# Patient Record
Sex: Female | Born: 1935 | Race: White | Hispanic: No | Smoking: Never smoker
Health system: Southern US, Community
[De-identification: ages and names within clinical notes are randomized; demographics above are authoritative.]

## PROBLEM LIST (undated history)

## (undated) DIAGNOSIS — I639 Cerebral infarction, unspecified: Secondary | ICD-10-CM

## (undated) DIAGNOSIS — E039 Hypothyroidism, unspecified: Secondary | ICD-10-CM

## (undated) DIAGNOSIS — M810 Age-related osteoporosis without current pathological fracture: Secondary | ICD-10-CM

## (undated) DIAGNOSIS — K635 Polyp of colon: Secondary | ICD-10-CM

## (undated) DIAGNOSIS — C50919 Malignant neoplasm of unspecified site of unspecified female breast: Secondary | ICD-10-CM

## (undated) DIAGNOSIS — I10 Essential (primary) hypertension: Secondary | ICD-10-CM

## (undated) DIAGNOSIS — K625 Hemorrhage of anus and rectum: Secondary | ICD-10-CM

## (undated) DIAGNOSIS — M1990 Unspecified osteoarthritis, unspecified site: Secondary | ICD-10-CM

## (undated) DIAGNOSIS — C4490 Unspecified malignant neoplasm of skin, unspecified: Secondary | ICD-10-CM

## (undated) HISTORY — DX: Age-related osteoporosis without current pathological fracture: M81.0

## (undated) HISTORY — PX: CHOLECYSTECTOMY: SHX55

## (undated) HISTORY — PX: MASTECTOMY: SHX3

## (undated) HISTORY — PX: OTHER SURGICAL HISTORY: SHX169

## (undated) HISTORY — PX: REPLACEMENT TOTAL KNEE: SUR1224

## (undated) HISTORY — PX: ABDOMINAL HYSTERECTOMY: SHX81

## (undated) HISTORY — PX: HIP SURGERY: SHX245

## (undated) HISTORY — DX: Unspecified osteoarthritis, unspecified site: M19.90

## (undated) HISTORY — PX: BILATERAL TOTAL MASTECTOMY WITH AXILLARY LYMPH NODE DISSECTION: SHX6364

## (undated) HISTORY — PX: JOINT REPLACEMENT: SHX530

## (undated) HISTORY — PX: CATARACT EXTRACTION: SUR2

## (undated) SURGERY — COLONOSCOPY
Anesthesia: Moderate Sedation | Laterality: Left

---

## 1995-03-18 DIAGNOSIS — C4491 Basal cell carcinoma of skin, unspecified: Secondary | ICD-10-CM

## 1995-03-18 HISTORY — DX: Basal cell carcinoma of skin, unspecified: C44.91

## 1997-09-23 ENCOUNTER — Ambulatory Visit (HOSPITAL_COMMUNITY): Admission: RE | Admit: 1997-09-23 | Discharge: 1997-09-23 | Payer: Self-pay | Admitting: Family Medicine

## 1997-10-27 ENCOUNTER — Ambulatory Visit (HOSPITAL_COMMUNITY): Admission: RE | Admit: 1997-10-27 | Discharge: 1997-10-27 | Payer: Self-pay | Admitting: Family Medicine

## 1999-01-16 ENCOUNTER — Other Ambulatory Visit: Admission: RE | Admit: 1999-01-16 | Discharge: 1999-01-16 | Payer: Self-pay | Admitting: Family Medicine

## 1999-01-31 ENCOUNTER — Ambulatory Visit (HOSPITAL_COMMUNITY): Admission: RE | Admit: 1999-01-31 | Discharge: 1999-01-31 | Payer: Self-pay | Admitting: Family Medicine

## 1999-01-31 ENCOUNTER — Encounter: Payer: Self-pay | Admitting: Family Medicine

## 1999-07-24 ENCOUNTER — Other Ambulatory Visit: Admission: RE | Admit: 1999-07-24 | Discharge: 1999-07-24 | Payer: Self-pay | Admitting: Family Medicine

## 1999-09-04 ENCOUNTER — Ambulatory Visit (HOSPITAL_COMMUNITY): Admission: RE | Admit: 1999-09-04 | Discharge: 1999-09-04 | Payer: Self-pay | Admitting: Urology

## 1999-09-04 ENCOUNTER — Encounter (INDEPENDENT_AMBULATORY_CARE_PROVIDER_SITE_OTHER): Payer: Self-pay

## 1999-10-10 ENCOUNTER — Ambulatory Visit (HOSPITAL_COMMUNITY): Admission: RE | Admit: 1999-10-10 | Discharge: 1999-10-10 | Payer: Self-pay | Admitting: Family Medicine

## 1999-10-10 ENCOUNTER — Encounter: Payer: Self-pay | Admitting: Family Medicine

## 2000-01-29 ENCOUNTER — Other Ambulatory Visit: Admission: RE | Admit: 2000-01-29 | Discharge: 2000-01-29 | Payer: Self-pay | Admitting: *Deleted

## 2000-03-21 ENCOUNTER — Other Ambulatory Visit: Admission: RE | Admit: 2000-03-21 | Discharge: 2000-03-21 | Payer: Self-pay | Admitting: Gynecology

## 2000-03-21 ENCOUNTER — Encounter (INDEPENDENT_AMBULATORY_CARE_PROVIDER_SITE_OTHER): Payer: Self-pay | Admitting: Specialist

## 2000-05-02 ENCOUNTER — Ambulatory Visit (HOSPITAL_COMMUNITY): Admission: RE | Admit: 2000-05-02 | Discharge: 2000-05-02 | Payer: Self-pay | Admitting: Gynecology

## 2000-05-02 ENCOUNTER — Encounter (INDEPENDENT_AMBULATORY_CARE_PROVIDER_SITE_OTHER): Payer: Self-pay

## 2000-05-13 ENCOUNTER — Other Ambulatory Visit: Admission: RE | Admit: 2000-05-13 | Discharge: 2000-05-13 | Payer: Self-pay | Admitting: Gynecology

## 2000-05-13 ENCOUNTER — Encounter (INDEPENDENT_AMBULATORY_CARE_PROVIDER_SITE_OTHER): Payer: Self-pay | Admitting: Specialist

## 2000-08-05 ENCOUNTER — Other Ambulatory Visit: Admission: RE | Admit: 2000-08-05 | Discharge: 2000-08-05 | Payer: Self-pay | Admitting: Gynecology

## 2000-11-04 ENCOUNTER — Other Ambulatory Visit: Admission: RE | Admit: 2000-11-04 | Discharge: 2000-11-04 | Payer: Self-pay | Admitting: Gynecology

## 2001-01-07 ENCOUNTER — Encounter: Payer: Self-pay | Admitting: Orthopedic Surgery

## 2001-01-07 ENCOUNTER — Encounter: Admission: RE | Admit: 2001-01-07 | Discharge: 2001-01-07 | Payer: Self-pay | Admitting: Orthopedic Surgery

## 2001-01-21 ENCOUNTER — Encounter: Admission: RE | Admit: 2001-01-21 | Discharge: 2001-01-21 | Payer: Self-pay | Admitting: Orthopedic Surgery

## 2001-01-21 ENCOUNTER — Encounter: Payer: Self-pay | Admitting: Orthopedic Surgery

## 2001-02-02 ENCOUNTER — Encounter: Admission: RE | Admit: 2001-02-02 | Discharge: 2001-02-02 | Payer: Self-pay | Admitting: Orthopedic Surgery

## 2001-02-02 ENCOUNTER — Encounter: Payer: Self-pay | Admitting: Orthopedic Surgery

## 2001-02-05 ENCOUNTER — Other Ambulatory Visit: Admission: RE | Admit: 2001-02-05 | Discharge: 2001-02-05 | Payer: Self-pay | Admitting: Gynecology

## 2001-05-08 ENCOUNTER — Other Ambulatory Visit: Admission: RE | Admit: 2001-05-08 | Discharge: 2001-05-08 | Payer: Self-pay | Admitting: Gynecology

## 2001-10-07 ENCOUNTER — Encounter: Admission: RE | Admit: 2001-10-07 | Discharge: 2001-10-07 | Payer: Self-pay | Admitting: Orthopedic Surgery

## 2001-10-07 ENCOUNTER — Encounter: Payer: Self-pay | Admitting: Orthopedic Surgery

## 2001-11-12 ENCOUNTER — Other Ambulatory Visit: Admission: RE | Admit: 2001-11-12 | Discharge: 2001-11-12 | Payer: Self-pay | Admitting: Gynecology

## 2001-11-17 ENCOUNTER — Encounter: Admission: RE | Admit: 2001-11-17 | Discharge: 2001-11-17 | Payer: Self-pay | Admitting: Gynecology

## 2001-11-17 ENCOUNTER — Encounter: Payer: Self-pay | Admitting: Gynecology

## 2002-02-19 ENCOUNTER — Encounter: Payer: Self-pay | Admitting: Ophthalmology

## 2002-02-23 ENCOUNTER — Ambulatory Visit (HOSPITAL_COMMUNITY): Admission: RE | Admit: 2002-02-23 | Discharge: 2002-02-23 | Payer: Self-pay | Admitting: Ophthalmology

## 2002-07-19 ENCOUNTER — Encounter: Admission: RE | Admit: 2002-07-19 | Discharge: 2002-07-19 | Payer: Self-pay | Admitting: Orthopedic Surgery

## 2002-07-19 ENCOUNTER — Encounter: Payer: Self-pay | Admitting: Orthopedic Surgery

## 2002-08-02 ENCOUNTER — Encounter: Admission: RE | Admit: 2002-08-02 | Discharge: 2002-08-02 | Payer: Self-pay | Admitting: Orthopedic Surgery

## 2002-08-02 ENCOUNTER — Encounter: Payer: Self-pay | Admitting: Neurosurgery

## 2002-08-23 ENCOUNTER — Encounter: Payer: Self-pay | Admitting: Orthopedic Surgery

## 2002-08-23 ENCOUNTER — Encounter: Admission: RE | Admit: 2002-08-23 | Discharge: 2002-08-23 | Payer: Self-pay | Admitting: Orthopedic Surgery

## 2002-09-23 DIAGNOSIS — D229 Melanocytic nevi, unspecified: Secondary | ICD-10-CM

## 2002-09-23 HISTORY — DX: Melanocytic nevi, unspecified: D22.9

## 2002-10-29 ENCOUNTER — Encounter: Payer: Self-pay | Admitting: Emergency Medicine

## 2002-10-29 ENCOUNTER — Emergency Department (HOSPITAL_COMMUNITY): Admission: EM | Admit: 2002-10-29 | Discharge: 2002-10-29 | Payer: Self-pay | Admitting: Emergency Medicine

## 2002-11-05 ENCOUNTER — Encounter: Payer: Self-pay | Admitting: Family Medicine

## 2002-11-05 ENCOUNTER — Ambulatory Visit (HOSPITAL_COMMUNITY): Admission: RE | Admit: 2002-11-05 | Discharge: 2002-11-05 | Payer: Self-pay | Admitting: Family Medicine

## 2002-11-15 ENCOUNTER — Other Ambulatory Visit: Admission: RE | Admit: 2002-11-15 | Discharge: 2002-11-15 | Payer: Self-pay | Admitting: Gynecology

## 2002-11-19 ENCOUNTER — Ambulatory Visit (HOSPITAL_COMMUNITY): Admission: RE | Admit: 2002-11-19 | Discharge: 2002-11-19 | Payer: Self-pay | Admitting: Family Medicine

## 2002-12-02 ENCOUNTER — Ambulatory Visit (HOSPITAL_COMMUNITY): Admission: RE | Admit: 2002-12-02 | Discharge: 2002-12-02 | Payer: Self-pay | Admitting: Family Medicine

## 2002-12-02 ENCOUNTER — Encounter: Payer: Self-pay | Admitting: Family Medicine

## 2003-01-05 ENCOUNTER — Encounter (INDEPENDENT_AMBULATORY_CARE_PROVIDER_SITE_OTHER): Payer: Self-pay | Admitting: Specialist

## 2003-01-05 ENCOUNTER — Observation Stay (HOSPITAL_COMMUNITY): Admission: RE | Admit: 2003-01-05 | Discharge: 2003-01-06 | Payer: Self-pay | Admitting: General Surgery

## 2003-01-05 ENCOUNTER — Encounter: Payer: Self-pay | Admitting: General Surgery

## 2003-03-23 ENCOUNTER — Encounter: Admission: RE | Admit: 2003-03-23 | Discharge: 2003-03-23 | Payer: Self-pay | Admitting: Orthopedic Surgery

## 2003-04-08 ENCOUNTER — Encounter: Admission: RE | Admit: 2003-04-08 | Discharge: 2003-04-08 | Payer: Self-pay | Admitting: Orthopedic Surgery

## 2003-05-11 ENCOUNTER — Encounter: Admission: RE | Admit: 2003-05-11 | Discharge: 2003-05-11 | Payer: Self-pay | Admitting: Orthopedic Surgery

## 2003-11-18 ENCOUNTER — Encounter: Admission: RE | Admit: 2003-11-18 | Discharge: 2003-11-18 | Payer: Self-pay | Admitting: Orthopedic Surgery

## 2003-12-02 ENCOUNTER — Encounter: Admission: RE | Admit: 2003-12-02 | Discharge: 2003-12-02 | Payer: Self-pay | Admitting: Orthopedic Surgery

## 2003-12-22 ENCOUNTER — Encounter: Admission: RE | Admit: 2003-12-22 | Discharge: 2003-12-22 | Payer: Self-pay | Admitting: Orthopedic Surgery

## 2004-03-06 DIAGNOSIS — C4492 Squamous cell carcinoma of skin, unspecified: Secondary | ICD-10-CM

## 2004-03-06 HISTORY — DX: Squamous cell carcinoma of skin, unspecified: C44.92

## 2004-07-02 ENCOUNTER — Encounter: Admission: RE | Admit: 2004-07-02 | Discharge: 2004-07-02 | Payer: Self-pay | Admitting: Orthopedic Surgery

## 2004-07-17 ENCOUNTER — Encounter: Admission: RE | Admit: 2004-07-17 | Discharge: 2004-07-17 | Payer: Self-pay | Admitting: Orthopedic Surgery

## 2004-08-03 ENCOUNTER — Encounter: Admission: RE | Admit: 2004-08-03 | Discharge: 2004-08-03 | Payer: Self-pay | Admitting: Orthopedic Surgery

## 2004-09-28 ENCOUNTER — Inpatient Hospital Stay (HOSPITAL_COMMUNITY): Admission: EM | Admit: 2004-09-28 | Discharge: 2004-10-08 | Payer: Self-pay | Admitting: Emergency Medicine

## 2004-09-28 ENCOUNTER — Ambulatory Visit: Payer: Self-pay | Admitting: Physical Medicine & Rehabilitation

## 2004-09-28 ENCOUNTER — Encounter: Admission: RE | Admit: 2004-09-28 | Discharge: 2004-09-28 | Payer: Self-pay | Admitting: Orthopedic Surgery

## 2005-01-22 ENCOUNTER — Other Ambulatory Visit: Admission: RE | Admit: 2005-01-22 | Discharge: 2005-01-22 | Payer: Self-pay | Admitting: Gynecology

## 2005-04-12 ENCOUNTER — Inpatient Hospital Stay (HOSPITAL_COMMUNITY): Admission: RE | Admit: 2005-04-12 | Discharge: 2005-04-16 | Payer: Self-pay | Admitting: Orthopedic Surgery

## 2005-04-12 ENCOUNTER — Ambulatory Visit: Payer: Self-pay | Admitting: Physical Medicine & Rehabilitation

## 2005-04-16 ENCOUNTER — Inpatient Hospital Stay
Admission: RE | Admit: 2005-04-16 | Discharge: 2005-04-23 | Payer: Self-pay | Admitting: Physical Medicine & Rehabilitation

## 2005-09-18 ENCOUNTER — Emergency Department (HOSPITAL_COMMUNITY): Admission: EM | Admit: 2005-09-18 | Discharge: 2005-09-19 | Payer: Self-pay | Admitting: Emergency Medicine

## 2006-01-27 ENCOUNTER — Other Ambulatory Visit: Admission: RE | Admit: 2006-01-27 | Discharge: 2006-01-27 | Payer: Self-pay | Admitting: Gynecology

## 2006-05-05 ENCOUNTER — Encounter: Admission: RE | Admit: 2006-05-05 | Discharge: 2006-05-05 | Payer: Self-pay | Admitting: Orthopedic Surgery

## 2006-05-28 ENCOUNTER — Encounter: Admission: RE | Admit: 2006-05-28 | Discharge: 2006-05-28 | Payer: Self-pay | Admitting: Orthopedic Surgery

## 2006-06-10 ENCOUNTER — Encounter: Admission: RE | Admit: 2006-06-10 | Discharge: 2006-06-10 | Payer: Self-pay | Admitting: Orthopedic Surgery

## 2006-12-18 ENCOUNTER — Encounter: Admission: RE | Admit: 2006-12-18 | Discharge: 2006-12-18 | Payer: Self-pay | Admitting: Orthopedic Surgery

## 2007-01-22 ENCOUNTER — Encounter: Admission: RE | Admit: 2007-01-22 | Discharge: 2007-01-22 | Payer: Self-pay | Admitting: Orthopedic Surgery

## 2007-02-06 ENCOUNTER — Encounter: Admission: RE | Admit: 2007-02-06 | Discharge: 2007-02-06 | Payer: Self-pay | Admitting: Orthopedic Surgery

## 2007-05-04 DIAGNOSIS — C4491 Basal cell carcinoma of skin, unspecified: Secondary | ICD-10-CM

## 2007-05-04 HISTORY — DX: Basal cell carcinoma of skin, unspecified: C44.91

## 2007-11-19 ENCOUNTER — Encounter: Admission: RE | Admit: 2007-11-19 | Discharge: 2007-11-19 | Payer: Self-pay | Admitting: Orthopedic Surgery

## 2007-12-08 ENCOUNTER — Encounter: Admission: RE | Admit: 2007-12-08 | Discharge: 2007-12-08 | Payer: Self-pay | Admitting: Orthopedic Surgery

## 2007-12-22 ENCOUNTER — Encounter: Admission: RE | Admit: 2007-12-22 | Discharge: 2007-12-22 | Payer: Self-pay | Admitting: Orthopedic Surgery

## 2008-01-26 ENCOUNTER — Emergency Department (HOSPITAL_COMMUNITY): Admission: EM | Admit: 2008-01-26 | Discharge: 2008-01-26 | Payer: Self-pay | Admitting: Emergency Medicine

## 2008-01-27 ENCOUNTER — Ambulatory Visit (HOSPITAL_COMMUNITY): Admission: RE | Admit: 2008-01-27 | Discharge: 2008-01-27 | Payer: Self-pay | Admitting: Emergency Medicine

## 2008-02-16 ENCOUNTER — Ambulatory Visit (HOSPITAL_COMMUNITY): Admission: RE | Admit: 2008-02-16 | Discharge: 2008-02-16 | Payer: Self-pay | Admitting: Orthopedic Surgery

## 2008-02-16 ENCOUNTER — Ambulatory Visit: Payer: Self-pay | Admitting: Surgery

## 2008-02-16 ENCOUNTER — Encounter (INDEPENDENT_AMBULATORY_CARE_PROVIDER_SITE_OTHER): Payer: Self-pay | Admitting: Orthopedic Surgery

## 2008-02-18 ENCOUNTER — Encounter: Admission: RE | Admit: 2008-02-18 | Discharge: 2008-02-18 | Payer: Self-pay | Admitting: Orthopedic Surgery

## 2008-06-14 ENCOUNTER — Encounter: Admission: RE | Admit: 2008-06-14 | Discharge: 2008-06-14 | Payer: Self-pay | Admitting: Orthopedic Surgery

## 2008-06-21 ENCOUNTER — Ambulatory Visit: Payer: Self-pay | Admitting: Vascular Surgery

## 2008-06-28 ENCOUNTER — Encounter: Admission: RE | Admit: 2008-06-28 | Discharge: 2008-06-28 | Payer: Self-pay | Admitting: Orthopedic Surgery

## 2008-07-18 ENCOUNTER — Encounter: Admission: RE | Admit: 2008-07-18 | Discharge: 2008-07-18 | Payer: Self-pay | Admitting: Orthopedic Surgery

## 2008-07-27 ENCOUNTER — Inpatient Hospital Stay (HOSPITAL_COMMUNITY): Admission: RE | Admit: 2008-07-27 | Discharge: 2008-08-01 | Payer: Self-pay | Admitting: Orthopedic Surgery

## 2008-08-01 ENCOUNTER — Inpatient Hospital Stay: Admission: RE | Admit: 2008-08-01 | Discharge: 2008-08-12 | Payer: Self-pay | Admitting: Internal Medicine

## 2008-12-16 ENCOUNTER — Encounter: Admission: RE | Admit: 2008-12-16 | Discharge: 2008-12-16 | Payer: Self-pay | Admitting: Orthopedic Surgery

## 2009-01-12 ENCOUNTER — Encounter: Admission: RE | Admit: 2009-01-12 | Discharge: 2009-01-12 | Payer: Self-pay | Admitting: Orthopedic Surgery

## 2009-01-27 ENCOUNTER — Encounter: Admission: RE | Admit: 2009-01-27 | Discharge: 2009-01-27 | Payer: Self-pay | Admitting: Orthopedic Surgery

## 2009-06-26 ENCOUNTER — Encounter: Admission: RE | Admit: 2009-06-26 | Discharge: 2009-06-26 | Payer: Self-pay | Admitting: Orthopedic Surgery

## 2009-07-18 ENCOUNTER — Encounter: Admission: RE | Admit: 2009-07-18 | Discharge: 2009-07-18 | Payer: Self-pay | Admitting: Orthopedic Surgery

## 2009-08-03 ENCOUNTER — Encounter: Admission: RE | Admit: 2009-08-03 | Discharge: 2009-08-03 | Payer: Self-pay | Admitting: Orthopedic Surgery

## 2009-11-19 ENCOUNTER — Ambulatory Visit (HOSPITAL_COMMUNITY): Admission: RE | Admit: 2009-11-19 | Discharge: 2009-11-19 | Payer: Self-pay | Admitting: Family Medicine

## 2009-11-20 ENCOUNTER — Ambulatory Visit: Payer: Self-pay | Admitting: Vascular Surgery

## 2009-11-20 ENCOUNTER — Encounter (INDEPENDENT_AMBULATORY_CARE_PROVIDER_SITE_OTHER): Payer: Self-pay | Admitting: Internal Medicine

## 2010-01-02 ENCOUNTER — Ambulatory Visit: Payer: Self-pay | Admitting: Thoracic Surgery

## 2010-01-04 ENCOUNTER — Ambulatory Visit (HOSPITAL_COMMUNITY): Admission: RE | Admit: 2010-01-04 | Discharge: 2010-01-04 | Payer: Self-pay | Admitting: Thoracic Surgery

## 2010-01-09 ENCOUNTER — Ambulatory Visit: Payer: Self-pay | Admitting: Thoracic Surgery

## 2010-02-01 ENCOUNTER — Ambulatory Visit: Payer: Self-pay | Admitting: Vascular Surgery

## 2010-04-12 ENCOUNTER — Inpatient Hospital Stay (HOSPITAL_COMMUNITY): Admission: EM | Admit: 2010-04-12 | Discharge: 2009-11-24 | Payer: Self-pay | Admitting: Emergency Medicine

## 2010-05-24 ENCOUNTER — Encounter
Admission: RE | Admit: 2010-05-24 | Discharge: 2010-05-24 | Payer: Self-pay | Source: Home / Self Care | Attending: Orthopedic Surgery | Admitting: Orthopedic Surgery

## 2010-05-26 ENCOUNTER — Other Ambulatory Visit: Payer: Self-pay | Admitting: Orthopedic Surgery

## 2010-05-26 DIAGNOSIS — M545 Low back pain, unspecified: Secondary | ICD-10-CM

## 2010-06-07 ENCOUNTER — Other Ambulatory Visit: Payer: Self-pay | Admitting: Orthopedic Surgery

## 2010-06-07 ENCOUNTER — Ambulatory Visit
Admission: RE | Admit: 2010-06-07 | Discharge: 2010-06-07 | Disposition: A | Discharge status: Home / Self Care | Payer: No Typology Code available for payment source | Source: Ambulatory Visit | Attending: Orthopedic Surgery | Admitting: Orthopedic Surgery

## 2010-06-07 DIAGNOSIS — M545 Low back pain, unspecified: Secondary | ICD-10-CM

## 2010-06-07 DIAGNOSIS — G8929 Other chronic pain: Secondary | ICD-10-CM

## 2010-06-21 ENCOUNTER — Ambulatory Visit
Admission: RE | Admit: 2010-06-21 | Discharge: 2010-06-21 | Disposition: A | Discharge status: Home / Self Care | Payer: No Typology Code available for payment source | Source: Ambulatory Visit | Attending: Orthopedic Surgery | Admitting: Orthopedic Surgery

## 2010-06-21 DIAGNOSIS — M545 Low back pain, unspecified: Secondary | ICD-10-CM

## 2010-06-21 DIAGNOSIS — G8929 Other chronic pain: Secondary | ICD-10-CM

## 2010-06-27 ENCOUNTER — Other Ambulatory Visit: Payer: Self-pay | Admitting: Thoracic Surgery

## 2010-06-27 DIAGNOSIS — R911 Solitary pulmonary nodule: Secondary | ICD-10-CM

## 2010-07-06 ENCOUNTER — Emergency Department (HOSPITAL_COMMUNITY): Payer: Medicare Other

## 2010-07-06 ENCOUNTER — Observation Stay (HOSPITAL_COMMUNITY)
Admission: EM | Admit: 2010-07-06 | Discharge: 2010-07-07 | DRG: 999 | Disposition: A | Discharge status: Home / Self Care | Payer: Medicare Other | Attending: Internal Medicine | Admitting: Internal Medicine

## 2010-07-06 DIAGNOSIS — Z86711 Personal history of pulmonary embolism: Secondary | ICD-10-CM | POA: Insufficient documentation

## 2010-07-06 DIAGNOSIS — R82998 Other abnormal findings in urine: Secondary | ICD-10-CM | POA: Insufficient documentation

## 2010-07-06 DIAGNOSIS — M4120 Other idiopathic scoliosis, site unspecified: Secondary | ICD-10-CM | POA: Insufficient documentation

## 2010-07-06 DIAGNOSIS — E785 Hyperlipidemia, unspecified: Secondary | ICD-10-CM | POA: Insufficient documentation

## 2010-07-06 DIAGNOSIS — G894 Chronic pain syndrome: Secondary | ICD-10-CM | POA: Insufficient documentation

## 2010-07-06 DIAGNOSIS — E039 Hypothyroidism, unspecified: Secondary | ICD-10-CM | POA: Insufficient documentation

## 2010-07-06 DIAGNOSIS — I10 Essential (primary) hypertension: Secondary | ICD-10-CM | POA: Insufficient documentation

## 2010-07-06 DIAGNOSIS — Z86718 Personal history of other venous thrombosis and embolism: Secondary | ICD-10-CM | POA: Insufficient documentation

## 2010-07-06 DIAGNOSIS — Z96649 Presence of unspecified artificial hip joint: Secondary | ICD-10-CM | POA: Insufficient documentation

## 2010-07-06 DIAGNOSIS — R0789 Other chest pain: Principal | ICD-10-CM | POA: Insufficient documentation

## 2010-07-06 LAB — URINE MICROSCOPIC-ADD ON

## 2010-07-06 LAB — POCT CARDIAC MARKERS
CKMB, poc: 2.3 ng/mL (ref 1.0–8.0)
Myoglobin, poc: 243 ng/mL (ref 12–200)
Troponin i, poc: <0.05 ng/mL (ref 0.00–0.09)

## 2010-07-06 LAB — CBC
HCT: 42.4 % (ref 36.0–46.0)
Hemoglobin: 14 g/dL (ref 12.0–15.0)
MCH: 30.7 pg (ref 26.0–34.0)
MCHC: 33 g/dL (ref 30.0–36.0)
MCV: 93 fL (ref 78.0–100.0)
Platelets: 174 10*3/uL (ref 150–400)
RBC: 4.56 MIL/uL (ref 3.87–5.11)
RDW: 14.1 % (ref 11.5–15.5)
WBC: 9.8 10*3/uL (ref 4.0–10.5)

## 2010-07-06 LAB — DIFFERENTIAL
Basophils Absolute: 0 10*3/uL (ref 0.0–0.1)
Basophils Relative: 0 % (ref 0–1)
Eosinophils Absolute: 0 10*3/uL (ref 0.0–0.7)
Eosinophils Relative: 0 % (ref 0–5)
Lymphocytes Relative: 14 % (ref 12–46)
Lymphs Abs: 1.4 10*3/uL (ref 0.7–4.0)
Monocytes Absolute: 0.9 10*3/uL (ref 0.1–1.0)
Monocytes Relative: 9 % (ref 3–12)
Neutro Abs: 7.5 10*3/uL (ref 1.7–7.7)
Neutrophils Relative %: 77 % (ref 43–77)

## 2010-07-06 LAB — URINALYSIS, ROUTINE W REFLEX MICROSCOPIC
Bilirubin Urine: NEGATIVE
Glucose, UA: NEGATIVE mg/dL
Hgb urine dipstick: NEGATIVE
Ketones, ur: NEGATIVE mg/dL
Nitrite: NEGATIVE
Protein, ur: NEGATIVE mg/dL
Specific Gravity, Urine: 1.017 (ref 1.005–1.030)
Urobilinogen, UA: 0.2 mg/dL (ref 0.0–1.0)
pH: 6 (ref 5.0–8.0)

## 2010-07-06 LAB — BASIC METABOLIC PANEL
BUN: 25 mg/dL — ABNORMAL HIGH (ref 6–23)
CO2: 27 mEq/L (ref 19–32)
Calcium: 9.7 mg/dL (ref 8.4–10.5)
Chloride: 104 mEq/L (ref 96–112)
Creatinine, Ser: 1.35 mg/dL — ABNORMAL HIGH (ref 0.4–1.2)
GFR calc Af Amer: 46 mL/min — ABNORMAL LOW (ref >60)
GFR calc non Af Amer: 38 mL/min — ABNORMAL LOW (ref >60)
Glucose, Bld: 143 mg/dL — ABNORMAL HIGH (ref 70–99)
Potassium: 4 mEq/L (ref 3.5–5.1)
Sodium: 140 mEq/L (ref 135–145)

## 2010-07-06 LAB — CK TOTAL AND CKMB (NOT AT ARMC)
CK, MB: 2.1 ng/mL (ref 0.3–4.0)
Relative Index: 2.1 (ref 0.0–2.5)
Total CK: 100 U/L (ref 7–177)

## 2010-07-06 LAB — TROPONIN I: Troponin I: 0.01 ng/mL (ref 0.00–0.06)

## 2010-07-06 MED ORDER — TECHNETIUM TO 99M ALBUMIN AGGREGATED
6.0000 | Freq: Once | INTRAVENOUS | Status: AC | PRN
  Administered 2010-07-06: 6.4 via INTRAVENOUS

## 2010-07-06 MED ORDER — XENON XE 133 GAS
10.0000 | GAS_FOR_INHALATION | Freq: Once | RESPIRATORY_TRACT | Status: AC | PRN
  Administered 2010-07-06: 10.6 via RESPIRATORY_TRACT

## 2010-07-07 LAB — TSH: TSH: 1.474 u[IU]/mL (ref 0.350–4.500)

## 2010-07-07 LAB — BASIC METABOLIC PANEL
BUN: 23 mg/dL (ref 6–23)
CO2: 25 mEq/L (ref 19–32)
Calcium: 8.9 mg/dL (ref 8.4–10.5)
Chloride: 107 mEq/L (ref 96–112)
Creatinine, Ser: 1.09 mg/dL (ref 0.4–1.2)
GFR calc Af Amer: 58 mL/min — ABNORMAL LOW (ref >60)
GFR calc non Af Amer: 48 mL/min — ABNORMAL LOW (ref >60)
Glucose, Bld: 136 mg/dL — ABNORMAL HIGH (ref 70–99)
Potassium: 4.1 mEq/L (ref 3.5–5.1)
Sodium: 140 mEq/L (ref 135–145)

## 2010-07-07 LAB — HEMOGLOBIN A1C
Hgb A1c MFr Bld: 6.9 % — ABNORMAL HIGH (ref <5.7)
Mean Plasma Glucose: 151 mg/dL — ABNORMAL HIGH (ref <117)

## 2010-07-07 LAB — CBC
HCT: 39.1 % (ref 36.0–46.0)
Hemoglobin: 12.5 g/dL (ref 12.0–15.0)
MCH: 30.1 pg (ref 26.0–34.0)
MCHC: 32 g/dL (ref 30.0–36.0)
MCV: 94.2 fL (ref 78.0–100.0)
Platelets: 174 10*3/uL (ref 150–400)
RBC: 4.15 MIL/uL (ref 3.87–5.11)
RDW: 14.2 % (ref 11.5–15.5)
WBC: 8.6 10*3/uL (ref 4.0–10.5)

## 2010-07-07 LAB — LIPID PANEL
Cholesterol: 192 mg/dL (ref 0–200)
HDL: 102 mg/dL (ref >39)
LDL Cholesterol: 79 mg/dL (ref 0–99)
Total CHOL/HDL Ratio: 1.9 RATIO
Triglycerides: 54 mg/dL (ref <150)
VLDL: 11 mg/dL (ref 0–40)

## 2010-07-07 LAB — CARDIAC PANEL(CRET KIN+CKTOT+MB+TROPI)
CK, MB: 1.7 ng/mL (ref 0.3–4.0)
Relative Index: INVALID (ref 0.0–2.5)
Total CK: 90 U/L (ref 7–177)
Troponin I: <0.01 ng/mL (ref 0.00–0.06)

## 2010-07-07 NOTE — Discharge Summary (Signed)
NAME:  Tiffany [name], Tiffany [name] NO.:  [id number]  MEDICAL RECORD NO.:  [id number]           PATIENT TYPE:  I  LOCATION:  3733                         FACILITY:  [facility]  PHYSICIAN:  Talmage Nap, MD  DATE OF BIRTH:  1926/07/22  DATE OF ADMISSION:  07/06/2010 DATE OF DISCHARGE:  07/07/2010                        DISCHARGE SUMMARY - REFERRING   PRIMARY CARE PHYSICIAN:  Deatra James, MD  DISCHARGE DIAGNOSES: 1. Atypical chest pain, negative cardiac enzyme.  V/Q scan showed low     probability for pulmonary embolism. 2. Hypertension. 3. History of deep venous thrombosis. 4. History of right lower lobe acute pulmonary embolism. 5. Hypothyroidism. 6. Chronic pain syndrome. 7. History of arthritis of the right knee. 8. Asymptomatic bacteriuria. 9. History of fibrocystic breast disease. 10.Scoliosis. 11.History of bronchitis.  The patient is an 77 year old Caucasian female who was admitted to the hospital on July 06, 2010 by Dr. Thad Ranger with atypical chest pain. Pain was said to have started on the left side of the back then radiating to the chest with no associated nausea or vomiting.  No diaphoresis.  No palpitations.  No shortness of breath.  At the time the patient was seen by the admitting physician, pain was said to have resolved.  She was however admitted to rule out for acute coronary syndrome.  Her preadmission meds include: 1. Synthroid 75 mcg p.o. daily. 2. Percocet 5/325 one to two tablets p.o. q.6 p.r.n. 3. Metoprolol 50 mg 1 p.o. b.i.d. 4. Calcium carbonate 1200 mg 1 p.o. daily. 5. Avapro (irbesartan) 300 mg 1 p.o. daily.  ALLERGIES:  SULFA, MORPHINE SULFATE, CODEINE, IBUPROFEN and ADHESIVES.  PAST SURGICAL HISTORY: 1. Left hip replacement with subsequent 4 revisions. 2. Cholecystectomy. 3. Right total knee arthroplasty. 4. Hysterectomy.  SOCIAL/FAMILY HISTORY:  Not documented.  REVIEW OF SYSTEMS:  Essentially documented in initial history  and physical at the time patient was seen by the admitting physician.  PHYSICAL EXAMINATION:  VITAL SIGNS:  Blood pressure is 149/84, pulse 90, respiratory rate 20, temperature is 97.7. GENERAL:  She was said to be alert, oriented, not in any respiratory distress. HEENT:  Pupils equal and reactive to light and extraocular muscles are intact. NECK:  No jugular venous distention.  No carotid bruits.  No lymphadenopathy. CHEST:  Clear to auscultation.  Heart sounds are 1 and 2. ABDOMEN:  Soft, nontender.  Liver, spleen, kidney not palpable.  Bowel sounds are positive.  EXTREMITIES:  No pedal edema. NEUROLOGIC:  Nonfocal. MUSCULOSKELETAL SYSTEM:  Arthritic changes in the knees and feet. NEUROPSYCHIATRIC:  Unremarkable.  LABORATORY DATA:  Initial complete blood count with differential showed WBC of 9.8, hemoglobin of 14.0, hematocrit of 42.4, MCV of 93.0 with a platelet count of 174, normal differentials.  Cardiac markers:  Troponin I less than 0.05, 0.01, and less than 0.01 respectively.  Basic metabolic panel showed sodium of 140, potassium of 4.0, chloride of 104, with a bicarb of 27, glucose is 143, BUN is 25, creatinine is 1.35. Urinalysis showed moderate leukocyte esterase with negative nitrite. Urine microscopy showed wbc 11-20 with few bacteria, hemoglobin A1c 6.9. Thyroid panel TSH 1.474 normal.  Lipid panel  essentially normal.  Repeat complete blood count with no differential done on July 07, 2010 showed WBC of 8.6, hemoglobin of 12.5, hematocrit 39.1, MCV of 94.2 with a platelet count of 174.  Basic metabolic panel done on July 07, 2010 showed sodium of 140, potassium of 4.1, chloride of 107, bicarb of 25, glucose is 136, BUN is 23, and creatinine is 1.09.  EKG on admission was said to have showed normal sinus rhythm with a rate of 76, no significant ST-wave change.  Imaging studies done include chest x-ray, essentially normal.  V/Q scan showed low probability for  PE.  HOSPITAL COURSE:  The patient was admitted to telemetry.  She was given Avapro 300 mg p.o. daily, Lopressor 50 mg p.o. b.i.d. to be held if systolic blood pressure is less than 100 and also started on Synthroid 75 mcg p.o. daily.  Other medication given to the patient included aspirin 81 mg p.o. daily, Tylenol, oxycodone for pain control, and Zofran IV for nausea.  The patient was also given Lovenox 30 mg subcu q.24 for DVT prophylaxis.  The patient was seen by me but the very first time this admission on July 07, 2010, denied any chest pain or shortness of breath.  No abdominal discomfort.  No dysuria, hematuria, and examination of the patient was essentially unremarkable.  Her vital signs:  Blood pressure is 133/71, temperature is 98.4, pulse 82, respiratory rate 16, medically stable.  The patient is to be discharged home today on activity as tolerated. Low-sodium, low-cholesterol diet.  Follow up with her primary care physician in 1-2 weeks.  Medication to be taken at home will include: 1. Aspirin 81 mg p.o. daily. 2. Levaquin 500 mg 1 p.o. daily for 3 days for treatment of     asymptomatic bacteriuria. 3. Nitro 0.4 mg sublingual p.r.n. for chest pain. 4. Avapro (irbesartan) 300 mg 1 p.o. daily. 5. Calcium carbonate 1200 mg one p.o. daily. 6. Metoprolol tartrate 50 mg 1 p.o. b.i.d. 7. Percocet 5/325 one to two tablets p.o. q.6 p.r.n. 8. Synthroid (levothyroxine) 75 mcg 1 p.o. daily.     Talmage Nap, MD     CN/MEDQ  D:  07/07/2010  T:  07/07/2010  Job:  [id number]  cc:   Deatra James, M.D.  Electronically Signed by Talmage Nap  on 07/07/2010 11:58:22 AM

## 2010-07-08 LAB — URINE CULTURE
Colony Count: 75000
Culture  Setup Time: 201203022220

## 2010-07-09 ENCOUNTER — Other Ambulatory Visit: Payer: Self-pay | Admitting: Obstetrics

## 2010-07-18 NOTE — H&P (Signed)
NAME:  Tiffany [name], Tiffany [name] NO.:  [id number]  MEDICAL RECORD NO.:  [id number]           PATIENT TYPE:  E  LOCATION:  [facility]                         FACILITY:  [facility]  PHYSICIAN:  Thad Ranger, MD       DATE OF BIRTH:  [birth year]  DATE OF ADMISSION:  07/06/2010 DATE OF DISCHARGE:                             HISTORY & PHYSICAL   PRIMARY CARE PHYSICIAN:  Deatra James, MD  CHIEF COMPLAINT:  Chest pain.  HISTORY OF PRESENT ILLNESS:  Tiffany [name] is an 77 year old female with history of right lung PE and DVT in July 2011, history of hypothyroidism, hypertension, history of osteoarthritis, who presented to the [facility] Emergency Room with chief complaint of back pain radiating to the chest.  History was provided by the patient.  The patient stated that she woke up with left-sided back pain which was radiating to the chest this morning and she had no nausea or vomiting, any diaphoresis or palpitations or any shortness of breath.  The patient stated that it came in spasms and resolved on its own.  The patient got very concerned because she had similar symptoms in July 2011 when she was diagnosed with right lung pulmonary embolism.  The patient stated that she went to [facility] just for the quick evaluation, although her primary care physician is Dr. Deatra James.  The patient stated that they did chest x-ray and EKG which was fine but felt that she needed to be evaluated for chest pain.  At the time of my encounter, chest pain had completely resolved.  The patient was admitted for evaluation.  Otherwise, at this time she denies any other complaints; however, she has some headache though while being in the emergency room.  PAST MEDICAL HISTORY: 1. History of hypertension. 2. History of left lower extremity DVT in July 2011. 3. History of right lower lobe acute pulmonary embolism in July 2011.     The patient stated that she finished her Coumadin after 6 months on  May 08, 2010. 4. Hypothyroidism. 5. Chronic pain syndrome. 6. Osteoarthritis of the right knee. 7. Hypertension. 8. History of fibrocystic breast disease. 9. Scoliosis. 10.History of bronchitis. 11.Dyslipidemia.  SURGERIES: 1. Left hip replacement surgery with four revisions. 2. Cholecystectomy. 3. Right total knee arthroplasty. 4. History of hysterectomy.  ALLERGIES:  SULFA, MORPHINE, CODEINE, IBUPROFEN, and ADHESIVE.  MEDICATIONS PRIOR TO ADMISSION: 1. Avapro 300 mg p.o. q.a.m. 2. Lomotil p.r.n. 3. Oxycodone/acetaminophen p.o. q.a.m. 4. Synthroid 75 mcg p.o. q.a.m. 5. Lopressor 50 mg p.o. b.i.d.  PHYSICAL EXAMINATION:  VITAL SIGNS:  Blood pressure 149/84, pulse rate 90, respiratory rate 20, temperature 97.7. GENERAL: The patient is alert, awake, and oriented x3, not in acute distress. HEENT:  Anicteric sclerae.  Pink conjunctivae.  Pupils are reactive to light and accommodation.  EOMI. NECK:  Supple.  No lymphopathy.  No JVD. CVS:  S1 and S2 clear.  Regular rate and rhythm. CHEST:  Fairly clear to auscultation bilaterally.  No chest pain currently. ABDOMEN:  Soft, nontender, nondistended.  Normal bowel sounds. EXTREMITIES:  No cyanosis, clubbing, or edema.  Well-healed scar on the right knee.  NEUROLOGIC:  No focal neurological deficits noted.  LABORATORY/DIAGNOSTIC DATA:  BMET, sodium 140, potassium 4.0, glucose 143, BUN 25, creatinine 1.35, calcium 9.7.  Cardiac markers, myoglobin 243, troponin less than 0.05.  CBC, white count 9.8, hemoglobin 14.0, hematocrit 42.4, platelets of 174,000.  RADIOLOGICAL DATA:  Chest x-ray two-view on July 06, 2010, chronic changes, no active disease.  VQ scan, July 06, 2010, very low probability of pulmonary embolism.  EKG, rate 76, normal sinus rhythm. No significant ST-T wave changes.  IMPRESSION AND PLAN:  Tiffany [name] is an 77 year old female with a history of prior pulmonary embolus and deep vein thrombosis, hypertension,  hypothyroidism, off the Coumadin since June 04, 2010, presents with atypical chest pain, currently resolved. 1. Atypical chest pain.  Differential diagnosis include cardiac versus     costochondritis or musculoskeletal.  Pulmonary embolism already has     been ruled out by negative VQ scan.  The patient was admitted for     observation on the telemonitor floor and ruled out for acute ACS.     Obtain 2-D echocardiogram for further workup. 2. Hypotension.  Restart Avapro and metoprolol per her home regimen. 3. Hyperglycemia.  Obtain HbA1c to assess if the patient has diabetes. 4. Hyperthyroidism.  Obtain TSH and continue Synthroid. 5. Chronic pain syndrome.  Continue pain medications as needed.  The     patient wants physical therapy evaluation     as she has multiple revisions of her hip replacement and has been     having some difficulty ambulating.  We will place PT/OT consult for     the patient. 6. Prophylaxis.  Lovenox for DVT prophylaxis.     Thad Ranger, MD     RR/MEDQ  D:  07/06/2010  T:  07/06/2010  Job:  [id number]  cc:   Deatra James, M.D.  Electronically Signed by Bonney Berres  on 07/18/2010 07:52:41 AM

## 2010-07-19 LAB — GLUCOSE, CAPILLARY: Glucose-Capillary: 126 mg/dL — ABNORMAL HIGH (ref 70–99)

## 2010-07-21 LAB — COMPREHENSIVE METABOLIC PANEL
ALT: 13 U/L (ref 0–35)
AST: 16 U/L (ref 0–37)
Albumin: 3 g/dL — ABNORMAL LOW (ref 3.5–5.2)
Alkaline Phosphatase: 66 U/L (ref 39–117)
BUN: 12 mg/dL (ref 6–23)
CO2: 26 mEq/L (ref 19–32)
Calcium: 8.7 mg/dL (ref 8.4–10.5)
Chloride: 105 mEq/L (ref 96–112)
Creatinine, Ser: 1.15 mg/dL (ref 0.4–1.2)
GFR calc Af Amer: 55 mL/min — ABNORMAL LOW (ref >60)
GFR calc non Af Amer: 45 mL/min — ABNORMAL LOW (ref >60)
Glucose, Bld: 133 mg/dL — ABNORMAL HIGH (ref 70–99)
Potassium: 3.9 mEq/L (ref 3.5–5.1)
Sodium: 136 mEq/L (ref 135–145)
Total Bilirubin: 1.3 mg/dL — ABNORMAL HIGH (ref 0.3–1.2)
Total Protein: 5.5 g/dL — ABNORMAL LOW (ref 6.0–8.3)

## 2010-07-21 LAB — DIFFERENTIAL
Basophils Absolute: 0 10*3/uL (ref 0.0–0.1)
Basophils Relative: 0 % (ref 0–1)
Eosinophils Absolute: 0 10*3/uL (ref 0.0–0.7)
Eosinophils Relative: 0 % (ref 0–5)
Lymphocytes Relative: 13 % (ref 12–46)
Lymphs Abs: 1.2 10*3/uL (ref 0.7–4.0)
Monocytes Absolute: 0.9 10*3/uL (ref 0.1–1.0)
Monocytes Relative: 9 % (ref 3–12)
Neutro Abs: 7.2 10*3/uL (ref 1.7–7.7)
Neutrophils Relative %: 78 % — ABNORMAL HIGH (ref 43–77)

## 2010-07-21 LAB — BASIC METABOLIC PANEL
BUN: 13 mg/dL (ref 6–23)
BUN: 14 mg/dL (ref 6–23)
BUN: 14 mg/dL (ref 6–23)
CO2: 24 mEq/L (ref 19–32)
CO2: 24 mEq/L (ref 19–32)
CO2: 25 mEq/L (ref 19–32)
Calcium: 8.7 mg/dL (ref 8.4–10.5)
Calcium: 8.8 mg/dL (ref 8.4–10.5)
Calcium: 8.8 mg/dL (ref 8.4–10.5)
Chloride: 111 mEq/L (ref 96–112)
Chloride: 111 mEq/L (ref 96–112)
Chloride: 114 mEq/L — ABNORMAL HIGH (ref 96–112)
Creatinine, Ser: 1.08 mg/dL (ref 0.4–1.2)
Creatinine, Ser: 1.18 mg/dL (ref 0.4–1.2)
Creatinine, Ser: 1.22 mg/dL — ABNORMAL HIGH (ref 0.4–1.2)
GFR calc Af Amer: 51 mL/min — ABNORMAL LOW (ref >60)
GFR calc Af Amer: 53 mL/min — ABNORMAL LOW (ref >60)
GFR calc Af Amer: 59 mL/min — ABNORMAL LOW (ref >60)
GFR calc non Af Amer: 42 mL/min — ABNORMAL LOW (ref >60)
GFR calc non Af Amer: 44 mL/min — ABNORMAL LOW (ref >60)
GFR calc non Af Amer: 49 mL/min — ABNORMAL LOW (ref >60)
Glucose, Bld: 110 mg/dL — ABNORMAL HIGH (ref 70–99)
Glucose, Bld: 112 mg/dL — ABNORMAL HIGH (ref 70–99)
Glucose, Bld: 114 mg/dL — ABNORMAL HIGH (ref 70–99)
Potassium: 3.9 mEq/L (ref 3.5–5.1)
Potassium: 4 mEq/L (ref 3.5–5.1)
Potassium: 4 mEq/L (ref 3.5–5.1)
Sodium: 141 mEq/L (ref 135–145)
Sodium: 142 mEq/L (ref 135–145)
Sodium: 144 mEq/L (ref 135–145)

## 2010-07-21 LAB — POCT I-STAT, CHEM 8
BUN: 16 mg/dL (ref 6–23)
Calcium, Ion: 1.11 mmol/L — ABNORMAL LOW (ref 1.12–1.32)
Chloride: 103 mEq/L (ref 96–112)
Creatinine, Ser: 1.2 mg/dL (ref 0.4–1.2)
Glucose, Bld: 141 mg/dL — ABNORMAL HIGH (ref 70–99)
HCT: 40 % (ref 36.0–46.0)
Hemoglobin: 13.6 g/dL (ref 12.0–15.0)
Potassium: 4 mEq/L (ref 3.5–5.1)
Sodium: 136 mEq/L (ref 135–145)
TCO2: 24 mmol/L (ref 0–100)

## 2010-07-21 LAB — CBC
HCT: 34.5 % — ABNORMAL LOW (ref 36.0–46.0)
HCT: 36.9 % (ref 36.0–46.0)
HCT: 38.2 % (ref 36.0–46.0)
Hemoglobin: 11.8 g/dL — ABNORMAL LOW (ref 12.0–15.0)
Hemoglobin: 12.3 g/dL (ref 12.0–15.0)
Hemoglobin: 13 g/dL (ref 12.0–15.0)
MCH: 30.9 pg (ref 26.0–34.0)
MCH: 31.4 pg (ref 26.0–34.0)
MCH: 31.4 pg (ref 26.0–34.0)
MCHC: 33.4 g/dL (ref 30.0–36.0)
MCHC: 34 g/dL (ref 30.0–36.0)
MCHC: 34 g/dL (ref 30.0–36.0)
MCV: 92.2 fL (ref 78.0–100.0)
MCV: 92.3 fL (ref 78.0–100.0)
MCV: 92.5 fL (ref 78.0–100.0)
Platelets: 140 10*3/uL — ABNORMAL LOW (ref 150–400)
Platelets: 145 10*3/uL — ABNORMAL LOW (ref 150–400)
Platelets: 156 10*3/uL (ref 150–400)
RBC: 3.74 MIL/uL — ABNORMAL LOW (ref 3.87–5.11)
RBC: 3.99 MIL/uL (ref 3.87–5.11)
RBC: 4.13 MIL/uL (ref 3.87–5.11)
RDW: 12.9 % (ref 11.5–15.5)
RDW: 12.9 % (ref 11.5–15.5)
RDW: 13 % (ref 11.5–15.5)
WBC: 7.2 10*3/uL (ref 4.0–10.5)
WBC: 9 10*3/uL (ref 4.0–10.5)
WBC: 9.3 10*3/uL (ref 4.0–10.5)

## 2010-07-21 LAB — APTT
aPTT: 28 seconds (ref 24–37)
aPTT: 41 seconds — ABNORMAL HIGH (ref 24–37)
aPTT: 42 seconds — ABNORMAL HIGH (ref 24–37)
aPTT: 47 seconds — ABNORMAL HIGH (ref 24–37)
aPTT: 57 seconds — ABNORMAL HIGH (ref 24–37)
aPTT: 62 seconds — ABNORMAL HIGH (ref 24–37)

## 2010-07-21 LAB — PROTIME-INR
INR: 1.13 (ref 0.00–1.49)
INR: 1.19 (ref 0.00–1.49)
INR: 1.2 (ref 0.00–1.49)
INR: 1.46 (ref 0.00–1.49)
INR: 1.86 — ABNORMAL HIGH (ref 0.00–1.49)
INR: 2 — ABNORMAL HIGH (ref 0.00–1.49)
Prothrombin Time: 14.4 seconds (ref 11.6–15.2)
Prothrombin Time: 15 seconds (ref 11.6–15.2)
Prothrombin Time: 15.1 seconds (ref 11.6–15.2)
Prothrombin Time: 17.6 seconds — ABNORMAL HIGH (ref 11.6–15.2)
Prothrombin Time: 21.3 seconds — ABNORMAL HIGH (ref 11.6–15.2)
Prothrombin Time: 22.5 seconds — ABNORMAL HIGH (ref 11.6–15.2)

## 2010-07-21 LAB — TSH: TSH: 0.801 u[IU]/mL (ref 0.350–4.500)

## 2010-07-21 LAB — CREATININE, SERUM
Creatinine, Ser: 1.22 mg/dL — ABNORMAL HIGH (ref 0.4–1.2)
GFR calc Af Amer: 51 mL/min — ABNORMAL LOW (ref >60)
GFR calc non Af Amer: 42 mL/min — ABNORMAL LOW (ref >60)

## 2010-07-21 LAB — LIPID PANEL
Cholesterol: 143 mg/dL (ref 0–200)
HDL: 58 mg/dL (ref >39)
LDL Cholesterol: 71 mg/dL (ref 0–99)
Total CHOL/HDL Ratio: 2.5 RATIO
Triglycerides: 71 mg/dL (ref <150)
VLDL: 14 mg/dL (ref 0–40)

## 2010-07-21 LAB — BUN: BUN: 20 mg/dL (ref 6–23)

## 2010-07-21 LAB — HEMOGLOBIN A1C
Hgb A1c MFr Bld: 6 % — ABNORMAL HIGH (ref <5.7)
Mean Plasma Glucose: 126 mg/dL — ABNORMAL HIGH (ref <117)

## 2010-07-31 ENCOUNTER — Other Ambulatory Visit: Payer: No Typology Code available for payment source

## 2010-07-31 ENCOUNTER — Ambulatory Visit: Payer: No Typology Code available for payment source | Admitting: Thoracic Surgery

## 2010-08-08 ENCOUNTER — Ambulatory Visit
Admission: RE | Admit: 2010-08-08 | Discharge: 2010-08-08 | Disposition: A | Discharge status: Home / Self Care | Payer: No Typology Code available for payment source | Source: Ambulatory Visit | Attending: Thoracic Surgery | Admitting: Thoracic Surgery

## 2010-08-08 ENCOUNTER — Ambulatory Visit (INDEPENDENT_AMBULATORY_CARE_PROVIDER_SITE_OTHER): Payer: No Typology Code available for payment source | Admitting: Thoracic Surgery

## 2010-08-08 DIAGNOSIS — D381 Neoplasm of uncertain behavior of trachea, bronchus and lung: Secondary | ICD-10-CM

## 2010-08-08 DIAGNOSIS — R911 Solitary pulmonary nodule: Secondary | ICD-10-CM

## 2010-08-09 NOTE — Letter (Signed)
August 08, 2010  Deatra James, MD [address] [place], [place] [address]  Re:  CHAVY, AVERA                  DOB:  27-Jun-1926  Dear Dr. Wynelle Link:  I saw the patient back today.  This 77 year old patient was found to have a right paraesophageal mass that was thought to be either a duplication cyst or enlarged lymph node.  A PET scan was negative.  We had been following her with CT scans and she comes in today and the nodule has completely resolved, so this is probably was an enlarged lymph node.  I informed the patient of the finding.  Her blood pressure is 160/70, pulse 78, respirations 18, and saturations were 98%.  I do not need to see her any more.  I appreciate the opportunity of seeing this very nice lady.  Ines Bloomer, M.D. Electronically Signed  DPB/MEDQ  D:  08/08/2010  T:  08/09/2010  Job:  [id number]

## 2010-08-16 LAB — URINALYSIS, ROUTINE W REFLEX MICROSCOPIC
Bilirubin Urine: NEGATIVE
Bilirubin Urine: NEGATIVE
Glucose, UA: 500 mg/dL — AB
Glucose, UA: NEGATIVE mg/dL
Hgb urine dipstick: NEGATIVE
Hgb urine dipstick: NEGATIVE
Ketones, ur: NEGATIVE mg/dL
Ketones, ur: NEGATIVE mg/dL
Nitrite: NEGATIVE
Nitrite: NEGATIVE
Protein, ur: NEGATIVE mg/dL
Protein, ur: NEGATIVE mg/dL
Specific Gravity, Urine: 1.024 (ref 1.005–1.030)
Specific Gravity, Urine: 1.025 (ref 1.005–1.030)
Urobilinogen, UA: 0.2 mg/dL (ref 0.0–1.0)
Urobilinogen, UA: 0.2 mg/dL (ref 0.0–1.0)
pH: 5.5 (ref 5.0–8.0)
pH: 6 (ref 5.0–8.0)

## 2010-08-16 LAB — PROTIME-INR
INR: 0.9 (ref 0.00–1.49)
INR: 1.1 (ref 0.00–1.49)
INR: 1.7 — ABNORMAL HIGH (ref 0.00–1.49)
INR: 2 — ABNORMAL HIGH (ref 0.00–1.49)
INR: 2.3 — ABNORMAL HIGH (ref 0.00–1.49)
INR: 2.4 — ABNORMAL HIGH (ref 0.00–1.49)
Prothrombin Time: 12.6 seconds (ref 11.6–15.2)
Prothrombin Time: 14.3 seconds (ref 11.6–15.2)
Prothrombin Time: 20.3 seconds — ABNORMAL HIGH (ref 11.6–15.2)
Prothrombin Time: 23.9 seconds — ABNORMAL HIGH (ref 11.6–15.2)
Prothrombin Time: 26.5 seconds — ABNORMAL HIGH (ref 11.6–15.2)
Prothrombin Time: 27.8 seconds — ABNORMAL HIGH (ref 11.6–15.2)

## 2010-08-16 LAB — CBC
HCT: 28.5 % — ABNORMAL LOW (ref 36.0–46.0)
HCT: 31.3 % — ABNORMAL LOW (ref 36.0–46.0)
HCT: 35.2 % — ABNORMAL LOW (ref 36.0–46.0)
HCT: 37 % (ref 36.0–46.0)
Hemoglobin: 10.5 g/dL — ABNORMAL LOW (ref 12.0–15.0)
Hemoglobin: 11.5 g/dL — ABNORMAL LOW (ref 12.0–15.0)
Hemoglobin: 12.1 g/dL (ref 12.0–15.0)
Hemoglobin: 9.4 g/dL — ABNORMAL LOW (ref 12.0–15.0)
MCHC: 32.7 g/dL (ref 30.0–36.0)
MCHC: 32.7 g/dL (ref 30.0–36.0)
MCHC: 32.9 g/dL (ref 30.0–36.0)
MCHC: 33.5 g/dL (ref 30.0–36.0)
MCV: 92.3 fL (ref 78.0–100.0)
MCV: 92.7 fL (ref 78.0–100.0)
MCV: 92.8 fL (ref 78.0–100.0)
MCV: 92.9 fL (ref 78.0–100.0)
Platelets: 138 10*3/uL — ABNORMAL LOW (ref 150–400)
Platelets: 153 10*3/uL (ref 150–400)
Platelets: 185 10*3/uL (ref 150–400)
Platelets: 188 10*3/uL (ref 150–400)
RBC: 3.07 MIL/uL — ABNORMAL LOW (ref 3.87–5.11)
RBC: 3.39 MIL/uL — ABNORMAL LOW (ref 3.87–5.11)
RBC: 3.79 MIL/uL — ABNORMAL LOW (ref 3.87–5.11)
RBC: 3.99 MIL/uL (ref 3.87–5.11)
RDW: 15 % (ref 11.5–15.5)
RDW: 15.2 % (ref 11.5–15.5)
RDW: 15.2 % (ref 11.5–15.5)
RDW: 15.3 % (ref 11.5–15.5)
WBC: 6.8 10*3/uL (ref 4.0–10.5)
WBC: 7.7 10*3/uL (ref 4.0–10.5)
WBC: 7.8 10*3/uL (ref 4.0–10.5)
WBC: 9 10*3/uL (ref 4.0–10.5)

## 2010-08-16 LAB — URINE CULTURE
Colony Count: NO GROWTH
Culture: NO GROWTH
Special Requests: NEGATIVE

## 2010-08-16 LAB — BASIC METABOLIC PANEL
BUN: 11 mg/dL (ref 6–23)
BUN: 12 mg/dL (ref 6–23)
BUN: 21 mg/dL (ref 6–23)
CO2: 24 mEq/L (ref 19–32)
CO2: 26 mEq/L (ref 19–32)
CO2: 26 mEq/L (ref 19–32)
Calcium: 8.6 mg/dL (ref 8.4–10.5)
Calcium: 8.6 mg/dL (ref 8.4–10.5)
Calcium: 8.8 mg/dL (ref 8.4–10.5)
Chloride: 102 mEq/L (ref 96–112)
Chloride: 103 mEq/L (ref 96–112)
Chloride: 104 mEq/L (ref 96–112)
Creatinine, Ser: 0.98 mg/dL (ref 0.4–1.2)
Creatinine, Ser: 0.99 mg/dL (ref 0.4–1.2)
Creatinine, Ser: 1.11 mg/dL (ref 0.4–1.2)
GFR calc Af Amer: 57 mL/min — ABNORMAL LOW (ref >60)
GFR calc Af Amer: >60 mL/min (ref >60)
GFR calc Af Amer: >60 mL/min (ref >60)
GFR calc non Af Amer: 47 mL/min — ABNORMAL LOW (ref >60)
GFR calc non Af Amer: 54 mL/min — ABNORMAL LOW (ref >60)
GFR calc non Af Amer: 55 mL/min — ABNORMAL LOW (ref >60)
Glucose, Bld: 142 mg/dL — ABNORMAL HIGH (ref 70–99)
Glucose, Bld: 154 mg/dL — ABNORMAL HIGH (ref 70–99)
Glucose, Bld: 191 mg/dL — ABNORMAL HIGH (ref 70–99)
Potassium: 3.7 mEq/L (ref 3.5–5.1)
Potassium: 3.8 mEq/L (ref 3.5–5.1)
Potassium: 3.8 mEq/L (ref 3.5–5.1)
Sodium: 133 mEq/L — ABNORMAL LOW (ref 135–145)
Sodium: 134 mEq/L — ABNORMAL LOW (ref 135–145)
Sodium: 134 mEq/L — ABNORMAL LOW (ref 135–145)

## 2010-08-16 LAB — COMPREHENSIVE METABOLIC PANEL
ALT: 16 U/L (ref 0–35)
AST: 14 U/L (ref 0–37)
Albumin: 3.6 g/dL (ref 3.5–5.2)
Alkaline Phosphatase: 72 U/L (ref 39–117)
BUN: 30 mg/dL — ABNORMAL HIGH (ref 6–23)
CO2: 30 mEq/L (ref 19–32)
Calcium: 9.5 mg/dL (ref 8.4–10.5)
Chloride: 107 mEq/L (ref 96–112)
Creatinine, Ser: 1.15 mg/dL (ref 0.4–1.2)
GFR calc Af Amer: 55 mL/min — ABNORMAL LOW (ref >60)
GFR calc non Af Amer: 46 mL/min — ABNORMAL LOW (ref >60)
Glucose, Bld: 96 mg/dL (ref 70–99)
Potassium: 4.4 mEq/L (ref 3.5–5.1)
Sodium: 140 mEq/L (ref 135–145)
Total Bilirubin: 0.7 mg/dL (ref 0.3–1.2)
Total Protein: 6.2 g/dL (ref 6.0–8.3)

## 2010-08-16 LAB — APTT: aPTT: 23 seconds — ABNORMAL LOW (ref 24–37)

## 2010-08-16 LAB — TYPE AND SCREEN
ABO/RH(D): A POS
Antibody Screen: NEGATIVE

## 2010-08-16 LAB — URINE MICROSCOPIC-ADD ON

## 2010-09-18 NOTE — Consult Note (Signed)
NEW PATIENT CONSULTATION   Tiffany [name], APGAR  DOB:  1927-03-27                                       06/21/2008  ZOXWR#:[id number]   The patient is a 77 year old female referred by Dr. Lequita Halt for vascular  surgery evaluation for edema in both lower extremities, right worse than  left.  She is scheduled to have a right total knee arthroplasty in the  near future.  She has been having edema in both legs over the last  several months dating back to probably May of 2009 with the right worse  than the left.  This has been treated with diuretics on occasion but  that was discontinued by her medical doctor in December because of some  mildly abnormal kidney function.  She has no history of deep vein  thrombosis, thrombophlebitis, pulmonary emboli or other clotting  problems.  She has not been able to wear elastic compression stockings  because they are uncomfortable.  She states the swelling has gotten a  lot better in the last few weeks.   PAST MEDICAL HISTORY:  1. Hypertension.  2. Negative for diabetes, coronary artery disease, congestive heart      failure, COPD or stroke.   PAST SURGICAL HISTORY:  1. She has had five operations on her left hip including total joint      replacement.  2. She has had bilateral mastectomy.  3. Vaginal hysterectomy.  4. Cholecystectomy.   FAMILY HISTORY:  Positive for diabetes in her mother and brother and  sister.  Negative for coronary artery disease and stroke.   SOCIAL HISTORY:  She is widowed and retired.  Does not use tobacco or  alcohol.   REVIEW OF SYSTEMS:  Positive for weight loss and loss of appetite.  Also  has discomfort in both legs even lying flat.  No history of stasis  ulcers.  No arthritis.   ALLERGIES:  None known.   MEDICATIONS:  Please see health history form.   PHYSICAL EXAM:  Vital signs:  Blood pressure 190/86, heart rate 83,  respirations 20.  General:  She is an elderly female in no apparent  distress, alert and oriented x3.  Neck:  Supple, 3+ carotid pulses  palpable.  No bruits are audible.  Neurological:  Normal.  No palpable  adenopathy in the neck.  Chest:  Clear to auscultation.  Cardiovascular:  Regular rhythm.  No murmurs.  Abdomen:  Soft, nontender with no masses.  She has 3+ femoral, popliteal, posterior tibial pulses palpable  bilaterally.  Both feet are well-perfused.  There is 1+ edema in both  legs but no hyperpigmentation, ulceration or varicosities are noted.  The circumference of both calves is similar at 38 cm.   Venous duplex exam bilaterally reveals no evidence of any obstruction or  valvular incompetence in the superficial system or the deep system  except for some mild reflux in the popliteal veins.   I reassured her regarding these findings.  I do not see any  contraindication to proceeding with her knee replacement surgery.  I did  suggest that she elevate her legs at night and be fitted with some  appropriate elastic compression stockings postoperatively.  She will  return to [place] on a p.r.n. basis.   Quita Skye Hart Rochester, M.D.  Electronically Signed   JDL/MEDQ  D:  06/21/2008  T:  06/22/2008  Job:  [id number]   cc:   Ollen Gross, M.D.

## 2010-09-18 NOTE — Letter (Signed)
January 02, 2010   Deatra James, MD  [address]  [place], [place] [address]   Re:  Tiffany [name], HEINKE                  DOB:  1926/09/06   Dear Dr. Wynelle Link:   I saw Tiffany [name] in Tiffany office today.  This 77 year old [name]  developed shortness of breath and had a CT scan 6 weeks ago which showed  large pulmonary embolus in Tiffany left lower lobe.  She was treated with  heparin and she is now on Coumadin with an INR of approximately 3, being  followed by you.  Also at Tiffany same time of her CT scan, there was found  to be a right paraesophageal mass in Tiffany subcarinal area that was 1.4 x  2 x 3.2, which could be adenopathy or possible mass.  No other nodes  were enlarged.  She also has a small spur on Tiffany meningioma in Tiffany left  side of Tiffany spinal canal T9 and T10 and moderate hiatal hernia and  diffuse hepatic steatosis.  She is referred here for evaluation.  She  has had no weight loss, no melena, or hematemesis.   PAST MEDICAL HISTORY:  Significant for hypertension.   MEDICATIONS:  1. Synthroid 75 mcg a day for hypothyroidism.  2. Oxycodone 5/325 for chronic back pain.  3. Metoprolol 50 mg a day.  4. Avapro 300 mg a day.  5. Coumadin 5 mg alternating with 2.5 mg.  6. Lomotil p.r.n.  7. Calcium.   ALLERGIES:  She is allergic to codeine, sulfa, and morphine.   FAMILY HISTORY:  Noncontributory.   SOCIAL HISTORY:  She is a widow, has one child.  She is a retired  Comptroller.  Does not smoke, does not drink alcohol.   REVIEW OF SYSTEMS:  VITAL SIGNS:  She is 170 pounds.  She is 5 feet 2  inches.  GENERAL:  No weight loss or weight gain.  CARDIAC:  No angina or atrial fibrillation.  PULMONARY:  No hemoptysis, bronchitis, or asthma.  GI:  See history of present illness.  GU:  No kidney disease or frequent urination.  VASCULAR:  No claudication, but has had Tiffany pulmonary embolus with a  left femoral DVT.  NEUROLOGICAL:  No dizziness, headaches, blackouts, or seizures.  MUSCULOSKELETAL:  Arthritis.  PSYCHIATRIC:  No depression or nervousness.  EYE/ENT:  No change in her eyesight or hearing.  HEMATOLOGICAL:  No problems with bleeding, but is on Coumadin.  No  anemia.   PHYSICAL EXAMINATION:  General:  She is a slightly obese Caucasian  female, in no acute distress.  Vital Signs:  Her blood pressure was  193/87, pulse 88, respirations 18, and sats were 97%.  Head, Eyes, Ears,  Nose, and Throat:  Unremarkable.  Neck:  Supple without thyromegaly.  There is no supraclavicular or axillary adenopathy.  Chest:  Clear to  auscultation and percussion.  Heart:  Regular, sinus rhythm.  Abdomen:  Soft.  There is no hepatosplenomegaly.  Extremities:  Pulses are 2+.  There is no clubbing or edema.  Neurological:  She is oriented x3.  Sensory and motor intact.   I am a little worried about this paraesophageal mass, this could be an  early lymphoma.  My doubt that it is a cancer.  Given that she has had a  pretty significant pulmonary embolus recently, I would not want to do a  biopsy at this time unless she  had a high index of suspicion of this was  cancer, so I plan to get a PET scan and if Tiffany PET scan is positive,  then we will stop her Coumadin and probably bridge with Lovenox and have  Dr. Wendall Papa to do an esophageal ultrasound biopsy.  If Tiffany PET scan  is negative, then I would recommend just following this with a followup  CT scan in approximately 6 weeks.  I appreciate Tiffany opportunity of  seeing Tiffany [name].   Sincerely,   Ines Bloomer, M.D.  Electronically Signed   DPB/MEDQ  D:  01/02/2010  T:  01/03/2010  Job:  [id number]   cc:   Elvina Sidle, M.D.

## 2010-09-18 NOTE — Discharge Summary (Signed)
NAMEMarland [name]  MARKIAH, JANEWAY NO.:  [id number]   MEDICAL RECORD NO.:  [id number]          PATIENT TYPE:  INP   LOCATION:  1603                         FACILITY:  [facility]   PHYSICIAN:  Ollen Gross, M.D.    DATE OF BIRTH:  1926/12/05   DATE OF ADMISSION:  07/27/2008  DATE OF DISCHARGE:  08/01/2008                               DISCHARGE SUMMARY   ADMITTING DIAGNOSES:  1. Osteoarthritis right knee.  2. Hypertension.  3. Fibrocystic breast disease.  4. Past history of bladder infections.  5. Past history of bronchitis.   DISCHARGE DIAGNOSIS:  1. Osteoarthritis right knee status post right total knee replacement      arthroplasty.  2. Mild postop acute blood loss anemia, did not require transfusion.  3. Postop hyponatremia, improving.   1. Hypertension.  2. Fibrocystic breast disease.  3. Past history of bladder infections.  4. Past history of bronchitis.   PROCEDURE:  On July 27, 2008 right total knee.  Surgeon Dr. Lequita Halt.  Assistant: Avel Peace, PA-C. Anesthesia:  Spinal.  Consults:  None.   BRIEF HISTORY:  Tiffany [name] is a 81-year female who has experienced  osteoarthritis in her right knee, progressive worsening of pain and  dysfunction, __________ who now presents for total knee arthroplasty.   LABORATORY DATA:  CBC showed on admission hemoglobin 12.1, hematocrit  37, white cell count 6.8, platelets 188.  Pro time and INR 12.6 and 0.9,  PTT of 23.  Chem panel on admission slightly elevated BUN of 30.  Remaining Chem panel within normal limits.  Preop UA did show 7-10 white  cells, 0-2 red cells, few bacteria, and small leukocyte esterase.  Follow-up UA positive urine glucose otherwise negative.  Serial CBCs  were followed.  Hemoglobin dropped down to 11.5 then 10.5. Last noted H  and H 9.4 and 28.5.  Serial pro times followed per Coumadin protocol.  PT/INR 27.8/0.54. serial BMETS were followed. Sodium dropped from 140 to  133 and back up to 134, BUN came  down to normal level of 12.   EKG July 22, 2008 sinus bradycardia with possible premature atrial  complexes, aberrant conduction. She tolerated the procedure well. Later  transferred to recovery on the orthopedic floor.  She is on p.o. and IV  analgesic pain control following surgery. Doing pretty well on the  morning of day 1 and had a little bit of mild hyponatremia, felt be  delusional component. Decrease of fluids. Had a preop bacteruria which  was treated with Cipro on an outpatient basis. Will be repeated. Did a  UA and did a follow-up culture which proved to be negative.  Started  getting up out of bed on day 1, encouraged pulmonary toilet with a  history of bronchitis.  We got social work involved because she was  thinking about she would need skilled nursing facility of choice. By day  2 she was doing pretty well.  Sodium was back up.  Dressing change and  incision looked good.  Hemoglobin was stable at 10.5.  She stayed  through the weekend to receive therapy each day. She was only  doing  short distances the first couple of days about 5 or 10 feet, slowly  progressing a little bit more through the weekend.  Incision healed  well.  She was placed on Coumadin and INRs became therapeutic over the  weekend. On Monday morning of August 01, 2008 the patient was seen in  rounds by Dr. Lequita Halt feeling good, progressing with her therapy. It was  felt that she would be ready to go to the skilled nursing facility.  We  will allow her to go over at that time.   DISCHARGE/PLAN:  1. The patient was transferred over to skilled nursing facility      choice.  2. For discharge diagnoses please see above.  3. Discharge meds Coumadin protocol.  Please titrate Coumadin level      for target INR between 2 and 3.  She has been on Coumadin for 3      weeks from the date of surgery July 27, 2008.  4. Colace 100 mg p.o. b.i.d.  5. Levothyroxine 75 mcg p.o. daily.  6. Lopressor 50 mg p.o. b.i.d.   7. Avapro 300 mg daily.  8. Robaxin 500 p.o. q.6 h. p.r.n. spasm.  9. Tylenol 325 one or 2 q.4-6 h. as needed for pain.  10.Oxycodone 5-10 mg p.o. q.4 h. p.r.n. pain.  11.Laxative of choice, enema of choice.   DIET:  Heart-healthy diet.   ACTIVITY:  She is weightbearing as tolerated, right knee, right leg  total knee protocol.  PT and OT for gait training and  ambulation, ADLs,  range of motion, strengthening exercises for total knee protocol.   FOLLOW UP:  She is to follow up with Dr. Lequita Halt 2 weeks from date of  surgery.  Please contact the office of [phone] to help arrange  appointment with Dr. Lequita Halt  at [facility] at office of  [facility].   DISPOSITION:  Pending at time of dictation. Waiting on final bed offer.   CONDITION ON DISCHARGE:  Improving.      Alexzandrew L. Perkins, P.A.C.      Ollen Gross, M.D.  Electronically Signed    ALP/MEDQ  D:  08/01/2008  T:  08/01/2008  Job:  [id number]   cc:   Ollen Gross, M.D.  Fax: [phone]   Deatra James, M.D.  Fax: [phone]   skilled nursing facility of choice

## 2010-09-18 NOTE — H&P (Signed)
NAMEMarland [name]  KELLIN, FIFER NO.:  [id number]   MEDICAL RECORD NO.:  [id number]          PATIENT TYPE:  INP   LOCATION:  1603                         FACILITY:  [facility]   PHYSICIAN:  Ollen Gross, M.D.    DATE OF BIRTH:  June 10, 1926   DATE OF ADMISSION:  07/27/2008  DATE OF DISCHARGE:                              HISTORY & PHYSICAL   CHIEF COMPLAINT:  Right knee pain.   HISTORY OF PRESENT ILLNESS:  The patient is a 77 year old female well-  known to Dr. Homero Fellers Aluisio having undergone hip surgery in 2006 which  was a revision procedure.  She unfortunately has ongoing pain in the  right knee and is known to have end stage arthritis.  This has been  treated conservatively in the past.  Despite conservative measures, it  was felt she would benefit from undergoing surgical intervention.  The  risks and benefits have been discussed, and she elects to proceed with  surgery.   ALLERGIES:  SULFA and CODEINE.   CURRENT MEDICATIONS:  Avapro, oxycodone, Caltrate plus D, metoprolol,  levothyroxine.   PAST MEDICAL HISTORY:  Hypertension, fibrocystic breast disease, past  history of bladder infections, past history of bronchitis.   PAST SURGICAL HISTORY:  She has had bilateral mastectomies, vaginal  hysterectomy, left hip replacement which was in January 1992, a left hip  revision in December 1997, left hip revision again in 1998, and most  recent hip revision is December 2006.  She has also had her gallbladder  removed.   FAMILY HISTORY:  Father deceased old age.  Mother deceased history of  pneumonia.   SOCIAL HISTORY:  Widowed, retired, nonsmoker.  No alcohol.  Lives alone.  Family will be assisting with care after surgery.   REVIEW OF SYSTEMS:  GENERAL:  No fevers, chills, night sweats.  NEURO:  No seizures, syncope, or paralysis.  RESPIRATORY:  No shortness of  breath, productive cough, or hemoptysis.  CARDIOVASCULAR:  No chest pain  or orthopnea.  GI: No nausea,  vomiting, diarrhea, or constipation.  GU:  A little bit of nocturia.  No dysuria or hematuria.  MUSCULOSKELETAL:  Knee pain.   PHYSICAL EXAMINATION:  VITAL SIGNS:  Pulse 64, respirations 14, blood  pressure 132/64.  GENERAL:  A 77 year old, white female well-nourished, well-developed in  no acute distress.  She is alert, oriented, cooperative, pleasant.  HEENT:  Normocephalic, atraumatic.  Pupils round and reactive.  EOMs  intact.  NECK:  Supple.  CHEST:  Clear.  HEART:  Regular rate and rhythm.  No murmur.  S1, S2 noted.  ABDOMEN:  Soft, nontender.  Bowel sounds present.  RECTAL/BREASTS/GENITALIA:  Not done, not pertinent to present illness.  EXTREMITIES:  Right knee range of motion 10 to 115.  No instability.  Crepitus is noted.   IMPRESSION:  Osteoarthritis right knee.   PLAN:  The patient admitted to [facility] to undergo a right  total knee replacement arthroplasty.  Surgery will be performed by Dr.  Ollen Gross.      Alexzandrew L. Perkins, P.A.C.      Ollen Gross, M.D.  Electronically Signed  ALP/MEDQ  D:  07/27/2008  T:  07/27/2008  Job:  [id number]   cc:   Ollen Gross, M.D.  Fax: [phone]   Deatra James, M.D.  Fax: [phone]

## 2010-09-18 NOTE — Letter (Signed)
January 09, 2010   Deatra James, M.D.  [address]  [place], [place] [address]   Re:  Tiffany [name], JOOS                  DOB:  12-10-1926   Dear Dr. Wynelle Link:   I saw the patient back today after her PET scan and the lesions that we  were concerned about in her right lower lobe had minimal uptake on her  PET scan which goes along with a benign etiology such as a duplication  cyst or just enlarged lymph node.  We will need to continue to follow  her.  So, I have scheduled her back to see me in 6 months with another  CT scan.  She was informed of the findings.  Her blood pressure was  200/98, pulse 78, respirations 18, saturations were 96%.   Ines Bloomer, M.D.  Electronically Signed   DPB/MEDQ  D:  01/09/2010  T:  01/10/2010  Job:  [id number]

## 2010-09-18 NOTE — Op Note (Signed)
NAME:  Tiffany [name], Tiffany [name] NO.:  [id number]   MEDICAL RECORD NO.:  [id number]          PATIENT TYPE:  INP   LOCATION:  0001                         FACILITY:  [facility]   PHYSICIAN:  Ollen Gross, M.D.    DATE OF BIRTH:  March 29, 1927   DATE OF PROCEDURE:  07/27/2008  DATE OF DISCHARGE:                               OPERATIVE REPORT   PREOPERATIVE DIAGNOSIS:  Osteoarthritis right knee.   POSTOPERATIVE DIAGNOSIS:  Osteoarthritis right knee.   PROCEDURE:  Right total knee arthroplasty.   SURGEON:  Dr. Lequita Halt.   ASSISTANT:  Avel Peace, PA-C.   ANESTHESIA:  Spinal.   ESTIMATED BLOOD LOSS:  Minimal.   DRAIN:  None.   TOURNIQUET TIME:  34 minutes at 300 mmHg.   COMPLICATIONS:  None.   CONDITION:  Stable to recovery room.   BRIEF CLINICAL NOTE:  Tiffany [name] is a 77 year old female who has  severe end-stage arthritis of the right knee with progressively  worsening pain and dysfunction.  She has failed nonoperative management  and presents for total knee arthroplasty.   PROCEDURE IN DETAIL:  After the successful administration of spinal  anesthetic, a tourniquet was placed on the right thigh and the right  lower extremity prepped and draped in the usual sterile fashion.  The  extremity was wrapped in an Esmarch, knee flexed and tourniquet inflated  to 300 mmHg.  A midline incision was made with a 10 blade through  subcutaneous tissue to the level of the extensor mechanism.  A fresh  blade was used make a medial parapatellar arthrotomy.  Soft tissue on  the proximal and medial tibia was subperiosteally elevated to the joint  line with the knife and into the semimembranosus bursa with a Cobb  elevator.  Soft tissue laterally was elevated with attention being paid  to avoiding patellar tendon on the tibial tubercle.  The patella  subluxed laterally, knee flexed 90 degrees and ACL and PCL were removed.  A drill was used create a starting hole in the distal femur and  the  canal was thoroughly irrigated.  The 5 degree right valgus alignment  guide is placed and referencing off the posterior condyles, rotation was  marked in a block pin to remove 10 mm off the distal femur.  Distal  femoral resection is made with an oscillating saw.  Sizing block was  placed and a size 2.5 was the most appropriate.  Rotation was marked at  the epicondylar axis.  A size 2.5 cutting block was placed and the  anterior, posterior and chamfer cuts were made.   The tibia subluxed forward and the menisci were removed.  The  extramedullary tibial alignment guide was placed referencing proximally  at the medial aspect of the tibial tubercle and distally along the  second metatarsal axis and tibial crest.  The block was pinned to remove  about 4 mm from the more deficient lateral side.  Tibial resection was  made with an oscillating saw.  A size 3 was the most appropriate tibial  component and the proximal tibia was prepared with a modular drill and  keel  punch for the size 3.  Femoral preparation was completed with  intercondylar cut for the size 2.5.   A size 3 mobile bearing tibial tray size 2.5 posterior stabilized  femoral trial and a 10 mm posterior stabilized rotating platform insert  trial were placed.  With the 10, full extension was achieved with  excellent varus-valgus and anterior-posterior balance throughout full  range of motion.  The patella was then everted and thickness measured to  be 20 mm.  Freehand resection was taken to 12 mm, 35 template is placed,  lug holes were drilled, trial patella was placed and it tracked  normally.  Osteophytes were removed off the posterior femur with the  trial in place.  All trials were removed and the cut bone surfaces were  prepared with pulsatile lavage.  The cement was mixed and once ready for  implantation, the size 3 mobile bearing tibial tray, the size 2.5  posterior stabilized femur and the 35 patella were cemented into  place  and the patella was held with a clamp.  The trial 10-mm inserts was  placed, knee held in full extension and all extruded cement removed.  When the cement had fully hardened, then the permanent 10-mm posterior  stabilized rotating platform insert was placed in the tibial tray.  The  wound was copiously irrigated with saline solution and then FloSeal  injected in the posterior capsule, medial and lateral gutters and  suprapatellar area.  A moist sponge was placed and the tourniquet  released for a total time of 34 minutes.  The sponge was held for 2  minutes and removed.  Minimal bleeding was encountered.  The bleeding  that was encountered was stopped with electrocautery.  The wounds again  irrigated and the arthrotomy closed with interrupted #1 PDS.  Flexion  against gravity was about 140 degrees.  The subcu was closed with  interrupted 2-0 Vicryl and subcuticular running 4-0 Monocryl.  The  incisions were cleaned and dried and Steri-Strips and bulky sterile  dressing were applied.  She was then placed into a knee immobilizer,  awakened and transported to recovery in stable condition.      Ollen Gross, M.D.  Electronically Signed     FA/MEDQ  D:  07/27/2008  T:  07/27/2008  Job:  [id number]

## 2010-09-18 NOTE — Procedures (Signed)
DUPLEX DEEP VENOUS EXAM - LOWER EXTREMITY   INDICATION:  Bilateral lower extremity swelling and pain.   HISTORY:  Edema:  No.  Trauma/Surgery:  No.  Pain:  Yes.  PE:  No.  Previous DVT:  No.  Anticoagulants:  No.  Other:   DUPLEX EXAM:                CFV   SFV   PopV  PTV    GSV                R  L  R  L  R  L  R   L  R  L  Thrombosis    o  o  o  o  o  o  o   o  o  o  Spontaneous   +  +  +  +  +  +  +   +  +  +  Phasic        +  +  +  +  +  +  +   +  +  +  Augmentation  +  +  +  +  +  +  +   +  +  +  Compressible  +  +  +  +  +  +  +   +  +  +  Competent     +  +  +  +  +  +  +   +  +  +   Legend:  + - yes  o - no  p - partial  D - decreased   IMPRESSION:  1. No evidence of deep or superficial vein thrombosis noted in      bilateral lower extremities.  2. Venous reflux noted in the right and left popliteal veins.    _____________________________  Quita Skye Hart Rochester, M.D.   AC/MEDQ  D:  06/21/2008  T:  06/21/2008  Job:  [id number]

## 2010-09-21 NOTE — Discharge Summary (Signed)
NAMEMarland Kitchen  NOELIE, RENFROW NO.:  [id number]   MEDICAL RECORD NO.:  [id number]          PATIENT TYPE:  INP   LOCATION:  1514                         [facility]ACILITY:  [facility]   PHYSICIAN:  Ollen Gross, M.D.    DATE OF BIRTH:  16-Jul-1926   DATE OF ADMISSION:  09/28/2004  DATE OF DISCHARGE:                                 DISCHARGE SUMMARY   ADDENDUM DISCHARGE SUMMARY   ADMITTING DIAGNOSIS:  1.  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  2.  Hypertension.  3.  History of constipation.   DISCHARGE DIAGNOSES:  1.  Left acetabular component loosening with fracture, status post left      acetabular femoral head revision bone grafting.  2.  Hypertension.  3.  History of constipation.   ADDENDUM HISTORY:  The patient was originally scheduled to be discharge on  October 05, 2004. However, the multiple beds were offered and decision had not  been made. She stayed in the hospital through the weekend and was monitored  by orthopedic services. She continued with her therapy. Bed offers were  reviewed and the patient and family have chosen [facility]. Bed was set up to be available on Monday, October 08, 2004. The patient  was seen on morning rounds by Dr. Lequita Halt and she was transferred at that  time.   DISCHARGE PLAN:  Discharge plan as per previously-dictated Discharge  Summary.   FOLLOW-UP:  Originally we asked her to follow up in 2 to 2-and-a-half weeks.  At this time, have her follow up 4 weeks from the date of surgery. Please  contact the office at [phone] to arrange an appointment time and transfer  the patient.   FURTHER INSTRUCTIONS:  Cherlynn Polo will be removed at the hospital prior to  discharge. Steri-Strips will also be applied. Continued plan as per  previously-dictated Discharge Summary.      ALP/MEDQ  D:  10/08/2004  T:  10/08/2004  Job:  [id number]   cc:   Molly Maduro A. Nicholos Johns, M.D.  [address]  [place]  [place]  [address]  Fax: [phone]   [facility]

## 2010-09-21 NOTE — Op Note (Signed)
NAME:  Tiffany [name], Tiffany [name]                      ACCOUNT NO.:  [id number]   MEDICAL RECORD NO.:  [id number]                   PATIENT TYPE:  OIB   LOCATION:  2873                                 FACILITY:  [facility]   PHYSICIAN:  Guadelupe Sabin, M.D.             DATE OF BIRTH:  Apr 01, 1927   DATE OF PROCEDURE:  DATE OF DISCHARGE:                                 OPERATIVE REPORT   PREOPERATIVE DIAGNOSIS:  Senile nuclear cataract, left eye.   POSTOPERATIVE DIAGNOSIS:  Senile nuclear cataract, left eye.   OPERATION:  Planned extracapsular cataract extraction, phacoemulsification,  primary insertion of posterior chamber intraocular lens implant.   SURGEON:  Guadelupe Sabin, M.D.   ASSISTANT:  Nurse.   ANESTHESIA:  Local 4% Xylocaine, 0.75 Marcaine retrobulbar  block, topical  tetracaine, intraocular Xylocaine.  Anesthesia standby required in this  elderly patient. The patient was given sodium pentothal intravenously during  the period of retrobulbar blocking.   DESCRIPTION OF PROCEDURE:  After the patient was prepped and draped, the lid  speculum was inserted in the left eye. The eye was turned downward and a  superior rectus traction suture was placed. Tonometry was recorded at 7  scale units with a 5.5 gm weight, indicating a normal pressure.   A peritomy was performed adjacent to the limbus from 11 to 1 o'clock  position. The corneal scleral junction was cleaned and a corneal scleral  groove was made with a 45 degree Superblade. The anterior chamber was then  entered with the 2.5 mm diamond keratome at the 12 o'clock position and the  15 degree blade at the 2:30 position.   Using a bent 26 gauge needle on a Healon syringe, a circular capsulorrhexis  was begun and then completed with the Grabow forceps. Hydrodissection and  hydrodelineation were performed using 1% Xylocaine. The 30 degree  phacoemulsification tip was then inserted with slow controlled  emulsification of the  lens nucleus. Total ultrasonic time and 18  seconds. Average power level 13%. Total amount of  fluid used  50 cc.   Following the removal of the nucleus, the residual cortex was aspirated with  the irrigation aspiration tip. The posterior capsule appeared intact with a  brilliant red fundus reflex. It was therefore elected to insert an [employer] SI40 and the silicone  three piece  posterior chamber  intraocular lens implant with UV absorber. Diopter strength +22.00.   This was inserted with the McDonald forceps into the anterior chamber and  then centered into the capsular bag using the [facility] lens rotator. The lens  appeared to be well centered. The Healon which had been used throughout the  procedure was aspirated and replaced with balanced salt solution and Miochol  ophthalmic solution. The operative incisions appeared to be self sealing and  no sutures were required.   Maxitrol ointment was instilled in the conjunctival cul-de-sac and a  light  patch  and protector shield were applied to the operated left eye. Duration  of procedure and anesthesia administration were 45 minutes.   The patient tolerated the procedure well in general. The patient left the  operating room for the recovery room in good condition.                                                Guadelupe Sabin, M.D.    HNJ/MEDQ  D:  02/23/2002  T:  02/23/2002  Job:  [id number]   cc:   Molly Maduro A. Nicholos Johns, M.D.

## 2010-09-21 NOTE — H&P (Signed)
NAME:  Tiffany [name], Tiffany [name] NO.:  [id number]   MEDICAL RECORD NO.:  [id number]          PATIENT TYPE:  INP   LOCATION:  1518                         FACILITY:  [facility]   PHYSICIAN:  Ollen Gross, M.D.    DATE OF BIRTH:  [birth year]   DATE OF ADMISSION:  04/12/2005  DATE OF DISCHARGE:  04/16/2005                                HISTORY & PHYSICAL   DATE OF OFFICE VISIT/HISTORY AND PHYSICAL:  April 09, 2005.   DATE OF ADMISSION:  April 12, 2005.   CHIEF COMPLAINT:  Loose left acetabular component.   HISTORY OF PRESENT ILLNESS:  Patient is a 77 year old female who has been  seen by Dr. Despina Hick.  Well known, having previously undergone a total hip in  1992 and a revision in December, 1996.  Had a most recent revision in May,  2006.  Unfortunately, she has gone on to develop loosening of the acetabular  component.  She is seen in the office by Dr. Despina Hick, and felt that due to  the significant findings, she is going to need revision.  Risks and benefits  of the procedure have been discussed with the patient, who elects to proceed  with surgery.   ALLERGIES:  SULFA causes itching and welts.   INTOLERANCES:  1.  MORPHINE causes nausea.  2.  CODEINE causes nausea.  3.  ULTRAM causes nausea and dizziness.   CURRENT MEDICATIONS:  1.  Avapro 300 mg daily.  2.  Toprol XL 100 mg daily.  3.  Centrum Plus D 100 mg daily.   PAST MEDICAL HISTORY:  1.  Fibrocystic breast disease.  2.  History of bladder infections.  3.  Hypertension.  4.  History of bronchitis.   PAST SURGICAL HISTORY:  1.  Bilateral mastectomy.  2.  Vaginal hysterectomy.  3.  Cholecystectomy.  4.  Left total hip arthroplasty in 1992.  5.  Revision, left hip, in 1996.  6.  Revision, left hip, in 1997.  7.  Most recent revision, left total hip, in May, 2006.   FAMILY HISTORY:  Significant for osteoarthritis.  Mother deceased in her 55s  with diabetes.  Father deceased at age 69.   SOCIAL HISTORY:   Denies the use of tobacco products or alcohol products.   REVIEW OF SYSTEMS:  GENERAL:  No fevers, chills, night sweats.  NEURO:  No  seizures, syncope, paralysis.  RESPIRATORY:  No shortness of breath,  productive cough, or hemoptysis.  CARDIOVASCULAR:  No chest pain, angina, or  orthopnea.  GI:  No nausea, vomiting, diarrhea, or constipation.  GU:  No  dysuria, hematuria, or discharge.  MUSCULOSKELETAL:  Left hip.   PHYSICAL EXAMINATION:  VITAL SIGNS:  Pulse 76, respirations 12, blood  pressure 134/62.  GENERAL:  A 77 year old white female who is well-developed and well-  nourished in no acute distress.  Alert, oriented and cooperative.  Pleasant.  Appears to be a good historian.  HEENT:  Normocephalic and atraumatic.  Pupils are round and reactive.  Oropharynx is clear.  EOMs are intact.  She has never worn glasses.  She  does have full upper  and lower denture plates.  NECK:  Supple.  CHEST:  Clear.  HEART:  Regular rate and rhythm without murmur.  ABDOMEN:  Soft.  Slightly round.  Nontender.  Bowel sounds present.  RECTAL/BREASTS/GENITALIA:  Not done.  Not pertinent to the present illness.  EXTREMITIES:  Left hip:  The left hip is __________ flexion with 100  degrees, internal rotation 20, external rotation 30, abduction 30.  Motor  function is intact.   IMPRESSION:  Loose left acetabular component.   PLAN:  Patient admitted to [facility] to undergo revision of left  acetabular component.  Surgery will be performed by Dr. Trudee Grip.      Alexzandrew L. Julien Girt, P.A.      Ollen Gross, M.D.  Electronically Signed    ALP/MEDQ  D:  04/18/2005  T:  04/18/2005  Job:  [id number]   cc:   Molly Maduro A. Nicholos Johns, M.D.  Fax: [phone]   [employer] [facility], M.D.  Fax: [phone]

## 2010-09-21 NOTE — Op Note (Signed)
NAME:  Tiffany [name], Tiffany [name]                      ACCOUNT NO.:  [id number]   MEDICAL RECORD NO.:  [id number]                   PATIENT TYPE:  AMB   LOCATION:  DAY                                  FACILITY:  [facility]   PHYSICIAN:  Angelia Mould. Derrell Lolling, M.D.             DATE OF BIRTH:  01-29-1927   DATE OF PROCEDURE:  01/05/2003  DATE OF DISCHARGE:                                 OPERATIVE REPORT   PREOPERATIVE DIAGNOSES:  Chronic cholecystitis with cholelithiasis.   POSTOPERATIVE DIAGNOSES:  Chronic cholecystitis with cholelithiasis.   OPERATION PERFORMED:  Laparoscopic cholecystectomy with intraoperative  cholangiogram.   SURGEON:  Angelia Mould. Derrell Lolling, M.D.   FIRST ASSISTANT:  Anselm Pancoast. Zachery Dakins, M.D.   OPERATIVE INDICATIONS:  This is a 77 year old white female, who has a two  month history of abdominal pain which is intermittent.  This was  nonlocalized initially, but then she had a bit more right-sided pain.  A  gallbladder ultrasound was performed on November 05, 2002, showing multiple  mobile gallstones measuring up to 1 cm in diameter.  Common bile duct was  normal.  She is brought to the operating room electively.   OPERATIVE FINDINGS:  The gallbladder was chronically inflamed, somewhat  discolored, and had moderate adhesions to it.  The liver looked healthy.  The stomach and duodenum, large intestine and small intestine and peritoneal  surfaces looked normal.  The anatomy of the cystic duct and cystic artery  were conventional.  The intraoperative cholangiogram showed normal  intrahepatic and extrahepatic bile ducts, no filling defect, and prompt flow  of contrast into the duodenum.   OPERATIVE TECHNIQUE:  Following the induction of general endotracheal  anesthesia, the patient's abdomen was prepped and draped in a sterile  fashion.  Marcaine 0.5% with epinephrine was used as a local infiltration  anesthetic.  A vertically-oriented incision was made at the lower rim of the  umbilicus.  The fascia was incised in the midline and the abdominal cavity  entered under direct vision.  A 10 mm Hasson trocar was inserted and secured  with a pursestring suture of 0 Vicryl.  Pneumoperitoneum was created.  Video  camera was inserted with visualization and findings as described above.  A  10 mm trocar was placed in the subxiphoid region and two 5 mm trocars placed  in the right mid abdomen.  The gallbladder fundus was identified and  elevated.  Adhesions were taken down.  The duodenum was a bit stuck to the  infundibulum, but we were able to dissect this away atraumatically.  We  dissected out the cystic duct and the cystic artery.  The cystic artery was  isolated as it went onto the gallbladder wall, was secured with multiple  metal clips, and divided.  We skeletonized the cystic duct and created a  large window behind the cystic duct.  The cystic duct was secured with a  metal clip close to the gallbladder.  A cholangiogram catheter was inserted  into the cystic duct.  Fluorocholangiography was performed using the C-arm.  This was a normal cholangiogram.  The intrahepatic and extrahepatic bile  ducts looked normal.  There was no filling defect, and the contrast went  into the duodenum nicely.  The cholangiogram catheter was removed.  The  cystic duct was secured with multiple metal clips and divided.  The  gallbladder was dissected from its bed with electrocautery and removed.  We  had a little bit of bleeding on the gallbladder bed near the fundus which  was controlled with electrocautery.  We then copiously irrigated the  operative field and at the completion of the case, there was absolutely no  bleeding and no bile leak whatsoever.  The irrigation fluid was clear.  The  trocars were removed under direct vision.  There was no bleeding from the  trocar sites.  The pneumoperitoneum was released.  The fascia at the  umbilicus was closed with 0 Vicryl sutures.  Skin  incisions were closed with  subcuticular sutures of 4-0 Vicryl and Steri-Strips.  Clean bandages were  placed and the patient taken to the recovery room in stable condition.  Estimated blood loss was about 20 mL.  Complications none.  Sponge, needle,  and instrument counts were correct.                                               Angelia Mould. Derrell Lolling, M.D.    HMI/MEDQ  D:  01/05/2003  T:  01/05/2003  Job:  [id number]   cc:   Molly Maduro A. Nicholos Johns, M.D.  [address]  [place]  [place] [address]  Fax: [phone]

## 2010-09-21 NOTE — Op Note (Signed)
[place]  Patient:    Tiffany [name], Tiffany [name]                     MRN: [id number] Adm. Date:  [id number] Disc. Date: [id number] Attending:  Ellwood Handler CC:         Verl Dicker, M.D.  (Office)                           Operative Report  DATE OF BIRTH:  24-May-1926.  REFERRING PHYSICIAN:  Molly Maduro A. Eliezer Lofts., M.D.  UROLOGIST:  Verl Dicker, M.D.  PREOPERATIVE DIAGNOSIS:  Urethral polyp with intractable urinary urgency and frequency.  POSTOPERATIVE DIAGNOSIS:  Urethral polyp with intractable urinary urgency and frequency.  OPERATIONS PERFORMED: 1. Excision of urethral polyp. 2. Urethral dilation. 3. Bladder hydrodistention with cystoscopy.  SURGEON:  Verl Dicker, M.D.  ANESTHESIA:  General.  DRAINS:  Eighteen French Foley with a leg bag.  SPECIMENS:  Urethral polyp.  DESCRIPTION OF PROCEDURE:  The patient was prepped and draped in the dorsal lithotomy position after institution of an adequate level of general anesthesia  (LMA).  Care was taken in positioning the patient due to her prosthetic hip.  A large polyp was easily visualized protruding from the six oclock position at he urethra.  Five-0 chromic stitch was placed in the superior aspect of the polyp o provide traction.  A delicate incision was made in the base of the polyp and closed with three interrupted sutures of 5-0 chromic.  The polyp was then sent as a specimen.  The urethra was then gently dilated to a #32 [place].  The bladder was carefully  inspected with the panendoscope, otherwise there was a normal urethra and trigone, Orifices were in the A position and effluxing clear urine.  The bladder showed o evidence of tumor, stone, bleeding site, ulcer, or glomerulation. Capacity under anesthesia with hydrodistention; 600 cc.  The patient had been covered for the instrumentation with intravenous gentamicin.  The bladder was  drained.  The cystoscope was removed.  An 56 French Foley catheter was inserted and left to straight drain with a leg bag.  The patient was taken o recovery in satisfactory condition. DD:  09/04/99 TD:  09/05/99 Job: [id number]

## 2010-09-21 NOTE — H&P (Signed)
   NAME:  Tiffany [name], YEARICK NO.:  [id number]   MEDICAL RECORD NO.:  [id number]                   PATIENT TYPE:   LOCATION:                                       FACILITY:   PHYSICIAN:  Guadelupe Sabin, M.D.             DATE OF BIRTH:  [birth year]   DATE OF ADMISSION:  02/23/2002  DATE OF DISCHARGE:                                HISTORY & PHYSICAL   REASON FOR ADMISSION:  This was a planned outpatient surgical admission of  this 77 year old white female admitted for cataract implant surgery of the  left eye.   HISTORY OF PRESENT ILLNESS:  This patient has been followed in my office  since February 09, 1996. At that time the patient had been told by a doctor in  [place] that she had cataract formation in both eyes and was complaining of  blurred vision. Examination confirmed the presence of bilateral cataract  formation. The patient, however, was given spectacle correction and her  vision improved to 20/40. Over the ensuing years, however, the patient has  continued to note dim vision, blurred vision and now feels that she is ready  for cataract implant surgery of the left eye. She was given oral discussion  and printed information concerning the procedure and its possible  complications. She signed an informed consent and arrangements were made for  her outpatient admission.   PAST MEDICAL HISTORY:  The patient is under the care of Dr. Nicholos Johns. She is  felt to be in good stable general health.   CURRENT MEDICATIONS:  1. Avapro.  2. Toprol.  3. Norvasc.  4. Aspirin.  5. Vioxx for chronic arthritis.   REVIEW OF SYSTEMS:  No cardiorespiratory complaints.   PHYSICAL EXAMINATION:  VITAL SIGNS:  As recorded on admission, blood  pressure 130/70, respirations 16, heart rate 74, temperature 97.5.  GENERAL:  The patient is a pleasant well developed, well nourished 74-year-  old white female in no acute distress.  HEENT:  Eyes, visual acuity with best  direction 20/50+ right eye, 20/70-  left eye. Slit lamp examination, nuclear cataract formation, both eyes.  Applanation tonometry normal, 15 mm each eye. Fundus examination, the  vitreous is clear with cataract lens haze. The retina is attached with  normal optic nerve, blood vessels and macula.   ADMISSION DIAGNOSIS:  Senile nuclear cataract, both eyes.   SURGICAL PLAN:  Cataract implant surgery, left eye now, right eye later as  needed.                                              Guadelupe Sabin, M.D.   HNJ/MEDQ  D:  02/23/2002  T:  02/23/2002  Job:  [id number]   cc:   Molly Maduro A. Nicholos Johns, M.D.

## 2010-09-21 NOTE — Op Note (Signed)
[facility]  Patient:    Tiffany [name], Tiffany [name]                     MRN: [id number] Adm. Date:  [id number] Disc. Date: [id number] Attending:  Ellwood Handler CC:         Verl Dicker, M.D.                           Operative Report  REDICTATION  DATE OF BIRTH:  [birth year].  PREOPERATIVE DIAGNOSES:  Urethral polyp, interstitial cystitis.  POSTOPERATIVE DIAGNOSES:  Urethral polyp, interstitial cystitis.  PROCEDURE:  Cystoscopy, hydraulic dilatation of the bladder and excision of urethral polyp.  ANESTHESIA:  General.  DRAINS:  An 18-French Foley.  SPECIMENS:  Urethral polyp.  DESCRIPTION OF PROCEDURE:  Patient was prepped and draped in the dorsal lithotomy position after institution of an adequate level of general anesthesia.  Urethra was calibrated and dilated to a #26-French; single polyp easily visualized at the 6 oclock position.  Polyp was gently incised at its base.  Base was closed with interrupted sutures of 5-0 chromic; a total of three interrupted stitches were used.  Polyp was then sent directly to pathology.  Once base of the polyp had been closed, cystoscope was introduced in the bladder; normal urethra and sphincter, normal trigone and orifices, fine glomerulations noted across the posterior bladder wall.  Bladder was then dilated under anesthesia, initially to a volume of 600 cc, then 800 cc, then 1000 cc; there was no evidence of bladder perforation.  Bladder was drained. An 18-French Foley was inserted and left to straight drain and patient was returned to recovery in satisfactory condition. DD:  10/16/99 TD:  10/18/99 Job: [id number]

## 2010-09-21 NOTE — Discharge Summary (Signed)
NAMEMarland Kitchen  Tiffany [name], Tiffany [name]   MEDICAL RECORD NO.:  [id number]          PATIENT TYPE:  INP   LOCATION:  1514                         FACILITY:  [name]   PHYSICIAN:  Tiffany [name], M.D.    DATE OF BIRTH:  [birth year]   DATE OF ADMISSION:  09/28/2004  DATE OF DISCHARGE:                                 DISCHARGE SUMMARY   ADMITTING DIAGNOSES:  1.  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  2.  Hypertension.  3.  History of constipation.   DISCHARGE DIAGNOSES:  1.  Left acetabular component loosening with fracture status post left      acetabular and femoral head revision with bone grafting.  2.  Hypertension.  3.  History of constipation.   PROCEDURE:  Date of surgery Oct 01, 2004:  Left acetabular and femoral head  revision with bone grafting. Surgeon:  Tiffany [name]. Assistant:  Tiffany [name], P.Tiffany.-C. Anesthesia:  General.  Blood loss:  300 mL. Hemovac drain  x1.   BRIEF HISTORY:  Tiffany [name] is Tiffany 77 year old female with Tiffany left total hip  arthroplasty done several years ago by Tiffany [name], then subsequently had an  acetabular revision. Over the past week-and-Tiffany-half or so she has had severe  groin pain and inability to bear weight. Initial plain radiographs did not  show any abnormality but later on in the week her pain increased. Tiffany bone  scan was obtained which showed Tiffany tremendous intensity in the acetabular  region more indicative of loosening. Then, repeat films showed that the  acetabular component had fractured through the medial wall and anterior  column with hardware failure of the screws. With this noted, she was sent  over to the hospital for admission.   LABORATORY DATA:  CBC on admission:  Hemoglobin of 12.6, hematocrit of 37.7,  white cell count 9.8, elevated neutrophils 78, lymphs 13, monos 7, eos 1,  basos 2. Postoperative hemoglobin after surgery 10.9, dropped down to 8.7,  back up to 8.9. Last noted H&H was 9.0  and 26.2. Sed rate on admission 98.  PT/PTT on admission 12.3 and 28 respectively with an INR of 0.9. Serial  protimes followed. Last noted PT/INR 19.7 and 2.1. BMET on admission:  Elevated BUN of 26, elevated glucose of 162. Remaining BMET within normal  limits. Serial BMETs were followed. Sodium dropped from 139 to 133, back up  to 137. Glucose went up to 216, back down to 129. BUN came down to normal  level of 15. Urinalysis:  Amber color, small bili, hyaline casts, 0-2 white  cells, rare epithelial cells. Blood group/type Tiffany positive. Gram stain taken  at the time of the procedure on Oct 01, 2004 sent off for STAT gram stain:  Moderate wbc's predominantly PMN, no organisms seen. Wound culture taken on  Oct 01, 2004 showed no organisms, no growth. Anaerobic culture on Oct 01, 2004:  No organisms, no anaerobes isolated.   X-rays:  Two-view chest on Sep 28, 2004:  Stable chronic interstitial  changes with biapical pleural and parenchymal scarring, stable mild cardiac  enlargement, no acute  overlying pulmonary findings. Left hip film on Sep 28, 2004:  Left total hip prosthesis with complication of the acetabular  component. Two of the acetabular screws are broken. There is also suggestion  of acetabular fracture inferior to the acetabular component. 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 Hip films on Oct 01, 2004:  Status post  left total hip arthroplasty revision with improvement of alignment of  acetabular and femoral components, chronic changes of the left pelvis.   EKG dated Sep 28, 2004:  Sinus rhythm with frequent premature ventricular  complexes, new since prior; otherwise, normal - confirmed by Tiffany [name]. EKG on Oct 01, 2004:  Normal sinus rhythm, otherwise normal EKG;  left atrial enlargement since Sep 28, 2004; premature ventricular complexes  no longer present - confirmed by Tiffany [name].   HOSPITAL COURSE:  The patient was admitted to [name] on Sep 28, 2004 by Tiffany [name] who was covering for Dr. Ollen [name]. The  patient was placed at bedrest, pain medications, placed in traction.  Remained at bedrest for Tiffany couple of days until she was evaluated by Dr.  Lequita [name] and special equipment had to be ordered for the acetabular revision.  Once the equipment was arranged and the patient was prepped, she was taken  to surgery on Oct 01, 2004 and underwent the above procedure without  complication. The patient tolerated the procedure well, later transferred to  the recovery room and back to the orthopedic floor where she was placed back  on her pain medications. She was initially on bedrest but physical therapy  was ordered postoperatively. She was starting to get up out of bed. Felt  that she would probably progress slowly due to the significant nature of the  surgery and her being only touchdown weightbearing. Therefore, rehab  services were consulted postoperatively. She was seen by rehab and felt that  she would be an appropriate candidate for either T[facility] [name] versus skilled  nursing facility. Plans were discussed with the patient and son. They  preferred skilled nursing facility. Therefore, discharge planning continued  to look for bed offers. On day #1, the patient was doing quite well. She had  Tiffany little bit of elevated glucose. Therefore, her CBGs were taken, the  dextrose was removed from the fluids. That did improve. Serum glucose came  back down to 129. Dressing change was initiated on postoperative day #2.  Incision was healing well. The patient was doing better by day #2, had Tiffany  little bit of nausea on day #1 but that had resolved by day #2. Started to  encourage her to get up out of bed due to complaints of stiffness. Hep-Lock  IV. Hemoglobin was 8.7 on day #1, back to 8.9 on day #2. By day #3, the  patient was doing better with her pain control and started to get up out of bed. Discharge planning was looking  for skilled nursing facility. Continued  care. By October 05, 2004 several bed offers have been made at two or three  different facilities. They were awaiting the final decision on the patient  but felt that Tiffany decision would be made by today. Once decision is made and  arrangements are made, the patient will be transferred over at that time.   DISCHARGE PLAN:  1.  The patient will be transferred to skilled nursing facility of choice      pending decision by family today.  2.  Discharge diagnoses:  Please see above.  3.  Discharge medications:  Current medications include:  Toprol-XL 100 mg p.o. daily.  Tiffany.  Avapro 300 mg daily.  b.  Colace 100 mg p.o. b.i.d.  c.  Coumadin protocol. Monitor protime/INRs, titrate the Coumadin for an INR  between 2.0 and 3.0.  d.  Senokot-S p.o. p.r.n.  e.  Laxative of choice/enema of choice.  f.  Percocet one or two q.4-6h. as needed for pain.  g.  Tylenol one or two q.4-6h. as needed for mild pain, temperature, or  headache.  h.  Robaxin 500 mg p.o. q.6-8h. p.r.n. spasm.  1.  Restoril 15-30 p.o. q.h.s. p.r.n. sleep.      1.  Dulcolax p.o. p.r.n.   ACTIVITY:  The patient is strict touchdown weightbearing - do not advance  weightbearing status. She is touchdown weightbearing to the left lower  extremity, needs to be up out of bed minimum b.i.d. Continue physical  therapy and occupational therapy for gait training ambulation and ADLs. She  may start showering. Daily dressing changes to the left hip incision. Hip  precautions at all times.   FOLLOW-UP:  The patient is to follow up with Dr. Lequita [name] in the office about  2 to 2-and-Tiffany-half weeks following surgery. Please contact the office at [phone] to arrange appointment time and transfer of the patient.   DISPOSITION:  Skilled nursing facility of choice. There have been three bed  offers made. Decision is pending at the time of this dictation.   CONDITION ON DISCHARGE:  Improving.      ALP/MEDQ   D:  10/05/2004  T:  10/05/2004  Job:  [id number]   cc:   Molly Maduro Tiffany. Nicholos Johns, M.D.  [address]  [place]  [place] [address]  Fax: [phone]   To SNF with the patient

## 2010-09-21 NOTE — Discharge Summary (Signed)
NAMEMarland [name]  CLOTIEL, TROOP NO.:  [id number]   MEDICAL RECORD NO.:  [id number]          PATIENT TYPE:  ORB   LOCATION:  4533                         FACILITY:  [facility]   PHYSICIAN:  Ellwood Dense, M.D.   DATE OF BIRTH:  12/21/1926   DATE OF ADMISSION:  04/16/2005  DATE OF DISCHARGE:  04/23/2005                                 DISCHARGE SUMMARY   DISCHARGE DIAGNOSES:  1.  Acetabular loosening redo hip arthroplasty.  2.  Hypertension.  3.  Mental status improved.   HISTORY OF PRESENT ILLNESS:  Tiffany [name] is a 77 year old female with  history of hypertension, DJD, left total hip replacement, multiple revision,  last May 2006.  She was readmitted December 8 for total hip replacement  revision secondary to loosening of acetabular component.  She underwent  surgery by Dr. Lequita Halt and postop was made touchdown weightbearing with  Coumadin being used for DVT prophylaxis.  INR therapeutic currently.  Therapies initiated, and patient noted impairment in mobility and self care.  Subacute was complete for further therapy.   PAST MEDICAL HISTORY:  Significant for:  1.  Hypertension.  2.  Bronchitis.  3.  Frequent UTI.  4.  Question history of interstitial cystitis in the past.  5.  Bilateral mastectomy for fibrocystic disease.  6.  Hysterectomy.  7.  Left total hip replacement in 1992 with revision in 1996, 1998, and May      2006.   ALLERGIES:  SULFA, CODEINE, MORPHINE, and ULTRAM.   FAMILY HISTORY:  Positive for diabetes.   SOCIAL HISTORY:  The patient lives alone in one-level home with two steps at  entry.  Was independent and using a cane for the past few weeks prior to  admission.  She does not use any tobacco or alcohol.   HOSPITAL COURSE:  Tiffany [name] was admitted to subacute on April 16, 2005, for SACU level therapy to consist of PT, OT daily.  Past  admission, she was able to maintain touchdown weightbearing status without  difficulty.  She was  maintained on Coumadin for DVT prophylaxis and would  continue on this to January 8 to complete DVT prophylaxis course.  Pain  control was managed with OxyContin CR and p.r.n. use of OxyIR.  The  patient's incision noted to be healing well without any signs or symptoms of  infection.  She was noted to have serous drainage from lower aspect of hip  incision.  She is to continued keeping this area clean and dry and continue  with dry dressing until drainage resolves.  Staples were kept in place with  staples to be removed on December 21 by home health RN.   Labs check past admission showed postop anemia to be stable with hemoglobin  9.1, hematocrit 26.2.  Check of electrolytes initially showed renal  insufficiency with creatinine at 1.3.  The patient's Avapro was held, and  blood pressure was monitored on twice daily basis.  Blood pressure noted to  be controlled at 100 to 130 systolic, 40s to 47W diastolic.  Heart rate has  been stable.  Recheck electrolytes last February  19 revealed sodium 137,  potassium 4.1, chloride 104, CO2 28, BUN 12, creatinine 1.1, glucose 115.  Patient to follow up with a local physician for recheck of blood pressure  and regarding resumption of Avapro.   Progress has been very moderate.  By time of discharge, her progress noted  to be modified independent level for self care, modified independent for  transfers, modified independent for ambulating up to 150 feet with a rolling  walker, requiring minimum assistance to navigate s3 stairs.  Followup  therapy to include home health PT and OT by [facility],  home health R.N. for pro time draws and [facility] for monitoring of  Coumadin.  On April 23, 2005, the patient is discharged to home.   DISCHARGE ONE MEDICATIONS:  1.  Coumadin 2 mg 1-1/2 p.o. daily.  2.  OxyContin CR 10 mg q.12h. x5 days, decreased to __________.  3.  OxyIR 5 to 10 mg q.4-6 h. p.r.n. pain.  4.  Toprol XL 50 mg q  day.  5.  Robaxin 500 mg p.o. 4 times a day p.r.n. spasm.  6.  Os-Cal Plus D 1 p.o. 3 times a day.  7.  Senokot S 2 p.o. nightly.   ACTIVITY:  Use of walker, touchdown weightbearing, and hip precautions on  left lower extremity.   DIET:  Regular.   WOUND CARE:  Wash are with soap and water.  Keep area dry.   SPECIAL INSTRUCTIONS:  1.  Continue to check blood pressure daily.  2.  Do not use Avapro for now.   FOLLOW UP:  1.  Patient to follow up with Dr. Sherlean Foot for postop check.  2.  Follow up with Dr. Renato Gails for blood pressure check.  3.  Follow up with Dr. Lamar Benes as needed.      Greg Cutter, P.A.    ______________________________  Ellwood Dense, M.D.   PP/MEDQ  D:  06/12/2005  T:  06/12/2005  Job:  [id number]   cc:   Alanson Puls, M.D.  Fax: [phone]   Ollen Gross, M.D.  Fax: [phone]

## 2010-09-21 NOTE — Op Note (Signed)
NAME:  Tiffany [name], Tiffany [name] NO.:  [id number]   MEDICAL RECORD NO.:  [id number]          PATIENT TYPE:  INP   LOCATION:  X010                         [facility]ACILITY:  [facility]   PHYSICIAN:  Ollen Gross, M.D.    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   PROCEDURE:  Left acetabular revision.   SURGEON:  Ollen Gross, M.D.   ASSISTANT:  Madlyn Frankel. Charlann Boxer, M.D.   ANESTHESIA:  General.   ESTIMATED BLOOD LOSS:  300.   DRAINS:  Hemovac x1.   COMPLICATIONS:  None.   CONDITION:  Stable to recovery.   BRIEF CLINICAL NOTE:  Tiffany [name] is a 77 year old female whose had  multiple problems in regards to left hip replacements in the past. She had  an acetabular revision in May of this year with bone grafting. She was  extremely well until a few weeks ago when she noticed increased pain in her  left hip. A radiograph revealed that a component had shifted. She has had  progressively worsening symptoms. She presents now for acetabular revision.   DESCRIPTION OF PROCEDURE:  After successful administration of general  anesthetic, the patient was placed in the right lateral decubitus position  with the left side up and held with a hip positioner. The left lower  extremity was isolated from her perineum with plastic drapes and prepped and  draped in the usual sterile fashion. Previous posterolateral incisions were  utilized, skin cut with a 10 blade through the subcutaneous tissue to the  level of the fascia lata which is incised in line with the skin incision.  Immediately upon getting through the fascia lata, we noted that the  acetabular component had shifted into marked anteversion. I was able to  dislocate the hip and  remove the femoral head and then retract the femur  anteriorly. The femoral component looked fine. It was stable to torsional  testing. The femur was retracted anteriorly to gain acetabular exposure. The  cup was essentially hinging on a single screw. I was able to remove the  polyethelene liner and then remove that screw. The component then easily  came out. A lot of the bone graft from the previous procedure was not  incorporated and I thoroughly irrigated and removed that unincorporated bone  graft. We were left with an intact posterior column, intact posterior wall,  intact superior column but absent anterior column which was consistent with  the defect she had previously. The size of the shell that was removed was a  66 pinnacle revision shell. I was able to ream to 66 mm just to freshen up  the base of the defect. We then took 25 mL of cancellous allograft using the  IC bone graft chamber mixed with symphony gel. The graft was placed  anteriorly and medially. This lead to a nice concentric acetabulum. We then  put a  trial 70 mm shell and had very good fit with a good stable rim fit. We  then placed the 70 mm Zimmer trabecular metal revision multihole shell. It  had good purchase on impaction and then placed an additional 4 dome screws,  each with good purchase. I then rethreaded the impactor into the shell and  the entire unit was moving as one with the pelvis consistent with a good  stable fit. We then placed a 36 mm neutral polyethylene liner and replaced  the 36 plus 5 femoral head. The hip was reduced with excellent stability,  full extension, full external rotation, 70 degrees of flexion, 40 degrees  adduction, 90 degrees internal rotation and 90 degrees flexion and about 70  degrees internal rotation. By placing the left leg on top of the right, it  felt as though the leg lengths were equal. We then thoroughly irrigated the  hip and were unable to reattach any tissue to the femur  thus close the  fascia lata over a hemovac drain with interrupted #1 Vicryl, subcu closed  with #1 and 2-0 Vicryl and skin with staples. Drains hooked to suction,  incision cleaned and dried and bulky sterile dressing applied. She was  placed into a knee immobilizer, awakened and transported to recovery in  stable condition.      Ollen Gross, M.D.  Electronically Signed     FA/MEDQ  D:  04/12/2005  T:  04/12/2005  Job:  [id number]

## 2010-09-21 NOTE — Discharge Summary (Signed)
NAMEMarland [name]  LILLYANNE, BRADBURN NO.:  [id number]   MEDICAL RECORD NO.:  [id number]          PATIENT TYPE:  INP   LOCATION:  1518                         FACILITY:  [facility]   PHYSICIAN:  Ollen Gross, M.D.    DATE OF BIRTH:  May 20, 1926   DATE OF ADMISSION:  04/12/2005  DATE OF DISCHARGE:  04/16/2005                                 DISCHARGE SUMMARY   ADMISSION DIAGNOSES:  1.  Loose left acetabular component.  2.  Fibrocystic breast disease.  3.  History of bladder infection.  4.  Hypertension.  5.  History of bronchitis.   DISCHARGE DIAGNOSES:  1.  Failed acetabular component, left total hip, status post left hip      acetabular revision.  2.  Fibrocystic breast disease.  3.  History of bladder infection.  4.  Hypertension.  5.  History of bronchitis.  6.  Postoperative blood loss anemia, did not require transfusion.   PROCEDURE:  On April 12, 2005, left acetabular revision.   SURGEON:  Ollen Gross, M.D.   ASSISTANT:  Madlyn Frankel. Charlann Boxer, M.D..   ANESTHESIA:  General.   BLOOD LOSS:  300 cc.   DRAINS:  Hemovac x1.   BRIEF HISTORY:  Ms. Prew is a 77 year old female with multiple problems  in regards to her left hip.  Acetabular revision in May, 2006 with bone  graft.  She did extremely well until a few weeks ago when she noticed some  increased pain.  Radiograph revealed that the component had shifted.  Now  presents for revision.   CONSULTS:  Rehab services.   LABORATORY DATA:  Preop CBC:  Hemoglobin 13, hematocrit 39, white cell count  6.  Postop hemoglobin 10.3, drifted down to 9.4.  Last noted H&H was 9 and  26.7.  PT/PTT preop was 13.6 and 26, respectively.  INR 1.  Serial pro times  followed:  Last noted PT/INR 27.5 and 2.6.  Chem panel on admission:  Elevated BUN of 27, elevated ALP of 118.  Mildly elevated glucose of 128.  Remaining chem panel within normal limits.  Serial BMETs were followed.  Glucose went up to 198, back down to 138.  BUN came  down to normal level at  15.  Remaining electrolytes remained within normal limits.  Preop UA:  Moderate leukocyte esterase, 36 white cells, 0-2 red cells.  Blood group  type A+.   HOSPITAL COURSE:  Admitted to [facility].  Tolerated the procedure  well.  Later was transferred to the recovery room, then to the orthopedic  floor.  Started on PCA and p.o. analgesics.  Hemovac drain placed at the  time of surgery was pulled.  She was placed on touchdown weightbearing due  to the extensive nature of the acetabular revision.  Started physical  therapy postop.  On day #2, she had been running a little mild temp.  Encouraged pulmonary toilet.  Foley was DC'd.  Dressing was changed on day  #2.  Incision was healing well.  Hemoglobin was down to about 9.4, although  she was asymptomatic with this.  Started getting up with physical therapy  a  little more.  She did get up about 3 feet, short distances only.  Due to her  slow progress, rehab services were consulted.  The patient was seen in  consultation by rehab [facility].  It was felt she would be an appropriate  candidate for inpatient rehab.  It was decided she would be transferred, at  which time a bed became available.  By day #3, she was slowly improving and  ambulated approximately 15 feet.  By day #4, her pain was under much better  control.  The PCA had been discontinued.  She had been weaned over to p.o.  meds.  Felt to be a good candidate.  The incision was healing well.  A bed  became available later that day.  She was transferred to the [facility] on  April 16, 2005.   DISCHARGE PLAN:  1.  Patient was discharged to [facility].  2.  Discharge diagnoses:  Please see above.  3.  Discharge meds:  Continue current medications as per the [facility], which will      be sent over with the patient.  4.  Diet:  Continue current diet.  5.  Activity:  Touch-down weightbearing.  Hip precautions at all times.      Touchdown to the left lower  extremity.  Do not increase weightbearing.      Daily dressing changes.  PT/OT for gait training, ambulation, and ADLs.  6.  Follow up two weeks from surgery or following the discharge from the      rehab unit.   DISPOSITION:  [facility].   CONDITION ON DISCHARGE:  Orthopedically improving.      Alexzandrew L. Julien Girt, P.A.      Ollen Gross, M.D.  Electronically Signed    ALP/MEDQ  D:  06/12/2005  T:  06/12/2005  Job:  [id number]   cc:   Molly Maduro A. Nicholos Johns, M.D.  Fax: [phone]   [place] [facility], M.D.  Fax: [phone]   Rehab Services

## 2010-09-21 NOTE — Op Note (Signed)
NAMEMarland Kitchen  Tiffany [name], Tiffany [name] NO.:  [id number]   MEDICAL RECORD NO.:  [id number]          PATIENT TYPE:  INP   LOCATION:  1514                         FACILITY:  [facility]   PHYSICIAN:  Ollen Gross, M.D.    DATE OF BIRTH:  1926/06/02   DATE OF PROCEDURE:  10/01/2004  DATE OF DISCHARGE:                                 OPERATIVE REPORT   PREOPERATIVE DIAGNOSIS:  Left acetabular component loosening with fracture.   POSTOPERATIVE DIAGNOSIS:  Left acetabular component loosening with fracture.   PROCEDURE:  Left acetabular femoral head revision with bone grafting.   SURGEON:  Ollen Gross, M.D.   ASSISTANT:  Alexzandrew L. Julien Girt, P.A.   ANESTHESIA:  General.   ESTIMATED BLOOD LOSS:  300.   DRAINS:  Hemovac times one.   COMPLICATIONS:  None.   CONDITION:  Stable to recovery.   BRIEF CLINICAL NOTE:  Tiffany [name] is a 77 year old female who had a left  total hip arthroplasty done several years by Dr. Fannie Knee and then subsequently  had an acetabular revision. Over the past week and a half or so, she has had  severe groin pain and inability to bear weight. Initial plain radiographs  did not show any abnormality but later on in the week as her pain increased,  we obtained a bone scan which showed a tremendous intensity in the  acetabulum and then repeat plain film showed that her acetabular component  had fractured through her a medial wall and anterior column and had broken  the screws. She had been in intense pain and presents now for acetabular  revision with bone grafting and femoral head revision.   PROCEDURE IN DETAIL:  After successful administration of general anesthetic,  the patient was placed in the right lateral decubitus position with the left  side up and held with the hip positioner. Left lower extremity was isolated  from her perineum with plastic drapes and prepped and draped in usual  sterile fashion. Previous posterolateral incisions were utilized, skin  cut  with a 10 blade through subcutaneous tissue to the level of fascia lata  which was incised in line with the skin incision. Sciatic nerve was palpated  and protected and the pseudocapsule excised off the femur. We immediately  encountered a large hematoma in the joint. We sent the fluid for stat Gram  stain, C&S which was negative. Thoroughly excised the tissue around the  periphery of the cup. We then removed the femoral head from the femoral  component. Femoral was in good position and was stable. We retracted the  femur anteriorly to improve our acetabular exposure. I was easily able to  remove the acetabular component just by grabbing it with a clamp and  removing it. It was grossly loose. Screws were all broken. Then removed the  membrane from the base of the bone and assessed the defect. Her posterior  wall and column were intact as is the superior column. Anteriorly she has no  bone left. Anterior superiorly there is also significant bone deficiency.  Given that the posterior column was intact and was not a discontinuity, I  felt we were able to address this defect with a jumbo acetabular shell and  bone grafting.   We then gently reamed the bone up to 63 mm. This was in anticipation of  placement of 66 mm acetabular shell. Then take 60 cc of cancellus allograft  and mixed it with the Symphony graft system.  We packed this large amount of  graft superior anterior and this effectively filled the defects as well was  created an anterior bony bridge. We then reverse reamed at 63 to give a nice  concentric bony bed for placement of the cup.  I used a 66 mm pinnacle  revision shell. We impacted it in anatomic position and actually had a very  stable fit. I then placed an additional two dome screws up into the superior  acetabulum as well as two peripheral screws with all screws having excellent  purchase. I then reinserted the impactor handle and attempted to move the  shell but the  shell and the pelvis were moving as a single unit consistent  with a stable cup. We then placed a 36-mm neutral plus 4 trial liner and  then upsized to a 36 plus 5 head. She a 26 mm construct in previously. With  the 36 plus 5 head, she had great stability with full extension, flexion,  rotation 70 degrees flexion, 40 degrees adduction, 90 degrees internal  rotation and 90 degrees of flexion and 70 degrees of internal rotation. By  placing the left leg on top of the right, leg lengths were essentially  equal. The hip was then dislocated and the trial liner head removed. The 36  mm neutral plus 4 Marathon liner was placed into the acetabular shell and  then a 36 plus 5 C tapered femoral head from [employer] was placed onto the  stem. The hip was then reduced with the same stability parameters. The wound  was copiously irrigated with saline solution and posterior soft tissues  reattached the femur through drill holes. Fascia was closed over Hemovac  drain with interrupted #1 Vicryl, subcu closed with #1-0 and #2-0 Vicryl and  skin with staples. Drains hooked to suction. Incision cleaned      FA/MEDQ  D:  10/01/2004  T:  10/01/2004  Job:  [id number]

## 2010-09-21 NOTE — H&P (Signed)
NAMEMarland Kitchen  Tiffany [name], PARDOE NO.:  [id number]   MEDICAL RECORD NO.:  [id number]          PATIENT TYPE:  ORB   LOCATION:  4533                         FACILITY:  [facility]   PHYSICIAN:  Erick Colace, M.D.DATE OF BIRTH:  Jun 22, 1926   DATE OF ADMISSION:  04/16/2005  DATE OF DISCHARGE:                                HISTORY & PHYSICAL   REASON FOR ADMISSION:  Declined self care and mobility skills status post  left total hip revision.   HISTORY OF PRESENT ILLNESS:  A 77 year old female with history of  hypertension, DJD.  She has had a left total hip replacement with multiple  revisions, last one in May 2006.  She was readmitted April 12, 2005 for  left total hip replacement revision secondary to loosening of acetabular  component.  She was made postoperatively touchdown weightbearing on Coumadin  for DVT prophylaxis.  Her INR was 2.6 today.   REVIEW OF SYSTEMS:  No chest pain, no shortness of breath, no nausea,  vomiting, diarrhea, or constipation.   PAST MEDICAL HISTORY:  1.  Hypertension.  2.  Bronchitis.  3.  Frequent UTIs.  4.  Questionable interstitial cystitis.  5.  Bilateral mastectomy.  6.  Hysterectomy.  7.  Left total hip replacement in 1992, revisions in 1996, 1998, and May      2006.   FAMILY HISTORY:  Positive for diabetes.   SOCIAL HISTORY:  Lives alone in one-level home, three steps to enter.  Independent prior to admission.  No tobacco, no ETOH.   FUNCTIONAL HISTORY:  Independent using a cane due to hip problems.   CURRENT FUNCTIONAL STATUS:  Impaired mobility and ADLs requiring physical  assistance.   HOME MEDICATIONS:  1.  Avapro 300 mg p.o. daily.  2.  Toprol 100 mg p.o. daily.  3.  Calcium.  4.  Percocet.   ALLERGIES:  1.  SULFA.  2.  CODEINE.  3.  MORPHINE.  4.  ULTRAM.   PHYSICAL EXAMINATION:  VITAL SIGNS:  Blood pressure 120/55, pulse 78,  respirations 20, temperature 99.2.  GENERAL:  She is in no acute distress.  HEENT:  Eyes anicteric, not injected.  External ENT normal.  NECK:  Supple without adenopathy.  LUNGS:  Respiratory effort is good, lungs clear.  HEART:  Regular rate and rhythm.  No murmurs, rubs, or extra sounds.  EXTREMITIES: No clubbing, cyanosis, or edema.  NEUROLOGIC:  Mood, memory, and affect are intact.  Sensation is normal.  Strength 5/5 bilateral upper extremities and right lower extremity.  Left  lower extremity has 3- at the hip flexor and knee extensor and 4- ankle  dorsiflexor and plantar flexor.   IMPRESSION:  1.  Left hip acetabular loosening, left total hip replacement revision,      postoperative day #5.  Touchdown weightbearing.  Start [place], PT, OT.  2.  Pain management with Percocet being used.  Start OxyContin CR for better      pain relief.  3.  Deep vein thrombosis prophylaxis. Continue Coumadin.  4.  Hypertension.  Continue Toprol.  5.  Acute blood loss anemia.  Recheck CBC in  morning.   Estimated length of stay is 7 to 10 days.   Prognosis for functional improvement good.      Erick Colace, M.D.  Electronically Signed     AEK/MEDQ  D:  04/16/2005  T:  04/16/2005  Job:  [id number]   cc:   Molly Maduro A. Nicholos Johns, M.D.  Fax: [phone]   [employer] [facility], M.D.  Fax: [phone]

## 2010-09-21 NOTE — Op Note (Signed)
[facility]  Patient:    Tiffany [name], Tiffany [name]                   MRN: [id number] Proc. Date: 05/02/00 Adm. Date:  [id number] Attending:  Douglass Rivers                           Operative Report  PREOPERATIVE DIAGNOSIS:       Vaginal intraepithelial neoplasia III.  POSTOPERATIVE DIAGNOSIS:      Vaginal intraepithelial neoplasia III.  PROCEDURE:                    Vaginal and perineal colposcopy with excision.  SURGEON:                      Douglass Rivers, M.D.  ANESTHESIA:                   IV sedation.  ESTIMATED BLOOD LOSS:         Minimal.  FINDINGS:                     Single vaginal lesion at the angle, which was excised.  PATHOLOGY:                    Vaginal biopsy.  DESCRIPTION OF PROCEDURE:     The patient was taken to the operating room and placed in the dorsal lithotomy position.  IV sedation was then induced.  The colposcope was set up.  A bivalve speculum was placed in the vagina. Colposcopy was performed.  The area of the previous biopsy was visualized. The vagina was bathed with acetic acid.  Mild acetowhite epithelium was noted. Lugols solution was then placed with careful attention to the area that had been previously seen in the office.  The remarkable Lugol absent area was unable to be seen compared to what was seen in the office earlier.  However, a Lugol absent area was visualized and was sharply excised.  The small amount of bleeding that was noted was treated with a figure-of-eight of 2-0 chromic. The remainder of the vagina was inspected.  The speculum was rotated and, again, no further Lugol absent areas were seen.  The colposcope was then refocused to the vulva and the perineum, which were bathed in a weak acetic acid solution.  No remarkable changes were seen.  Lugols solution was also painted on the posterior fourchette and no Lugol absent areas were noted.  The patient tolerated the procedure well.  Sponge, needle and  instrument counts were correct x 2.  She was transferred to the PACU in stable condition. DD:  05/02/00 TD:  05/02/00 Job: [id number]

## 2010-12-26 ENCOUNTER — Other Ambulatory Visit: Payer: Self-pay | Admitting: Orthopedic Surgery

## 2010-12-26 DIAGNOSIS — M549 Dorsalgia, unspecified: Secondary | ICD-10-CM

## 2011-01-08 ENCOUNTER — Ambulatory Visit
Admission: RE | Admit: 2011-01-08 | Discharge: 2011-01-08 | Disposition: A | Discharge status: Home / Self Care | Payer: No Typology Code available for payment source | Source: Ambulatory Visit | Attending: Orthopedic Surgery | Admitting: Orthopedic Surgery

## 2011-01-08 DIAGNOSIS — M549 Dorsalgia, unspecified: Secondary | ICD-10-CM

## 2011-01-08 MED ORDER — METHYLPREDNISOLONE ACETATE 40 MG/ML INJ SUSP (RADIOLOG
120.0000 mg | Freq: Once | INTRAMUSCULAR | Status: AC
  Administered 2011-01-08: 120 mg via EPIDURAL

## 2011-01-08 MED ORDER — IOHEXOL 180 MG/ML  SOLN
1.0000 mL | Freq: Once | INTRAMUSCULAR | Status: AC | PRN
  Administered 2011-01-08: 1 mL via EPIDURAL

## 2011-01-14 ENCOUNTER — Other Ambulatory Visit: Payer: Self-pay | Admitting: Orthopedic Surgery

## 2011-01-14 DIAGNOSIS — M549 Dorsalgia, unspecified: Secondary | ICD-10-CM

## 2011-02-26 ENCOUNTER — Other Ambulatory Visit: Payer: No Typology Code available for payment source

## 2011-03-01 ENCOUNTER — Ambulatory Visit
Admission: RE | Admit: 2011-03-01 | Discharge: 2011-03-01 | Disposition: A | Discharge status: Home / Self Care | Payer: No Typology Code available for payment source | Source: Ambulatory Visit | Attending: Orthopedic Surgery | Admitting: Orthopedic Surgery

## 2011-03-01 ENCOUNTER — Other Ambulatory Visit: Payer: Self-pay | Admitting: Orthopedic Surgery

## 2011-03-01 DIAGNOSIS — M549 Dorsalgia, unspecified: Secondary | ICD-10-CM

## 2011-03-01 MED ORDER — METHYLPREDNISOLONE ACETATE 40 MG/ML INJ SUSP (RADIOLOG
120.0000 mg | Freq: Once | INTRAMUSCULAR | Status: AC
  Administered 2011-03-01: 120 mg via EPIDURAL

## 2011-03-01 MED ORDER — IOHEXOL 180 MG/ML  SOLN
1.0000 mL | Freq: Once | INTRAMUSCULAR | Status: AC | PRN
  Administered 2011-03-01: 1 mL via EPIDURAL

## 2011-03-15 ENCOUNTER — Ambulatory Visit
Admission: RE | Admit: 2011-03-15 | Discharge: 2011-03-15 | Disposition: A | Discharge status: Home / Self Care | Payer: No Typology Code available for payment source | Source: Ambulatory Visit | Attending: Orthopedic Surgery | Admitting: Orthopedic Surgery

## 2011-03-15 DIAGNOSIS — M549 Dorsalgia, unspecified: Secondary | ICD-10-CM

## 2011-03-15 MED ORDER — METHYLPREDNISOLONE ACETATE 40 MG/ML INJ SUSP (RADIOLOG
120.0000 mg | Freq: Once | INTRAMUSCULAR | Status: AC
  Administered 2011-03-15: 120 mg via EPIDURAL

## 2011-03-15 MED ORDER — IOHEXOL 180 MG/ML  SOLN
1.0000 mL | Freq: Once | INTRAMUSCULAR | Status: AC | PRN
  Administered 2011-03-15: 1 mL via EPIDURAL

## 2011-09-16 ENCOUNTER — Other Ambulatory Visit: Payer: Self-pay | Admitting: Dermatology

## 2011-09-20 ENCOUNTER — Other Ambulatory Visit: Payer: Self-pay | Admitting: Orthopedic Surgery

## 2011-09-20 DIAGNOSIS — M545 Low back pain, unspecified: Secondary | ICD-10-CM

## 2011-10-08 ENCOUNTER — Ambulatory Visit
Admission: RE | Admit: 2011-10-08 | Discharge: 2011-10-08 | Disposition: A | Discharge status: Home / Self Care | Payer: No Typology Code available for payment source | Source: Ambulatory Visit | Attending: Orthopedic Surgery | Admitting: Orthopedic Surgery

## 2011-10-08 ENCOUNTER — Other Ambulatory Visit: Payer: Self-pay | Admitting: Orthopedic Surgery

## 2011-10-08 DIAGNOSIS — M545 Low back pain, unspecified: Secondary | ICD-10-CM

## 2011-10-08 MED ORDER — IOHEXOL 180 MG/ML  SOLN
1.0000 mL | Freq: Once | INTRAMUSCULAR | Status: AC | PRN
  Administered 2011-10-08: 1 mL via EPIDURAL

## 2011-10-08 MED ORDER — METHYLPREDNISOLONE ACETATE 40 MG/ML INJ SUSP (RADIOLOG
60.0000 mg | Freq: Once | INTRAMUSCULAR | Status: AC
  Administered 2011-10-08: 60 mg via EPIDURAL

## 2011-10-29 ENCOUNTER — Other Ambulatory Visit: Payer: Self-pay | Admitting: Orthopedic Surgery

## 2011-10-29 ENCOUNTER — Ambulatory Visit
Admission: RE | Admit: 2011-10-29 | Discharge: 2011-10-29 | Disposition: A | Discharge status: Home / Self Care | Payer: No Typology Code available for payment source | Source: Ambulatory Visit | Attending: Orthopedic Surgery | Admitting: Orthopedic Surgery

## 2011-10-29 DIAGNOSIS — M545 Low back pain, unspecified: Secondary | ICD-10-CM

## 2011-10-29 DIAGNOSIS — M549 Dorsalgia, unspecified: Secondary | ICD-10-CM

## 2011-10-29 MED ORDER — METHYLPREDNISOLONE ACETATE 40 MG/ML INJ SUSP (RADIOLOG
120.0000 mg | Freq: Once | INTRAMUSCULAR | Status: AC
  Administered 2011-10-29: 120 mg via EPIDURAL

## 2011-10-29 MED ORDER — IOHEXOL 180 MG/ML  SOLN
1.0000 mL | Freq: Once | INTRAMUSCULAR | Status: AC | PRN
  Administered 2011-10-29: 1 mL via EPIDURAL

## 2011-11-19 ENCOUNTER — Other Ambulatory Visit: Payer: No Typology Code available for payment source

## 2012-01-13 ENCOUNTER — Ambulatory Visit
Admission: RE | Admit: 2012-01-13 | Discharge: 2012-01-13 | Disposition: A | Discharge status: Home / Self Care | Payer: Medicare Other | Source: Ambulatory Visit | Attending: Orthopedic Surgery | Admitting: Orthopedic Surgery

## 2012-01-13 ENCOUNTER — Other Ambulatory Visit: Payer: Self-pay | Admitting: Orthopedic Surgery

## 2012-01-13 DIAGNOSIS — M549 Dorsalgia, unspecified: Secondary | ICD-10-CM

## 2012-01-13 MED ORDER — METHYLPREDNISOLONE ACETATE 40 MG/ML INJ SUSP (RADIOLOG
120.0000 mg | Freq: Once | INTRAMUSCULAR | Status: AC
  Administered 2012-01-13: 120 mg via EPIDURAL

## 2012-01-13 MED ORDER — IOHEXOL 180 MG/ML  SOLN
2.0000 mL | Freq: Once | INTRAMUSCULAR | Status: AC | PRN
  Administered 2012-01-13: 2 mL via EPIDURAL

## 2012-07-21 ENCOUNTER — Other Ambulatory Visit: Payer: Self-pay | Admitting: Orthopedic Surgery

## 2012-07-21 DIAGNOSIS — M4310 Spondylolisthesis, site unspecified: Secondary | ICD-10-CM

## 2012-07-21 DIAGNOSIS — M545 Low back pain, unspecified: Secondary | ICD-10-CM

## 2012-08-03 ENCOUNTER — Other Ambulatory Visit: Payer: Self-pay | Admitting: Orthopedic Surgery

## 2012-08-03 ENCOUNTER — Ambulatory Visit
Admission: RE | Admit: 2012-08-03 | Discharge: 2012-08-03 | Disposition: A | Discharge status: Home / Self Care | Payer: Medicare Other | Source: Ambulatory Visit | Attending: Orthopedic Surgery | Admitting: Orthopedic Surgery

## 2012-08-03 DIAGNOSIS — M4310 Spondylolisthesis, site unspecified: Secondary | ICD-10-CM

## 2012-08-03 DIAGNOSIS — M545 Low back pain, unspecified: Secondary | ICD-10-CM

## 2012-08-03 MED ORDER — METHYLPREDNISOLONE ACETATE 40 MG/ML INJ SUSP (RADIOLOG
120.0000 mg | Freq: Once | INTRAMUSCULAR | Status: AC
  Administered 2012-08-03: 120 mg via EPIDURAL

## 2012-08-03 MED ORDER — IOHEXOL 180 MG/ML  SOLN
1.0000 mL | Freq: Once | INTRAMUSCULAR | Status: AC | PRN
  Administered 2012-08-03: 1 mL via EPIDURAL

## 2012-08-20 ENCOUNTER — Other Ambulatory Visit: Payer: Medicare Other

## 2012-08-31 ENCOUNTER — Other Ambulatory Visit: Payer: Self-pay | Admitting: Orthopedic Surgery

## 2012-08-31 ENCOUNTER — Ambulatory Visit
Admission: RE | Admit: 2012-08-31 | Discharge: 2012-08-31 | Disposition: A | Discharge status: Home / Self Care | Payer: Medicare Other | Source: Ambulatory Visit | Attending: Orthopedic Surgery | Admitting: Orthopedic Surgery

## 2012-08-31 DIAGNOSIS — M545 Low back pain, unspecified: Secondary | ICD-10-CM

## 2012-08-31 MED ORDER — METHYLPREDNISOLONE ACETATE 40 MG/ML INJ SUSP (RADIOLOG
120.0000 mg | Freq: Once | INTRAMUSCULAR | Status: AC
  Administered 2012-08-31: 120 mg via EPIDURAL

## 2012-08-31 MED ORDER — IOHEXOL 180 MG/ML  SOLN
1.0000 mL | Freq: Once | INTRAMUSCULAR | Status: AC | PRN
  Administered 2012-08-31: 1 mL via EPIDURAL

## 2012-09-22 ENCOUNTER — Other Ambulatory Visit: Payer: Medicare Other

## 2012-10-03 ENCOUNTER — Emergency Department (HOSPITAL_COMMUNITY): Payer: Medicare Other

## 2012-10-03 ENCOUNTER — Encounter (HOSPITAL_COMMUNITY): Payer: Self-pay | Admitting: *Deleted

## 2012-10-03 ENCOUNTER — Inpatient Hospital Stay (HOSPITAL_COMMUNITY)
Admission: EM | Admit: 2012-10-03 | Discharge: 2012-10-08 | DRG: 378 | Disposition: A | Discharge status: Home Health | Payer: Medicare Other | Attending: Internal Medicine | Admitting: Internal Medicine

## 2012-10-03 DIAGNOSIS — Z885 Allergy status to narcotic agent status: Secondary | ICD-10-CM

## 2012-10-03 DIAGNOSIS — K648 Other hemorrhoids: Secondary | ICD-10-CM | POA: Diagnosis present

## 2012-10-03 DIAGNOSIS — Z79899 Other long term (current) drug therapy: Secondary | ICD-10-CM

## 2012-10-03 DIAGNOSIS — K625 Hemorrhage of anus and rectum: Secondary | ICD-10-CM | POA: Diagnosis present

## 2012-10-03 DIAGNOSIS — Z96659 Presence of unspecified artificial knee joint: Secondary | ICD-10-CM

## 2012-10-03 DIAGNOSIS — Z9010 Acquired absence of unspecified breast and nipple: Secondary | ICD-10-CM

## 2012-10-03 DIAGNOSIS — Z85828 Personal history of other malignant neoplasm of skin: Secondary | ICD-10-CM

## 2012-10-03 DIAGNOSIS — I498 Other specified cardiac arrhythmias: Secondary | ICD-10-CM | POA: Diagnosis present

## 2012-10-03 DIAGNOSIS — D62 Acute posthemorrhagic anemia: Secondary | ICD-10-CM | POA: Diagnosis present

## 2012-10-03 DIAGNOSIS — M7989 Other specified soft tissue disorders: Secondary | ICD-10-CM | POA: Diagnosis present

## 2012-10-03 DIAGNOSIS — Z882 Allergy status to sulfonamides status: Secondary | ICD-10-CM

## 2012-10-03 DIAGNOSIS — K644 Residual hemorrhoidal skin tags: Secondary | ICD-10-CM | POA: Diagnosis present

## 2012-10-03 DIAGNOSIS — R197 Diarrhea, unspecified: Secondary | ICD-10-CM

## 2012-10-03 DIAGNOSIS — I10 Essential (primary) hypertension: Secondary | ICD-10-CM | POA: Diagnosis present

## 2012-10-03 DIAGNOSIS — K5731 Diverticulosis of large intestine without perforation or abscess with bleeding: Principal | ICD-10-CM | POA: Diagnosis present

## 2012-10-03 HISTORY — DX: Unspecified malignant neoplasm of skin, unspecified: C44.90

## 2012-10-03 HISTORY — DX: Hypothyroidism, unspecified: E03.9

## 2012-10-03 HISTORY — DX: Essential (primary) hypertension: I10

## 2012-10-03 LAB — PROTIME-INR
INR: 0.94 (ref 0.00–1.49)
Prothrombin Time: 12.5 seconds (ref 11.6–15.2)

## 2012-10-03 LAB — COMPREHENSIVE METABOLIC PANEL
ALT: 13 U/L (ref 0–35)
AST: 16 U/L (ref 0–37)
Albumin: 3.6 g/dL (ref 3.5–5.2)
Alkaline Phosphatase: 76 U/L (ref 39–117)
BUN: 30 mg/dL — ABNORMAL HIGH (ref 6–23)
CO2: 27 mEq/L (ref 19–32)
Calcium: 9.9 mg/dL (ref 8.4–10.5)
Chloride: 103 mEq/L (ref 96–112)
Creatinine, Ser: 1.18 mg/dL — ABNORMAL HIGH (ref 0.50–1.10)
GFR calc Af Amer: 48 mL/min — ABNORMAL LOW (ref >90)
GFR calc non Af Amer: 41 mL/min — ABNORMAL LOW (ref >90)
Glucose, Bld: 141 mg/dL — ABNORMAL HIGH (ref 70–99)
Potassium: 3.6 mEq/L (ref 3.5–5.1)
Sodium: 140 mEq/L (ref 135–145)
Total Bilirubin: 0.4 mg/dL (ref 0.3–1.2)
Total Protein: 6.4 g/dL (ref 6.0–8.3)

## 2012-10-03 LAB — CBC
HCT: 38.3 % (ref 36.0–46.0)
Hemoglobin: 12.6 g/dL (ref 12.0–15.0)
MCH: 30.5 pg (ref 26.0–34.0)
MCHC: 32.9 g/dL (ref 30.0–36.0)
MCV: 92.7 fL (ref 78.0–100.0)
Platelets: 154 10*3/uL (ref 150–400)
RBC: 4.13 MIL/uL (ref 3.87–5.11)
RDW: 13.8 % (ref 11.5–15.5)
WBC: 5.4 10*3/uL (ref 4.0–10.5)

## 2012-10-03 LAB — POCT I-STAT, CHEM 8
BUN: 30 mg/dL — ABNORMAL HIGH (ref 6–23)
Calcium, Ion: 1.25 mmol/L (ref 1.13–1.30)
Chloride: 106 mEq/L (ref 96–112)
Creatinine, Ser: 1.2 mg/dL — ABNORMAL HIGH (ref 0.50–1.10)
Glucose, Bld: 140 mg/dL — ABNORMAL HIGH (ref 70–99)
HCT: 38 % (ref 36.0–46.0)
Hemoglobin: 12.9 g/dL (ref 12.0–15.0)
Potassium: 3.7 mEq/L (ref 3.5–5.1)
Sodium: 142 mEq/L (ref 135–145)
TCO2: 28 mmol/L (ref 0–100)

## 2012-10-03 LAB — OCCULT BLOOD, POC DEVICE: Fecal Occult Bld: POSITIVE — AB

## 2012-10-03 MED ORDER — SODIUM CHLORIDE 0.9 % IV SOLN
INTRAVENOUS | Status: DC
  Administered 2012-10-04 – 2012-10-08 (×6): via INTRAVENOUS

## 2012-10-03 MED ORDER — ONDANSETRON HCL 4 MG PO TABS: 4.0000 mg | ORAL_TABLET | Freq: Four times a day (QID) | ORAL | Status: DC | PRN

## 2012-10-03 MED ORDER — ACETAMINOPHEN 650 MG RE SUPP: 650.0000 mg | Freq: Four times a day (QID) | RECTAL | Status: DC | PRN

## 2012-10-03 MED ORDER — ALUM & MAG HYDROXIDE-SIMETH 200-200-20 MG/5ML PO SUSP: 30.0000 mL | Freq: Four times a day (QID) | ORAL | Status: DC | PRN

## 2012-10-03 MED ORDER — ACETAMINOPHEN 325 MG PO TABS
650.0000 mg | ORAL_TABLET | Freq: Four times a day (QID) | ORAL | Status: DC | PRN
  Administered 2012-10-04 – 2012-10-08 (×6): 650 mg via ORAL
  Filled 2012-10-03 (×5): qty 2
  Filled 2012-10-03: qty 1

## 2012-10-03 MED ORDER — IOHEXOL 300 MG/ML  SOLN
50.0000 mL | Freq: Once | INTRAMUSCULAR | Status: AC | PRN
  Administered 2012-10-03: 50 mL via ORAL

## 2012-10-03 MED ORDER — OXYCODONE HCL 5 MG PO TABS
5.0000 mg | ORAL_TABLET | ORAL | Status: DC | PRN
  Administered 2012-10-04 – 2012-10-08 (×3): 5 mg via ORAL
  Filled 2012-10-03 (×3): qty 1

## 2012-10-03 MED ORDER — IOHEXOL 300 MG/ML  SOLN
100.0000 mL | Freq: Once | INTRAMUSCULAR | Status: AC | PRN
  Administered 2012-10-03: 100 mL via INTRAVENOUS

## 2012-10-03 MED ORDER — SODIUM CHLORIDE 0.9 % IV BOLUS (SEPSIS)
1000.0000 mL | Freq: Once | INTRAVENOUS | Status: AC
  Administered 2012-10-03: 1000 mL via INTRAVENOUS

## 2012-10-03 MED ORDER — ZOLPIDEM TARTRATE 5 MG PO TABS: 5.0000 mg | ORAL_TABLET | Freq: Every evening | ORAL | Status: DC | PRN

## 2012-10-03 MED ORDER — ONDANSETRON HCL 4 MG/2ML IJ SOLN
4.0000 mg | Freq: Four times a day (QID) | INTRAMUSCULAR | Status: DC | PRN
  Administered 2012-10-05: 4 mg via INTRAVENOUS
  Filled 2012-10-03: qty 2

## 2012-10-03 MED ORDER — HYDROMORPHONE HCL PF 1 MG/ML IJ SOLN: 0.5000 mg | INTRAMUSCULAR | Status: DC | PRN

## 2012-10-03 MED ORDER — PANTOPRAZOLE SODIUM 40 MG IV SOLR
40.0000 mg | Freq: Two times a day (BID) | INTRAVENOUS | Status: DC
  Administered 2012-10-04 – 2012-10-08 (×10): 40 mg via INTRAVENOUS
  Filled 2012-10-03 (×11): qty 40

## 2012-10-03 NOTE — ED Notes (Signed)
Acute onset of rectal bleeding ~1.5 hr. Ago. No n/v, no abd. Some pressure.

## 2012-10-03 NOTE — ED Notes (Signed)
Waiting for CT

## 2012-10-03 NOTE — ED Notes (Signed)
Vital signs stable. 

## 2012-10-03 NOTE — ED Provider Notes (Signed)
History     CSN: [id number]  Arrival date & time 10/03/12  2003   First MD Initiated Contact with Patient 10/03/12 2021      No chief complaint on file.   (Consider location/radiation/quality/duration/timing/severity/associated sxs/prior treatment) HPI Comments: 79 y.o. female who presents to the ER w/ the cc of rectal bleeding. Started prior to arrival, she had 2 episodes of bright red blood that was mixed in w/ her stool.  Patient is a 79 y.o. female presenting with general illness. The history is provided by the patient.  Illness Severity:  Mild Onset quality:  Gradual Timing:  Intermittent Progression:  Resolved Chronicity:  New Associated symptoms: abdominal pain   Associated symptoms: no chest pain, no congestion, no cough, no diarrhea, no fatigue, no fever, no headaches, no rash, no vomiting and no wheezing     Past Medical History  Diagnosis Date  . Hypertension   . Skin cancer   . Diabetes mellitus without complication     Past Surgical History  Procedure Laterality Date  . Mastectomy    . Joint replacement    . Hip surgery    . Replacement total knee    . Abdominal hysterectomy    . Cholecystectomy    . Cataract extraction      No family history on file.  History  Substance Use Topics  . Smoking status: Never Smoker   . Smokeless tobacco: Never Used  . Alcohol Use: Yes    OB History   Grav Para Term Preterm Abortions TAB SAB Ect Mult Living                  Review of Systems  Constitutional: Negative for fever, chills and fatigue.  HENT: Negative for congestion, facial swelling, drooling, neck pain and dental problem.   Eyes: Negative for pain, discharge and itching.  Respiratory: Negative for cough, choking, wheezing and stridor.   Cardiovascular: Negative for chest pain.  Gastrointestinal: Positive for abdominal pain and blood in stool. Negative for vomiting and diarrhea.  Endocrine: Negative for cold intolerance and heat intolerance.   Genitourinary: Negative for vaginal discharge, difficulty urinating and vaginal pain.  Skin: Negative for pallor and rash.  Neurological: Negative for dizziness, light-headedness and headaches.  Psychiatric/Behavioral: Negative for behavioral problems and agitation.    Allergies  Codeine; Morphine and related; and Sulfa antibiotics  Home Medications  No current outpatient prescriptions on file.  SpO2 97%  Physical Exam  Constitutional: She is oriented to person, place, and time. She appears well-developed. No distress.  HENT:  Head: Normocephalic and atraumatic.  Eyes: Pupils are equal, round, and reactive to light. Right eye exhibits no discharge. Left eye exhibits no discharge.  Neck: Neck supple. No tracheal deviation present.  Cardiovascular: Normal rate.  Exam reveals no gallop and no friction rub.   Pulmonary/Chest: No stridor. No respiratory distress. She has no wheezes.  Abdominal: Soft. She exhibits no distension. There is tenderness (mild LLQ ttp). There is no rebound.  Genitourinary:  Pt with bright red blood in rectal vault, very small amount. Hemoccult is positive. She does not have melena.   Musculoskeletal: She exhibits no edema and no tenderness.  Neurological: She is alert and oriented to person, place, and time.  Skin: Skin is warm. She is not diaphoretic.    ED Course  Procedures (including critical care time)  Labs Reviewed  COMPREHENSIVE METABOLIC PANEL - Abnormal; Notable for the following:    Glucose, Bld 141 (*)  BUN 30 (*)    Creatinine, Ser 1.18 (*)    GFR calc non Af Amer 41 (*)    GFR calc Af Amer 48 (*)    All other components within normal limits  POCT I-STAT, CHEM 8 - Abnormal; Notable for the following:    BUN 30 (*)    Creatinine, Ser 1.20 (*)    Glucose, Bld 140 (*)    All other components within normal limits  OCCULT BLOOD, POC DEVICE - Abnormal; Notable for the following:    Fecal Occult Bld POSITIVE (*)    All other components  within normal limits  CBC  PROTIME-INR  HEMOGLOBIN AND HEMATOCRIT, BLOOD  TYPE AND SCREEN   Ct Abdomen Pelvis W Contrast  10/03/2012   *RADIOLOGY REPORT*  Clinical Data: Left lower quadrant pain.  Rectal bleeding. Clinical suspicion for diverticulitis.  CT ABDOMEN AND PELVIS WITH CONTRAST  Technique:  Multidetector CT imaging of the abdomen and pelvis was performed following the standard protocol during bolus administration of intravenous contrast.  Contrast: OMNIPAQUE IOHEXOL 300 MG/ML  SOLN  Comparison: None.  Findings: A moderate size hiatal hernia is again seen.  Prior cholecystectomy again noted.  Tiny sub-centimeter cyst again seen in the peripheral anterior segment right hepatic lobe, however no liver masses are identified.  No evidence of biliary ductal dilatation.  A lateral left lower quadrant abdominal wall hernia is again seen just above the iliac crest which contains several small bowel loops.  No evidence of bowel obstruction.  Sigmoid diverticulosis is noted, which is partially obscured by artifact from left hip prosthesis.  Allowing for this limitation, there is no evidence of acute diverticulitis or other inflammatory process or abnormal fluid collections.  No evidence of bowel obstruction.  The pancreas, spleen, and adrenal glands are normal in appearance. Small renal cysts are noted bilaterally however there is no evidence of renal mass or hydronephrosis.  No other soft tissue masses or lymphadenopathy identified.  Significant beam hardening artifact is seen due to left hip prosthesis. Previous hysterectomy noted.  Chronic protrusio acetabulae noted.  No evidence of fracture.  Advanced degenerative disc disease is seen throughout the lower thoracic and lumbar spine.  IMPRESSION:  1.  Exam is suboptimal due to artifact from left hip prosthesis. Sigmoid diverticulosis.  No definite radiographic evidence of diverticulitis. 2.  Stable left lower quadrant lateral abdominal wall hernia  containing small bowel.  No evidence of bowel obstruction or other acute findings. 3.  Stable moderate hiatal hernia. 3.   Original Report Authenticated By: Myles Rosenthal, M.D.     MDM  Hemodynamically pt is stable. She had 2 episodes of bright red blood in her stool. CT scan shows diverticulosis, but not diverticulitis. She is admitted to the hospitalist team for further eval of bleeding.    1. Rectal bleeding           Bernadene Person, MD 10/04/12 [id number]

## 2012-10-03 NOTE — ED Notes (Signed)
Patient is resting comfortably. 

## 2012-10-04 ENCOUNTER — Encounter (HOSPITAL_COMMUNITY): Payer: Self-pay | Admitting: *Deleted

## 2012-10-04 DIAGNOSIS — K625 Hemorrhage of anus and rectum: Secondary | ICD-10-CM

## 2012-10-04 DIAGNOSIS — I10 Essential (primary) hypertension: Secondary | ICD-10-CM

## 2012-10-04 LAB — BASIC METABOLIC PANEL
BUN: 21 mg/dL (ref 6–23)
CO2: 25 mEq/L (ref 19–32)
Calcium: 9.2 mg/dL (ref 8.4–10.5)
Chloride: 106 mEq/L (ref 96–112)
Creatinine, Ser: 1.08 mg/dL (ref 0.50–1.10)
GFR calc Af Amer: 53 mL/min — ABNORMAL LOW (ref >90)
GFR calc non Af Amer: 46 mL/min — ABNORMAL LOW (ref >90)
Glucose, Bld: 116 mg/dL — ABNORMAL HIGH (ref 70–99)
Potassium: 3.9 mEq/L (ref 3.5–5.1)
Sodium: 140 mEq/L (ref 135–145)

## 2012-10-04 LAB — CBC
HCT: 36.2 % (ref 36.0–46.0)
Hemoglobin: 11.9 g/dL — ABNORMAL LOW (ref 12.0–15.0)
MCH: 30.6 pg (ref 26.0–34.0)
MCHC: 32.9 g/dL (ref 30.0–36.0)
MCV: 93.1 fL (ref 78.0–100.0)
Platelets: 149 10*3/uL — ABNORMAL LOW (ref 150–400)
RBC: 3.89 MIL/uL (ref 3.87–5.11)
RDW: 13.9 % (ref 11.5–15.5)
WBC: 5.9 10*3/uL (ref 4.0–10.5)

## 2012-10-04 LAB — HEMOGLOBIN AND HEMATOCRIT, BLOOD
HCT: 32 % — ABNORMAL LOW (ref 36.0–46.0)
HCT: 33 % — ABNORMAL LOW (ref 36.0–46.0)
HCT: 34.7 % — ABNORMAL LOW (ref 36.0–46.0)
HCT: 37.6 % (ref 36.0–46.0)
Hemoglobin: 10.9 g/dL — ABNORMAL LOW (ref 12.0–15.0)
Hemoglobin: 10.9 g/dL — ABNORMAL LOW (ref 12.0–15.0)
Hemoglobin: 11.7 g/dL — ABNORMAL LOW (ref 12.0–15.0)
Hemoglobin: 12.6 g/dL (ref 12.0–15.0)

## 2012-10-04 LAB — TYPE AND SCREEN
ABO/RH(D): A POS
Antibody Screen: NEGATIVE

## 2012-10-04 MED ORDER — HYDRALAZINE HCL 20 MG/ML IJ SOLN
5.0000 mg | Freq: Four times a day (QID) | INTRAMUSCULAR | Status: DC | PRN
  Administered 2012-10-04 – 2012-10-05 (×2): 5 mg via INTRAVENOUS
  Filled 2012-10-04 (×3): qty 1

## 2012-10-04 MED ORDER — LEVOTHYROXINE SODIUM 75 MCG PO TABS
75.0000 ug | ORAL_TABLET | Freq: Every day | ORAL | Status: DC
  Administered 2012-10-04 – 2012-10-08 (×5): 75 ug via ORAL
  Filled 2012-10-04 (×7): qty 1

## 2012-10-04 NOTE — Consult Note (Signed)
[facility] Consult Note  Referring Provider: No ref. provider found Primary Care Physician:  Leanor Rubenstein, MD Primary Gastroenterologist:  Dr.  Antony Contras Complaint: Rectal bleeding HPI: Tiffany [name] is an 79 y.o. white female  who presents with sudden onset of painless bright red blood starting yesterday with approximately 8 movements that were fairly large volume. She denies any pain weakness or dizziness. She had an abdominal CT scan which showed sigmoid diverticulosis with no other abnormalities. The patient has never had a colonoscopy. She has no family history of GI malignancy. She is currently comfortable. She had one small bright red bloody stool this morning.  Past Medical History  Diagnosis Date  . Hypertension   . Skin cancer     Past Surgical History  Procedure Laterality Date  . Mastectomy    . Joint replacement    . Hip surgery    . Replacement total knee    . Abdominal hysterectomy    . Cholecystectomy    . Cataract extraction      Medications Prior to Admission  Medication Sig Dispense Refill  . Calcium Carbonate-Vit D-Min (CALCIUM 1200 PO) Take 1,200 mg by mouth daily.      . diphenoxylate-atropine (LOMOTIL) 2.5-0.025 MG per tablet Take 1 tablet by mouth 4 (four) times daily as needed for diarrhea or loose stools.      . irbesartan (AVAPRO) 300 MG tablet Take 300 mg by mouth every morning.      Marland Kitchen levothyroxine (SYNTHROID, LEVOTHROID) 75 MCG tablet Take 75 mcg by mouth daily before breakfast.      . metoprolol (LOPRESSOR) 50 MG tablet Take 50 mg by mouth 2 (two) times daily.      Marland Kitchen oxyCODONE-acetaminophen (PERCOCET/ROXICET) 5-325 MG per tablet Take 1 tablet by mouth every 4 (four) hours as needed for pain.        Allergies:  Allergies  Allergen Reactions  . Codeine Nausea Only  . Morphine And Related Anxiety and Other (See Comments)    Feel very unwell and out there  . Sulfa Antibiotics Hives and Itching    Family History  Problem Relation  Age of Onset  . Glaucoma Mother   . Diabetes Sister   . Diabetes Brother   . Coronary artery disease Father     Social History:  reports that she has never smoked. She has never used smokeless tobacco. She reports that she does not drink alcohol or use illicit drugs.  Review of Systems: negative except as above   Blood pressure 161/72, pulse 81, temperature 97.9 F (36.6 C), temperature source Oral, resp. rate 20, height 5' 3.6" (1.615 m), weight 79.198 kg (174 lb 9.6 oz), SpO2 97.00%. Head: Normocephalic, without obvious abnormality, atraumatic Neck: no adenopathy, no carotid bruit, no JVD, supple, symmetrical, trachea midline and thyroid not enlarged, symmetric, no tenderness/mass/nodules Resp: clear to auscultation bilaterally Cardio: regular rate and rhythm, S1, S2 normal, no murmur, click, rub or gallop GI: Abdomen soft nondistended with normoactive bowel sounds. nightly mass or guarding. Extremities: extremities normal, atraumatic, no cyanosis or edema  Results for orders placed during the hospital encounter of 10/03/12 (from the past 48 hour(s))  CBC     Status: None   Collection Time    10/03/12  8:24 PM      Result Value Range   WBC 5.4  4.0 - 10.5 K/uL   RBC 4.13  3.87 - 5.11 MIL/uL   Hemoglobin 12.6  12.0 - 15.0 g/dL   HCT 16.1  09.6 -  46.0 %   MCV 92.7  78.0 - 100.0 fL   MCH 30.5  26.0 - 34.0 pg   MCHC 32.9  30.0 - 36.0 g/dL   RDW 78.2  95.6 - 21.3 %   Platelets 154  150 - 400 K/uL  POCT I-STAT, CHEM 8     Status: Abnormal   Collection Time    10/03/12  8:44 PM      Result Value Range   Sodium 142  135 - 145 mEq/L   Potassium 3.7  3.5 - 5.1 mEq/L   Chloride 106  96 - 112 mEq/L   BUN 30 (*) 6 - 23 mg/dL   Creatinine, Ser 0.86 (*) 0.50 - 1.10 mg/dL   Glucose, Bld 578 (*) 70 - 99 mg/dL   Calcium, Ion 4.69  6.29 - 1.30 mmol/L   TCO2 28  0 - 100 mmol/L   Hemoglobin 12.9  12.0 - 15.0 g/dL   HCT 52.8  41.3 - 24.4 %  COMPREHENSIVE METABOLIC PANEL     Status:  Abnormal   Collection Time    10/03/12  8:46 PM      Result Value Range   Sodium 140  135 - 145 mEq/L   Potassium 3.6  3.5 - 5.1 mEq/L   Chloride 103  96 - 112 mEq/L   CO2 27  19 - 32 mEq/L   Glucose, Bld 141 (*) 70 - 99 mg/dL   BUN 30 (*) 6 - 23 mg/dL   Creatinine, Ser 0.10 (*) 0.50 - 1.10 mg/dL   Calcium 9.9  8.4 - 27.2 mg/dL   Total Protein 6.4  6.0 - 8.3 g/dL   Albumin 3.6  3.5 - 5.2 g/dL   AST 16  0 - 37 U/L   ALT 13  0 - 35 U/L   Alkaline Phosphatase 76  39 - 117 U/L   Total Bilirubin 0.4  0.3 - 1.2 mg/dL   GFR calc non Af Amer 41 (*) >90 mL/min   GFR calc Af Amer 48 (*) >90 mL/min   Comment:            The eGFR has been calculated     using the CKD EPI equation.     This calculation has not been     validated in all clinical     situations.     eGFR's persistently     <90 mL/min signify     possible Chronic Kidney Disease.  PROTIME-INR     Status: None   Collection Time    10/03/12  8:46 PM      Result Value Range   Prothrombin Time 12.5  11.6 - 15.2 seconds   INR 0.94  0.00 - 1.49  OCCULT BLOOD, POC DEVICE     Status: Abnormal   Collection Time    10/03/12  8:49 PM      Result Value Range   Fecal Occult Bld POSITIVE (*) NEGATIVE  TYPE AND SCREEN     Status: None   Collection Time    10/04/12 12:15 AM      Result Value Range   ABO/RH(D) A POS     Antibody Screen NEG     Sample Expiration 10/07/2012    HEMOGLOBIN AND HEMATOCRIT, BLOOD     Status: None   Collection Time    10/04/12 12:19 AM      Result Value Range   Hemoglobin 12.6  12.0 - 15.0 g/dL   HCT 53.6  64.4 -  46.0 %  BASIC METABOLIC PANEL     Status: Abnormal   Collection Time    10/04/12  5:36 AM      Result Value Range   Sodium 140  135 - 145 mEq/L   Potassium 3.9  3.5 - 5.1 mEq/L   Chloride 106  96 - 112 mEq/L   CO2 25  19 - 32 mEq/L   Glucose, Bld 116 (*) 70 - 99 mg/dL   BUN 21  6 - 23 mg/dL   Creatinine, Ser 4.09  0.50 - 1.10 mg/dL   Calcium 9.2  8.4 - 81.1 mg/dL   GFR calc non Af  Amer 46 (*) >90 mL/min   GFR calc Af Amer 53 (*) >90 mL/min   Comment:            The eGFR has been calculated     using the CKD EPI equation.     This calculation has not been     validated in all clinical     situations.     eGFR's persistently     <90 mL/min signify     possible Chronic Kidney Disease.  CBC     Status: Abnormal   Collection Time    10/04/12  5:36 AM      Result Value Range   WBC 5.9  4.0 - 10.5 K/uL   RBC 3.89  3.87 - 5.11 MIL/uL   Hemoglobin 11.9 (*) 12.0 - 15.0 g/dL   HCT 91.4  78.2 - 95.6 %   MCV 93.1  78.0 - 100.0 fL   MCH 30.6  26.0 - 34.0 pg   MCHC 32.9  30.0 - 36.0 g/dL   RDW 21.3  08.6 - 57.8 %   Platelets 149 (*) 150 - 400 K/uL  HEMOGLOBIN AND HEMATOCRIT, BLOOD     Status: Abnormal   Collection Time    10/04/12  8:06 AM      Result Value Range   Hemoglobin 11.7 (*) 12.0 - 15.0 g/dL   HCT 46.9 (*) 62.9 - 52.8 %   Ct Abdomen Pelvis W Contrast  10/03/2012   *RADIOLOGY REPORT*  Clinical Data: Left lower quadrant pain.  Rectal bleeding. Clinical suspicion for diverticulitis.  CT ABDOMEN AND PELVIS WITH CONTRAST  Technique:  Multidetector CT imaging of the abdomen and pelvis was performed following the standard protocol during bolus administration of intravenous contrast.  Contrast: OMNIPAQUE IOHEXOL 300 MG/ML  SOLN  Comparison: None.  Findings: A moderate size hiatal hernia is again seen.  Prior cholecystectomy again noted.  Tiny sub-centimeter cyst again seen in the peripheral anterior segment right hepatic lobe, however no liver masses are identified.  No evidence of biliary ductal dilatation.  A lateral left lower quadrant abdominal wall hernia is again seen just above the iliac crest which contains several small bowel loops.  No evidence of bowel obstruction.  Sigmoid diverticulosis is noted, which is partially obscured by artifact from left hip prosthesis.  Allowing for this limitation, there is no evidence of acute diverticulitis or other  inflammatory process or abnormal fluid collections.  No evidence of bowel obstruction.  The pancreas, spleen, and adrenal glands are normal in appearance. Small renal cysts are noted bilaterally however there is no evidence of renal mass or hydronephrosis.  No other soft tissue masses or lymphadenopathy identified.  Significant beam hardening artifact is seen due to left hip prosthesis. Previous hysterectomy noted.  Chronic protrusio acetabulae noted.  No evidence of fracture.  Advanced degenerative  disc disease is seen throughout the lower thoracic and lumbar spine.  IMPRESSION:  1.  Exam is suboptimal due to artifact from left hip prosthesis. Sigmoid diverticulosis.  No definite radiographic evidence of diverticulitis. 2.  Stable left lower quadrant lateral abdominal wall hernia containing small bowel.  No evidence of bowel obstruction or other acute findings. 3.  Stable moderate hiatal hernia. 3.   Original Report Authenticated By: Myles Rosenthal, M.D.    Assessment: Painless lower GI bleeding, in all likelihood diverticular. Plan:  Supportive care and transfuse as necessary. Given her overall good health I think she would be a good candidate for colonoscopy which we will tentatively plan next 2 or 3 days. Izzy Doubek C 10/04/2012, 9:35 AM

## 2012-10-04 NOTE — H&P (Addendum)
[facility] History and Physical  Tiffany [name] ZOX:[id number] DOB: 1926-08-06 DOA: 10/03/2012  Referring physician: EDP PCP: Tiffany Rubenstein, MD  Specialists:  Chief Complaint: Rectal Bleeding  HPI: Tiffany [name] is a 79 y.o. female who presents to the ED with complains of BRBPR X 3 episodes at home occurred since 7 pm.   She denies having any ABD pain or nausea or vomiting or fevers or chills.   She denies having any similar previous episodes.   She was evaluated in the ED and was found to have an initial Hb = 12.9, and a ct SCan of the ABD /pelvis was performed and revealed diverticulosis.    She denied having any weakness SOB, or chest pain.   She was referred for medical admission.      Review of Systems: The patient denies anorexia, fever, chills, weight loss, vision loss, decreased hearing, hoarseness, chest pain, syncope, dyspnea on exertion, peripheral edema, balance deficits, hemoptysis, abdominal pain,nausea, vomiting, diarrhea, melena, hematemesis, severe indigestion/heartburn, hematuria, incontinence, dysuria, muscle weakness, suspicious skin lesions, transient blindness, difficulty walking, depression, unusual weight change, abnormal bleeding, enlarged lymph nodes, angioedema, and breast masses.    Past Medical History  Diagnosis Date  . Hypertension   . Skin cancer    Past Surgical History  Procedure Laterality Date  . Mastectomy    . Joint replacement    . Hip surgery    . Replacement total knee    . Abdominal hysterectomy    . Cholecystectomy    . Cataract extraction       Medications:  HOME MEDS: Prior to Admission medications   Medication Sig Start Date End Date Taking? Authorizing Provider  Calcium Carbonate-Vit D-Min (CALCIUM 1200 PO) Take 1,200 mg by mouth daily.   Yes Historical Provider, MD  diphenoxylate-atropine (LOMOTIL) 2.5-0.025 MG per tablet Take 1 tablet by mouth 4 (four) times daily as needed for diarrhea or loose stools.   Yes Historical  Provider, MD  irbesartan (AVAPRO) 300 MG tablet Take 300 mg by mouth every morning.   Yes Historical Provider, MD  levothyroxine (SYNTHROID, LEVOTHROID) 75 MCG tablet Take 75 mcg by mouth daily before breakfast.   Yes Historical Provider, MD  metoprolol (LOPRESSOR) 50 MG tablet Take 50 mg by mouth 2 (two) times daily.   Yes Historical Provider, MD  oxyCODONE-acetaminophen (PERCOCET/ROXICET) 5-325 MG per tablet Take 1 tablet by mouth every 4 (four) hours as needed for pain.   Yes Historical Provider, MD    Allergies:  Allergies  Allergen Reactions  . Codeine Nausea Only  . Morphine And Related Anxiety and Other (See Comments)    Feel very unwell and out there  . Sulfa Antibiotics Hives and Itching    Social History:   reports that she has never smoked. She has never used smokeless tobacco. She reports that she does not drink alcohol or use illicit drugs.  Family History: Family History  Problem Relation Age of Onset  . Glaucoma Mother   . Diabetes Sister   . Diabetes Brother   . Coronary artery disease Father     Review of Systems:  The patient denies anorexia, fever, weight loss,, vision loss, decreased hearing, hoarseness, chest pain, syncope, dyspnea on exertion, peripheral edema, balance deficits, hemoptysis, abdominal pain, melena, hematochezia, severe indigestion/heartburn, hematuria, incontinence, genital sores, muscle weakness, suspicious skin lesions, transient blindness, difficulty walking, depression, unusual weight change, abnormal bleeding, enlarged lymph nodes, angioedema, and breast masses.   Physical Exam:  GEN:  Pleasant 79 year old  Obese Elderly Caucasian Female examined  and in no acute distress; cooperative with exam Filed Vitals:   10/04/12 0015 10/04/12 0030 10/04/12 0128 10/04/12 0429  BP: 163/69 159/64 184/82 161/72  Pulse: 85 83 81   Temp:   98 F (36.7 C) 97.9 F (36.6 C)  TempSrc:   Oral Oral  Resp: 18 19 18 20   Height:   5' 3.6" (1.615 m)    Weight:   79.198 kg (174 lb 9.6 oz)   SpO2: 97% 96% 97% 97%   Blood pressure 161/72, pulse 81, temperature 97.9 F (36.6 C), temperature source Oral, resp. rate 20, height 5' 3.6" (1.615 m), weight 79.198 kg (174 lb 9.6 oz), SpO2 97.00%. PSYCH: She is alert and oriented x4; does not appear anxious does not appear depressed; affect is normal HEENT: Normocephalic and Atraumatic, Mucous membranes pink; PERRLA; EOM intact; Fundi:  Benign;  No scleral icterus, Nares: Patent, Oropharynx: Clear, Edentulous,  Neck:  FROM, no cervical lymphadenopathy nor thyromegaly or carotid bruit; no JVD; Breasts:: Not examined CHEST WALL: No tenderness CHEST: Normal respiration, clear to auscultation bilaterally HEART: Regular rate and rhythm; no murmurs rubs or gallops BACK: No kyphosis or scoliosis; no CVA tenderness ABDOMEN: Positive Bowel Sounds, Obese, soft non-tender; no masses, no organomegaly, no pannus; no intertriginous candida. Rectal Exam: Not done EXTREMITIES: No cyanosis, clubbing or edema; no ulcerations. Genitalia: not examined PULSES: 2+ and symmetric SKIN: Normal hydration no rash or ulceration CNS: Cranial nerves 2-12 grossly intact no focal neurologic deficit   Labs & Imaging Results for orders placed during the hospital encounter of 10/03/12 (from the past 48 hour(s))  CBC     Status: None   Collection Time    10/03/12  8:24 PM      Result Value Range   WBC 5.4  4.0 - 10.5 K/uL   RBC 4.13  3.87 - 5.11 MIL/uL   Hemoglobin 12.6  12.0 - 15.0 g/dL   HCT 54.0  98.1 - 19.1 %   MCV 92.7  78.0 - 100.0 fL   MCH 30.5  26.0 - 34.0 pg   MCHC 32.9  30.0 - 36.0 g/dL   RDW 47.8  29.5 - 62.1 %   Platelets 154  150 - 400 K/uL  POCT I-STAT, CHEM 8     Status: Abnormal   Collection Time    10/03/12  8:44 PM      Result Value Range   Sodium 142  135 - 145 mEq/L   Potassium 3.7  3.5 - 5.1 mEq/L   Chloride 106  96 - 112 mEq/L   BUN 30 (*) 6 - 23 mg/dL   Creatinine, Ser 3.08 (*) 0.50 - 1.10  mg/dL   Glucose, Bld 657 (*) 70 - 99 mg/dL   Calcium, Ion 8.46  9.62 - 1.30 mmol/L   TCO2 28  0 - 100 mmol/L   Hemoglobin 12.9  12.0 - 15.0 g/dL   HCT 95.2  84.1 - 32.4 %  COMPREHENSIVE METABOLIC PANEL     Status: Abnormal   Collection Time    10/03/12  8:46 PM      Result Value Range   Sodium 140  135 - 145 mEq/L   Potassium 3.6  3.5 - 5.1 mEq/L   Chloride 103  96 - 112 mEq/L   CO2 27  19 - 32 mEq/L   Glucose, Bld 141 (*) 70 - 99 mg/dL   BUN 30 (*) 6 - 23 mg/dL   Creatinine, Ser 4.01 (*)  0.50 - 1.10 mg/dL   Calcium 9.9  8.4 - 16.1 mg/dL   Total Protein 6.4  6.0 - 8.3 g/dL   Albumin 3.6  3.5 - 5.2 g/dL   AST 16  0 - 37 U/L   ALT 13  0 - 35 U/L   Alkaline Phosphatase 76  39 - 117 U/L   Total Bilirubin 0.4  0.3 - 1.2 mg/dL   GFR calc non Af Amer 41 (*) >90 mL/min   GFR calc Af Amer 48 (*) >90 mL/min   Comment:            The eGFR has been calculated     using the CKD EPI equation.     This calculation has not been     validated in all clinical     situations.     eGFR's persistently     <90 mL/min signify     possible Chronic Kidney Disease.  PROTIME-INR     Status: None   Collection Time    10/03/12  8:46 PM      Result Value Range   Prothrombin Time 12.5  11.6 - 15.2 seconds   INR 0.94  0.00 - 1.49  OCCULT BLOOD, POC DEVICE     Status: Abnormal   Collection Time    10/03/12  8:49 PM      Result Value Range   Fecal Occult Bld POSITIVE (*) NEGATIVE  TYPE AND SCREEN     Status: None   Collection Time    10/04/12 12:15 AM      Result Value Range   ABO/RH(D) A POS     Antibody Screen NEG     Sample Expiration 10/07/2012    HEMOGLOBIN AND HEMATOCRIT, BLOOD     Status: None   Collection Time    10/04/12 12:19 AM      Result Value Range   Hemoglobin 12.6  12.0 - 15.0 g/dL   HCT 09.6  04.5 - 40.9 %    Radiological Exams on Admission: Ct Abdomen Pelvis W Contrast  10/03/2012   *RADIOLOGY REPORT*  Clinical Data: Left lower quadrant pain.  Rectal bleeding. Clinical  suspicion for diverticulitis.  CT ABDOMEN AND PELVIS WITH CONTRAST  Technique:  Multidetector CT imaging of the abdomen and pelvis was performed following the standard protocol during bolus administration of intravenous contrast.  Contrast: OMNIPAQUE IOHEXOL 300 MG/ML  SOLN  Comparison: None.  Findings: A moderate size hiatal hernia is again seen.  Prior cholecystectomy again noted.  Tiny sub-centimeter cyst again seen in the peripheral anterior segment right hepatic lobe, however no liver masses are identified.  No evidence of biliary ductal dilatation.  A lateral left lower quadrant abdominal wall hernia is again seen just above the iliac crest which contains several small bowel loops.  No evidence of bowel obstruction.  Sigmoid diverticulosis is noted, which is partially obscured by artifact from left hip prosthesis.  Allowing for this limitation, there is no evidence of acute diverticulitis or other inflammatory process or abnormal fluid collections.  No evidence of bowel obstruction.  The pancreas, spleen, and adrenal glands are normal in appearance. Small renal cysts are noted bilaterally however there is no evidence of renal mass or hydronephrosis.  No other soft tissue masses or lymphadenopathy identified.  Significant beam hardening artifact is seen due to left hip prosthesis. Previous hysterectomy noted.  Chronic protrusio acetabulae noted.  No evidence of fracture.  Advanced degenerative disc disease is seen throughout the lower thoracic  and lumbar spine.  IMPRESSION:  1.  Exam is suboptimal due to artifact from left hip prosthesis. Sigmoid diverticulosis.  No definite radiographic evidence of diverticulitis. 2.  Stable left lower quadrant lateral abdominal wall hernia containing small bowel.  No evidence of bowel obstruction or other acute findings. 3.  Stable moderate hiatal hernia. 3.   Original Report Authenticated By: Myles Rosenthal, M.D.    EKG: Independently reviewed.    Assessment/Plan Principal Problem:   Rectal bleeding Active Problems:   HTN (hypertension)   1.  Rectal Bleeding-  Probably diverticular or Lower GI,  Monitor H/Hs, transfuse if needed, IV protonix ordered, and GI consulted to see this AM.   Dr. Dorena Cookey GI to see this AM 06/01.     2.  HTN-  Monitor BPs, hold metoprolol, and Avapro RX,   PRN IV hydralazine.    3.  Other-  DVT prophylaxis with SCDs.      Code Status:    FULL CODE Family Communication:     Son at Bedside Time spent: 61 Minutes  Ron Parker [facility] Pager [phone]  If 7PM-7AM, please contact night-coverage [email or web address] Password [facility] 10/04/2012, 6:43 AM  Addendum  Patient seen and examined,agree with the assessment and plan as outlined above. Await results of colonoscopy, other wise stable, on brief episode of narrow complex tachycardia last night, otherwise tele unremarkable. Possible home in am tomorrow am.  Windell Norfolk MD

## 2012-10-04 NOTE — Progress Notes (Signed)
Pt had x1 episode of moderate amount of bright red rectal bleeding on the floor,will continue to monitor

## 2012-10-04 NOTE — Progress Notes (Signed)
[facility] PROGRESS NOTE  Tiffany [name] ZOX:[id number] DOB: 1926/11/26 DOA: 10/03/2012 PCP: Leanor Rubenstein, MD  I have seen and examined pt admitted per Dr Lovell Sheehan this am who is an 79yo with HTN admitted with rectal bleeding, no abd pain, and CT with diverticulosis. She states she has had episodes of BRBPR since arrival to the medical floor. Her hgb is stable at 11.7 this am, and she remains afebrile and hemodynamically stable.[facility]/Dr Madilyn Fireman to see pt this am for further recs. Will continue current management plan as per Dr Lovell Sheehan,  follow h/h and transfuse as clinically appropriate    [facility]  [facility] Pager [phone]. If 7PM-7AM, please contact night-coverage at [email or web address], password [facility] 10/04/2012, 8:21 AM  LOS: 1 day

## 2012-10-04 NOTE — Progress Notes (Signed)
Pt hasn't had any blood in her stool during my shift from 11-7pm with her two BMs

## 2012-10-04 NOTE — ED Provider Notes (Signed)
I have supervised the resident on the management of this patient and agree with the note above. I personally interviewed and examined the patient and my addendum is below.   Tiffany [name] is a 79 y.o. female hx of HTN, DM here with rectal bleeding. 2 episodes of bright red blood per rectum today. Mild LLQ pain as well. Occ positive, Hg stable. CT showed diverticulosis. Will admit for monitoring.    Richardean Canal, MD 10/04/12 [phone]

## 2012-10-04 NOTE — Progress Notes (Signed)
PT admitted from ED to room 5530 via stretcher,alert and oriented x3,son at bedside,VSS,IV to right AC,ambulates with cane with assist,oriented to room and call bell,denies pain or discomfort.

## 2012-10-05 ENCOUNTER — Other Ambulatory Visit: Payer: Medicare Other

## 2012-10-05 DIAGNOSIS — R197 Diarrhea, unspecified: Secondary | ICD-10-CM

## 2012-10-05 DIAGNOSIS — K625 Hemorrhage of anus and rectum: Secondary | ICD-10-CM

## 2012-10-05 DIAGNOSIS — I10 Essential (primary) hypertension: Secondary | ICD-10-CM

## 2012-10-05 LAB — HEMOGLOBIN AND HEMATOCRIT, BLOOD
HCT: 39 % (ref 36.0–46.0)
HCT: 34.2 % — ABNORMAL LOW (ref 36.0–46.0)
Hemoglobin: 13.1 g/dL (ref 12.0–15.0)
Hemoglobin: 11.3 g/dL — ABNORMAL LOW (ref 12.0–15.0)

## 2012-10-05 NOTE — Care Management Note (Signed)
    Page 1 of 2   10/08/2012     12:23:49 PM   CARE MANAGEMENT NOTE 10/08/2012  Patient:  Tiffany [name], Tiffany [name]   Account Number:  [id number]  Date Initiated:  10/05/2012  Documentation initiated by:  Letha Cape  Subjective/Objective Assessment:   dx rectal bleeding  admit- lives alone.  has a cane she uses. will be staying with daughter at discharge for a couple of days.     Action/Plan:   pt eval- rec hhpt   Anticipated DC Date:  10/08/2012   Anticipated DC Plan:  HOME W HOME HEALTH SERVICES      DC Planning Services  CM consult      [facility] Choice  HOME HEALTH   Choice offered to / List presented to:  C-1 Patient        HH arranged  HH-2 PT      [facility] agency  [facility]   Status of service:  Completed, signed off Medicare Important Message given?   (If response is "NO", the following Medicare IM given date fields will be blank) Date Medicare IM given:   Date Additional Medicare IM given:    Discharge Disposition:  HOME W HOME HEALTH SERVICES  Per UR Regulation:  Reviewed for med. necessity/level of care/duration of stay  If discussed at Long Length of Stay Meetings, dates discussed:    Comments:  10/08/12 12:04 Letha Cape RN, BSN [phone] patient is for possible dc today, await pt eval.  Per physical therapy rec hhpt, spoke with patient and daughter, they chose [facility] , referral made to [facility], Lupita Leash notified for hhpt.  Soc will begin 24-48 hrs post discharge.   Patient has mediclation coverage and transportation.  10/06/12 16:10 Letha Cape RN, BSN [phone] patient for colonscopy today.  10/05/12 16:48 Letha Cape RN, BSN [phone] patient lives alone, NCM will continue to follow for dc needs.

## 2012-10-05 NOTE — Progress Notes (Signed)
[facility] PROGRESS NOTE  Tiffany [name] ZOX:[id number] DOB: 08-28-1926 DOA: 10/03/2012 PCP: Leanor Rubenstein, MD  Assessment/Plan:  1. Rectal Bleeding - Probably diverticular or Lower GI,  -hgb stable,continue to Monitor H/Hs and  transfuse as needed -GI planning colonoscopy wed 6/4 to n/v this am>>inability to tolerate prep 2. HTN -continue Monitoring BPs, holding metoprolol, and Avapro RX, PRN IV hydralazine.  3.N/V/D -unclear etiology, obtain stool studies -supportive care, follow 4. Other- DVT prophylaxis with SCDs.   Code Status: full Family Communication: grandson at bedside Disposition Plan: to home when medically ready   Consultants:  GI  Procedures:  NONE  Antibiotics:  NONE  HPI/Subjective: Pt with increased N/V/D earlier this am- feels better, no further rectal bleeding.  Objective: Filed Vitals:   10/04/12 2246 10/05/12 0233 10/05/12 0502 10/05/12 0730  BP: 172/68 166/76 185/87 171/73  Pulse:  75 71 92  Temp:  97.9 F (36.6 C) 97.9 F (36.6 C) 98.5 F (36.9 C)  TempSrc:  Oral Oral Oral  Resp:  18 18 20   Height:      Weight:      SpO2:  97% 95% 96%    Intake/Output Summary (Last 24 hours) at 10/05/12 1009 Last data filed at 10/05/12 0900  Gross per 24 hour  Intake    900 ml  Output     30 ml  Net    870 ml   Filed Weights   10/04/12 0128  Weight: 79.198 kg (174 lb 9.6 oz)    Exam:   General:  lert and orientedx3, in NAD  Cardiovascular: RRR nl S1S2  Respiratory: CTAB  Abdomen: soft +BS NT/ND  Extremities: No cyanosis, no edema  Data Reviewed: Basic Metabolic Panel:  Recent Labs Lab 10/03/12 2044 10/03/12 2046 10/04/12 0536  NA 142 140 140  K 3.7 3.6 3.9  CL 106 103 106  CO2  --  27 25  GLUCOSE 140* 141* 116*  BUN 30* 30* 21  CREATININE 1.20* 1.18* 1.08  CALCIUM  --  9.9 9.2   Liver Function Tests:  Recent Labs Lab 10/03/12 2046  AST 16  ALT 13  ALKPHOS 76  BILITOT 0.4  PROT 6.4  ALBUMIN 3.6    No results found for this basename: LIPASE, AMYLASE,  in the last 168 hours No results found for this basename: AMMONIA,  in the last 168 hours CBC:  Recent Labs Lab 10/03/12 2024  10/04/12 0536 10/04/12 0806 10/04/12 1528 10/04/12 2343 10/05/12 0915  WBC 5.4  --  5.9  --   --   --   --   HGB 12.6  < > 11.9* 11.7* 10.9* 10.9* 13.1  HCT 38.3  < > 36.2 34.7* 33.0* 32.0* 39.0  MCV 92.7  --  93.1  --   --   --   --   PLT 154  --  149*  --   --   --   --   < > = values in this interval not displayed. Cardiac Enzymes: No results found for this basename: CKTOTAL, CKMB, CKMBINDEX, TROPONINI,  in the last 168 hours BNP (last 3 results) No results found for this basename: PROBNP,  in the last 8760 hours CBG: No results found for this basename: GLUCAP,  in the last 168 hours  No results found for this or any previous visit (from the past 240 hour(s)).   Studies: Ct Abdomen Pelvis W Contrast  10/03/2012   *RADIOLOGY REPORT*  Clinical Data: Left lower quadrant pain.  Rectal bleeding. Clinical suspicion for diverticulitis.  CT ABDOMEN AND PELVIS WITH CONTRAST  Technique:  Multidetector CT imaging of the abdomen and pelvis was performed following the standard protocol during bolus administration of intravenous contrast.  Contrast: OMNIPAQUE IOHEXOL 300 MG/ML  SOLN  Comparison: None.  Findings: A moderate size hiatal hernia is again seen.  Prior cholecystectomy again noted.  Tiny sub-centimeter cyst again seen in the peripheral anterior segment right hepatic lobe, however no liver masses are identified.  No evidence of biliary ductal dilatation.  A lateral left lower quadrant abdominal wall hernia is again seen just above the iliac crest which contains several small bowel loops.  No evidence of bowel obstruction.  Sigmoid diverticulosis is noted, which is partially obscured by artifact from left hip prosthesis.  Allowing for this limitation, there is no evidence of acute diverticulitis or  other inflammatory process or abnormal fluid collections.  No evidence of bowel obstruction.  The pancreas, spleen, and adrenal glands are normal in appearance. Small renal cysts are noted bilaterally however there is no evidence of renal mass or hydronephrosis.  No other soft tissue masses or lymphadenopathy identified.  Significant beam hardening artifact is seen due to left hip prosthesis. Previous hysterectomy noted.  Chronic protrusio acetabulae noted.  No evidence of fracture.  Advanced degenerative disc disease is seen throughout the lower thoracic and lumbar spine.  IMPRESSION:  1.  Exam is suboptimal due to artifact from left hip prosthesis. Sigmoid diverticulosis.  No definite radiographic evidence of diverticulitis. 2.  Stable left lower quadrant lateral abdominal wall hernia containing small bowel.  No evidence of bowel obstruction or other acute findings. 3.  Stable moderate hiatal hernia. 3.   Original Report Authenticated By: Myles Rosenthal, M.D.    Scheduled Meds: . levothyroxine  75 mcg Oral QAC breakfast  . pantoprazole (PROTONIX) IV  40 mg Intravenous Q12H   Continuous Infusions: . sodium chloride 100 mL/hr at 10/05/12 0759    Principal Problem:   Rectal bleeding Active Problems:   HTN (hypertension)    Time spent: 35    [facility] C  [facility] Pager [phone]. If 7PM-7AM, please contact night-coverage at [email or web address], password [facility] 10/05/2012, 10:09 AM  LOS: 2 days

## 2012-10-05 NOTE — Progress Notes (Signed)
Subjective: Having some nausea, vomiting, diarrhea. No abdominal pain. No blood in stool.  Objective: Vital signs in last 24 hours: Temp:  [97.9 F (36.6 C)-98.7 F (37.1 C)] 97.9 F (36.6 C) (06/02 0502) Pulse Rate:  [66-78] 71 (06/02 0502) Resp:  [18-20] 18 (06/02 0502) BP: (159-186)/(62-87) 185/87 mmHg (06/02 0502) SpO2:  [95 %-97 %] 95 % (06/02 0502) Weight change:  Last BM Date: 10/04/12  PE: GEN:  Overweight, NAD, non-toxic-appearing HEENT:  Somewhat dry mucous membranes ABD:  Soft, non-tender.  Lab Results: CBC    Component Value Date/Time   WBC 5.9 10/04/2012 0536   RBC 3.89 10/04/2012 0536   HGB 10.9* 10/04/2012 2343   HCT 32.0* 10/04/2012 2343   PLT 149* 10/04/2012 0536   MCV 93.1 10/04/2012 0536   MCH 30.6 10/04/2012 0536   MCHC 32.9 10/04/2012 0536   RDW 13.9 10/04/2012 0536   LYMPHSABS 1.4 07/06/2010 1446   MONOABS 0.9 07/06/2010 1446   EOSABS 0.0 07/06/2010 1446   BASOSABS 0.0 07/06/2010 1446   Assessment:  1.  Hematochezia.  Suspected diverticular.  Resolved.  However, nausea, vomiting and diarrhea today (as below) raise specter for possibility of infectious colitis as source of her hematochezia. 2.  Nausea, vomiting, diarrhea.  Query infectious enterocolitis. Doubt medication-related; she is not on antibiotics and is not taking any appreciable narcotic analgesics.  Plan:  1.  Patient's significant nausea will prevent our initiation of bowel prep today; will revisit tomorrow. 2.  Will check stool studies, GI pathogen panel as well as separate C. Diff PCR. 3.  Clear liquids for now. 4.  If her nausea and vomiting symptoms improve, consider starting bowel preparation tomorrow in anticipation for colonoscopy Wednesday 10/07/12.   Freddy Jaksch 10/05/2012, 8:50 AM

## 2012-10-06 LAB — CLOSTRIDIUM DIFFICILE BY PCR: Toxigenic C. Difficile by PCR: NEGATIVE

## 2012-10-06 LAB — BASIC METABOLIC PANEL
BUN: 11 mg/dL (ref 6–23)
CO2: 22 mEq/L (ref 19–32)
Calcium: 8.8 mg/dL (ref 8.4–10.5)
Chloride: 109 mEq/L (ref 96–112)
Creatinine, Ser: 1.03 mg/dL (ref 0.50–1.10)
GFR calc Af Amer: 57 mL/min — ABNORMAL LOW (ref >90)
GFR calc non Af Amer: 49 mL/min — ABNORMAL LOW (ref >90)
Glucose, Bld: 103 mg/dL — ABNORMAL HIGH (ref 70–99)
Potassium: 3.5 mEq/L (ref 3.5–5.1)
Sodium: 141 mEq/L (ref 135–145)

## 2012-10-06 LAB — CBC
HCT: 32.9 % — ABNORMAL LOW (ref 36.0–46.0)
Hemoglobin: 10.8 g/dL — ABNORMAL LOW (ref 12.0–15.0)
MCH: 30.3 pg (ref 26.0–34.0)
MCHC: 32.8 g/dL (ref 30.0–36.0)
MCV: 92.2 fL (ref 78.0–100.0)
Platelets: 137 10*3/uL — ABNORMAL LOW (ref 150–400)
RBC: 3.57 MIL/uL — ABNORMAL LOW (ref 3.87–5.11)
RDW: 14.4 % (ref 11.5–15.5)
WBC: 5.9 10*3/uL (ref 4.0–10.5)

## 2012-10-06 LAB — HEMOGLOBIN AND HEMATOCRIT, BLOOD
HCT: 37.1 % (ref 36.0–46.0)
Hemoglobin: 12.3 g/dL (ref 12.0–15.0)

## 2012-10-06 MED ORDER — PEG 3350-KCL-NA BICARB-NACL 420 G PO SOLR: 4000.0000 mL | Freq: Once | ORAL | Status: AC | PRN

## 2012-10-06 MED ORDER — PEG 3350-KCL-NA BICARB-NACL 420 G PO SOLR
4000.0000 mL | Freq: Once | ORAL | Status: AC
  Administered 2012-10-06: 4000 mL via ORAL
  Filled 2012-10-06: qty 4000

## 2012-10-06 MED ORDER — BISACODYL 5 MG PO TBEC: 10.0000 mg | DELAYED_RELEASE_TABLET | Freq: Once | ORAL | Status: DC

## 2012-10-06 NOTE — Progress Notes (Signed)
Subjective: Nausea and diarrhea improving.  Objective: Vital signs in last 24 hours: Temp:  [98.1 F (36.7 C)-98.4 F (36.9 C)] 98.1 F (36.7 C) (06/03 0529) Pulse Rate:  [71-95] 82 (06/03 0529) Resp:  [18-22] 18 (06/03 0529) BP: (152-179)/(66-83) 160/66 mmHg (06/03 0555) SpO2:  [93 %-98 %] 93 % (06/03 0529) Weight change:  Last BM Date: 10/05/12  PE: GEN:  NAD ABD:  Soft  Lab Results: CBC    Component Value Date/Time   WBC 5.9 10/06/2012 0445   RBC 3.57* 10/06/2012 0445   HGB 10.8* 10/06/2012 0445   HCT 32.9* 10/06/2012 0445   PLT 137* 10/06/2012 0445   MCV 92.2 10/06/2012 0445   MCH 30.3 10/06/2012 0445   MCHC 32.8 10/06/2012 0445   RDW 14.4 10/06/2012 0445   LYMPHSABS 1.4 07/06/2010 1446   MONOABS 0.9 07/06/2010 1446   EOSABS 0.0 07/06/2010 1446   BASOSABS 0.0 07/06/2010 1446   Assessment:  1.  Nausea and diarrhea, improving.  Unclear etiology.  Stool studies pending. 2.  Hematochezia.  Likely diverticular.  Infectious or inflammatory colitis other less likely possibilities.  Given patient's lack of abdominal pain, doubt ischemic colitis.  Plan:  1.  Clear liquids today, NPO after midnight. 2.  Colonoscopy tomorrow, if she can handle the prep today. 3.  Awaiting stool studies. 4.  Next step in management pending colonoscopy findings.   Martha Ellerby M 10/06/2012, 8:56 AM  

## 2012-10-06 NOTE — Progress Notes (Signed)
UR COMPLETED  

## 2012-10-06 NOTE — Progress Notes (Addendum)
[facility] PROGRESS NOTE  Tiffany [name] ZOX:[id number] DOB: [birth year] DOA: 10/03/2012 PCP: Leanor Rubenstein, MD  Assessment/Plan:  1. Rectal Bleeding - Probably diverticular or Lower GI,  -hgb stable,GI planning colonoscopy in am 6/4 -continue to follow and transfuse as appropriate -2. HTN -continue Monitoring BPs, holding metoprolol, and Avapro RX, PRN IV hydralazine.  3.N/V/D -unclear etiology, c.diff neg. Culture pending -continue supportive care, follow 4. Other- DVT prophylaxis with SCDs.   Code Status: full Family Communication: none at bedside Disposition Plan: to home when medically ready   Consultants:  GI  Procedures:  NONE  Antibiotics:  NONE  HPI/Subjective: Pt with bloody BM x1 so far today, denies n/v  Objective: Filed Vitals:   10/06/12 0208 10/06/12 0529 10/06/12 0555 10/06/12 1023  BP: 163/67 179/83 160/66 163/73  Pulse: 81 82  73  Temp: 98.1 F (36.7 C) 98.1 F (36.7 C)  98 F (36.7 C)  TempSrc: Oral Oral  Oral  Resp: 18 18  18   Height:      Weight:      SpO2: 95% 93%  97%    Intake/Output Summary (Last 24 hours) at 10/06/12 1138 Last data filed at 10/06/12 0830  Gross per 24 hour  Intake 2983.33 ml  Output      0 ml  Net 2983.33 ml   Filed Weights   10/04/12 0128  Weight: 79.198 kg (174 lb 9.6 oz)    Exam:   General:  alert and orientedx3, in NAD  Cardiovascular: RRR nl S1S2  Respiratory: CTAB  Abdomen: soft +BS NT/ND  Extremities: No cyanosis, no edema  Data Reviewed: Basic Metabolic Panel:  Recent Labs Lab 10/03/12 2044 10/03/12 2046 10/04/12 0536 10/06/12 0445  NA 142 140 140 141  K 3.7 3.6 3.9 3.5  CL 106 103 106 109  CO2  --  27 25 22   GLUCOSE 140* 141* 116* 103*  BUN 30* 30* 21 11  CREATININE 1.20* 1.18* 1.08 1.03  CALCIUM  --  9.9 9.2 8.8   Liver Function Tests:  Recent Labs Lab 10/03/12 2046  AST 16  ALT 13  ALKPHOS 76  BILITOT 0.4  PROT 6.4  ALBUMIN 3.6   No results found for  this basename: LIPASE, AMYLASE,  in the last 168 hours No results found for this basename: AMMONIA,  in the last 168 hours CBC:  Recent Labs Lab 10/03/12 2024  10/04/12 0536  10/04/12 1528 10/04/12 2343 10/05/12 0915 10/05/12 1527 10/06/12 0445  WBC 5.4  --  5.9  --   --   --   --   --  5.9  HGB 12.6  < > 11.9*  < > 10.9* 10.9* 13.1 11.3* 10.8*  HCT 38.3  < > 36.2  < > 33.0* 32.0* 39.0 34.2* 32.9*  MCV 92.7  --  93.1  --   --   --   --   --  92.2  PLT 154  --  149*  --   --   --   --   --  137*  < > = values in this interval not displayed. Cardiac Enzymes: No results found for this basename: CKTOTAL, CKMB, CKMBINDEX, TROPONINI,  in the last 168 hours BNP (last 3 results) No results found for this basename: PROBNP,  in the last 8760 hours CBG: No results found for this basename: GLUCAP,  in the last 168 hours  Recent Results (from the past 240 hour(s))  CLOSTRIDIUM DIFFICILE BY PCR     Status:  None   Collection Time    10/06/12  8:47 AM      Result Value Range Status   C difficile by pcr NEGATIVE  NEGATIVE Final     Studies: No results found.  Scheduled Meds: . bisacodyl  10 mg Oral Once  . levothyroxine  75 mcg Oral QAC breakfast  . pantoprazole (PROTONIX) IV  40 mg Intravenous Q12H  . polyethylene glycol-electrolytes  4,000 mL Oral Once   Continuous Infusions: . sodium chloride 100 mL/hr at 10/05/12 0759    Principal Problem:   Rectal bleeding Active Problems:   HTN (hypertension)    Time spent: 25    [facility]  [facility] Pager [phone]. If 7PM-7AM, please contact night-coverage at [email or web address], password [facility] 10/06/2012, 11:38 AM  LOS: 3 days

## 2012-10-07 ENCOUNTER — Encounter (HOSPITAL_COMMUNITY)
Admission: EM | Disposition: A | Discharge status: Home Health | Payer: Self-pay | Source: Home / Self Care | Attending: Internal Medicine

## 2012-10-07 ENCOUNTER — Encounter (HOSPITAL_COMMUNITY): Payer: Self-pay | Admitting: Gastroenterology

## 2012-10-07 HISTORY — PX: COLONOSCOPY: SHX5424

## 2012-10-07 LAB — CBC
HCT: 33.5 % — ABNORMAL LOW (ref 36.0–46.0)
Hemoglobin: 11.4 g/dL — ABNORMAL LOW (ref 12.0–15.0)
MCH: 31.1 pg (ref 26.0–34.0)
MCHC: 34 g/dL (ref 30.0–36.0)
MCV: 91.5 fL (ref 78.0–100.0)
Platelets: 145 10*3/uL — ABNORMAL LOW (ref 150–400)
RBC: 3.66 MIL/uL — ABNORMAL LOW (ref 3.87–5.11)
RDW: 14.1 % (ref 11.5–15.5)
WBC: 5.9 10*3/uL (ref 4.0–10.5)

## 2012-10-07 LAB — GI PATHOGEN PANEL BY PCR, STOOL
C difficile toxin A/B: NEGATIVE
Campylobacter by PCR: NEGATIVE
Cryptosporidium by PCR: NEGATIVE
E coli (ETEC) LT/ST: NEGATIVE
E coli (STEC): NEGATIVE
E coli 0157 by PCR: NEGATIVE
G lamblia by PCR: NEGATIVE
Norovirus GI/GII: NEGATIVE
Rotavirus A by PCR: NEGATIVE
Salmonella by PCR: NEGATIVE
Shigella by PCR: NEGATIVE

## 2012-10-07 SURGERY — COLONOSCOPY
Anesthesia: Moderate Sedation

## 2012-10-07 MED ORDER — IRBESARTAN 300 MG PO TABS
300.0000 mg | ORAL_TABLET | Freq: Every morning | ORAL | Status: DC
  Administered 2012-10-07 – 2012-10-08 (×2): 300 mg via ORAL
  Filled 2012-10-07 (×2): qty 1

## 2012-10-07 MED ORDER — DIPHENHYDRAMINE HCL 50 MG/ML IJ SOLN
INTRAMUSCULAR | Status: AC
  Filled 2012-10-07: qty 1

## 2012-10-07 MED ORDER — FENTANYL CITRATE 0.05 MG/ML IJ SOLN
INTRAMUSCULAR | Status: AC
  Filled 2012-10-07: qty 4

## 2012-10-07 MED ORDER — MIDAZOLAM HCL 5 MG/5ML IJ SOLN
INTRAMUSCULAR | Status: DC | PRN
  Administered 2012-10-07 (×2): 1 mg via INTRAVENOUS
  Administered 2012-10-07 (×2): 2 mg via INTRAVENOUS

## 2012-10-07 MED ORDER — FENTANYL CITRATE 0.05 MG/ML IJ SOLN
INTRAMUSCULAR | Status: DC | PRN
  Administered 2012-10-07 (×2): 25 ug via INTRAVENOUS

## 2012-10-07 MED ORDER — SODIUM CHLORIDE 0.9 % IV SOLN
INTRAVENOUS | Status: DC
  Administered 2012-10-07: 500 mL via INTRAVENOUS

## 2012-10-07 MED ORDER — MIDAZOLAM HCL 5 MG/ML IJ SOLN
INTRAMUSCULAR | Status: AC
  Filled 2012-10-07: qty 2

## 2012-10-07 MED ORDER — METOPROLOL TARTRATE 50 MG PO TABS
50.0000 mg | ORAL_TABLET | Freq: Two times a day (BID) | ORAL | Status: DC
  Administered 2012-10-07 – 2012-10-08 (×3): 50 mg via ORAL
  Filled 2012-10-07 (×4): qty 1

## 2012-10-07 NOTE — Interval H&P Note (Signed)
History and Physical Interval Note:  10/07/2012 12:18 PM  Karen Chafe  has presented today for surgery, with the diagnosis of blood in stool  The various methods of treatment have been discussed with the patient and family. After consideration of risks, benefits and other options for treatment, the patient has consented to  Procedure(s): COLONOSCOPY (N/A) as a surgical intervention .  The patient's history has been reviewed, patient examined, no change in status, stable for surgery.  I have reviewed the patient's chart and labs.  Questions were answered to the patient's satisfaction.     Velisa Regnier M  Assessment:  1.  Hematochezia.  Suspect diverticular.  Plan:  1.  Colonoscopy today. 2.  Risks (bleeding, infection, bowel perforation that could require surgery, sedation-related changes in cardiopulmonary systems), benefits (identification and possible treatment of source of symptoms, exclusion of certain causes of symptoms), and alternatives (watchful waiting, radiographic imaging studies, empiric medical treatment) of colonoscopy were explained to patient in detail and patient wishes to proceed.

## 2012-10-07 NOTE — Op Note (Signed)
[facility] [address] [place], [address]   COLONOSCOPY PROCEDURE REPORT  PATIENT: Tiffany [name], Tiffany [name]  MR#: [id number] BIRTHDATE: [birth year] , 85  yrs. old GENDER: Female ENDOSCOPIST: Willis Modena, MD REFERRED AO:[facility] PROCEDURE DATE:  10/07/2012 PROCEDURE:   Colonoscopy, diagnostic ASA CLASS:   Class III INDICATIONS:hematochezia. MEDICATIONS: Fentanyl 50 mcg IV and Versed 6 mg IV DESCRIPTION OF PROCEDURE:   After the risks benefits and alternatives of the procedure were thoroughly explained, informed consent was obtained.  Digital rectal exam revealed small rectal prolapse.        The Pentax Ped Colon [id number]  endoscope was introduced through the anus and advanced to the terminal ileum which was intubated for a short distance. No adverse events experienced.   The quality of the prep was good.  The instrument was then slowly withdrawn as the colon was fully examined.    Findings:  Digital rectal exam showed small rectal proloapse.  Prep quality was good.  Mild internal hemorrhoids, otherwise normal retroflexed view of rectum.    Several sigmoid diverticula seen. No other polyps, masses, vascular ectasias, or inflammatory changes were seen.  Normal distal 10cm of the terminal ileum.  There was no old or fresh blood seen to the extent of our examination. Withdrawal time was about 10 minutes .  The scope was withdrawn and the procedure completed.  ENDOSCOPIC IMPRESSION:     As above.  Suspect bleeding was diverticular in origin.  No ongoing bleeding seen.  RECOMMENDATIONS:     1.  Watch for potential complications of procedure. 2.  Advance diet. 3.  Once patient able to tolerate diet, it would be OK from GI perspective to discharge patient home. 4.  Would not repeat routine screening colonoscopies, in absence of new/interval symptoms, in light of patient's age and comorbidities. 5.  Will sign-off; please call with  questions.  eSigned:  Willis Modena, MD 10/07/2012 1:22 PM  cc:

## 2012-10-07 NOTE — H&P (View-Only) (Signed)
Subjective: Nausea and diarrhea improving.  Objective: Vital signs in last 24 hours: Temp:  [98.1 F (36.7 C)-98.4 F (36.9 C)] 98.1 F (36.7 C) (06/03 0529) Pulse Rate:  [71-95] 82 (06/03 0529) Resp:  [18-22] 18 (06/03 0529) BP: (152-179)/(66-83) 160/66 mmHg (06/03 0555) SpO2:  [93 %-98 %] 93 % (06/03 0529) Weight change:  Last BM Date: 10/05/12  PE: GEN:  NAD ABD:  Soft  Lab Results: CBC    Component Value Date/Time   WBC 5.9 10/06/2012 0445   RBC 3.57* 10/06/2012 0445   HGB 10.8* 10/06/2012 0445   HCT 32.9* 10/06/2012 0445   PLT 137* 10/06/2012 0445   MCV 92.2 10/06/2012 0445   MCH 30.3 10/06/2012 0445   MCHC 32.8 10/06/2012 0445   RDW 14.4 10/06/2012 0445   LYMPHSABS 1.4 07/06/2010 1446   MONOABS 0.9 07/06/2010 1446   EOSABS 0.0 07/06/2010 1446   BASOSABS 0.0 07/06/2010 1446   Assessment:  1.  Nausea and diarrhea, improving.  Unclear etiology.  Stool studies pending. 2.  Hematochezia.  Likely diverticular.  Infectious or inflammatory colitis other less likely possibilities.  Given patient's lack of abdominal pain, doubt ischemic colitis.  Plan:  1.  Clear liquids today, NPO after midnight. 2.  Colonoscopy tomorrow, if she can handle the prep today. 3.  Awaiting stool studies. 4.  Next step in management pending colonoscopy findings.   Tiffany [name] 10/06/2012, 8:56 AM

## 2012-10-07 NOTE — Progress Notes (Addendum)
[facility] PROGRESS NOTE  Tiffany [name] ZOX:[id number] DOB: 12-Feb-1927 DOA: 10/03/2012 PCP: Leanor Rubenstein, MD  Assessment/Plan: 1. Rectal bleeding secondary to diverticulitis or external hemorrhoid.  -External hemorrhoid (approx 2 cm) present on exam -Additional episodes of bleeding overnight during bowel prep. -Colonoscopy to be performed (6/4), patient was able to tolerate bowel prep and is ready for procedure -Patient is HgB stable, continue to monitor BP and HgB -No blood transfusions necessary.   2.  Nausea -supportive care.  No longer complaining of nausea.  3.  Lower extremity swelling -Bilateral and chronic.   -will d/c ivf after colonoscopy  4.  Non sustained SVT -Patient with one episode of SVT over night. -Asymptomatic -Monitor on tele.  5.  HTN -elevated.  Will restart metoprolol and Irbesartan today.   DVT prophylaxis with SCDs   Code Status: FULL Family Communication:   Disposition Plan: inpatient.    Consultants:  GI consult for colonoscopy  Procedures:  Colonoscopy  Antibiotics:  none  HPI/Subjective: 79 y.o. female presenting with BRBPR x 4 days. She had several episodes of rectal in one day that "filled the toilet bowel" with bright red blood that went away for a day and came back. She denies any pain with bowel movements and  abdominal pain. She denies a history of constipation/straining and a history of colon cancer. She admits to having periodic periods where her heart "skips a beat" but otherwise has no cardiac history.   Objective: Filed Vitals:   10/06/12 1833 10/06/12 2034 10/06/12 2256 10/07/12 0549  BP: 180/71 174/71 175/67 173/74  Pulse: 84 87  82  Temp: 98 F (36.7 C) 97.6 F (36.4 C)  98.5 F (36.9 C)  TempSrc: Oral Oral  Oral  Resp: 18 20  18   Height:      Weight:      SpO2: 97% 94%  97%    Intake/Output Summary (Last 24 hours) at 10/07/12 0936 Last data filed at 10/07/12 0600  Gross per 24 hour  Intake    750  ml  Output    950 ml  Net   -200 ml   Filed Weights   10/04/12 0128  Weight: 79.198 kg (174 lb 9.6 oz)    Exam:   General:  Pleasant, alert and oriented elderly female x 3 in no apparent distress, sitting on side of bed.   Cardiovascular: RRR, no m/g/r - occasional extra beat  Respiratory: CTA  Abdomen: hyperactive BS, soft, no masses, mild tenderness to deep palpation of LLQ   Musculoskeletal: Normal ROM, mild edema of lower extremities. 1-2+ pitting edema   Data Reviewed: Basic Metabolic Panel:  Recent Labs Lab 10/03/12 2044 10/03/12 2046 10/04/12 0536 10/06/12 0445  NA 142 140 140 141  K 3.7 3.6 3.9 3.5  CL 106 103 106 109  CO2  --  27 25 22   GLUCOSE 140* 141* 116* 103*  BUN 30* 30* 21 11  CREATININE 1.20* 1.18* 1.08 1.03  CALCIUM  --  9.9 9.2 8.8   Liver Function Tests:  Recent Labs Lab 10/03/12 2046  AST 16  ALT 13  ALKPHOS 76  BILITOT 0.4  PROT 6.4  ALBUMIN 3.6   CBC:  Recent Labs Lab 10/03/12 2024  10/04/12 0536  10/04/12 2343 10/05/12 0915 10/05/12 1527 10/06/12 0445 10/06/12 2239  WBC 5.4  --  5.9  --   --   --   --  5.9  --   HGB 12.6  < > 11.9*  < >  10.9* 13.1 11.3* 10.8* 12.3  HCT 38.3  < > 36.2  < > 32.0* 39.0 34.2* 32.9* 37.1  MCV 92.7  --  93.1  --   --   --   --  92.2  --   PLT 154  --  149*  --   --   --   --  137*  --   < > = values in this interval not displayed.   Recent Results (from the past 240 hour(s))  CLOSTRIDIUM DIFFICILE BY PCR     Status: None   Collection Time    10/06/12  8:47 AM      Result Value Range Status   C difficile by pcr NEGATIVE  NEGATIVE Final     Studies: No results found.  Scheduled Meds: . bisacodyl  10 mg Oral Once  . levothyroxine  75 mcg Oral QAC breakfast  . pantoprazole (PROTONIX) IV  40 mg Intravenous Q12H   Continuous Infusions: . sodium chloride 50 mL/hr at 10/07/12 8119    Principal Problem:   Rectal bleeding Active Problems:   HTN (hypertension)     Lezlie Lye PA-S Algis Downs PA-C / APP [facility] Pager [phone]. If 7PM-7AM, please contact night-coverage at [email or web address], password [facility] 10/07/2012, 9:36 AM  LOS: 4 days

## 2012-10-07 NOTE — Progress Notes (Signed)
Pt bleeding from rectum with every BM. Pt has been drinking bowel prep for procedure in AM. Blood appears to be coming from hemorrhoids. On call NP notified, BP 175/67, & stat H&H ordered & within normal limits. Pt is currently A&O. Will continue to monitor.

## 2012-10-08 LAB — BASIC METABOLIC PANEL
BUN: 14 mg/dL (ref 6–23)
CO2: 23 mEq/L (ref 19–32)
Calcium: 9 mg/dL (ref 8.4–10.5)
Chloride: 110 mEq/L (ref 96–112)
Creatinine, Ser: 1.06 mg/dL (ref 0.50–1.10)
GFR calc Af Amer: 55 mL/min — ABNORMAL LOW (ref >90)
GFR calc non Af Amer: 47 mL/min — ABNORMAL LOW (ref >90)
Glucose, Bld: 108 mg/dL — ABNORMAL HIGH (ref 70–99)
Potassium: 3.5 mEq/L (ref 3.5–5.1)
Sodium: 141 mEq/L (ref 135–145)

## 2012-10-08 LAB — CBC
HCT: 31.5 % — ABNORMAL LOW (ref 36.0–46.0)
Hemoglobin: 10.5 g/dL — ABNORMAL LOW (ref 12.0–15.0)
MCH: 30.5 pg (ref 26.0–34.0)
MCHC: 33.3 g/dL (ref 30.0–36.0)
MCV: 91.6 fL (ref 78.0–100.0)
Platelets: 135 10*3/uL — ABNORMAL LOW (ref 150–400)
RBC: 3.44 MIL/uL — ABNORMAL LOW (ref 3.87–5.11)
RDW: 14.3 % (ref 11.5–15.5)
WBC: 6.7 10*3/uL (ref 4.0–10.5)

## 2012-10-08 MED ORDER — HYDROCORTISONE 2.5 % RE CREA: TOPICAL_CREAM | RECTAL | Status: DC

## 2012-10-08 MED ORDER — HYDROCORTISONE ACETATE 25 MG RE SUPP: 25.0000 mg | Freq: Two times a day (BID) | RECTAL | Status: DC

## 2012-10-08 NOTE — Discharge Summary (Signed)
Physician Discharge Summary  Tiffany [name] ZOX:[id number] DOB: 11/28/1926 DOA: 10/03/2012  PCP: Leanor Rubenstein, MD  Admit date: 10/03/2012 Discharge date: 10/08/2012  Time spent: 45  minutes  Recommendations for Outpatient Follow-up:  1. Check CBC in one week to monitor hemoglobin.  2. Acute blood loss anemia stable for outpatient followup 3. Monitor hemorrhoids.  Discussed referral to [facility] for outpatient consultation. 4.   Monitor blood pressure  Discharge Diagnoses:  Principal Problem:   Rectal bleeding Active Problems:   HTN (hypertension)   Discharge Condition: Stable, no longer bleeding   Diet recommendation: Regular  Filed Weights   10/04/12 0128  Weight: 79.198 kg (174 lb 9.6 oz)    History of present illness:  Tiffany [name] is a 79 y.o. female who presents to the ED with complains of BRBPR X 3 episodes  She denied having any ABD pain or nausea or vomiting or fevers or chills. She denies having any similar previous episodes. She was evaluated in the ED and was found to have an initial Hb = 12.9, and a ct SCan of the ABD /pelvis was performed and revealed diverticulosis. She denied having any weakness SOB, or chest pain. She was referred for medical admission.    Hospital Course:  1. Rectal bleeding secondary to diverticular bleeding and Internal as well as external hemorrhoids.  -Colonoscopy performed (6/4) -Patient is HgB stable, HgB was monitored closely -No blood transfusions necessary during this admission. -Current HgB (6/5) 10.5, stable. Bleeding has completely subsided at the time of discharge. -Colonoscopy results: No active bleeding, masses, vascular ectasias, or inflammatory changes were seen. Several sigmoid diverticula seen and mild internal hemorrhoids.  2. Lower extremity swelling  -Bilateral and chronic. Symptomatic.  3.Non sustained SVT  -Patient with one episode of 10 beats of SVT when getting up to go to the bathroom. -Asymptomatic, no further  episodes, Metoprolol was resumed.  4. HTN  -Originally BP med were held due to bleeding.  On 6/4 BP meds were added back  -BP is stable. Continue to monitor outpatient  Procedures:  Colonoscopy w/Dr. Dulce Sellar 10/07/12  Consultations:  Gastroenterology  Discharge Exam: Filed Vitals:   10/07/12 2147 10/08/12 0527 10/08/12 0900 10/08/12 0951  BP: 137/71 154/75 136/74 131/81  Pulse: 72 66 74 79  Temp: 98.5 F (36.9 C) 98.5 F (36.9 C) 98.6 F (37 C)   TempSrc: Oral Oral Oral   Resp: 18 18 18    Height:      Weight:      SpO2: 96% 96% 98%     General: WDWN elderly female in NAD, A&O with clear speech.  (Talkative) Cardiovascular: rrr, no m/g/r Respiratory: CTAB, no w/c/r, no accessory muscle use Abdomen:  Normoactive BS, soft, non tender, no masses Extremities:  Bilateral lower extremity edema 1+ pitting.  5/5 strength in each.  Discharge Instructions     Medication List    STOP taking these medications       diphenoxylate-atropine 2.5-0.025 MG per tablet  Commonly known as:  LOMOTIL     oxyCODONE-acetaminophen 5-325 MG per tablet  Commonly known as:  PERCOCET/ROXICET      TAKE these medications       CALCIUM 1200 PO  Take 1,200 mg by mouth daily.     irbesartan 300 MG tablet  Commonly known as:  AVAPRO  Take 300 mg by mouth every morning.     levothyroxine 75 MCG tablet  Commonly known as:  SYNTHROID, LEVOTHROID  Take 75 mcg by mouth daily before breakfast.  metoprolol 50 MG tablet  Commonly known as:  LOPRESSOR  Take 50 mg by mouth 2 (two) times daily.       Allergies  Allergen Reactions  . Codeine Nausea Only  . Morphine And Related Anxiety and Other (See Comments)    Feel very unwell and out there  . Sulfa Antibiotics Hives and Itching   Follow-up Information   Follow up with Leanor Rubenstein, MD. Schedule an appointment as soon as possible for a visit in 1 week.   Contact information:   [address] [place] [address] [phone]        Call [facility]. (if needed)    Contact information:   [address] [place] [address] [phone]       The results of significant diagnostics from this hospitalization (including imaging, microbiology, ancillary and laboratory) are listed below for reference.    Significant Diagnostic Studies: Ct Abdomen Pelvis W Contrast  10/03/2012   *RADIOLOGY REPORT*  Clinical Data: Left lower quadrant pain.  Rectal bleeding. Clinical suspicion for diverticulitis.  CT ABDOMEN AND PELVIS WITH CONTRAST  Technique:  Multidetector CT imaging of the abdomen and pelvis was performed following the standard protocol during bolus administration of intravenous contrast.  Contrast: OMNIPAQUE IOHEXOL 300 MG/ML  SOLN  Comparison: None.  Findings: A moderate size hiatal hernia is again seen.  Prior cholecystectomy again noted.  Tiny sub-centimeter cyst again seen in the peripheral anterior segment right hepatic lobe, however no liver masses are identified.  No evidence of biliary ductal dilatation.  A lateral left lower quadrant abdominal wall hernia is again seen just above the iliac crest which contains several small bowel loops.  No evidence of bowel obstruction.  Sigmoid diverticulosis is noted, which is partially obscured by artifact from left hip prosthesis.  Allowing for this limitation, there is no evidence of acute diverticulitis or other inflammatory process or abnormal fluid collections.  No evidence of bowel obstruction.  The pancreas, spleen, and adrenal glands are normal in appearance. Small renal cysts are noted bilaterally however there is no evidence of renal mass or hydronephrosis.  No other soft tissue masses or lymphadenopathy identified.  Significant beam hardening artifact is seen due to left hip prosthesis. Previous hysterectomy noted.  Chronic protrusio acetabulae noted.  No evidence of fracture.  Advanced degenerative disc disease is seen throughout  the lower thoracic and lumbar spine.  IMPRESSION:  1.  Exam is suboptimal due to artifact from left hip prosthesis. Sigmoid diverticulosis.  No definite radiographic evidence of diverticulitis. 2.  Stable left lower quadrant lateral abdominal wall hernia containing small bowel.  No evidence of bowel obstruction or other acute findings. 3.  Stable moderate hiatal hernia. 3.   Original Report Authenticated By: Myles Rosenthal, M.D.    Microbiology: Recent Results (from the past 240 hour(s))  CLOSTRIDIUM DIFFICILE BY PCR     Status: None   Collection Time    10/06/12  8:47 AM      Result Value Range Status   C difficile by pcr NEGATIVE  NEGATIVE Final  STOOL CULTURE     Status: None   Collection Time    10/06/12  8:47 AM      Result Value Range Status   Specimen Description STOOL   Final   Special Requests NONE   Final   Culture Culture reincubated for better growth   Final   Report Status PENDING   Incomplete     Labs:  Basic Metabolic Panel:  Recent Labs Lab 10/03/12 2044 10/03/12 2046 10/04/12 0536 10/06/12 0445 10/08/12 0632  NA 142 140 140 141 141  K 3.7 3.6 3.9 3.5 3.5  CL 106 103 106 109 110  CO2  --  27 25 22 23   GLUCOSE 140* 141* 116* 103* 108*  BUN 30* 30* 21 11 14   CREATININE 1.20* 1.18* 1.08 1.03 1.06  CALCIUM  --  9.9 9.2 8.8 9.0   Liver Function Tests:  Recent Labs Lab 10/03/12 2046  AST 16  ALT 13  ALKPHOS 76  BILITOT 0.4  PROT 6.4  ALBUMIN 3.6   CBC:  Recent Labs Lab 10/03/12 2024  10/04/12 0536  10/05/12 1527 10/06/12 0445 10/06/12 2239 10/07/12 0918 10/08/12 0632  WBC 5.4  --  5.9  --   --  5.9  --  5.9 6.7  HGB 12.6  < > 11.9*  < > 11.3* 10.8* 12.3 11.4* 10.5*  HCT 38.3  < > 36.2  < > 34.2* 32.9* 37.1 33.5* 31.5*  MCV 92.7  --  93.1  --   --  92.2  --  91.5 91.6  PLT 154  --  149*  --   --  137*  --  145* 135*  < > = values in this interval not displayed.     Signed:  Maris Berger, PA-S [place] / [facility] 10/08/2012, 11:24 AM   Attending Patient seen and examined, agree with the assessment and plan as outlined above. Thankfully continues to have no further hematochezia. She has tolerated advancement in her diet and is stable for discharge.  S Ghimire

## 2012-10-08 NOTE — Evaluation (Signed)
Physical Therapy Evaluation Patient Details Name: Tiffany [name] MRN: [id number] DOB: 06/18/1926 Today's Date: 10/08/2012 Time: 6213-0865 PT Time Calculation (min): 33 min  PT Assessment / Plan / Recommendation Clinical Impression  79 yo female with hx of RTKR, 5 L hip surgeries, bilateral mastectomies admitted with GI bleed.  Pt has some generalized deconditioning.  She will benefit from followup HHPT to prepare her to eventually return to her own home.     PT Assessment  Patient needs continued PT services    Follow Up Recommendations  Home health PT    Does the patient have the potential to tolerate intense rehabilitation      Barriers to Discharge        Equipment Recommendations  None recommended by PT    Recommendations for Other Services     Frequency Min 3X/week    Precautions / Restrictions     Pertinent Vitals/Pain Pt with c/o pain in right arm from swelling      Mobility  Bed Mobility Bed Mobility: Supine to Sit;Sit to Supine Supine to Sit: 7: Independent Sit to Supine: 7: Independent Transfers Transfers: Sit to Stand;Stand to Sit Sit to Stand: 6: Modified independent (Device/Increase time) Stand to Sit: 6: Modified independent (Device/Increase time) Ambulation/Gait Ambulation/Gait Assistance: 5: Supervision Ambulation Distance (Feet): 100 Feet Assistive device: Straight cane Ambulation/Gait Assistance Details: supervision for balance Gait Pattern: Step-through pattern;Trendelenburg Gait velocity: decr General Gait Details: pt using straight cane. She has some impairment because she does not have her shoe with shoe lift here Stairs: No Wheelchair Mobility Wheelchair Mobility: No    Exercises General Exercises - Lower Extremity Ankle Circles/Pumps: AROM;Both;10 reps;Supine Quad Sets: AROM;Both;10 reps;Supine Gluteal Sets: AROM;Both;10 reps;Supine Straight Leg Raises: AROM;Both;10 reps;Supine Hip Flexion/Marching: AROM;Both;10 reps;Supine Other  Exercises Other Exercises: pt instructed to do elevation with right arm Other Exercises: pt issued written copy of exercises for LE and core exercises and hard copy placed in chart Other Exercises: Pt instructed to do frequent short bouts of activity throughout the day   PT Diagnosis: Generalized weakness;Abnormality of gait  PT Problem List: Decreased activity tolerance;Decreased balance;Decreased strength PT Treatment Interventions: Gait training;Therapeutic exercise;Balance training;Patient/family education   PT Goals Acute Rehab PT Goals PT Goal Formulation: With patient Time For Goal Achievement: 10/22/12 Potential to Achieve Goals: Good Pt will Ambulate: >150 feet;with modified independence PT Goal: Ambulate - Progress: Goal set today Pt will Go Up / Down Stairs: 3-5 stairs;with modified independence PT Goal: Up/Down Stairs - Progress: Goal set today Pt will Perform Home Exercise Program: Independently PT Goal: Perform Home Exercise Program - Progress: Goal set today  Visit Information  Last PT Received On: 10/08/12 Assistance Needed: +1    Subjective Data  Subjective: pt reports she has done exercises in the past for her TKR and THR Patient Stated Goal: to go home with daughter in law for a little while   Prior Functioning  Home Living Lives With: Family (to go home with son and daughter in law) Available Help at Discharge: Family Type of Home: House Home Access: Stairs to enter Secretary/administrator of Steps: 3 Entrance Stairs-Rails: Right Home Layout: Multi-level Alternate Level Stairs-Number of Steps: split level home Home Adaptive Equipment: Straight cane;Walker - rolling Prior Function Level of Independence: Independent Driving: Yes Communication Communication: No difficulties    Cognition  Cognition Arousal/Alertness: Awake/alert Behavior During Therapy: WFL for tasks assessed/performed Overall Cognitive Status: Within Functional Limits for tasks  assessed    Extremity/Trunk Assessment Right Upper Extremity Assessment RUE  ROM/Strength/Tone: Deficits RUE ROM/Strength/Tone Deficits: pt with edema in forearm from multiple IV sticks. Right Lower Extremity Assessment RLE ROM/Strength/Tone: Deficits RLE ROM/Strength/Tone Deficits: pt with edema in leg with some pitting from sock constriction.  healed incision from old TKR Left Lower Extremity Assessment LLE ROM/Strength/Tone: Deficits LLE ROM/Strength/Tone Deficits: pt with edema in leg with some pitting from sock constriction.  leg is shorter in length and she has hx of 5 total hip surgeries/revisions. pt has some decrease in strength and slight flexion contracture at knee Trunk Assessment Trunk Assessment: Other exceptions Trunk Exceptions: hx of bilateral mastectomies generalized muscle atrophy   Balance Balance Balance Assessed: Yes Static Sitting Balance Static Sitting - Balance Support: No upper extremity supported Static Sitting - Level of Assistance: 7: Independent Static Sitting - Comment/# of Minutes: > 10  minutes Static Standing Balance Static Standing - Balance Support: No upper extremity supported Static Standing - Level of Assistance: 7: Independent  End of Session PT - End of Session Activity Tolerance: Patient tolerated treatment well Patient left: with family/visitor present;in bed Nurse Communication: Mobility status  GP    Rosey Bath K. Manson Passey, [place] [phone] 10/08/2012, 1:35 PM

## 2012-10-08 NOTE — Progress Notes (Signed)
Tiffany [name] to be D/C'd Home per MD order.  Discussed with the patient and all questions fully answered.    Medication List    STOP taking these medications       diphenoxylate-atropine 2.5-0.025 MG per tablet  Commonly known as:  LOMOTIL     oxyCODONE-acetaminophen 5-325 MG per tablet  Commonly known as:  PERCOCET/ROXICET      TAKE these medications       CALCIUM 1200 PO  Take 1,200 mg by mouth daily.     irbesartan 300 MG tablet  Commonly known as:  AVAPRO  Take 300 mg by mouth every morning.     levothyroxine 75 MCG tablet  Commonly known as:  SYNTHROID, LEVOTHROID  Take 75 mcg by mouth daily before breakfast.     metoprolol 50 MG tablet  Commonly known as:  LOPRESSOR  Take 50 mg by mouth 2 (two) times daily.        VVS, Skin clean, dry and intact without evidence of skin break down, no evidence of skin tears noted. IV catheter discontinued intact. Site without signs and symptoms of complications. Dressing and pressure applied.  An After Visit Summary was printed and given to the patient. Patient escorted via WC, and D/C home via private auto.  Kennyth Arnold D 10/08/2012 12:53 PM

## 2012-10-09 ENCOUNTER — Encounter (HOSPITAL_COMMUNITY): Payer: Self-pay | Admitting: Gastroenterology

## 2012-10-10 LAB — STOOL CULTURE

## 2012-10-19 ENCOUNTER — Encounter (HOSPITAL_COMMUNITY): Payer: Self-pay | Admitting: Emergency Medicine

## 2012-10-19 ENCOUNTER — Emergency Department (HOSPITAL_COMMUNITY): Payer: Medicare Other

## 2012-10-19 ENCOUNTER — Emergency Department (HOSPITAL_COMMUNITY)
Admission: EM | Admit: 2012-10-19 | Discharge: 2012-10-19 | Disposition: A | Discharge status: Home / Self Care | Payer: Medicare Other | Attending: Emergency Medicine | Admitting: Emergency Medicine

## 2012-10-19 DIAGNOSIS — F19239 Other psychoactive substance dependence with withdrawal, unspecified: Secondary | ICD-10-CM | POA: Insufficient documentation

## 2012-10-19 DIAGNOSIS — Z85828 Personal history of other malignant neoplasm of skin: Secondary | ICD-10-CM | POA: Insufficient documentation

## 2012-10-19 DIAGNOSIS — I10 Essential (primary) hypertension: Secondary | ICD-10-CM | POA: Insufficient documentation

## 2012-10-19 DIAGNOSIS — E039 Hypothyroidism, unspecified: Secondary | ICD-10-CM | POA: Insufficient documentation

## 2012-10-19 DIAGNOSIS — I491 Atrial premature depolarization: Secondary | ICD-10-CM

## 2012-10-19 DIAGNOSIS — F19939 Other psychoactive substance use, unspecified with withdrawal, unspecified: Secondary | ICD-10-CM | POA: Insufficient documentation

## 2012-10-19 DIAGNOSIS — Z79899 Other long term (current) drug therapy: Secondary | ICD-10-CM | POA: Insufficient documentation

## 2012-10-19 LAB — CBC WITH DIFFERENTIAL/PLATELET
Basophils Absolute: 0.1 10*3/uL (ref 0.0–0.1)
Basophils Relative: 1 % (ref 0–1)
Eosinophils Absolute: 0.1 10*3/uL (ref 0.0–0.7)
Eosinophils Relative: 1 % (ref 0–5)
HCT: 35.5 % — ABNORMAL LOW (ref 36.0–46.0)
Hemoglobin: 11.7 g/dL — ABNORMAL LOW (ref 12.0–15.0)
Lymphocytes Relative: 15 % (ref 12–46)
Lymphs Abs: 1.2 10*3/uL (ref 0.7–4.0)
MCH: 30.5 pg (ref 26.0–34.0)
MCHC: 33 g/dL (ref 30.0–36.0)
MCV: 92.7 fL (ref 78.0–100.0)
Monocytes Absolute: 0.9 10*3/uL (ref 0.1–1.0)
Monocytes Relative: 12 % (ref 3–12)
Neutro Abs: 5.6 10*3/uL (ref 1.7–7.7)
Neutrophils Relative %: 71 % (ref 43–77)
Platelets: 192 10*3/uL (ref 150–400)
RBC: 3.83 MIL/uL — ABNORMAL LOW (ref 3.87–5.11)
RDW: 14.1 % (ref 11.5–15.5)
WBC: 7.9 10*3/uL (ref 4.0–10.5)

## 2012-10-19 LAB — D-DIMER, QUANTITATIVE: D-Dimer, Quant: 3.12 ug/mL-FEU — ABNORMAL HIGH (ref 0.00–0.48)

## 2012-10-19 LAB — POCT I-STAT, CHEM 8
BUN: 22 mg/dL (ref 6–23)
Calcium, Ion: 1.18 mmol/L (ref 1.13–1.30)
Chloride: 107 mEq/L (ref 96–112)
Creatinine, Ser: 1.2 mg/dL — ABNORMAL HIGH (ref 0.50–1.10)
Glucose, Bld: 129 mg/dL — ABNORMAL HIGH (ref 70–99)
HCT: 35 % — ABNORMAL LOW (ref 36.0–46.0)
Hemoglobin: 11.9 g/dL — ABNORMAL LOW (ref 12.0–15.0)
Potassium: 4.1 mEq/L (ref 3.5–5.1)
Sodium: 142 mEq/L (ref 135–145)
TCO2: 27 mmol/L (ref 0–100)

## 2012-10-19 LAB — POCT I-STAT TROPONIN I: Troponin i, poc: 0.02 ng/mL (ref 0.00–0.08)

## 2012-10-19 MED ORDER — METOPROLOL TARTRATE 1 MG/ML IV SOLN
5.0000 mg | Freq: Once | INTRAVENOUS | Status: DC
  Filled 2012-10-19: qty 5

## 2012-10-19 MED ORDER — SODIUM CHLORIDE 0.9 % IV BOLUS (SEPSIS)
500.0000 mL | Freq: Once | INTRAVENOUS | Status: AC
  Administered 2012-10-19: 500 mL via INTRAVENOUS

## 2012-10-19 MED ORDER — METOPROLOL TARTRATE 1 MG/ML IV SOLN
2.5000 mg | Freq: Once | INTRAVENOUS | Status: AC
  Administered 2012-10-19: 2.5 mg via INTRAVENOUS

## 2012-10-19 MED ORDER — IOHEXOL 350 MG/ML SOLN
100.0000 mL | Freq: Once | INTRAVENOUS | Status: AC | PRN
  Administered 2012-10-19: 100 mL via INTRAVENOUS

## 2012-10-19 NOTE — ED Notes (Signed)
EMS states was call out for pt having palpitations, awaken from sleep. Pt felt "heart racing" per EMS, upon EMS arrival pt s/s was gone and denies any caridac history. Pt denies any chest pain or shortness of breath or n/v/d at this time. Pt c/o "a slight headache" at this time.

## 2012-10-19 NOTE — ED Provider Notes (Signed)
History     CSN: [id number]  Arrival date & time 10/19/12  0354   First MD Initiated Contact with Patient 10/19/12 0401      Chief Complaint  Patient presents with  . Palpitations    (Consider location/radiation/quality/duration/timing/severity/associated sxs/prior treatment) Patient is a 79 y.o. female presenting with palpitations. The history is provided by the patient.  Palpitations Palpitations quality:  Irregular Onset quality:  At rest Timing:  Constant Progression:  Unchanged Chronicity:  Recurrent Context: not stimulant use   Relieved by:  Nothing Worsened by:  Nothing tried Ineffective treatments:  None tried Associated symptoms: no chest pressure, no shortness of breath and no vomiting   Risk factors: hx of thyroid disease     Past Medical History  Diagnosis Date  . Hypertension   . Skin cancer   . Hypothyroidism     Past Surgical History  Procedure Laterality Date  . Mastectomy    . Joint replacement    . Hip surgery    . Replacement total knee    . Abdominal hysterectomy    . Cholecystectomy    . Cataract extraction    . Colonoscopy N/A 10/07/2012    Procedure: COLONOSCOPY;  Surgeon: Willis Modena, MD;  Location: [facility] ENDOSCOPY;  Service: Endoscopy;  Laterality: N/A;    Family History  Problem Relation Age of Onset  . Glaucoma Mother   . Diabetes Sister   . Diabetes Brother   . Coronary artery disease Father     History  Substance Use Topics  . Smoking status: Never Smoker   . Smokeless tobacco: Never Used  . Alcohol Use: No    OB History   Grav Para Term Preterm Abortions TAB SAB Ect Mult Living                  Review of Systems  Respiratory: Negative for shortness of breath.   Cardiovascular: Positive for palpitations. Negative for leg swelling.  Gastrointestinal: Negative for vomiting.  All other systems reviewed and are negative.    Allergies  Codeine; Morphine and related; and Sulfa antibiotics  Home Medications    Current Outpatient Rx  Name  Route  Sig  Dispense  Refill  . Calcium Carbonate-Vit D-Min (CALCIUM 1200 PO)   Oral   Take 1,200 mg by mouth daily.         . hydrocortisone (ANUSOL-HC) 2.5 % rectal cream      Apply rectally 2 times daily   30 g   1   . hydrocortisone (ANUSOL-HC) 25 MG suppository   Rectal   Place 1 suppository (25 mg total) rectally every 12 (twelve) hours.   12 suppository   1   . irbesartan (AVAPRO) 300 MG tablet   Oral   Take 300 mg by mouth every morning.         Marland Kitchen levothyroxine (SYNTHROID, LEVOTHROID) 75 MCG tablet   Oral   Take 75 mcg by mouth daily before breakfast.         . metoprolol (LOPRESSOR) 50 MG tablet   Oral   Take 50 mg by mouth 2 (two) times daily.           BP 172/52  Pulse 51  Temp(Src) 98.3 F (36.8 C) (Oral)  Resp 27  SpO2 96%  Physical Exam  Constitutional: She is oriented to person, place, and time. She appears well-developed and well-nourished. No distress.  HENT:  Head: Normocephalic and atraumatic.  Mouth/Throat: Oropharynx is clear and moist.  Eyes:  Conjunctivae are normal. Pupils are equal, round, and reactive to light.  Neck: Normal range of motion. Neck supple.  Cardiovascular: Regular rhythm.  Tachycardia present.   Pulmonary/Chest: Effort normal and breath sounds normal. She has no wheezes. She has no rales.  Abdominal: Soft. Bowel sounds are normal. There is no tenderness. There is no rebound and no guarding.  Musculoskeletal: Normal range of motion.  Neurological: She is alert and oriented to person, place, and time.  Skin: Skin is warm and dry.  Psychiatric: She has a normal mood and affect.    ED Course  Procedures (including critical care time)  Labs Reviewed  CBC WITH DIFFERENTIAL   Dg Chest 2 View  10/19/2012   *RADIOLOGY REPORT*  Clinical Data: Tachycardia.  Palpitations.  CHEST - 2 VIEW  Comparison: Chest x-ray 07/06/2010.  Findings: Lung volumes are normal.  No acute consolidative  airspace disease.  Mild diffuse interstitial prominence is similar to multiple prior examinations and appears to be chronic in this individual.  No evidence of pulmonary edema.  Heart size is normal. Mediastinal contours are unremarkable.  Atherosclerosis in the thoracic aorta. Bilateral apical pleuroparenchymal thickening, most compatible with chronic scarring, unchanged compared to prior studies.  IMPRESSION: 1.  No radiographic evidence of acute cardiopulmonary disease. 2.  Atherosclerosis. 3.  Additional findings, as above.   Original Report Authenticated By: Trudie Reed, M.D.     No diagnosis found.    MDM  Patient hasn't taken her metoprolol for the last 3 nights because she fell asleep on the couch suspect PACs are related to no doses of metoprolol in several days will restart.  Follow up as outpatient with cardiology    Date: 10/19/2012  Rate: 102  Rhythm: sinus tachycardia  QRS Axis: normal  Intervals: normal  ST/T Wave abnormalities: normal  Conduction Disutrbances:none  Narrative Interpretation: pacs  Old EKG Reviewed: changes noted    [place] [facility] to contact patient with follow up regarding PACs and trace effusion seen on CT      Ravinder Hofland K Nicholle Falzon-Rasch, MD 10/19/12 [phone]

## 2012-10-19 NOTE — ED Notes (Signed)
New EKG given to Dr Nicanor Alcon

## 2012-10-26 ENCOUNTER — Encounter (INDEPENDENT_AMBULATORY_CARE_PROVIDER_SITE_OTHER): Payer: Self-pay | Admitting: General Surgery

## 2012-10-26 ENCOUNTER — Ambulatory Visit (INDEPENDENT_AMBULATORY_CARE_PROVIDER_SITE_OTHER): Payer: Medicare Other | Admitting: General Surgery

## 2012-10-26 VITALS — BP 140/82 | HR 76 | Temp 100.1°F | Resp 15 | Ht 62.0 in | Wt 172.0 lb

## 2012-10-26 DIAGNOSIS — K648 Other hemorrhoids: Secondary | ICD-10-CM | POA: Insufficient documentation

## 2012-10-26 NOTE — Patient Instructions (Signed)
Eat more raw fruits, raw vegetables, and grains. May use a stool softener as needed.  Drink non-caffeinated fluid.

## 2012-10-26 NOTE — Progress Notes (Signed)
Patient ID: Tiffany [name], female   DOB: August 06, 1926, 79 y.o.   MRN: [id number]  Chief Complaint  Patient presents with  . New Evaluation    eval hems    HPI Tiffany [name] is a 79 y.o. female.   HPI  She is referred by Dr. Wynelle Link for evaluation hemorrhoid disease. She was recently in the hospital for lower GI bleed and was felt to be secondary to diverticular disease. She was also noted to have some internal hemorrhoids at that time. She's had some spotting of bright red blood after a couple of bowel movements. She's been using Anusol HC.  Past Medical History  Diagnosis Date  . Hypertension   . Skin cancer   . Hypothyroidism   . Arthritis   . Osteoporosis     Past Surgical History  Procedure Laterality Date  . Mastectomy    . Joint replacement    . Hip surgery    . Replacement total knee    . Abdominal hysterectomy    . Cholecystectomy    . Cataract extraction    . Colonoscopy N/A 10/07/2012    Procedure: COLONOSCOPY;  Surgeon: Willis Modena, MD;  Location: [facility] ENDOSCOPY;  Service: Endoscopy;  Laterality: N/A;  . Hip replacment    . Shin repair      Family History  Problem Relation Age of Onset  . Glaucoma Mother   . Diabetes Sister   . Diabetes Brother   . Coronary artery disease Father     Social History History  Substance Use Topics  . Smoking status: Never Smoker   . Smokeless tobacco: Never Used  . Alcohol Use: No    Allergies  Allergen Reactions  . Codeine Nausea Only  . Morphine And Related Anxiety and Other (See Comments)    Feel very unwell and out there  . Sulfa Antibiotics Hives and Itching    Current Outpatient Prescriptions  Medication Sig Dispense Refill  . Calcium Carbonate-Vit D-Min (CALCIUM 1200 PO) Take 1,200 mg by mouth daily.      . diphenoxylate-atropine (LOMOTIL) 2.5-0.025 MG per tablet Take 1 tablet by mouth as needed for diarrhea or loose stools.      . hydrocortisone (ANUSOL-HC) 2.5 % rectal cream Apply rectally 2 times daily  30  g  1  . hydrocortisone (ANUSOL-HC) 25 MG suppository Place 1 suppository (25 mg total) rectally every 12 (twelve) hours.  12 suppository  1  . irbesartan (AVAPRO) 300 MG tablet Take 300 mg by mouth every morning.      Marland Kitchen levothyroxine (SYNTHROID, LEVOTHROID) 75 MCG tablet Take 75 mcg by mouth daily before breakfast.      . metoprolol (LOPRESSOR) 50 MG tablet Take 50 mg by mouth 2 (two) times daily.      . Multiple Vitamin (MULTIVITAMIN WITH MINERALS) TABS Take 1 tablet by mouth daily.      Marland Kitchen oxyCODONE-acetaminophen (PERCOCET/ROXICET) 5-325 MG per tablet        No current facility-administered medications for this visit.    Review of Systems Review of Systems  Constitutional: Negative.   Gastrointestinal: Positive for anal bleeding. Negative for abdominal pain.  Musculoskeletal: Positive for back pain.    Blood pressure 140/82, pulse 76, temperature 100.1 F (37.8 C), temperature source Temporal, resp. rate 15, height 5\' 2"  (1.575 m), weight 172 lb (78.019 kg).  Physical Exam Physical Exam  Constitutional: No distress.  Elderly, overweight female.  Genitourinary:  Scars present in the anal area. On digital rectal examination sphincter tone  is decreased. No anal fissures.  Anoscopy demonstrates moderate size nonbleeding internal hemorrhoids right anterior and right posterior. Small internal hemorrhoid left lateral.    Data Reviewed Notes in Epic.  Assessment    Internal hemorrhoids with intermittent bleeding. Large GI bleed was due to diverticular disease. We discussed dietary changes versus rubber band ligation. She preferred to try to dietary changes first.     Plan    Dietary instructions were given. If she still has some of the bleeding after bowel movements I told to call back and we could perform the in office rubber band ligation.        Shauni Henner J 10/26/2012, 2:10 PM

## 2012-12-17 ENCOUNTER — Ambulatory Visit (INDEPENDENT_AMBULATORY_CARE_PROVIDER_SITE_OTHER): Payer: Medicare Other | Admitting: Cardiovascular Disease

## 2012-12-17 ENCOUNTER — Encounter: Payer: Self-pay | Admitting: Cardiovascular Disease

## 2012-12-17 VITALS — BP 144/68 | HR 77 | Ht 63.0 in | Wt 175.0 lb

## 2012-12-17 DIAGNOSIS — I10 Essential (primary) hypertension: Secondary | ICD-10-CM

## 2012-12-17 DIAGNOSIS — R002 Palpitations: Secondary | ICD-10-CM

## 2012-12-17 DIAGNOSIS — R011 Cardiac murmur, unspecified: Secondary | ICD-10-CM

## 2012-12-17 NOTE — Assessment & Plan Note (Signed)
Seem benign and improved with lopressor  Chronic  In conjunction with murmur on exam would check echo

## 2012-12-17 NOTE — Patient Instructions (Addendum)
The current medical regimen is effective;  continue present plan and medications.  Your physician has requested that you have an echocardiogram. Echocardiography is a painless test that uses sound waves to create images of your heart. It provides your doctor with information about the size and shape of your heart and how well your heart's chambers and valves are working. This procedure takes approximately one hour. There are no restrictions for this procedure.  Follow up as needed. 

## 2012-12-17 NOTE — Progress Notes (Signed)
Patient ID: Tiffany [name], female   DOB: 04-24-1927, 79 y.o.   MRN: [id number] 79 yo referred by ER for palpitations. Has had them for years.  Had a few days where she fell asleep and forget to take her PM dose of lopressor.  In ER was relatively tachycardic and had PAC;s. No afib noted. Since ER evaluation and compliance with meds she has been fine No further palpitations, dyspne, syncope or chest. Pain.  Denies excess ETOH or stimulants or cafienne last TSH in [facility] 3/12 normal   ROS: Denies fever, malais, weight loss, blurry vision, decreased visual acuity, cough, sputum, SOB, hemoptysis, pleuritic pain, palpitaitons, heartburn, abdominal pain, melena, lower extremity edema, claudication, or rash.  All other systems reviewed and negative   General: Affect appropriate Healthy:  appears stated age HEENT: normal Neck supple with no adenopathy JVP normal no bruits no thyromegaly Lungs clear with no wheezing and good diaphragmatic motion Heart:  S1/S2 2/6 SEM at apex no ,rub, gallop or click PMI normal Abdomen: benighn, BS positve, no tenderness, no AAA no bruit.  No HSM or HJR Distal pulses intact with no bruits No edema Neuro non-focal Skin warm and dry No muscular weakness  Medications Current Outpatient Prescriptions  Medication Sig Dispense Refill  . Calcium Carbonate-Vit D-Min (CALCIUM 1200 PO) Take 1,200 mg by mouth daily.      . diphenoxylate-atropine (LOMOTIL) 2.5-0.025 MG per tablet Take 1 tablet by mouth as needed for diarrhea or loose stools.      . irbesartan (AVAPRO) 300 MG tablet Take 300 mg by mouth every morning.      . IRON PO Take 1 tablet by mouth daily.      Marland Kitchen levothyroxine (SYNTHROID, LEVOTHROID) 75 MCG tablet Take 75 mcg by mouth daily before breakfast.      . metoprolol (LOPRESSOR) 50 MG tablet Take 50 mg by mouth 2 (two) times daily.      . Multiple Vitamin (MULTIVITAMIN WITH MINERALS) TABS Take 1 tablet by mouth daily.      Marland Kitchen oxyCODONE-acetaminophen  (PERCOCET/ROXICET) 5-325 MG per tablet every 4 (four) hours as needed.        No current facility-administered medications for this visit.    Allergies Codeine; Morphine and related; and Sulfa antibiotics  Family History: Family History  Problem Relation Age of Onset  . Glaucoma Mother   . Diabetes Sister   . Diabetes Brother   . Coronary artery disease Father     Social History: History   Social History  . Marital Status: Widowed    Spouse Name: N/A    Number of Children: N/A  . Years of Education: N/A   Occupational History  . Not on file.   Social History Main Topics  . Smoking status: Never Smoker   . Smokeless tobacco: Never Used  . Alcohol Use: No  . Drug Use: No  . Sexual Activity: Not on file   Other Topics Concern  . Not on file   Social History Narrative  . No narrative on file    Electrocardiogram: 6/14  SR no acute changes PAC  Assessment and Plan

## 2012-12-17 NOTE — Assessment & Plan Note (Signed)
Well controlled.  Continue current medications and low sodium Dash type diet.    

## 2012-12-22 ENCOUNTER — Ambulatory Visit (HOSPITAL_COMMUNITY): Payer: Medicare Other | Attending: Cardiology | Admitting: Radiology

## 2012-12-22 DIAGNOSIS — R011 Cardiac murmur, unspecified: Secondary | ICD-10-CM | POA: Insufficient documentation

## 2012-12-22 DIAGNOSIS — R002 Palpitations: Secondary | ICD-10-CM | POA: Insufficient documentation

## 2012-12-22 DIAGNOSIS — I10 Essential (primary) hypertension: Secondary | ICD-10-CM | POA: Insufficient documentation

## 2012-12-22 NOTE — Progress Notes (Signed)
Echocardiogram performed.  

## 2012-12-23 ENCOUNTER — Telehealth: Payer: Self-pay | Admitting: *Deleted

## 2012-12-23 NOTE — Telephone Encounter (Signed)
The patient came in for her echo. She reported she hit her leg on a coffee table. She bleed for a little bit but fluid has continued to leak from her leg. She soaked 2 towels last night and 4 x 4 of guaze today. She is worried about fluid leaking from her leg. [phone]                  SPOKE WITH PT   RE MESSAGE PER PT  LEG IS MUCH BETTER  NO LONGER SEEPING  INSTRUCTED  TO KEEP WATCH WOUND FOR ANY SIGNS OF INFECTION SUCH AS DRAINAGE  PAIN REDNESS OR HOT TO TOUCH  AND  IF ANY S/S  DEVELOP TO CALL PMD  VERBALIZED UNDERSTANDING

## 2013-02-23 ENCOUNTER — Ambulatory Visit (INDEPENDENT_AMBULATORY_CARE_PROVIDER_SITE_OTHER): Payer: Medicare Other | Admitting: General Surgery

## 2013-02-23 ENCOUNTER — Encounter (INDEPENDENT_AMBULATORY_CARE_PROVIDER_SITE_OTHER): Payer: Self-pay | Admitting: General Surgery

## 2013-02-23 VITALS — BP 138/82 | HR 80 | Resp 18 | Ht 62.0 in | Wt 174.0 lb

## 2013-02-23 DIAGNOSIS — K648 Other hemorrhoids: Secondary | ICD-10-CM

## 2013-02-23 NOTE — Patient Instructions (Signed)
Call if you start having heavier bleeding or more frequent bleeding. Avoid constipation.

## 2013-02-23 NOTE — Progress Notes (Signed)
Subjective:     Patient ID: Tiffany [name], female   DOB: 1926/07/04, 79 y.o.   MRN: [id number]  HPI  She is here because she had a small episode of rectal bleeding 2 days ago while urinating. She feels it may be from her hemorrhoids. It is nothing like what she had with the diverticular bleeding. She has had no pain. She states she does not have to strain to have a bowel movement.   Review of Systems   No Constipation. No vaginal bleeding. No hematuria.     Objective:   Physical Exam Gen., elderly female in no acute distress.  Anal rectal-small external swellings. Lichen planus changes in the perianal area. Digital rectal exam demonstrates slightly decreased sphincter tone. No masses.  Anoscopy demonstrates a moderate to large smooth right anterior internal hemorrhoid. Small to moderate sized right posterior internal hemorrhoid. No fissures or masses.    Assessment:     One episode of rectal bleeding that likely was secondary to the internal hemorrhoid. This is not the same type of bleeding she had with her diverticular bleed. We discussed rubber band ligation treatment today but she would like to wait until later in the year.     Plan:     I told her she had a large amount of bleeding that she should go to the emergency department. I told her to call back when she like to be seen again later this year to consider rubber band ligation treatment.

## 2013-04-08 ENCOUNTER — Encounter (INDEPENDENT_AMBULATORY_CARE_PROVIDER_SITE_OTHER): Payer: Self-pay

## 2013-04-08 ENCOUNTER — Encounter (INDEPENDENT_AMBULATORY_CARE_PROVIDER_SITE_OTHER): Payer: Self-pay | Admitting: General Surgery

## 2013-04-08 ENCOUNTER — Ambulatory Visit (INDEPENDENT_AMBULATORY_CARE_PROVIDER_SITE_OTHER): Payer: Medicare Other | Admitting: General Surgery

## 2013-04-08 VITALS — BP 158/82 | HR 84 | Temp 98.6°F | Resp 15 | Ht 62.0 in | Wt 174.2 lb

## 2013-04-08 DIAGNOSIS — K648 Other hemorrhoids: Secondary | ICD-10-CM

## 2013-04-08 NOTE — Patient Instructions (Signed)
We placed a rubber band on the right-sided internal hemorrhoid. You may have some rectal bleeding for the next 3-7 days. If you have a large amount of rectal bleeding, call the office.

## 2013-04-08 NOTE — Progress Notes (Signed)
Subjective:     Patient ID: Tiffany [name], female   DOB: January 29, 1927, 79 y.o.   MRN: [id number]  HPI  She presents having another episode of rectal bleeding which she's feels is from her hemorrhoids. No anal pain. She had her colonoscopy by Dr. Dulce Sellar.   Review of Systems     Objective:   Physical Exam Gen.-she looks well and is in no acute distress.  Anorectal-right anterior external hemorrhoid. There are grade 2 internal hemorrhoids in the right anterior and right posterior positions. Right anterior position more prominent. There are easily reducible. Anoscopy was performed and band ligation was done on the right posterior hemorrhoid. She tolerated this well.    Assessment:     Bleeding internal hemorrhoids status post for band ligation 2 right posterior hemorrhoid.     Plan:     Post procedure instructions were given. Return visit in one month for reexamination.

## 2013-05-12 ENCOUNTER — Encounter (INDEPENDENT_AMBULATORY_CARE_PROVIDER_SITE_OTHER): Payer: Self-pay | Admitting: General Surgery

## 2013-05-12 ENCOUNTER — Ambulatory Visit (INDEPENDENT_AMBULATORY_CARE_PROVIDER_SITE_OTHER): Payer: Medicare Other | Admitting: General Surgery

## 2013-05-12 VITALS — BP 128/80 | HR 68 | Temp 98.0°F | Resp 16 | Ht 62.0 in | Wt 174.2 lb

## 2013-05-12 DIAGNOSIS — K648 Other hemorrhoids: Secondary | ICD-10-CM

## 2013-05-12 NOTE — Progress Notes (Signed)
Subjective:     Patient ID: Tiffany [name], female   DOB: 07/04/1926, 80 y.o.   MRN: [id number]  HPI  She is here for followup of her bleeding internal hemorrhoids. Rubber band ligation was performed of the right posterior hemorrhoid. She has little but of bleeding the first week after the procedure but since that time she has not had any further rectal bleeding.   Review of Systems     Objective:   Physical Exam Generally, she looks well and is in no acute distress.  Anorectal, some external swellings and whitish discoloration present. On digital rectal examination no palpable masses or noted.  Anoscopy demonstrates no residual right posterior hemorrhoid. There is a small to moderate size smooth right anterior internal hemorrhoid that was not bleeding.    Assessment:     Bleeding internal hemorrhoids that have responded to rubber band ligation.     Plan:     Avoid constipation. Return visit if she has recurrent rectal bleeding.

## 2013-05-12 NOTE — Patient Instructions (Signed)
Avoid constipation. Call if you have recurrent bleeding.

## 2013-11-15 ENCOUNTER — Ambulatory Visit (INDEPENDENT_AMBULATORY_CARE_PROVIDER_SITE_OTHER): Payer: Medicare Other | Admitting: General Surgery

## 2013-11-15 ENCOUNTER — Encounter (INDEPENDENT_AMBULATORY_CARE_PROVIDER_SITE_OTHER): Payer: Self-pay | Admitting: General Surgery

## 2013-11-15 VITALS — BP 128/62 | HR 72 | Temp 98.0°F | Resp 18 | Ht 62.0 in | Wt 171.0 lb

## 2013-11-15 DIAGNOSIS — K648 Other hemorrhoids: Secondary | ICD-10-CM

## 2013-11-15 DIAGNOSIS — K625 Hemorrhage of anus and rectum: Secondary | ICD-10-CM

## 2013-11-15 NOTE — Patient Instructions (Signed)
Call if you have heavy bleeding.   

## 2013-11-15 NOTE — Progress Notes (Signed)
Subjective:     Patient ID: Tiffany [name], female   DOB: February 11, 1927, 80 y.o.   MRN: [id number]  HPI  She presents today because she had an episode of rectal bleeding on June 20. She states she had some bright red blood in the few clots. She estimates the amount to be half a cup. It did not accompany a bowel movement. She had a very mild amount of crampy lower abdominal pain prior to this.   Review of Systemsshe's not had any further episodes since that one a few weeks ago.     Objective:   Physical Exam Generally, she looks well and is in no acute distress.  Anorectal, some external swellings and whitish discoloration present which is chronic. There is a moderate-sized prolapsing slightly inflamed left internal hemorrhoid noticed.  On digital rectal examination no palpable masses or noted.  Anoscopy demonstrates no residual right posterior hemorrhoid. There is a small to moderate size smooth right anterior internal hemorrhoid that was not bleeding. On anoscopy, there is a moderate sized slightly inflamed left internal hemorrhoid noted. After discussion with her, rubber band ligation was performed which he tolerated well.    Assessment:     Recurrent rectal bleeding of a very small amount. Does not sound like her previous diverticular bleed which was significant. This indeed could be found the inflamed left lateral internal hemorrhoid which was treated with rubber band ligation.    Plan:     Return visit in 4 weeks. Call or go to the emergency department if she has massive rectal bleeding.

## 2013-12-10 ENCOUNTER — Other Ambulatory Visit: Payer: Self-pay | Admitting: Orthopedic Surgery

## 2013-12-10 DIAGNOSIS — M5136 Other intervertebral disc degeneration, lumbar region: Secondary | ICD-10-CM

## 2013-12-15 ENCOUNTER — Encounter (INDEPENDENT_AMBULATORY_CARE_PROVIDER_SITE_OTHER): Payer: Self-pay | Admitting: General Surgery

## 2013-12-15 ENCOUNTER — Ambulatory Visit (INDEPENDENT_AMBULATORY_CARE_PROVIDER_SITE_OTHER): Payer: Medicare Other | Admitting: General Surgery

## 2013-12-15 VITALS — BP 134/76 | HR 70 | Temp 97.5°F | Ht 62.0 in | Wt 172.0 lb

## 2013-12-15 DIAGNOSIS — K648 Other hemorrhoids: Secondary | ICD-10-CM

## 2013-12-15 NOTE — Patient Instructions (Signed)
Call if you have more bleeding episodes.

## 2013-12-15 NOTE — Progress Notes (Signed)
Subjective:     Patient ID: Tiffany [name], female   DOB: 21-Feb-1927, 80 y.o.   MRN: [id number]  HPI  She is here for followup after her and ligation of a left lateral internal hemorrhoid. She's not had any more bleeding.  Review of Systems     Objective:   Physical Exam Generally, she looks well and is in no acute distress.  Anorectal, some external swellings and whitish discoloration present which is chronic. Small external skin tags.  Anoscopy demonstrates a small right anterior hemorrhoid.    Assessment:     Bleeding internal hemorrhoids that have responded well to rubber band ligation. Currently asymptomatic.   Plan:     Return visit prn.

## 2013-12-22 ENCOUNTER — Other Ambulatory Visit: Payer: Self-pay | Admitting: Orthopedic Surgery

## 2013-12-22 ENCOUNTER — Ambulatory Visit
Admission: RE | Admit: 2013-12-22 | Discharge: 2013-12-22 | Disposition: A | Discharge status: Home / Self Care | Payer: Medicare Other | Source: Ambulatory Visit | Attending: Orthopedic Surgery | Admitting: Orthopedic Surgery

## 2013-12-22 DIAGNOSIS — M5136 Other intervertebral disc degeneration, lumbar region: Secondary | ICD-10-CM

## 2013-12-22 MED ORDER — IOHEXOL 180 MG/ML  SOLN
1.0000 mL | Freq: Once | INTRAMUSCULAR | Status: AC | PRN
  Administered 2013-12-22: 1 mL via EPIDURAL

## 2013-12-22 MED ORDER — METHYLPREDNISOLONE ACETATE 40 MG/ML INJ SUSP (RADIOLOG
120.0000 mg | Freq: Once | INTRAMUSCULAR | Status: AC
  Administered 2013-12-22: 120 mg via EPIDURAL

## 2013-12-22 NOTE — Discharge Instructions (Signed)

## 2013-12-29 ENCOUNTER — Other Ambulatory Visit: Payer: Self-pay | Admitting: Orthopedic Surgery

## 2013-12-29 DIAGNOSIS — M5137 Other intervertebral disc degeneration, lumbosacral region: Secondary | ICD-10-CM

## 2014-01-07 ENCOUNTER — Ambulatory Visit
Admission: RE | Admit: 2014-01-07 | Discharge: 2014-01-07 | Disposition: A | Discharge status: Home / Self Care | Payer: Medicare Other | Source: Ambulatory Visit | Attending: Orthopedic Surgery | Admitting: Orthopedic Surgery

## 2014-01-07 VITALS — BP 178/72 | HR 69

## 2014-01-07 DIAGNOSIS — M5137 Other intervertebral disc degeneration, lumbosacral region: Secondary | ICD-10-CM

## 2014-01-07 MED ORDER — IOHEXOL 180 MG/ML  SOLN
1.0000 mL | Freq: Once | INTRAMUSCULAR | Status: AC | PRN
  Administered 2014-01-07: 1 mL via EPIDURAL

## 2014-01-07 MED ORDER — METHYLPREDNISOLONE ACETATE 40 MG/ML INJ SUSP (RADIOLOG
120.0000 mg | Freq: Once | INTRAMUSCULAR | Status: AC
  Administered 2014-01-07: 120 mg via EPIDURAL

## 2014-02-08 ENCOUNTER — Telehealth (INDEPENDENT_AMBULATORY_CARE_PROVIDER_SITE_OTHER): Payer: Self-pay | Admitting: General Surgery

## 2014-02-08 NOTE — Telephone Encounter (Signed)
She called because she had some diarrhea and lower abdominal discomfort followed by a bloody BM.  She still has some lower abdominal discomfort.  She has a h/o a significant LGI bleed in 2014 felt to be secondary to colonic diverticular disease.  She also has a h/o bleeding hemorrhoids.  I explained to her that bleeding from internal hemorrhoids was typically painless and if she continued to have bloody BMs, she should go to the ED.

## 2014-11-18 ENCOUNTER — Other Ambulatory Visit: Payer: Self-pay | Admitting: Orthopedic Surgery

## 2014-11-18 DIAGNOSIS — M5136 Other intervertebral disc degeneration, lumbar region: Secondary | ICD-10-CM

## 2014-12-02 ENCOUNTER — Ambulatory Visit
Admission: RE | Admit: 2014-12-02 | Discharge: 2014-12-02 | Disposition: A | Discharge status: Home / Self Care | Payer: Medicare Other | Source: Ambulatory Visit | Attending: Orthopedic Surgery | Admitting: Orthopedic Surgery

## 2014-12-02 DIAGNOSIS — M5136 Other intervertebral disc degeneration, lumbar region: Secondary | ICD-10-CM

## 2014-12-02 MED ORDER — METHYLPREDNISOLONE ACETATE 40 MG/ML INJ SUSP (RADIOLOG
120.0000 mg | Freq: Once | INTRAMUSCULAR | Status: AC
  Administered 2014-12-02: 120 mg via EPIDURAL

## 2014-12-02 MED ORDER — IOHEXOL 180 MG/ML  SOLN
1.0000 mL | Freq: Once | INTRAMUSCULAR | Status: AC | PRN
  Administered 2014-12-02: 1 mL via EPIDURAL

## 2014-12-22 ENCOUNTER — Other Ambulatory Visit: Payer: Self-pay | Admitting: Orthopedic Surgery

## 2014-12-22 DIAGNOSIS — M5136 Other intervertebral disc degeneration, lumbar region: Secondary | ICD-10-CM

## 2015-01-11 ENCOUNTER — Ambulatory Visit
Admission: RE | Admit: 2015-01-11 | Discharge: 2015-01-11 | Disposition: A | Discharge status: Home / Self Care | Payer: Medicare Other | Source: Ambulatory Visit | Attending: Orthopedic Surgery | Admitting: Orthopedic Surgery

## 2015-01-11 DIAGNOSIS — M5136 Other intervertebral disc degeneration, lumbar region: Secondary | ICD-10-CM

## 2015-01-11 MED ORDER — IOHEXOL 180 MG/ML  SOLN
1.0000 mL | Freq: Once | INTRAMUSCULAR | Status: DC | PRN
  Administered 2015-01-11: 1 mL via EPIDURAL

## 2015-01-11 MED ORDER — METHYLPREDNISOLONE ACETATE 40 MG/ML INJ SUSP (RADIOLOG
120.0000 mg | Freq: Once | INTRAMUSCULAR | Status: AC
  Administered 2015-01-11: 120 mg via EPIDURAL

## 2015-01-11 NOTE — Discharge Instructions (Signed)

## 2015-02-09 ENCOUNTER — Emergency Department (HOSPITAL_COMMUNITY)
Admission: EM | Admit: 2015-02-09 | Discharge: 2015-02-09 | Disposition: A | Discharge status: Home / Self Care | Payer: Medicare Other | Attending: Emergency Medicine | Admitting: Emergency Medicine

## 2015-02-09 ENCOUNTER — Encounter (HOSPITAL_COMMUNITY): Payer: Self-pay | Admitting: *Deleted

## 2015-02-09 DIAGNOSIS — E039 Hypothyroidism, unspecified: Secondary | ICD-10-CM | POA: Insufficient documentation

## 2015-02-09 DIAGNOSIS — Z86010 Personal history of colonic polyps: Secondary | ICD-10-CM | POA: Insufficient documentation

## 2015-02-09 DIAGNOSIS — E860 Dehydration: Secondary | ICD-10-CM | POA: Insufficient documentation

## 2015-02-09 DIAGNOSIS — I10 Essential (primary) hypertension: Secondary | ICD-10-CM | POA: Diagnosis not present

## 2015-02-09 DIAGNOSIS — M810 Age-related osteoporosis without current pathological fracture: Secondary | ICD-10-CM | POA: Insufficient documentation

## 2015-02-09 DIAGNOSIS — Z853 Personal history of malignant neoplasm of breast: Secondary | ICD-10-CM | POA: Diagnosis not present

## 2015-02-09 DIAGNOSIS — Z79899 Other long term (current) drug therapy: Secondary | ICD-10-CM | POA: Diagnosis not present

## 2015-02-09 DIAGNOSIS — Z85828 Personal history of other malignant neoplasm of skin: Secondary | ICD-10-CM | POA: Insufficient documentation

## 2015-02-09 DIAGNOSIS — R4182 Altered mental status, unspecified: Secondary | ICD-10-CM | POA: Diagnosis present

## 2015-02-09 DIAGNOSIS — R197 Diarrhea, unspecified: Secondary | ICD-10-CM | POA: Diagnosis not present

## 2015-02-09 DIAGNOSIS — Z8719 Personal history of other diseases of the digestive system: Secondary | ICD-10-CM | POA: Insufficient documentation

## 2015-02-09 DIAGNOSIS — M1990 Unspecified osteoarthritis, unspecified site: Secondary | ICD-10-CM | POA: Insufficient documentation

## 2015-02-09 HISTORY — DX: Polyp of colon: K63.5

## 2015-02-09 HISTORY — DX: Malignant neoplasm of unspecified site of unspecified female breast: C50.919

## 2015-02-09 HISTORY — DX: Hemorrhage of anus and rectum: K62.5

## 2015-02-09 LAB — CBC WITH DIFFERENTIAL/PLATELET
Basophils Absolute: 0 10*3/uL (ref 0.0–0.1)
Basophils Relative: 0 %
Eosinophils Absolute: 0 10*3/uL (ref 0.0–0.7)
Eosinophils Relative: 0 %
HCT: 39.6 % (ref 36.0–46.0)
Hemoglobin: 12.9 g/dL (ref 12.0–15.0)
Lymphocytes Relative: 13 %
Lymphs Abs: 1.4 10*3/uL (ref 0.7–4.0)
MCH: 30.6 pg (ref 26.0–34.0)
MCHC: 32.6 g/dL (ref 30.0–36.0)
MCV: 93.8 fL (ref 78.0–100.0)
Monocytes Absolute: 1.1 10*3/uL — ABNORMAL HIGH (ref 0.1–1.0)
Monocytes Relative: 11 %
Neutro Abs: 7.8 10*3/uL — ABNORMAL HIGH (ref 1.7–7.7)
Neutrophils Relative %: 76 %
Platelets: 164 10*3/uL (ref 150–400)
RBC: 4.22 MIL/uL (ref 3.87–5.11)
RDW: 13.4 % (ref 11.5–15.5)
WBC: 10.4 10*3/uL (ref 4.0–10.5)

## 2015-02-09 LAB — COMPREHENSIVE METABOLIC PANEL
ALT: 12 U/L — ABNORMAL LOW (ref 14–54)
AST: 19 U/L (ref 15–41)
Albumin: 3.7 g/dL (ref 3.5–5.0)
Alkaline Phosphatase: 69 U/L (ref 38–126)
Anion gap: 7 (ref 5–15)
BUN: 24 mg/dL — ABNORMAL HIGH (ref 6–20)
CO2: 29 mmol/L (ref 22–32)
Calcium: 9.5 mg/dL (ref 8.9–10.3)
Chloride: 99 mmol/L — ABNORMAL LOW (ref 101–111)
Creatinine, Ser: 1.45 mg/dL — ABNORMAL HIGH (ref 0.44–1.00)
GFR calc Af Amer: 37 mL/min — ABNORMAL LOW (ref >60)
GFR calc non Af Amer: 32 mL/min — ABNORMAL LOW (ref >60)
Glucose, Bld: 150 mg/dL — ABNORMAL HIGH (ref 65–99)
Potassium: 4.1 mmol/L (ref 3.5–5.1)
Sodium: 135 mmol/L (ref 135–145)
Total Bilirubin: 1.1 mg/dL (ref 0.3–1.2)
Total Protein: 6.8 g/dL (ref 6.5–8.1)

## 2015-02-09 LAB — URINE MICROSCOPIC-ADD ON

## 2015-02-09 LAB — URINALYSIS, ROUTINE W REFLEX MICROSCOPIC
Bilirubin Urine: NEGATIVE
Glucose, UA: NEGATIVE mg/dL
Hgb urine dipstick: NEGATIVE
Ketones, ur: NEGATIVE mg/dL
Nitrite: NEGATIVE
Protein, ur: NEGATIVE mg/dL
Specific Gravity, Urine: 1.015 (ref 1.005–1.030)
Urobilinogen, UA: 0.2 mg/dL (ref 0.0–1.0)
pH: 6.5 (ref 5.0–8.0)

## 2015-02-09 NOTE — ED Notes (Signed)
Son states that pt has had diarrhea for the last several days that began on Fri; he reports that it had stopped today; pt is awake and alert in triage; son reports that pt has had decreased intake over the last few days; he states that she has been sleeping more and has been difficult to wake up; son reports that tonight he was talking to pt and she was giggling and not answering questions appropriately; pt smiling and laughing in triage but only states that "I am fine"; son reports that pt had rib pain 2 days ago that she described as crushing pain; pt denies pain currently

## 2015-02-09 NOTE — Discharge Instructions (Signed)
Try to drink extra fluid, especially water, for several days. Eat 3 meals, each day, even if you are not hungry.   Dehydration, Adult Dehydration is a condition in which you do not have enough fluid or water in your body. It happens when you take in less fluid than you lose. Vital organs such as the kidneys, brain, and heart cannot function without a proper amount of fluids. Any loss of fluids from the body can cause dehydration.  Dehydration can range from mild to severe. This condition should be treated right away to help prevent it from becoming severe. CAUSES  This condition may be caused by:  Vomiting.  Diarrhea.  Excessive sweating, such as when exercising in hot or humid weather.  Not drinking enough fluid during strenuous exercise or during an illness.  Excessive urine output.  Fever.  Certain medicines. RISK FACTORS This condition is more likely to develop in:  People who are taking certain medicines that cause the body to lose excess fluid (diuretics).   People who have a chronic illness, such as diabetes, that may increase urination.  Older adults.   People who live at high altitudes.   People who participate in endurance sports.  SYMPTOMS  Mild Dehydration  Thirst.  Dry lips.  Slightly dry mouth.  Dry, warm skin. Moderate Dehydration  Very dry mouth.   Muscle cramps.   Dark urine and decreased urine production.   Decreased tear production.   Headache.   Light-headedness, especially when you stand up from a sitting position.  Severe Dehydration  Changes in skin.   Cold and clammy skin.   Skin does not spring back quickly when lightly pinched and released.   Changes in body fluids.   Extreme thirst.   No tears.   Not able to sweat when body temperature is high, such as in hot weather.   Minimal urine production.   Changes in vital signs.   Rapid, weak pulse (more than 100 beats per minute when you are sitting  still).   Rapid breathing.   Low blood pressure.   Other changes.   Sunken eyes.   Cold hands and feet.   Confusion.  Lethargy and difficulty being awakened.  Fainting (syncope).   Short-term weight loss.   Unconsciousness. DIAGNOSIS  This condition may be diagnosed based on your symptoms. You may also have tests to determine how severe your dehydration is. These tests may include:   Urine tests.   Blood tests.  TREATMENT  Treatment for this condition depends on the severity. Mild or moderate dehydration can often be treated at home. Treatment should be started right away. Do not wait until dehydration becomes severe. Severe dehydration needs to be treated at the hospital. Treatment for Mild Dehydration  Drinking plenty of water to replace the fluid you have lost.   Replacing minerals in your blood (electrolytes) that you may have lost.  Treatment for Moderate Dehydration  Consuming oral rehydration solution (ORS). Treatment for Severe Dehydration  Receiving fluid through an IV tube.   Receiving electrolyte solution through a feeding tube that is passed through your nose and into your stomach (nasogastric tube or NG tube).  Correcting any abnormalities in electrolytes. HOME CARE INSTRUCTIONS   Drink enough fluid to keep your urine clear or pale yellow.   Drink water or fluid slowly by taking small sips. You can also try sucking on ice cubes.  Have food or beverages that contain electrolytes. Examples include bananas and sports drinks.  Take  over-the-counter and prescription medicines only as told by your health care provider.   Prepare ORS according to the manufacturer's instructions. Take sips of ORS every 5 minutes until your urine returns to normal.  If you have vomiting or diarrhea, continue to try to drink water, ORS, or both.   If you have diarrhea, avoid:   Beverages that contain caffeine.   Fruit juice.   Milk.    Carbonated soft drinks.  Do not take salt tablets. This can lead to the condition of having too much sodium in your body (hypernatremia).  SEEK MEDICAL CARE IF:  You cannot eat or drink without vomiting.  You have had moderate diarrhea during a period of more than 24 hours.  You have a fever. SEEK IMMEDIATE MEDICAL CARE IF:   You have extreme thirst.  You have severe diarrhea.  You have not urinated in 6-8 hours, or you have urinated only a small amount of very dark urine.  You have shriveled skin.  You are dizzy, confused, or both.   This information is not intended to replace advice given to you by your health care provider. Make sure you discuss any questions you have with your health care provider.   Document Released: 04/22/2005 Document Revised: 01/11/2015 Document Reviewed: 09/07/2014 Elsevier Interactive Patient Education [name].

## 2015-02-09 NOTE — ED Provider Notes (Signed)
CSN: [id number]     Arrival date & time 02/09/15  0011 History   First MD Initiated Contact with Patient 02/09/15 0133     Chief Complaint  Patient presents with  . Altered Mental Status     (Consider location/radiation/quality/duration/timing/severity/associated sxs/prior Treatment) HPI   Tiffany [name] is a 81 y.o. female who presents for evaluation of diarrhea and altered mental status. Patient was at home today with her son arrived and found her to have decreased responsiveness. Patient had been sleeping on the couch, and was awake when he arrived. She had difficulty explaining to her son how she was feeling. He felt that she was in pain, and when he asked her how bad it was. She said 9/10. She was complaining of pain in her bilateral flanks at that time. She told him that she felt that she was dying. The confusion and discomfort lasted about an hour then each resolved spontaneously. She has had diarrhea for several days, but it resolved yesterday. Her son also had diarrhea. They both recently returned from a trip to [place], visiting relatives. There was no known abnormal food exposure. The patient is at her baseline at this time. No other recent change in urinary habits, ability to ambulate, or other symptoms. Patient denies chest pain, shortness of breath, cough, headache, paresthesia or focal weakness. There are no other known modifying factors.   Past Medical History  Diagnosis Date  . Hypertension   . Skin cancer   . Hypothyroidism   . Arthritis   . Osteoporosis   . Rectal bleeding   . Colon polyps   . Breast cancer [facility])    Past Surgical History  Procedure Laterality Date  . Mastectomy    . Joint replacement    . Hip surgery    . Replacement total knee    . Abdominal hysterectomy    . Cholecystectomy    . Cataract extraction    . Colonoscopy N/A 10/07/2012    Procedure: COLONOSCOPY;  Surgeon: Arta Silence, MD;  Location[facility] ENDOSCOPY;  Service: Endoscopy;  Laterality:  N/A;  . Hip replacment    . Solicitor    . Bilateral total mastectomy with axillary lymph node dissection     Family History  Problem Relation Age of Onset  . Glaucoma Mother   . Diabetes Sister   . Diabetes Brother   . Coronary artery disease Father    Social History  Substance Use Topics  . Smoking status: Never Smoker   . Smokeless tobacco: Never Used  . Alcohol Use: No   OB History    No data available     Review of Systems  All other systems reviewed and are negative.     Allergies  Codeine; Morphine and related; and Sulfa antibiotics  Home Medications   Prior to Admission medications   Medication Sig Start Date End Date Taking? Authorizing Provider  Calcium Carbonate-Vit D-Min (CALCIUM 1200 PO) Take 1,200 mg by mouth 2 (two) times daily.    Yes Historical Provider, MD  furosemide (LASIX) 20 MG tablet Take 20 mg by mouth daily as needed for fluid.   Yes Historical Provider, MD  irbesartan (AVAPRO) 300 MG tablet Take 300 mg by mouth every morning.   Yes Historical Provider, MD  levothyroxine (SYNTHROID, LEVOTHROID) 88 MCG tablet Take 88 mcg by mouth daily before breakfast.   Yes Historical Provider, MD  metoprolol (LOPRESSOR) 50 MG tablet Take 50 mg by mouth 2 (two) times daily.   Yes  Historical Provider, MD  oxyCODONE-acetaminophen (PERCOCET/ROXICET) 5-325 MG per tablet every 4 (four) hours as needed.  10/02/12  Yes Historical Provider, MD   BP 138/43 mmHg  Pulse 69  Temp(Src) 98.1 F (36.7 C) (Oral)  Resp 18  SpO2 95% Physical Exam  Constitutional: She is oriented to person, place, and time. She appears well-developed and well-nourished. No distress.  Elderly, vigorous  HENT:  Head: Normocephalic and atraumatic.  Right Ear: External ear normal.  Left Ear: External ear normal.  Eyes: Conjunctivae and EOM are normal. Pupils are equal, round, and reactive to light.  Neck: Normal range of motion and phonation normal. Neck supple.  Cardiovascular: Normal  rate, regular rhythm and normal heart sounds.   Pulmonary/Chest: Effort normal and breath sounds normal. She exhibits no bony tenderness.  Abdominal: Soft. There is no tenderness.  Musculoskeletal: Normal range of motion.  Neurological: She is alert and oriented to person, place, and time. No cranial nerve deficit or sensory deficit. She exhibits normal muscle tone. Coordination normal.  No dysarthria and aphasia or nystagmus.  Skin: Skin is warm, dry and intact.  Psychiatric: She has a normal mood and affect. Her behavior is normal. Judgment and thought content normal.  Nursing note and vitals reviewed.   ED Course  Procedures (including critical care time)  Medications - No data to display  No data found.    Labs Review Labs Reviewed  COMPREHENSIVE METABOLIC PANEL - Abnormal; Notable for the following:    Chloride 99 (*)    Glucose, Bld 150 (*)    BUN 24 (*)    Creatinine, Ser 1.45 (*)    ALT 12 (*)    GFR calc non Af Amer 32 (*)    GFR calc Af Amer 37 (*)    All other components within normal limits  CBC WITH DIFFERENTIAL/PLATELET - Abnormal; Notable for the following:    Neutro Abs 7.8 (*)    Monocytes Absolute 1.1 (*)    All other components within normal limits  URINALYSIS, ROUTINE W REFLEX MICROSCOPIC (NOT AT [facility]) - Abnormal; Notable for the following:    APPearance CLOUDY (*)    Leukocytes, UA MODERATE (*)    All other components within normal limits  URINE MICROSCOPIC-ADD ON - Abnormal; Notable for the following:    Squamous Epithelial / LPF FEW (*)    All other components within normal limits  URINE CULTURE    Imaging Review No results found. I have personally reviewed and evaluated these images and lab results as part of my medical decision-making.   EKG Interpretation None      MDM   Final diagnoses:  Dehydration    Evaluation is consistent with diarrhea, and mild dehydration. Patient is able to orally hydrate self. Findings discussed with  patient and son, all questions were answered. Doubt serous bacterial infection. Metabolic instability or impending vascular collapse.  Nursing Notes Reviewed/ Care Coordinated Applicable Imaging Reviewed Interpretation of Laboratory Data incorporated into ED treatment  The patient appears reasonably screened and/or stabilized for discharge and I doubt any other medical condition or other [facility] requiring further screening, evaluation, or treatment in the ED at this time prior to discharge.  Plan: Home Medications- usual; Home Treatments- push oral fluids; return here if the recommended treatment, does not improve the symptoms; Recommended follow up- PCP prn    Daleen Bo, MD 02/10/15 [phone]

## 2015-02-10 LAB — URINE CULTURE: Special Requests: NORMAL

## 2016-02-09 ENCOUNTER — Other Ambulatory Visit: Payer: Self-pay | Admitting: Orthopedic Surgery

## 2016-02-09 DIAGNOSIS — M5136 Other intervertebral disc degeneration, lumbar region: Secondary | ICD-10-CM

## 2016-02-27 ENCOUNTER — Inpatient Hospital Stay: Admission: RE | Admit: 2016-02-27 | Payer: Medicare Other | Source: Ambulatory Visit

## 2016-03-18 ENCOUNTER — Ambulatory Visit
Admission: RE | Admit: 2016-03-18 | Discharge: 2016-03-18 | Disposition: A | Discharge status: Home / Self Care | Payer: Medicare Other | Source: Ambulatory Visit | Attending: Orthopedic Surgery | Admitting: Orthopedic Surgery

## 2016-03-18 DIAGNOSIS — M5136 Other intervertebral disc degeneration, lumbar region: Secondary | ICD-10-CM

## 2016-03-18 MED ORDER — IOPAMIDOL (ISOVUE-M 200) INJECTION 41%
1.0000 mL | Freq: Once | INTRAMUSCULAR | Status: AC
  Administered 2016-03-18: 1 mL via EPIDURAL

## 2016-03-18 MED ORDER — METHYLPREDNISOLONE ACETATE 40 MG/ML INJ SUSP (RADIOLOG
120.0000 mg | Freq: Once | INTRAMUSCULAR | Status: AC
  Administered 2016-03-18: 120 mg via EPIDURAL

## 2016-05-21 ENCOUNTER — Emergency Department (HOSPITAL_COMMUNITY): Payer: Medicare HMO

## 2016-05-21 ENCOUNTER — Emergency Department (HOSPITAL_COMMUNITY)
Admission: EM | Admit: 2016-05-21 | Discharge: 2016-05-22 | Disposition: A | Discharge status: Home / Self Care | Payer: Medicare HMO | Attending: Emergency Medicine | Admitting: Emergency Medicine

## 2016-05-21 ENCOUNTER — Encounter (HOSPITAL_COMMUNITY): Payer: Self-pay | Admitting: Emergency Medicine

## 2016-05-21 DIAGNOSIS — N939 Abnormal uterine and vaginal bleeding, unspecified: Secondary | ICD-10-CM

## 2016-05-21 DIAGNOSIS — Z96659 Presence of unspecified artificial knee joint: Secondary | ICD-10-CM | POA: Diagnosis not present

## 2016-05-21 DIAGNOSIS — E039 Hypothyroidism, unspecified: Secondary | ICD-10-CM | POA: Insufficient documentation

## 2016-05-21 DIAGNOSIS — Z85828 Personal history of other malignant neoplasm of skin: Secondary | ICD-10-CM | POA: Insufficient documentation

## 2016-05-21 DIAGNOSIS — Z87891 Personal history of nicotine dependence: Secondary | ICD-10-CM | POA: Diagnosis not present

## 2016-05-21 DIAGNOSIS — R079 Chest pain, unspecified: Secondary | ICD-10-CM | POA: Insufficient documentation

## 2016-05-21 DIAGNOSIS — R0781 Pleurodynia: Secondary | ICD-10-CM | POA: Diagnosis not present

## 2016-05-21 DIAGNOSIS — I10 Essential (primary) hypertension: Secondary | ICD-10-CM | POA: Diagnosis not present

## 2016-05-21 DIAGNOSIS — Z96649 Presence of unspecified artificial hip joint: Secondary | ICD-10-CM | POA: Insufficient documentation

## 2016-05-21 DIAGNOSIS — Z853 Personal history of malignant neoplasm of breast: Secondary | ICD-10-CM | POA: Diagnosis not present

## 2016-05-21 DIAGNOSIS — R0789 Other chest pain: Secondary | ICD-10-CM | POA: Diagnosis not present

## 2016-05-21 LAB — BASIC METABOLIC PANEL
Anion gap: 7 (ref 5–15)
BUN: 31 mg/dL — ABNORMAL HIGH (ref 6–20)
CO2: 25 mmol/L (ref 22–32)
Calcium: 9.1 mg/dL (ref 8.9–10.3)
Chloride: 105 mmol/L (ref 101–111)
Creatinine, Ser: 1.07 mg/dL — ABNORMAL HIGH (ref 0.44–1.00)
GFR calc Af Amer: 53 mL/min — ABNORMAL LOW (ref >60)
GFR calc non Af Amer: 45 mL/min — ABNORMAL LOW (ref >60)
Glucose, Bld: 182 mg/dL — ABNORMAL HIGH (ref 65–99)
Potassium: 4.2 mmol/L (ref 3.5–5.1)
Sodium: 137 mmol/L (ref 135–145)

## 2016-05-21 LAB — CBC WITH DIFFERENTIAL/PLATELET
Basophils Absolute: 0 10*3/uL (ref 0.0–0.1)
Basophils Relative: 0 %
Eosinophils Absolute: 0 10*3/uL (ref 0.0–0.7)
Eosinophils Relative: 0 %
HCT: 39.5 % (ref 36.0–46.0)
Hemoglobin: 13.2 g/dL (ref 12.0–15.0)
Lymphocytes Relative: 11 %
Lymphs Abs: 1.1 10*3/uL (ref 0.7–4.0)
MCH: 30.1 pg (ref 26.0–34.0)
MCHC: 33.4 g/dL (ref 30.0–36.0)
MCV: 90 fL (ref 78.0–100.0)
Monocytes Absolute: 0.9 10*3/uL (ref 0.1–1.0)
Monocytes Relative: 9 %
Neutro Abs: 8.1 10*3/uL — ABNORMAL HIGH (ref 1.7–7.7)
Neutrophils Relative %: 80 %
Platelets: ADEQUATE 10*3/uL (ref 150–400)
RBC: 4.39 MIL/uL (ref 3.87–5.11)
RDW: 13.5 % (ref 11.5–15.5)
Smear Review: ADEQUATE
WBC: 10.1 10*3/uL (ref 4.0–10.5)

## 2016-05-21 MED ORDER — DICLOFENAC SODIUM 1 % TD GEL: 2.0000 g | Freq: Four times a day (QID) | TRANSDERMAL | 0 refills | Status: DC

## 2016-05-21 NOTE — ED Provider Notes (Signed)
Tiffany [name] Provider Note   CSN: [id number] Arrival date & time: 05/21/16  1928     History   Chief Complaint Chief Complaint  Patient presents with  . Vaginal Bleeding    HPI Tiffany [name] is a 83 y.o. female.  Patient is a pleasant 83 year old female presenting today with 2 separate complaints. First reason for coming is for vaginal bleeding that started yesterday.  Patient states that she had some minimal spotting last night but today has had more vaginal bleeding. She denies any abdominal pain, urinary symptoms and denies any rectal bleeding. Patient states the bleeding is not as heavy as a period but she does have to wear a pad. She has never had vaginal bleeding before. Secondly for the last 3 days patient has had severe sharp 10 out of 10 intermittent right rib pain. It is made worse by deep breathing, lifting her arms or her central right help her stand up. She cannot recall any trauma to the area but she does recall 2 months ago falling and hitting that area. She states when the pain is severe it's hard for her to catch her breath. She has been taking oxycodone which does help with the pain but she was concerned because she does not know why she has it. She denies any URI symptoms, fever, productive cough and as long as the pain is not present she is not short of breath.   The history is provided by the patient and a relative.    Past Medical History:  Diagnosis Date  . Arthritis   . Breast cancer ([place])   . Colon polyps   . Hypertension   . Hypothyroidism   . Osteoporosis   . Rectal bleeding   . Skin cancer     Patient Active Problem List   Diagnosis Date Noted  . Palpitations 12/17/2012  . Internal bleeding hemorrhoids 10/26/2012  . Rectal bleeding 10/04/2012  . HTN (hypertension) 10/04/2012    Past Surgical History:  Procedure Laterality Date  . ABDOMINAL HYSTERECTOMY    . BILATERAL TOTAL MASTECTOMY WITH AXILLARY LYMPH NODE DISSECTION    .  CATARACT EXTRACTION    . CHOLECYSTECTOMY    . COLONOSCOPY N/A 10/07/2012   Procedure: COLONOSCOPY;  Surgeon: Arta Silence, MD;  Location: [facility] ENDOSCOPY;  Service: Endoscopy;  Laterality: N/A;  . hip replacment    . HIP SURGERY    . JOINT REPLACEMENT    . MASTECTOMY    . REPLACEMENT TOTAL KNEE    . shin repair      OB History    No data available       Home Medications    Prior to Admission medications   Medication Sig Start Date End Date Taking? Authorizing Provider  Calcium Carbonate-Vit D-Min (CALCIUM 1200 PO) Take 1,200 mg by mouth 2 (two) times daily.     Historical Provider, MD  furosemide (LASIX) 20 MG tablet Take 20 mg by mouth daily as needed for fluid.    Historical Provider, MD  irbesartan (AVAPRO) 300 MG tablet Take 300 mg by mouth every morning.    Historical Provider, MD  levothyroxine (SYNTHROID, LEVOTHROID) 88 MCG tablet Take 88 mcg by mouth daily before breakfast.    Historical Provider, MD  metoprolol (LOPRESSOR) 50 MG tablet Take 50 mg by mouth 2 (two) times daily.    Historical Provider, MD  oxyCODONE-acetaminophen (PERCOCET/ROXICET) 5-325 MG per tablet every 4 (four) hours as needed.  10/02/12   Historical Provider, MD  Family History Family History  Problem Relation Age of Onset  . Glaucoma Mother   . Coronary artery disease Father   . Diabetes Sister   . Diabetes Brother     Social History Social History  Substance Use Topics  . Smoking status: Never Smoker  . Smokeless tobacco: Never Used  . Alcohol use No     Allergies   Codeine; Morphine and related; and Sulfa antibiotics   Review of Systems Review of Systems  All other systems reviewed and are negative.    Physical Exam Updated Vital Signs BP 183/67 (BP Location: Right Arm)   Pulse 90   Temp 99 F (37.2 C) (Oral)   Resp 18   SpO2 97%   Physical Exam  Constitutional: She is oriented to person, place, and time. She appears well-developed and well-nourished. No distress.    HENT:  Head: Normocephalic and atraumatic.  Mouth/Throat: Oropharynx is clear and moist.  Eyes: Conjunctivae and EOM are normal. Pupils are equal, round, and reactive to light.  Neck: Normal range of motion. Neck supple.  Cardiovascular: Normal rate, regular rhythm and intact distal pulses.   No murmur heard. Pulmonary/Chest: Effort normal and breath sounds normal. No respiratory distress. She has no wheezes. She has no rales. She exhibits tenderness.  Bilateral mastectomies. Mild candidal infection along mastectomy scar.  Pain reproducible with palpation of the lower lateral right ribs. No crepitus noted  Abdominal: Soft. She exhibits no distension. There is no tenderness. There is no rebound and no guarding.  Musculoskeletal: Normal range of motion. She exhibits no edema or tenderness.  Neurological: She is alert and oriented to person, place, and time.  Skin: Skin is warm and dry. No rash noted. No erythema.  Psychiatric: She has a normal mood and affect. Her behavior is normal.  Nursing note and vitals reviewed.    ED Treatments / Results  Labs (all labs ordered are listed, but only abnormal results are displayed) Labs Reviewed  BASIC METABOLIC PANEL - Abnormal; Notable for the following:       Result Value   Glucose, Bld 182 (*)    BUN 31 (*)    Creatinine, Ser 1.07 (*)    GFR calc non Af Amer 45 (*)    GFR calc Af Amer 53 (*)    All other components within normal limits  CBC WITH DIFFERENTIAL/PLATELET - Abnormal; Notable for the following:    Neutro Abs 8.1 (*)    All other components within normal limits  CBC    EKG  EKG Interpretation None       Radiology Dg Ribs Unilateral W/chest Right  Result Date: 05/21/2016 CLINICAL DATA:  Right lateral rib pain onset a few days ago after injury a few months ago. EXAM: RIGHT RIBS AND CHEST - 3+ VIEW COMPARISON:  None. FINDINGS: No acute displaced fracture or other bone lesions are seen involving the ribs. There is no  evidence of pneumothorax or pleural effusion. Minimal subpleural atelectasis and/or scarring along the periphery of the right lung. Heart size and mediastinal contours are within normal limits. There is aortic atherosclerosis. Right upper quadrant abdominal cholecystectomy clips. On one view, the posterior right ninth rib demonstrates a linear lucency believed to be a Mach line due to overlap of adjacent ribs and vasculature. This finding can't be verified on the additional images and therefore believed to be pseudofracture. IMPRESSION: No definite right rib fracture identified. Electronically Signed   By: Ashley Royalty M.D.   On: 05/21/2016  23:34    Procedures Procedures (including critical care time)  Medications Ordered in ED Medications - No data to display   Initial Impression / Assessment and Plan / ED Course  I have reviewed the triage vital signs and the nursing notes.  Pertinent labs & imaging results that were available during my care of the patient were reviewed by me and considered in my medical decision making (see chart for details).  Clinical Course    A she is an 83 year old female presenting today with new onset vaginal bleeding which on exam is consistent with vaginal bleeding. Patient denies any abdominal pain or urinary symptoms. Discussed with the patient following up with Dr. Valentino Saxon with GYN for further evaluation for endometrial hyperplasia and treatment. Currently patient is here for severe pain in her right ribs that started 3 days ago. It is pleuritic and seems to be worse with certain movements.  She cannot recall any trauma but she is point tender along the ribs. There is no evidence of shingles and she has no abdominal pain concerning for right upper quadrant pathology. She denies any URI symptoms suggestive of pneumonia. Low suspicion for PE. Low suspicion for cardiac pathology. Seems to be musculoskeletal in nature. Will do a chest x-ray to rule out rib  fracture.  11:53 PM CBC and cxr without acute findings.  Will have pt f/u with gyn and given voltaren gel for her msk chest pain.  Final Clinical Impressions(s) / ED Diagnoses   Final diagnoses:  Abnormal uterine bleeding  Musculoskeletal chest pain    New Prescriptions New Prescriptions   DICLOFENAC SODIUM (VOLTAREN) 1 % GEL    Apply 2 g topically 4 (four) times daily.     Blanchie Dessert, MD 05/21/16 [phone]

## 2016-05-21 NOTE — ED Triage Notes (Signed)
Pt reports vaginal bleeding that began earlier today; denies new injury or trauma; son states he is not sure if clots are present; states bleeding does not saturate through pt's brief; pt has been sleeping much more this past month and will nod off spontaneously; pt reports shooting pain to upper right back a few days ago

## 2016-07-02 DIAGNOSIS — Z124 Encounter for screening for malignant neoplasm of cervix: Secondary | ICD-10-CM | POA: Diagnosis not present

## 2016-07-02 DIAGNOSIS — Z01419 Encounter for gynecological examination (general) (routine) without abnormal findings: Secondary | ICD-10-CM | POA: Diagnosis not present

## 2016-07-02 DIAGNOSIS — N950 Postmenopausal bleeding: Secondary | ICD-10-CM | POA: Diagnosis not present

## 2016-07-02 DIAGNOSIS — N898 Other specified noninflammatory disorders of vagina: Secondary | ICD-10-CM | POA: Diagnosis not present

## 2016-07-12 DIAGNOSIS — N183 Chronic kidney disease, stage 3 (moderate): Secondary | ICD-10-CM | POA: Diagnosis not present

## 2016-07-12 DIAGNOSIS — E039 Hypothyroidism, unspecified: Secondary | ICD-10-CM | POA: Diagnosis not present

## 2016-07-12 DIAGNOSIS — I129 Hypertensive chronic kidney disease with stage 1 through stage 4 chronic kidney disease, or unspecified chronic kidney disease: Secondary | ICD-10-CM | POA: Diagnosis not present

## 2016-07-12 DIAGNOSIS — R600 Localized edema: Secondary | ICD-10-CM | POA: Diagnosis not present

## 2016-11-26 DIAGNOSIS — R600 Localized edema: Secondary | ICD-10-CM | POA: Diagnosis not present

## 2016-12-17 DIAGNOSIS — D229 Melanocytic nevi, unspecified: Secondary | ICD-10-CM | POA: Diagnosis not present

## 2016-12-17 DIAGNOSIS — L821 Other seborrheic keratosis: Secondary | ICD-10-CM | POA: Diagnosis not present

## 2016-12-17 DIAGNOSIS — L570 Actinic keratosis: Secondary | ICD-10-CM | POA: Diagnosis not present

## 2017-02-17 DIAGNOSIS — N183 Chronic kidney disease, stage 3 (moderate): Secondary | ICD-10-CM | POA: Diagnosis not present

## 2017-02-17 DIAGNOSIS — R600 Localized edema: Secondary | ICD-10-CM | POA: Diagnosis not present

## 2017-02-17 DIAGNOSIS — Z1389 Encounter for screening for other disorder: Secondary | ICD-10-CM | POA: Diagnosis not present

## 2017-02-17 DIAGNOSIS — E039 Hypothyroidism, unspecified: Secondary | ICD-10-CM | POA: Diagnosis not present

## 2017-02-17 DIAGNOSIS — Z0000 Encounter for general adult medical examination without abnormal findings: Secondary | ICD-10-CM | POA: Diagnosis not present

## 2017-02-17 DIAGNOSIS — M810 Age-related osteoporosis without current pathological fracture: Secondary | ICD-10-CM | POA: Diagnosis not present

## 2017-02-17 DIAGNOSIS — I129 Hypertensive chronic kidney disease with stage 1 through stage 4 chronic kidney disease, or unspecified chronic kidney disease: Secondary | ICD-10-CM | POA: Diagnosis not present

## 2017-05-27 DIAGNOSIS — K580 Irritable bowel syndrome with diarrhea: Secondary | ICD-10-CM | POA: Diagnosis not present

## 2017-05-27 DIAGNOSIS — K642 Third degree hemorrhoids: Secondary | ICD-10-CM | POA: Diagnosis not present

## 2017-05-27 DIAGNOSIS — K641 Second degree hemorrhoids: Secondary | ICD-10-CM | POA: Diagnosis not present

## 2017-07-03 DIAGNOSIS — Z96642 Presence of left artificial hip joint: Secondary | ICD-10-CM | POA: Diagnosis not present

## 2017-07-03 DIAGNOSIS — Z96651 Presence of right artificial knee joint: Secondary | ICD-10-CM | POA: Diagnosis not present

## 2017-07-03 DIAGNOSIS — M1712 Unilateral primary osteoarthritis, left knee: Secondary | ICD-10-CM | POA: Diagnosis not present

## 2017-07-03 DIAGNOSIS — M1711 Unilateral primary osteoarthritis, right knee: Secondary | ICD-10-CM | POA: Diagnosis not present

## 2017-07-03 DIAGNOSIS — Z471 Aftercare following joint replacement surgery: Secondary | ICD-10-CM | POA: Diagnosis not present

## 2017-07-03 DIAGNOSIS — Z96653 Presence of artificial knee joint, bilateral: Secondary | ICD-10-CM | POA: Diagnosis not present

## 2017-07-03 DIAGNOSIS — M1612 Unilateral primary osteoarthritis, left hip: Secondary | ICD-10-CM | POA: Diagnosis not present

## 2017-08-18 DIAGNOSIS — R600 Localized edema: Secondary | ICD-10-CM | POA: Diagnosis not present

## 2017-08-18 DIAGNOSIS — E039 Hypothyroidism, unspecified: Secondary | ICD-10-CM | POA: Diagnosis not present

## 2017-08-18 DIAGNOSIS — N183 Chronic kidney disease, stage 3 (moderate): Secondary | ICD-10-CM | POA: Diagnosis not present

## 2017-08-18 DIAGNOSIS — I129 Hypertensive chronic kidney disease with stage 1 through stage 4 chronic kidney disease, or unspecified chronic kidney disease: Secondary | ICD-10-CM | POA: Diagnosis not present

## 2017-12-08 ENCOUNTER — Ambulatory Visit: Payer: Self-pay | Admitting: Surgery

## 2017-12-08 DIAGNOSIS — K641 Second degree hemorrhoids: Secondary | ICD-10-CM | POA: Diagnosis not present

## 2017-12-08 DIAGNOSIS — K580 Irritable bowel syndrome with diarrhea: Secondary | ICD-10-CM | POA: Diagnosis not present

## 2017-12-08 DIAGNOSIS — K458 Other specified abdominal hernia without obstruction or gangrene: Secondary | ICD-10-CM | POA: Diagnosis not present

## 2017-12-15 ENCOUNTER — Other Ambulatory Visit: Payer: Self-pay | Admitting: Surgery

## 2017-12-15 DIAGNOSIS — R109 Unspecified abdominal pain: Secondary | ICD-10-CM

## 2017-12-21 ENCOUNTER — Emergency Department (HOSPITAL_COMMUNITY): Payer: Medicare HMO

## 2017-12-21 ENCOUNTER — Inpatient Hospital Stay (HOSPITAL_COMMUNITY)
Admission: EM | Admit: 2017-12-21 | Discharge: 2017-12-26 | DRG: 066 | Disposition: A | Discharge status: Skilled Nursing Facility | Payer: Medicare HMO | Attending: Internal Medicine | Admitting: Internal Medicine

## 2017-12-21 ENCOUNTER — Other Ambulatory Visit: Payer: Self-pay

## 2017-12-21 ENCOUNTER — Encounter (HOSPITAL_COMMUNITY): Payer: Self-pay | Admitting: *Deleted

## 2017-12-21 DIAGNOSIS — W19XXXA Unspecified fall, initial encounter: Secondary | ICD-10-CM | POA: Diagnosis not present

## 2017-12-21 DIAGNOSIS — R531 Weakness: Secondary | ICD-10-CM

## 2017-12-21 DIAGNOSIS — Z66 Do not resuscitate: Secondary | ICD-10-CM | POA: Diagnosis present

## 2017-12-21 DIAGNOSIS — I63411 Cerebral infarction due to embolism of right middle cerebral artery: Principal | ICD-10-CM | POA: Diagnosis present

## 2017-12-21 DIAGNOSIS — M25562 Pain in left knee: Secondary | ICD-10-CM | POA: Diagnosis not present

## 2017-12-21 DIAGNOSIS — Z9849 Cataract extraction status, unspecified eye: Secondary | ICD-10-CM

## 2017-12-21 DIAGNOSIS — Z9049 Acquired absence of other specified parts of digestive tract: Secondary | ICD-10-CM

## 2017-12-21 DIAGNOSIS — Z9013 Acquired absence of bilateral breasts and nipples: Secondary | ICD-10-CM

## 2017-12-21 DIAGNOSIS — M549 Dorsalgia, unspecified: Secondary | ICD-10-CM | POA: Diagnosis not present

## 2017-12-21 DIAGNOSIS — R296 Repeated falls: Secondary | ICD-10-CM | POA: Diagnosis present

## 2017-12-21 DIAGNOSIS — I6340 Cerebral infarction due to embolism of unspecified cerebral artery: Secondary | ICD-10-CM

## 2017-12-21 DIAGNOSIS — L89151 Pressure ulcer of sacral region, stage 1: Secondary | ICD-10-CM | POA: Clinically undetermined

## 2017-12-21 DIAGNOSIS — R29701 NIHSS score 1: Secondary | ICD-10-CM | POA: Diagnosis present

## 2017-12-21 DIAGNOSIS — E039 Hypothyroidism, unspecified: Secondary | ICD-10-CM | POA: Diagnosis present

## 2017-12-21 DIAGNOSIS — Y92009 Unspecified place in unspecified non-institutional (private) residence as the place of occurrence of the external cause: Secondary | ICD-10-CM

## 2017-12-21 DIAGNOSIS — I6310 Cerebral infarction due to embolism of unspecified precerebral artery: Secondary | ICD-10-CM

## 2017-12-21 DIAGNOSIS — S299XXA Unspecified injury of thorax, initial encounter: Secondary | ICD-10-CM | POA: Diagnosis not present

## 2017-12-21 DIAGNOSIS — S79911A Unspecified injury of right hip, initial encounter: Secondary | ICD-10-CM | POA: Diagnosis not present

## 2017-12-21 DIAGNOSIS — Z79890 Hormone replacement therapy (postmenopausal): Secondary | ICD-10-CM

## 2017-12-21 DIAGNOSIS — R410 Disorientation, unspecified: Secondary | ICD-10-CM | POA: Diagnosis not present

## 2017-12-21 DIAGNOSIS — M810 Age-related osteoporosis without current pathological fracture: Secondary | ICD-10-CM | POA: Diagnosis present

## 2017-12-21 DIAGNOSIS — M25561 Pain in right knee: Secondary | ICD-10-CM | POA: Diagnosis not present

## 2017-12-21 DIAGNOSIS — M6282 Rhabdomyolysis: Secondary | ICD-10-CM

## 2017-12-21 DIAGNOSIS — W07XXXA Fall from chair, initial encounter: Secondary | ICD-10-CM | POA: Diagnosis present

## 2017-12-21 DIAGNOSIS — Z8249 Family history of ischemic heart disease and other diseases of the circulatory system: Secondary | ICD-10-CM

## 2017-12-21 DIAGNOSIS — Z853 Personal history of malignant neoplasm of breast: Secondary | ICD-10-CM

## 2017-12-21 DIAGNOSIS — N183 Chronic kidney disease, stage 3 unspecified: Secondary | ICD-10-CM

## 2017-12-21 DIAGNOSIS — S8992XA Unspecified injury of left lower leg, initial encounter: Secondary | ICD-10-CM | POA: Diagnosis not present

## 2017-12-21 DIAGNOSIS — T796XXA Traumatic ischemia of muscle, initial encounter: Secondary | ICD-10-CM | POA: Diagnosis not present

## 2017-12-21 DIAGNOSIS — I639 Cerebral infarction, unspecified: Secondary | ICD-10-CM | POA: Diagnosis not present

## 2017-12-21 DIAGNOSIS — Z885 Allergy status to narcotic agent status: Secondary | ICD-10-CM

## 2017-12-21 DIAGNOSIS — S32030A Wedge compression fracture of third lumbar vertebra, initial encounter for closed fracture: Secondary | ICD-10-CM | POA: Diagnosis not present

## 2017-12-21 DIAGNOSIS — I129 Hypertensive chronic kidney disease with stage 1 through stage 4 chronic kidney disease, or unspecified chronic kidney disease: Secondary | ICD-10-CM | POA: Diagnosis present

## 2017-12-21 DIAGNOSIS — Z96649 Presence of unspecified artificial hip joint: Secondary | ICD-10-CM | POA: Diagnosis present

## 2017-12-21 DIAGNOSIS — I361 Nonrheumatic tricuspid (valve) insufficiency: Secondary | ICD-10-CM | POA: Diagnosis not present

## 2017-12-21 DIAGNOSIS — I10 Essential (primary) hypertension: Secondary | ICD-10-CM | POA: Diagnosis not present

## 2017-12-21 DIAGNOSIS — W19XXXS Unspecified fall, sequela: Secondary | ICD-10-CM | POA: Diagnosis not present

## 2017-12-21 DIAGNOSIS — Z85828 Personal history of other malignant neoplasm of skin: Secondary | ICD-10-CM

## 2017-12-21 DIAGNOSIS — S79912A Unspecified injury of left hip, initial encounter: Secondary | ICD-10-CM | POA: Diagnosis not present

## 2017-12-21 DIAGNOSIS — S8991XA Unspecified injury of right lower leg, initial encounter: Secondary | ICD-10-CM | POA: Diagnosis not present

## 2017-12-21 DIAGNOSIS — Z882 Allergy status to sulfonamides status: Secondary | ICD-10-CM

## 2017-12-21 DIAGNOSIS — R29810 Facial weakness: Secondary | ICD-10-CM | POA: Diagnosis present

## 2017-12-21 DIAGNOSIS — Z96659 Presence of unspecified artificial knee joint: Secondary | ICD-10-CM | POA: Diagnosis present

## 2017-12-21 DIAGNOSIS — L8990 Pressure ulcer of unspecified site, unspecified stage: Secondary | ICD-10-CM

## 2017-12-21 DIAGNOSIS — D696 Thrombocytopenia, unspecified: Secondary | ICD-10-CM | POA: Diagnosis present

## 2017-12-21 DIAGNOSIS — Z79899 Other long term (current) drug therapy: Secondary | ICD-10-CM

## 2017-12-21 DIAGNOSIS — Z90710 Acquired absence of both cervix and uterus: Secondary | ICD-10-CM

## 2017-12-21 HISTORY — DX: Rhabdomyolysis: M62.82

## 2017-12-21 LAB — CBC
HCT: 43.8 % (ref 36.0–46.0)
Hemoglobin: 14.6 g/dL (ref 12.0–15.0)
MCH: 30.4 pg (ref 26.0–34.0)
MCHC: 33.3 g/dL (ref 30.0–36.0)
MCV: 91.1 fL (ref 78.0–100.0)
Platelets: 171 10*3/uL (ref 150–400)
RBC: 4.81 MIL/uL (ref 3.87–5.11)
RDW: 13 % (ref 11.5–15.5)
WBC: 12.4 10*3/uL — ABNORMAL HIGH (ref 4.0–10.5)

## 2017-12-21 LAB — BASIC METABOLIC PANEL
Anion gap: 7 (ref 5–15)
BUN: 31 mg/dL — ABNORMAL HIGH (ref 8–23)
CO2: 27 mmol/L (ref 22–32)
Calcium: 9.3 mg/dL (ref 8.9–10.3)
Chloride: 111 mmol/L (ref 98–111)
Creatinine, Ser: 1.04 mg/dL — ABNORMAL HIGH (ref 0.44–1.00)
GFR calc Af Amer: 54 mL/min — ABNORMAL LOW (ref >60)
GFR calc non Af Amer: 47 mL/min — ABNORMAL LOW (ref >60)
Glucose, Bld: 156 mg/dL — ABNORMAL HIGH (ref 70–99)
Potassium: 3.9 mmol/L (ref 3.5–5.1)
Sodium: 145 mmol/L (ref 135–145)

## 2017-12-21 LAB — CK: Total CK: 3063 U/L — ABNORMAL HIGH (ref 38–234)

## 2017-12-21 MED ORDER — SODIUM CHLORIDE 0.9 % IV SOLN
1000.0000 mL | INTRAVENOUS | Status: DC
  Administered 2017-12-21: 1000 mL via INTRAVENOUS

## 2017-12-21 MED ORDER — METOPROLOL TARTRATE 50 MG PO TABS
50.0000 mg | ORAL_TABLET | Freq: Two times a day (BID) | ORAL | Status: DC
  Administered 2017-12-21 – 2017-12-22 (×2): 50 mg via ORAL
  Filled 2017-12-21 (×2): qty 1

## 2017-12-21 MED ORDER — ONDANSETRON HCL 4 MG PO TABS: 4.0000 mg | ORAL_TABLET | Freq: Four times a day (QID) | ORAL | Status: DC | PRN

## 2017-12-21 MED ORDER — IRBESARTAN 300 MG PO TABS
300.0000 mg | ORAL_TABLET | Freq: Every morning | ORAL | Status: DC
  Administered 2017-12-22: 300 mg via ORAL
  Filled 2017-12-21: qty 1

## 2017-12-21 MED ORDER — ENOXAPARIN SODIUM 40 MG/0.4ML ~~LOC~~ SOLN
40.0000 mg | SUBCUTANEOUS | Status: DC
  Administered 2017-12-21 – 2017-12-25 (×5): 40 mg via SUBCUTANEOUS
  Filled 2017-12-21 (×5): qty 0.4

## 2017-12-21 MED ORDER — LEVOTHYROXINE SODIUM 88 MCG PO TABS
88.0000 ug | ORAL_TABLET | Freq: Every day | ORAL | Status: DC
Start: 2017-12-22 — End: 2017-12-26
  Administered 2017-12-22 – 2017-12-26 (×5): 88 ug via ORAL
  Filled 2017-12-21 (×5): qty 1

## 2017-12-21 MED ORDER — HYDROCODONE-ACETAMINOPHEN 5-325 MG PO TABS
1.0000 | ORAL_TABLET | ORAL | Status: DC | PRN
  Administered 2017-12-21 – 2017-12-26 (×9): 1 via ORAL
  Filled 2017-12-21 (×3): qty 1
  Filled 2017-12-21: qty 2
  Filled 2017-12-21 (×5): qty 1

## 2017-12-21 MED ORDER — ENSURE ENLIVE PO LIQD
237.0000 mL | Freq: Two times a day (BID) | ORAL | Status: DC
  Administered 2017-12-22 – 2017-12-26 (×8): 237 mL via ORAL

## 2017-12-21 MED ORDER — ONDANSETRON HCL 4 MG/2ML IJ SOLN: 4.0000 mg | Freq: Four times a day (QID) | INTRAMUSCULAR | Status: DC | PRN

## 2017-12-21 MED ORDER — SODIUM CHLORIDE 0.9 % IV SOLN
INTRAVENOUS | Status: DC
  Administered 2017-12-21: 1000 mL via INTRAVENOUS
  Administered 2017-12-22 – 2017-12-23 (×3): via INTRAVENOUS

## 2017-12-21 MED ORDER — SODIUM CHLORIDE 0.9 % IV BOLUS (SEPSIS)
500.0000 mL | Freq: Once | INTRAVENOUS | Status: AC
  Administered 2017-12-21: 500 mL via INTRAVENOUS

## 2017-12-21 MED ORDER — ACETAMINOPHEN 650 MG RE SUPP
650.0000 mg | Freq: Four times a day (QID) | RECTAL | Status: DC | PRN
Start: 2017-12-21 — End: 2017-12-26

## 2017-12-21 MED ORDER — ACETAMINOPHEN 325 MG PO TABS: 650.0000 mg | ORAL_TABLET | Freq: Four times a day (QID) | ORAL | Status: DC | PRN

## 2017-12-21 MED ORDER — FENTANYL CITRATE (PF) 100 MCG/2ML IJ SOLN
50.0000 ug | Freq: Once | INTRAMUSCULAR | Status: AC
  Administered 2017-12-21: 50 ug via INTRAVENOUS
  Filled 2017-12-21: qty 2

## 2017-12-21 MED ORDER — POLYETHYLENE GLYCOL 3350 17 G PO PACK: 17.0000 g | PACK | Freq: Every day | ORAL | Status: DC | PRN

## 2017-12-21 NOTE — ED Notes (Signed)
Report given to [facility], pt ready to go to 1602.

## 2017-12-21 NOTE — Clinical Social Work Note (Signed)
Clinical Social Work Assessment  Patient Details  Name: Tiffany [name] MRN: [id number] Date of Birth: 23-Mar-1927  Date of referral:  12/21/17               Reason for consult:  Facility Placement                Permission sought to share information with:  Case Manager, Family Supports Permission granted to share information::  Yes, Verbal Permission Granted  Name::        Agency::     Relationship::  Patient's son, Tiffany [name], and grandson present during assessment.  Contact Information:   Tiffany [name] can be reached at [phone]  Housing/Transportation Living arrangements for the past 2 months:  Independent Living Facility([facility], patient reports living in facility for 7 months) Source of Information:  Patient, Other (Comment Required)(Adult son, Tiffany [name]) Patient Interpreter Needed:  None Criminal Activity/Legal Involvement Pertinent to Current Situation/Hospitalization:  No - Comment as needed Significant Relationships:  Adult Children, Other(Comment)(Grandchildren) Lives with:  Facility Resident Do you feel safe going back to the place where you live?  Yes Need for family participation in patient care:  No (Coment)  Care giving concerns:  Patient fell in the living room of her independent living facility 3 days ago and was unable to reach her phone or get up. Patient found by family and arrived to [facility] via EMS.  Patient is concerned about having some injury to her hip because she is had several surgeries of her left hip before. Patient also has had surgery in her right knee. 84 year old female with a history of falling in home.  Social Worker assessment / plan:   CSW met with patient, adult son Tiffany [name] [phone]) and grandson at bedside to complete assessment, explain CSW role, and to assess patient's agreeableness to SNF placement should SNF be deemed appropriate for patient.  Patient reports living at [facility] for the past seven  months. Patient reports great satisfaction with her living arrangements, "it's a really lovely place." Patient reports so safety concerns. Patient reports that her son and grandson are very involved with her care and are able to visit often and transport her to appointments and to run errands. Patient's son lives fewer than two miles away and grandson also lives in [place].  Patient agreeable to SNF placement. Patient has previous SNF admission for 30 days to [facility]. Patient reports "good experiences" with [facility] and lists this as her preferred SNF.   Patient reports multiple hip surgeries and a knee replacement under Dr.Lucio. Patient has received PT in the past while hospitalized at [facility] and after she left [facility] SNF; neither the patient or her family can recall who provided PT at this time.  Employment status:  Retired Medical sales representative) PT Recommendations:  Not assessed at this time Information / Referral to community resources:  [place]  Patient/Family's Response to care:   Patient was pleasant, happy, engaged, and in good spirits. Patient's son and grandson at bedside. All parties appreciative of CSW intervention and reported satisfaction with care thus far.   Patient/Family's Understanding of and Emotional Response to Diagnosis, Current Treatment, and Prognosis:   Patient and family somewhat familiar with SNF placement procedure and expectations of care. Patient and family very receptive to SNF and had no further questions for CSW at time of assessment.   Emotional Assessment Appearance:  Appears stated age Attitude/Demeanor/Rapport:  Engaged, Self-Confident Affect (typically observed):  Happy, Pleasant, Hopeful Orientation:  Oriented to Self, Oriented to Place, Oriented to  Time, Oriented to Situation Alcohol / Substance use:  Not Applicable Psych involvement (Current and /or in the community):  No  (Comment)  Discharge Needs  Concerns to be addressed:  Home Safety Concerns Readmission within the last 30 days:  No Current discharge risk:  Lives alone, Other(Frequent falls) Barriers to Discharge:  [place], [place] 12/21/2017, 4:49 PM

## 2017-12-21 NOTE — ED Notes (Signed)
ED TO INPATIENT HANDOFF REPORT  Name/Age/Gender Tiffany [name] 84 y.o. female  Code Status Code Status History    Date Active Date Inactive Code Status Order ID Comments User Context   10/03/2012 2357 10/08/2012 1647 Full Code [id number]  Theressa Millard, MD ED      Home/SNF/Other Home  Chief Complaint fall hip pain  Level of Care/Admitting Diagnosis ED Disposition    ED Disposition Condition [facility] Area: [facility] [[id number]]  Level of Care: Med-Surg [16]  Diagnosis: Fall [290176]  Admitting Physician: Dessa Phi [[id number]]  Attending Physician: Dessa Phi [phone]  PT Class (Do Not Modify): Observation [104]  PT Acc Code (Do Not Modify): Observation [10022]       Medical History Past Medical History:  Diagnosis Date  . Arthritis   . Breast cancer ([place])   . Colon polyps   . Hypertension   . Hypothyroidism   . Osteoporosis   . Rectal bleeding   . Skin cancer     Allergies Allergies  Allergen Reactions  . Codeine Nausea Only  . Morphine And Related Anxiety and Other (See Comments)    Feel very unwell and out there  . Sulfa Antibiotics Hives and Itching    IV Location/Drains/Wounds Patient Lines/Drains/Airways Status   Active Line/Drains/Airways    Name:   Placement date:   Placement time:   Site:   Days:   Peripheral IV 12/21/17 Right Antecubital   12/21/17    1418    Antecubital   less than 1          Labs/Imaging Results for orders placed or performed during the hospital encounter of 12/21/17 (from the past 48 hour(s))  CBC     Status: Abnormal   Collection Time: 12/21/17  2:07 PM  Result Value Ref Range   WBC 12.4 (H) 4.0 - 10.5 K/uL   RBC 4.81 3.87 - 5.11 MIL/uL   Hemoglobin 14.6 12.0 - 15.0 g/dL   HCT 43.8 36.0 - 46.0 %   MCV 91.1 78.0 - 100.0 fL   MCH 30.4 26.0 - 34.0 pg   MCHC 33.3 30.0 - 36.0 g/dL   RDW 13.0 11.5 - 15.5 %   Platelets 171 150 - 400 K/uL    Comment: Performed at [facility], [place] [address], [place], [place] [address]  Basic metabolic panel     Status: Abnormal   Collection Time: 12/21/17  2:07 PM  Result Value Ref Range   Sodium 145 135 - 145 mmol/L   Potassium 3.9 3.5 - 5.1 mmol/L   Chloride 111 98 - 111 mmol/L   CO2 27 22 - 32 mmol/L   Glucose, Bld 156 (H) 70 - 99 mg/dL   BUN 31 (H) 8 - 23 mg/dL   Creatinine, Ser 1.04 (H) 0.44 - 1.00 mg/dL   Calcium 9.3 8.9 - 10.3 mg/dL   GFR calc non Af Amer 47 (L) >60 mL/min   GFR calc Af Amer 54 (L) >60 mL/min    Comment: (NOTE) The eGFR has been calculated using the CKD EPI equation. This calculation has not been validated in all clinical situations. eGFR's persistently <60 mL/min signify possible Chronic Kidney Disease.    Anion gap 7 5 - 15    Comment: Performed at [facility], [place] [address], [place],  [address]  CK     Status: Abnormal   Collection Time: 12/21/17  2:07 PM  Result Value Ref Range  Total CK 3,063 (H) 38 - 234 U/L    Comment: Performed at [facility], [place] [address], [place], [place] [address]   Dg Thoracic Spine 2 View  Result Date: 12/21/2017 CLINICAL DATA:  Fall a few days ago with mid back pain. EXAM: THORACIC SPINE 2 VIEWS COMPARISON:  05/21/2016 FINDINGS: Mild curvature of the thoracic spine convex left unchanged. There are degenerative changes of the thoracic spine. Pedicles are intact. The lower thoracic spine is not well visualized on the lateral view due in part to the curvature. No definite acute compression fracture. IMPRESSION: No acute findings. Curvature of the thoracic spine convex left unchanged with degenerative changes present. Electronically Signed   By: Marin Olp M.D.   On: 12/21/2017 15:26   Dg Lumbar Spine Complete  Result Date: 12/21/2017 CLINICAL DATA:  Fall over the past couple days with low back pain. EXAM: LUMBAR SPINE - COMPLETE 4+ VIEW COMPARISON:  05/21/2016 and CT 10/03/2012 FINDINGS:  Moderate curvature of the lower thoracic/lumbar spine convex right unchanged. Moderate degenerative changes of the lumbar spine. Facet arthropathy. Disc disease at multiple levels of the lumbar spine and lower thoracic spine with exception of the L4-5 level. Mild stable L3 compression fracture. Mild depression of the superior endplate of L4 new since 2014 although likely chronic. Mild-to-moderate anterior wedging of L1 which appears to be progressed since 2014 and likely chronic. Slight anterior wedging of T11 and T12 with mild progression 2014 although likely chronic. IMPRESSION: No definite acute findings. Stable L3 compression fracture. Interval progression of mild compression deformities of T11, T12 and L1 since 2014 and likely chronic. Depression of the superior endplate of L4 likely chronic. Moderate spondylosis of the lumbar spine to include facet arthropathy. Multilevel disc disease. Curvature convex right. Electronically Signed   By: Marin Olp M.D.   On: 12/21/2017 15:33   Dg Knee Complete 4 Views Left  Result Date: 12/21/2017 CLINICAL DATA:  Fall over the past couple days with left knee pain. EXAM: LEFT KNEE - COMPLETE 4+ VIEW COMPARISON:  None. FINDINGS: Diffuse osteopenia. Mild osteoarthritic change. No acute fracture or dislocation. No significant joint effusion. IMPRESSION: No acute fracture. Electronically Signed   By: Marin Olp M.D.   On: 12/21/2017 15:40   Dg Knee Complete 4 Views Right  Result Date: 12/21/2017 CLINICAL DATA:  Fall over the past few days with right knee pain. EXAM: RIGHT KNEE - COMPLETE 4+ VIEW COMPARISON:  None. FINDINGS: Diffuse osteopenia. Total right knee arthroplasty intact and normally located. No acute fracture, dislocation or significant joint effusion. IMPRESSION: No acute findings. Electronically Signed   By: Marin Olp M.D.   On: 12/21/2017 15:41   Dg Hips Bilat W Or Wo Pelvis 3-4 Views  Result Date: 12/21/2017 CLINICAL DATA:  Fall over the past few  days. EXAM: DG HIP (WITH OR WITHOUT PELVIS) 3-4V BILAT COMPARISON:  CT 10/03/2012 FINDINGS: Left hip arthroplasty intact with mild protrusio acetabuli. Mild diffuse osteopenia. Mild degenerative change of the right hip and moderate degenerate change of the spine. No acute fracture or dislocation. IMPRESSION: No acute fracture or dislocation. Mild degenerative change of the right hip. Left total of arthroplasty intact with mild protrusio acetabuli. Electronically Signed   By: Marin Olp M.D.   On: 12/21/2017 15:39    Pending Labs Unresulted Labs (From admission, onward)    Start     Ordered   12/21/17 1351  Urinalysis, Routine w reflex microscopic  Once,   R  12/21/17 1351          Vitals/Pain Today's Vitals   12/21/17 1305 12/21/17 1459 12/21/17 1528 12/21/17 1531  BP:    (!) 173/95  Pulse:    86  Resp:    19  Temp:      TempSrc:      SpO2:    100%  PainSc: 0-No pain Asleep 2      Isolation Precautions No active isolations  Medications Medications  sodium chloride 0.9 % bolus 500 mL (0 mLs Intravenous Stopped 12/21/17 1500)    Followed by  0.9 %  sodium chloride infusion (1,000 mLs Intravenous New Bag/Given 12/21/17 1421)  fentaNYL (SUBLIMAZE) injection 50 mcg (50 mcg Intravenous Given 12/21/17 1420)    Mobility walks with person assist

## 2017-12-21 NOTE — H&P (Signed)
History and Physical    Tiffany [name] YSA:[id number] DOB: Feb 16, 1927 DOA: 12/21/2017  PCP: Donald Prose, MD  Patient coming from: Independent living facility   Chief Complaint: Fall, weakness  HPI: Thresea [name] is a 84 y.o. female with medical history significant of HTN, hypothyroidism who presents after fall at her independent living facility. She usually sleeps on the couch; her son who last heard from her Thursday afternoon found her today down on the ground by the couch.  Patient is unsure how she fell off the couch.  She believes that she was sleeping, had a dream and fell.  She was unable to get up off the floor by herself.  When she was found by her family member, she was soiled with urine.  She denies any physical complaints such as chest pain, shortness of breath, vomiting, abdominal pain, diarrhea.  Her main complaint includes back pain and weakness.  ED Course: Labs obtained which revealed creatinine 1.04, CK 3063, WBC 12.4.  X-ray of bilateral knees, thoracic and lumbar spine, bilateral hips were unremarkable for acute fractures.  Review of Systems: As per HPI otherwise 10 point review of systems negative.   Past Medical History:  Diagnosis Date  . Arthritis   . Breast cancer ([place])   . Colon polyps   . Hypertension   . Hypothyroidism   . Osteoporosis   . Rectal bleeding   . Skin cancer     Past Surgical History:  Procedure Laterality Date  . ABDOMINAL HYSTERECTOMY    . BILATERAL TOTAL MASTECTOMY WITH AXILLARY LYMPH NODE DISSECTION    . CATARACT EXTRACTION    . CHOLECYSTECTOMY    . COLONOSCOPY N/A 10/07/2012   Procedure: COLONOSCOPY;  Surgeon: Arta Silence, MD;  Location: [facility] ENDOSCOPY;  Service: Endoscopy;  Laterality: N/A;  . hip replacment    . HIP SURGERY    . JOINT REPLACEMENT    . MASTECTOMY    . REPLACEMENT TOTAL KNEE    . shin repair       reports that she has never smoked. She has never used smokeless tobacco. She reports that she does not drink  alcohol or use drugs.  Allergies  Allergen Reactions  . Codeine Nausea Only  . Morphine And Related Anxiety and Other (See Comments)    Feel very unwell and out there  . Sulfa Antibiotics Hives and Itching    Family History  Problem Relation Age of Onset  . Glaucoma Mother   . Coronary artery disease Father   . Diabetes Sister   . Diabetes Brother     Prior to Admission medications   Medication Sig Start Date End Date Taking? Authorizing Provider  irbesartan (AVAPRO) 300 MG tablet Take 300 mg by mouth every morning.   Yes [provider]  levothyroxine (SYNTHROID, LEVOTHROID) 88 MCG tablet Take 88 mcg by mouth daily before breakfast.   Yes [provider]  metoprolol (LOPRESSOR) 50 MG tablet Take 50 mg by mouth 2 (two) times daily.   Yes [provider]  diclofenac sodium (VOLTAREN) 1 % GEL Apply 2 g topically 4 (four) times daily. Patient not taking: Reported on 12/21/2017 05/21/16   Blanchie Dessert, MD    Physical Exam: Vitals:   12/21/17 1301 12/21/17 1531 12/21/17 1600  BP: (!) 165/89 (!) 173/95 (!) 173/61  Pulse: 85 86 83  Resp: 18 19 16   Temp: 98.2 F (36.8 C)    TempSrc: Oral    SpO2: 98% 100% 100%     Constitutional:  NAD, calm, comfortable Eyes: PERRL, lids and conjunctivae normal ENMT: Mucous membranes are moist. Posterior pharynx clear of any exudate or lesions.Normal dentition.  Neck: normal, supple, no masses, no thyromegaly Respiratory: clear to auscultation bilaterally, no wheezing, no crackles. Normal respiratory effort. No accessory muscle use.  Cardiovascular: Regular rate and rhythm, no murmurs / rubs / gallops. No extremity edema.  Abdomen: no tenderness, no masses palpated. No hepatosplenomegaly. Bowel sounds positive.  Musculoskeletal: no clubbing / cyanosis. No joint deformity upper and lower extremities. Normal muscle tone.  Skin: no rashes, lesions, ulcers. No induration Neurologic: CN 2-12 grossly intact. Strength  5/5 in all 4.  Psychiatric: Normal judgment and insight. Alert and oriented x 3. Normal mood.   Labs on Admission: I have personally reviewed following labs and imaging studies  CBC: Recent Labs  Lab 12/21/17 1407  WBC 12.4*  HGB 14.6  HCT 43.8  MCV 91.1  PLT 476   Basic Metabolic Panel: Recent Labs  Lab 12/21/17 1407  NA 145  K 3.9  CL 111  CO2 27  GLUCOSE 156*  BUN 31*  CREATININE 1.04*  CALCIUM 9.3   GFR: CrCl cannot be calculated (Unknown ideal weight.). Liver Function Tests: No results for input(s): AST, ALT, ALKPHOS, BILITOT, PROT, ALBUMIN in the last 168 hours. No results for input(s): LIPASE, AMYLASE in the last 168 hours. No results for input(s): AMMONIA in the last 168 hours. Coagulation Profile: No results for input(s): INR, PROTIME in the last 168 hours. Cardiac Enzymes: Recent Labs  Lab 12/21/17 1407  CKTOTAL 3,063*   BNP (last 3 results) No results for input(s): PROBNP in the last 8760 hours. HbA1C: No results for input(s): HGBA1C in the last 72 hours. CBG: No results for input(s): GLUCAP in the last 168 hours. Lipid Profile: No results for input(s): CHOL, HDL, LDLCALC, TRIG, CHOLHDL, LDLDIRECT in the last 72 hours. Thyroid Function Tests: No results for input(s): TSH, T4TOTAL, FREET4, T3FREE, THYROIDAB in the last 72 hours. Anemia Panel: No results for input(s): VITAMINB12, FOLATE, FERRITIN, TIBC, IRON, RETICCTPCT in the last 72 hours. Urine analysis:    Component Value Date/Time   COLORURINE YELLOW 02/09/2015 0158   APPEARANCEUR CLOUDY (A) 02/09/2015 0158   LABSPEC 1.015 02/09/2015 0158   PHURINE 6.5 02/09/2015 0158   GLUCOSEU NEGATIVE 02/09/2015 0158   HGBUR NEGATIVE 02/09/2015 0158   BILIRUBINUR NEGATIVE 02/09/2015 0158   KETONESUR NEGATIVE 02/09/2015 0158   PROTEINUR NEGATIVE 02/09/2015 0158   UROBILINOGEN 0.2 02/09/2015 0158   NITRITE NEGATIVE 02/09/2015 0158   LEUKOCYTESUR MODERATE (A) 02/09/2015 0158   Sepsis Labs:  !!!!!!!!!!!!!!!!!!!!!!!!!!!!!!!!!!!!!!!!!!!! @LABRCNTIP (procalcitonin:4,lacticidven:4) )No results found for this or any previous visit (from the past 240 hour(s)).   Radiological Exams on Admission: Dg Thoracic Spine 2 View  Result Date: 12/21/2017 CLINICAL DATA:  Fall a few days ago with mid back pain. EXAM: THORACIC SPINE 2 VIEWS COMPARISON:  05/21/2016 FINDINGS: Mild curvature of the thoracic spine convex left unchanged. There are degenerative changes of the thoracic spine. Pedicles are intact. The lower thoracic spine is not well visualized on the lateral view due in part to the curvature. No definite acute compression fracture. IMPRESSION: No acute findings. Curvature of the thoracic spine convex left unchanged with degenerative changes present. Electronically Signed   By: Marin Olp M.D.   On: 12/21/2017 15:26   Dg Lumbar Spine Complete  Result Date: 12/21/2017 CLINICAL DATA:  Fall over the past couple days with low back pain. EXAM: LUMBAR SPINE - COMPLETE 4+ VIEW COMPARISON:  05/21/2016 and CT 10/03/2012 FINDINGS: Moderate curvature of the lower thoracic/lumbar spine convex right unchanged. Moderate degenerative changes of the lumbar spine. Facet arthropathy. Disc disease at multiple levels of the lumbar spine and lower thoracic spine with exception of the L4-5 level. Mild stable L3 compression fracture. Mild depression of the superior endplate of L4 new since 2014 although likely chronic. Mild-to-moderate anterior wedging of L1 which appears to be progressed since 2014 and likely chronic. Slight anterior wedging of T11 and T12 with mild progression 2014 although likely chronic. IMPRESSION: No definite acute findings. Stable L3 compression fracture. Interval progression of mild compression deformities of T11, T12 and L1 since 2014 and likely chronic. Depression of the superior endplate of L4 likely chronic. Moderate spondylosis of the lumbar spine to include facet arthropathy. Multilevel disc  disease. Curvature convex right. Electronically Signed   By: Marin Olp M.D.   On: 12/21/2017 15:33   Dg Knee Complete 4 Views Left  Result Date: 12/21/2017 CLINICAL DATA:  Fall over the past couple days with left knee pain. EXAM: LEFT KNEE - COMPLETE 4+ VIEW COMPARISON:  None. FINDINGS: Diffuse osteopenia. Mild osteoarthritic change. No acute fracture or dislocation. No significant joint effusion. IMPRESSION: No acute fracture. Electronically Signed   By: Marin Olp M.D.   On: 12/21/2017 15:40   Dg Knee Complete 4 Views Right  Result Date: 12/21/2017 CLINICAL DATA:  Fall over the past few days with right knee pain. EXAM: RIGHT KNEE - COMPLETE 4+ VIEW COMPARISON:  None. FINDINGS: Diffuse osteopenia. Total right knee arthroplasty intact and normally located. No acute fracture, dislocation or significant joint effusion. IMPRESSION: No acute findings. Electronically Signed   By: Marin Olp M.D.   On: 12/21/2017 15:41   Dg Hips Bilat W Or Wo Pelvis 3-4 Views  Result Date: 12/21/2017 CLINICAL DATA:  Fall over the past few days. EXAM: DG HIP (WITH OR WITHOUT PELVIS) 3-4V BILAT COMPARISON:  CT 10/03/2012 FINDINGS: Left hip arthroplasty intact with mild protrusio acetabuli. Mild diffuse osteopenia. Mild degenerative change of the right hip and moderate degenerate change of the spine. No acute fracture or dislocation. IMPRESSION: No acute fracture or dislocation. Mild degenerative change of the right hip. Left total of arthroplasty intact with mild protrusio acetabuli. Electronically Signed   By: Marin Olp M.D.   On: 12/21/2017 15:39    EKG: Independently reviewed.  Normal sinus rhythm with right bundle branch block  Assessment/Plan Principal Problem:   Fall Active Problems:   HTN (hypertension)   Generalized weakness   Rhabdomyolysis   CKD (chronic kidney disease) stage 3, GFR 30-59 ml/min (HCC)   Hypothyroidism   Fall and generalized weakness -No acute fractures or injuries on  xrays  -PT OT eval -UA pending   Mild rhabdomyolysis -IVF  CKD stage 3 -Baseline Cr ~1  -Stable   HTN -Avapro -Lopressor  Hypothyroidism -Synthroid    DVT prophylaxis: Lovenox Code Status: DNR, confirmed with son HCPOA at bedside  Family Communication: Son and grandson at bedside  Disposition Plan: Pending clinical improvement, PT OT eval Consults called: None  Admission status: Observation   Severity of Illness: The appropriate patient status for this patient is OBSERVATION. Observation status is judged to be reasonable and necessary in order to provide the required intensity of service to ensure the patient's safety. The patient's presenting symptoms, physical exam findings, and initial radiographic and laboratory data in the context of their medical condition is felt to place them at decreased risk for further clinical deterioration.  Furthermore, it is anticipated that the patient will be medically stable for discharge from the hospital within 2 midnights of admission.   Dessa Phi, DO [facility] [email or web address] Password [email or web address] 12/21/2017, 5:20 PM

## 2017-12-21 NOTE — Progress Notes (Signed)
Pt lives at independent living at [facility]. She sleeps on the sofa and apparently fell off and was unable to get up. She was there on the floor for 2-3 days. Her son found her incontinent of urine on the floor. Pt is alert and oriented. Complains of lower back pain, relieved by Norco.

## 2017-12-21 NOTE — ED Notes (Signed)
Bed: WA06 Expected date:  Expected time:  Means of arrival:  Comments: 84 yo fall, hip pain

## 2017-12-21 NOTE — ED Triage Notes (Signed)
Per EMS, pt fell sometime over the past couple of days. Pt was found on the floor of her house this morning by her grandson and son. Pt's son last saw the pt 3 days ago. Pt was incontinent. Pt states she may have hit her head. Pt is not on blood thinners. Pt lives in independent living at [facility].

## 2017-12-21 NOTE — ED Notes (Signed)
Attempted to call nurse, no answer.

## 2017-12-21 NOTE — ED Provider Notes (Signed)
[place] DEPT Provider Note   CSN: [id number] Arrival date & time: 12/21/17  1252   History   Chief Complaint Chief Complaint  Patient presents with  . Fall    HPI Tiffany [name] is a 84 y.o. female.  HPI Patient states she sleeps on the couch.  Patient states she fell off the couch 2 days ago sleeping.  She felt acute pain in her lower back and she twisted her knee.  Since that time the patient has not been able to get up off the floor.  She was not able to reach her phone so she was unable to call family members.  Her son last saw her 3 days ago.  Patient was found on the floor of home at her her independent living facility.  Family called EMS to bring her to the emergency room.  Patient is having pain primarily in her lower back as well as her left knee.  She is concerned about having some injury to her hip because she is had several surgeries of her left hip before.  Patient also has had surgery in her right knee.  Denies any headache or head injury.  No chest pain or shortness of breath.  No abdominal pain.  No vomiting or diarrhea. Past Medical History:  Diagnosis Date  . Arthritis   . Breast cancer ([place])   . Colon polyps   . Hypertension   . Hypothyroidism   . Osteoporosis   . Rectal bleeding   . Skin cancer     Patient Active Problem List   Diagnosis Date Noted  . Palpitations 12/17/2012  . Internal bleeding hemorrhoids 10/26/2012  . Rectal bleeding 10/04/2012  . HTN (hypertension) 10/04/2012    Past Surgical History:  Procedure Laterality Date  . ABDOMINAL HYSTERECTOMY    . BILATERAL TOTAL MASTECTOMY WITH AXILLARY LYMPH NODE DISSECTION    . CATARACT EXTRACTION    . CHOLECYSTECTOMY    . COLONOSCOPY N/A 10/07/2012   Procedure: COLONOSCOPY;  Surgeon: Arta Silence, MD;  Location: [facility] ENDOSCOPY;  Service: Endoscopy;  Laterality: N/A;  . hip replacment    . HIP SURGERY    . JOINT REPLACEMENT    . MASTECTOMY    . REPLACEMENT TOTAL  KNEE    . shin repair       OB History   None      Home Medications    Prior to Admission medications   Medication Sig Start Date End Date Taking? Authorizing Provider  irbesartan (AVAPRO) 300 MG tablet Take 300 mg by mouth every morning.   Yes [provider]  levothyroxine (SYNTHROID, LEVOTHROID) 88 MCG tablet Take 88 mcg by mouth daily before breakfast.   Yes [provider]  metoprolol (LOPRESSOR) 50 MG tablet Take 50 mg by mouth 2 (two) times daily.   Yes [provider]  diclofenac sodium (VOLTAREN) 1 % GEL Apply 2 g topically 4 (four) times daily. Patient not taking: Reported on 12/21/2017 05/21/16   Blanchie Dessert, MD    Family History Family History  Problem Relation Age of Onset  . Glaucoma Mother   . Coronary artery disease Father   . Diabetes Sister   . Diabetes Brother     Social History Social History   Tobacco Use  . Smoking status: Never Smoker  . Smokeless tobacco: Never Used  Substance Use Topics  . Alcohol use: No  . Drug use: No     Allergies   Codeine; Morphine and related;  and Sulfa antibiotics   Review of Systems Review of Systems  All other systems reviewed and are negative.    Physical Exam Updated Vital Signs BP (!) 173/95   Pulse 86   Temp 98.2 F (36.8 C) (Oral)   Resp 19   SpO2 100%   Physical Exam  Constitutional: She appears well-developed and well-nourished. No distress.  HENT:  Head: Normocephalic and atraumatic.  Right Ear: External ear normal.  Left Ear: External ear normal.  Eyes: Conjunctivae are normal. Right eye exhibits no discharge. Left eye exhibits no discharge. No scleral icterus.  Neck: Neck supple. No tracheal deviation present.  Cardiovascular: Normal rate, regular rhythm and intact distal pulses.  Pulmonary/Chest: Effort normal and breath sounds normal. No stridor. No respiratory distress. She has no wheezes. She has no rales.  Abdominal: Soft. Bowel sounds are normal.  She exhibits no distension. There is no tenderness. There is no rebound and no guarding.  Genitourinary:  Genitourinary Comments: Some erythema of the sacral and buttock region, small amount of ecchymoses noted on the left buttock  Musculoskeletal: She exhibits no edema.       Right hip: She exhibits normal range of motion and no tenderness.       Left hip: She exhibits normal range of motion and no tenderness.       Left knee: She exhibits swelling. Tenderness found.       Cervical back: Normal.       Thoracic back: She exhibits tenderness and bony tenderness.       Lumbar back: She exhibits tenderness and bony tenderness.  Neurological: She is alert. She has normal strength. No cranial nerve deficit (no facial droop, extraocular movements intact, no slurred speech) or sensory deficit. She exhibits normal muscle tone. She displays no seizure activity. Coordination normal.  Skin: Skin is warm and dry. No rash noted. She is not diaphoretic.  Psychiatric: She has a normal mood and affect.  Nursing note and vitals reviewed.    ED Treatments / Results  Labs (all labs ordered are listed, but only abnormal results are displayed) Labs Reviewed  CBC - Abnormal; Notable for the following components:      Result Value   WBC 12.4 (*)    All other components within normal limits  BASIC METABOLIC PANEL - Abnormal; Notable for the following components:   Glucose, Bld 156 (*)    BUN 31 (*)    Creatinine, Ser 1.04 (*)    GFR calc non Af Amer 47 (*)    GFR calc Af Amer 54 (*)    All other components within normal limits  CK - Abnormal; Notable for the following components:   Total CK 3,063 (*)    All other components within normal limits  URINALYSIS, ROUTINE W REFLEX MICROSCOPIC    EKG EKG Interpretation  Date/Time:  Sunday December 21 2017 14:10:29 EDT Ventricular Rate:  80 PR Interval:    QRS Duration: 144 QT Interval:  421 QTC Calculation: 486 R Axis:   63 Text Interpretation:  Sinus  rhythm Left atrial enlargement Right bundle branch block , new since last tracing Confirmed by Dorie Rank [phone]) on 12/21/2017 2:12:30 PM   Radiology Dg Thoracic Spine 2 View  Result Date: 12/21/2017 CLINICAL DATA:  Fall a few days ago with mid back pain. EXAM: THORACIC SPINE 2 VIEWS COMPARISON:  05/21/2016 FINDINGS: Mild curvature of the thoracic spine convex left unchanged. There are degenerative changes of the thoracic spine. Pedicles are intact. The  lower thoracic spine is not well visualized on the lateral view due in part to the curvature. No definite acute compression fracture. IMPRESSION: No acute findings. Curvature of the thoracic spine convex left unchanged with degenerative changes present. Electronically Signed   By: Marin Olp M.D.   On: 12/21/2017 15:26   Dg Lumbar Spine Complete  Result Date: 12/21/2017 CLINICAL DATA:  Fall over the past couple days with low back pain. EXAM: LUMBAR SPINE - COMPLETE 4+ VIEW COMPARISON:  05/21/2016 and CT 10/03/2012 FINDINGS: Moderate curvature of the lower thoracic/lumbar spine convex right unchanged. Moderate degenerative changes of the lumbar spine. Facet arthropathy. Disc disease at multiple levels of the lumbar spine and lower thoracic spine with exception of the L4-5 level. Mild stable L3 compression fracture. Mild depression of the superior endplate of L4 new since 2014 although likely chronic. Mild-to-moderate anterior wedging of L1 which appears to be progressed since 2014 and likely chronic. Slight anterior wedging of T11 and T12 with mild progression 2014 although likely chronic. IMPRESSION: No definite acute findings. Stable L3 compression fracture. Interval progression of mild compression deformities of T11, T12 and L1 since 2014 and likely chronic. Depression of the superior endplate of L4 likely chronic. Moderate spondylosis of the lumbar spine to include facet arthropathy. Multilevel disc disease. Curvature convex right. Electronically  Signed   By: Marin Olp M.D.   On: 12/21/2017 15:33   Dg Knee Complete 4 Views Left  Result Date: 12/21/2017 CLINICAL DATA:  Fall over the past couple days with left knee pain. EXAM: LEFT KNEE - COMPLETE 4+ VIEW COMPARISON:  None. FINDINGS: Diffuse osteopenia. Mild osteoarthritic change. No acute fracture or dislocation. No significant joint effusion. IMPRESSION: No acute fracture. Electronically Signed   By: Marin Olp M.D.   On: 12/21/2017 15:40   Dg Knee Complete 4 Views Right  Result Date: 12/21/2017 CLINICAL DATA:  Fall over the past few days with right knee pain. EXAM: RIGHT KNEE - COMPLETE 4+ VIEW COMPARISON:  None. FINDINGS: Diffuse osteopenia. Total right knee arthroplasty intact and normally located. No acute fracture, dislocation or significant joint effusion. IMPRESSION: No acute findings. Electronically Signed   By: Marin Olp M.D.   On: 12/21/2017 15:41   Dg Hips Bilat W Or Wo Pelvis 3-4 Views  Result Date: 12/21/2017 CLINICAL DATA:  Fall over the past few days. EXAM: DG HIP (WITH OR WITHOUT PELVIS) 3-4V BILAT COMPARISON:  CT 10/03/2012 FINDINGS: Left hip arthroplasty intact with mild protrusio acetabuli. Mild diffuse osteopenia. Mild degenerative change of the right hip and moderate degenerate change of the spine. No acute fracture or dislocation. IMPRESSION: No acute fracture or dislocation. Mild degenerative change of the right hip. Left total of arthroplasty intact with mild protrusio acetabuli. Electronically Signed   By: Marin Olp M.D.   On: 12/21/2017 15:39    Procedures Procedures (including critical care time)  Medications Ordered in ED Medications  sodium chloride 0.9 % bolus 500 mL (0 mLs Intravenous Stopped 12/21/17 1500)    Followed by  0.9 %  sodium chloride infusion (1,000 mLs Intravenous New Bag/Given 12/21/17 1421)  fentaNYL (SUBLIMAZE) injection 50 mcg (50 mcg Intravenous Given 12/21/17 1420)     Initial Impression / Assessment and Plan / ED Course   I have reviewed the triage vital signs and the nursing notes.  Pertinent labs & imaging results that were available during my care of the patient were reviewed by me and considered in my medical decision making (see chart for details).  Patient presented to the emergency room for evaluation after a fall.  Patient has not been able to get off the floor for the last couple of days.  Family found her lying on the ground soaked in her urine.  Patient's ED work-up is noted for elevated white blood cell count as well as a slight increase in her BUN and creatinine.  She also has an elevated creatinine kinase consistent with her being down on the ground for a couple of days.  X-rays do not show any evidence of any acute fracture.  Considering the patient's weakness and inability to get up and her rhabdo I will consult the medical service to bring her in for IV fluid hydration and physical therapy evaluation.   Final Clinical Impressions(s) / ED Diagnoses   Final diagnoses:  Fall, initial encounter  Traumatic rhabdomyolysis, initial encounter ([place])  Weakness      Dorie Rank, MD 12/21/17 [phone]

## 2017-12-21 NOTE — ED Notes (Signed)
Patient transported to X-ray 

## 2017-12-21 NOTE — Progress Notes (Signed)
CSW met with patient and family at bedside to complete assessment and assess receptiveness to SNF placement if deemed appropriate for patient care and recovery.  Patient reports living at [facility] for the past seven months.   Patient agreeable to SNF placement. Patient has previous SNF admission for [facility]. Patient reports "good experiences" with [facility] and lists this as her preferred SNF.    Stephanie Acre, [place] Social Worker [phone]

## 2017-12-22 ENCOUNTER — Other Ambulatory Visit (HOSPITAL_COMMUNITY): Payer: Medicare Other

## 2017-12-22 ENCOUNTER — Other Ambulatory Visit: Payer: Medicare Other

## 2017-12-22 ENCOUNTER — Observation Stay (HOSPITAL_COMMUNITY): Payer: Medicare HMO

## 2017-12-22 DIAGNOSIS — Z90710 Acquired absence of both cervix and uterus: Secondary | ICD-10-CM | POA: Diagnosis not present

## 2017-12-22 DIAGNOSIS — Z9013 Acquired absence of bilateral breasts and nipples: Secondary | ICD-10-CM | POA: Diagnosis not present

## 2017-12-22 DIAGNOSIS — S0990XA Unspecified injury of head, initial encounter: Secondary | ICD-10-CM | POA: Diagnosis not present

## 2017-12-22 DIAGNOSIS — W19XXXS Unspecified fall, sequela: Secondary | ICD-10-CM | POA: Diagnosis not present

## 2017-12-22 DIAGNOSIS — R002 Palpitations: Secondary | ICD-10-CM | POA: Diagnosis not present

## 2017-12-22 DIAGNOSIS — I63411 Cerebral infarction due to embolism of right middle cerebral artery: Secondary | ICD-10-CM | POA: Diagnosis not present

## 2017-12-22 DIAGNOSIS — M6281 Muscle weakness (generalized): Secondary | ICD-10-CM | POA: Diagnosis not present

## 2017-12-22 DIAGNOSIS — Z9849 Cataract extraction status, unspecified eye: Secondary | ICD-10-CM | POA: Diagnosis not present

## 2017-12-22 DIAGNOSIS — R531 Weakness: Secondary | ICD-10-CM | POA: Diagnosis not present

## 2017-12-22 DIAGNOSIS — Z66 Do not resuscitate: Secondary | ICD-10-CM | POA: Diagnosis not present

## 2017-12-22 DIAGNOSIS — R29701 NIHSS score 1: Secondary | ICD-10-CM | POA: Diagnosis not present

## 2017-12-22 DIAGNOSIS — Y92009 Unspecified place in unspecified non-institutional (private) residence as the place of occurrence of the external cause: Secondary | ICD-10-CM | POA: Diagnosis not present

## 2017-12-22 DIAGNOSIS — Z882 Allergy status to sulfonamides status: Secondary | ICD-10-CM | POA: Diagnosis not present

## 2017-12-22 DIAGNOSIS — Z79899 Other long term (current) drug therapy: Secondary | ICD-10-CM | POA: Diagnosis not present

## 2017-12-22 DIAGNOSIS — L8990 Pressure ulcer of unspecified site, unspecified stage: Secondary | ICD-10-CM

## 2017-12-22 DIAGNOSIS — I129 Hypertensive chronic kidney disease with stage 1 through stage 4 chronic kidney disease, or unspecified chronic kidney disease: Secondary | ICD-10-CM | POA: Diagnosis not present

## 2017-12-22 DIAGNOSIS — W19XXXA Unspecified fall, initial encounter: Secondary | ICD-10-CM | POA: Diagnosis not present

## 2017-12-22 DIAGNOSIS — R29810 Facial weakness: Secondary | ICD-10-CM | POA: Diagnosis not present

## 2017-12-22 DIAGNOSIS — J449 Chronic obstructive pulmonary disease, unspecified: Secondary | ICD-10-CM | POA: Diagnosis not present

## 2017-12-22 DIAGNOSIS — I361 Nonrheumatic tricuspid (valve) insufficiency: Secondary | ICD-10-CM | POA: Diagnosis not present

## 2017-12-22 DIAGNOSIS — M810 Age-related osteoporosis without current pathological fracture: Secondary | ICD-10-CM | POA: Diagnosis present

## 2017-12-22 DIAGNOSIS — I6310 Cerebral infarction due to embolism of unspecified precerebral artery: Secondary | ICD-10-CM | POA: Diagnosis not present

## 2017-12-22 DIAGNOSIS — D696 Thrombocytopenia, unspecified: Secondary | ICD-10-CM | POA: Diagnosis not present

## 2017-12-22 DIAGNOSIS — R2689 Other abnormalities of gait and mobility: Secondary | ICD-10-CM | POA: Diagnosis not present

## 2017-12-22 DIAGNOSIS — R278 Other lack of coordination: Secondary | ICD-10-CM | POA: Diagnosis not present

## 2017-12-22 DIAGNOSIS — Z85828 Personal history of other malignant neoplasm of skin: Secondary | ICD-10-CM | POA: Diagnosis not present

## 2017-12-22 DIAGNOSIS — Z885 Allergy status to narcotic agent status: Secondary | ICD-10-CM | POA: Diagnosis not present

## 2017-12-22 DIAGNOSIS — R279 Unspecified lack of coordination: Secondary | ICD-10-CM | POA: Diagnosis not present

## 2017-12-22 DIAGNOSIS — R4182 Altered mental status, unspecified: Secondary | ICD-10-CM | POA: Diagnosis not present

## 2017-12-22 DIAGNOSIS — T796XXA Traumatic ischemia of muscle, initial encounter: Secondary | ICD-10-CM

## 2017-12-22 DIAGNOSIS — R41841 Cognitive communication deficit: Secondary | ICD-10-CM | POA: Diagnosis not present

## 2017-12-22 DIAGNOSIS — N183 Chronic kidney disease, stage 3 (moderate): Secondary | ICD-10-CM

## 2017-12-22 DIAGNOSIS — I639 Cerebral infarction, unspecified: Secondary | ICD-10-CM

## 2017-12-22 DIAGNOSIS — R51 Headache: Secondary | ICD-10-CM | POA: Diagnosis not present

## 2017-12-22 DIAGNOSIS — Z96659 Presence of unspecified artificial knee joint: Secondary | ICD-10-CM | POA: Diagnosis present

## 2017-12-22 DIAGNOSIS — Z743 Need for continuous supervision: Secondary | ICD-10-CM | POA: Diagnosis not present

## 2017-12-22 DIAGNOSIS — L89151 Pressure ulcer of sacral region, stage 1: Secondary | ICD-10-CM | POA: Diagnosis not present

## 2017-12-22 DIAGNOSIS — E039 Hypothyroidism, unspecified: Secondary | ICD-10-CM | POA: Diagnosis present

## 2017-12-22 DIAGNOSIS — Z9049 Acquired absence of other specified parts of digestive tract: Secondary | ICD-10-CM | POA: Diagnosis not present

## 2017-12-22 DIAGNOSIS — I10 Essential (primary) hypertension: Secondary | ICD-10-CM | POA: Diagnosis not present

## 2017-12-22 DIAGNOSIS — Z96649 Presence of unspecified artificial hip joint: Secondary | ICD-10-CM | POA: Diagnosis present

## 2017-12-22 DIAGNOSIS — R296 Repeated falls: Secondary | ICD-10-CM | POA: Diagnosis present

## 2017-12-22 DIAGNOSIS — W07XXXA Fall from chair, initial encounter: Secondary | ICD-10-CM | POA: Diagnosis not present

## 2017-12-22 DIAGNOSIS — Z853 Personal history of malignant neoplasm of breast: Secondary | ICD-10-CM | POA: Diagnosis not present

## 2017-12-22 DIAGNOSIS — Z79890 Hormone replacement therapy (postmenopausal): Secondary | ICD-10-CM | POA: Diagnosis not present

## 2017-12-22 DIAGNOSIS — R488 Other symbolic dysfunctions: Secondary | ICD-10-CM | POA: Diagnosis not present

## 2017-12-22 LAB — BASIC METABOLIC PANEL
Anion gap: 3 — ABNORMAL LOW (ref 5–15)
BUN: 30 mg/dL — ABNORMAL HIGH (ref 8–23)
CO2: 24 mmol/L (ref 22–32)
Calcium: 8.3 mg/dL — ABNORMAL LOW (ref 8.9–10.3)
Chloride: 116 mmol/L — ABNORMAL HIGH (ref 98–111)
Creatinine, Ser: 0.93 mg/dL (ref 0.44–1.00)
GFR calc Af Amer: >60 mL/min (ref >60)
GFR calc non Af Amer: 53 mL/min — ABNORMAL LOW (ref >60)
Glucose, Bld: 104 mg/dL — ABNORMAL HIGH (ref 70–99)
Potassium: 3.6 mmol/L (ref 3.5–5.1)
Sodium: 143 mmol/L (ref 135–145)

## 2017-12-22 LAB — CBC
HCT: 35.3 % — ABNORMAL LOW (ref 36.0–46.0)
Hemoglobin: 11.3 g/dL — ABNORMAL LOW (ref 12.0–15.0)
MCH: 29.8 pg (ref 26.0–34.0)
MCHC: 32 g/dL (ref 30.0–36.0)
MCV: 93.1 fL (ref 78.0–100.0)
Platelets: 134 10*3/uL — ABNORMAL LOW (ref 150–400)
RBC: 3.79 MIL/uL — ABNORMAL LOW (ref 3.87–5.11)
RDW: 13.5 % (ref 11.5–15.5)
WBC: 9 10*3/uL (ref 4.0–10.5)

## 2017-12-22 LAB — CK: Total CK: 819 U/L — ABNORMAL HIGH (ref 38–234)

## 2017-12-22 MED ORDER — LORAZEPAM 2 MG/ML IJ SOLN
0.5000 mg | Freq: Once | INTRAMUSCULAR | Status: AC
  Administered 2017-12-22: 0.5 mg via INTRAVENOUS
  Filled 2017-12-22: qty 1

## 2017-12-22 MED ORDER — ASPIRIN 300 MG RE SUPP
300.0000 mg | Freq: Every day | RECTAL | Status: DC
  Administered 2017-12-22: 300 mg via RECTAL
  Filled 2017-12-22 (×2): qty 1

## 2017-12-22 MED ORDER — ASPIRIN 325 MG PO TABS
325.0000 mg | ORAL_TABLET | Freq: Every day | ORAL | Status: DC
  Filled 2017-12-22: qty 1

## 2017-12-22 MED ORDER — STROKE: EARLY STAGES OF RECOVERY BOOK
Freq: Once | Status: AC
  Administered 2017-12-22: 16:00:00
  Filled 2017-12-22: qty 1

## 2017-12-22 NOTE — Progress Notes (Signed)
Patient ID: Tiffany [name], female   DOB: 06-Oct-1926, 84 y.o.   MRN: [id number]  PROGRESS NOTE    Tiffany [name]  FFM:[id number] DOB: 1926/05/24 DOA: 12/21/2017 PCP: Donald Prose, MD   Brief Narrative:  84 year old female with history of hypertension, hypothyroidism presented on 12/21/2017 with fall and weakness.  She was found to have elevated CK, x-ray of bilateral knees, thoracic and lumbar spine, bilateral hips were unremarkable for acute fractures.   Assessment & Plan:   Principal Problem:   Fall Active Problems:   HTN (hypertension)   Generalized weakness   Rhabdomyolysis   CKD (chronic kidney disease) stage 3, GFR 30-59 ml/min (HCC)   Hypothyroidism   Acute right sided probable ischemic infarct -Patient with decreased field of vision on her left side with no other motor deficits.  This was noticed this morning.  Out of TPA window.  Spoke to Dr. Lindzen/neurology, patient will be transferred to [facility] for further stroke work-up.  Will order stat MRI of the brain/ultrasound carotids/2D echo.  N.p.o. for now until evaluation by speech therapist.  Aspirin.  Statins once patient started on a diet.  PT/OT eval.  Fall and generalized weakness -No acute fractures or injuries on x-rays -Plan as above  Mild rhabdomyolysis -CK level improving.  Decrease normal saline to 100 cc an hour.  Repeat CK in a.m.  Leukocytosis -Reactive.  Resolved  Thrombocytopenia -Questionable cause.  Monitor.  No signs of bleeding  CKD stage III -Baseline.  Monitor  Hypertension -Monitor blood pressure.  Will hold antihypertensives for now until evaluation by neurology  Hypothyroidism -Continue Synthroid   DVT prophylaxis: Lovenox Code Status: DNR Family Communication: None at bedside Disposition Plan: Nursing home once clinically improved and cleared by neurology  Consultants: Neurology  Procedures: None  Antimicrobials: None   Subjective: Patient seen and examined at  bedside.  She is sleepy, wakes up slightly on calling her name.  Does not answer much questions and denies much pain.  Objective: Vitals:   12/21/17 2020 12/21/17 2022 12/22/17 0438 12/22/17 0950  BP: (!) 146/51 (!) 140/51 (!) 132/59 136/89  Pulse: 72 72 68   Resp: 12  16   Temp: 99.4 F (37.4 C)  99 F (37.2 C)   TempSrc: Oral  Oral   SpO2: 99% 100% 100%     Intake/Output Summary (Last 24 hours) at 12/22/2017 1227 Last data filed at 12/22/2017 5701 Gross per 24 hour  Intake 1739.42 ml  Output -  Net 1739.42 ml   There were no vitals filed for this visit.  Examination:  General exam: Elderly female, sleepy, wakes up on calling her name.  No distress Respiratory system: Bilateral decreased breath sounds at bases Cardiovascular system: S1 & S2 heard, Rate controlled Gastrointestinal system: Abdomen is nondistended, soft and nontender. Normal bowel sounds heard. Extremities: No cyanosis, clubbing, edema  Central nervous system: Alert and oriented after she wakes up. Moving extremities.  Power is 4+ by 5 in all extremities.  Decreased field of vision in the left eye. Skin: No rashes, lesions or ulcers Psychiatry: Could not be evaluated properly as patient is still waking up.    Data Reviewed: I have personally reviewed following labs and imaging studies  CBC: Recent Labs  Lab 12/21/17 1407 12/22/17 0524  WBC 12.4* 9.0  HGB 14.6 11.3*  HCT 43.8 35.3*  MCV 91.1 93.1  PLT 171 779*   Basic Metabolic Panel: Recent Labs  Lab 12/21/17 1407 12/22/17 0524  NA 145 143  K 3.9 3.6  CL 111 116*  CO2 27 24  GLUCOSE 156* 104*  BUN 31* 30*  CREATININE 1.04* 0.93  CALCIUM 9.3 8.3*   GFR: CrCl cannot be calculated (Unknown ideal weight.). Liver Function Tests: No results for input(s): AST, ALT, ALKPHOS, BILITOT, PROT, ALBUMIN in the last 168 hours. No results for input(s): LIPASE, AMYLASE in the last 168 hours. No results for input(s): AMMONIA in the last 168  hours. Coagulation Profile: No results for input(s): INR, PROTIME in the last 168 hours. Cardiac Enzymes: Recent Labs  Lab 12/21/17 1407 12/22/17 0524  CKTOTAL 3,063* 819*   BNP (last 3 results) No results for input(s): PROBNP in the last 8760 hours. HbA1C: No results for input(s): HGBA1C in the last 72 hours. CBG: No results for input(s): GLUCAP in the last 168 hours. Lipid Profile: No results for input(s): CHOL, HDL, LDLCALC, TRIG, CHOLHDL, LDLDIRECT in the last 72 hours. Thyroid Function Tests: No results for input(s): TSH, T4TOTAL, FREET4, T3FREE, THYROIDAB in the last 72 hours. Anemia Panel: No results for input(s): VITAMINB12, FOLATE, FERRITIN, TIBC, IRON, RETICCTPCT in the last 72 hours. Sepsis Labs: No results for input(s): PROCALCITON, LATICACIDVEN in the last 168 hours.  No results found for this or any previous visit (from the past 240 hour(s)).       Radiology Studies: Dg Thoracic Spine 2 View  Result Date: 12/21/2017 CLINICAL DATA:  Fall a few days ago with mid back pain. EXAM: THORACIC SPINE 2 VIEWS COMPARISON:  05/21/2016 FINDINGS: Mild curvature of the thoracic spine convex left unchanged. There are degenerative changes of the thoracic spine. Pedicles are intact. The lower thoracic spine is not well visualized on the lateral view due in part to the curvature. No definite acute compression fracture. IMPRESSION: No acute findings. Curvature of the thoracic spine convex left unchanged with degenerative changes present. Electronically Signed   By: Marin Olp M.D.   On: 12/21/2017 15:26   Dg Lumbar Spine Complete  Result Date: 12/21/2017 CLINICAL DATA:  Fall over the past couple days with low back pain. EXAM: LUMBAR SPINE - COMPLETE 4+ VIEW COMPARISON:  05/21/2016 and CT 10/03/2012 FINDINGS: Moderate curvature of the lower thoracic/lumbar spine convex right unchanged. Moderate degenerative changes of the lumbar spine. Facet arthropathy. Disc disease at multiple  levels of the lumbar spine and lower thoracic spine with exception of the L4-5 level. Mild stable L3 compression fracture. Mild depression of the superior endplate of L4 new since 2014 although likely chronic. Mild-to-moderate anterior wedging of L1 which appears to be progressed since 2014 and likely chronic. Slight anterior wedging of T11 and T12 with mild progression 2014 although likely chronic. IMPRESSION: No definite acute findings. Stable L3 compression fracture. Interval progression of mild compression deformities of T11, T12 and L1 since 2014 and likely chronic. Depression of the superior endplate of L4 likely chronic. Moderate spondylosis of the lumbar spine to include facet arthropathy. Multilevel disc disease. Curvature convex right. Electronically Signed   By: Marin Olp M.D.   On: 12/21/2017 15:33   Ct Head Wo Contrast  Result Date: 12/22/2017 CLINICAL DATA:  Headache EXAM: CT HEAD WITHOUT CONTRAST TECHNIQUE: Contiguous axial images were obtained from the base of the skull through the vertex without intravenous contrast. COMPARISON:  None. FINDINGS: Brain: Image quality degraded by extensive motion. Multiple repeat images or performed. Hypodensity right parietal cortex, most likely due to acute infarct. Generalized atrophy with extensive chronic microvascular ischemic change throughout the white matter. Negative for hemorrhage. Vascular: Negative  for hyperdense vessel Skull: Negative Sinuses/Orbits: Paranasal sinuses clear.  Bilateral cataract surgery Other: None IMPRESSION: Hypodensity right parietal cortex most likely acute infarct. Mass lesion not excluded. MRI suggested. However, given the extensive motion on the current study, sedation will be required for the MRI Atrophy and extensive chronic microvascular ischemia in the white matter. Electronically Signed   By: Franchot Gallo M.D.   On: 12/22/2017 11:40   Dg Knee Complete 4 Views Left  Result Date: 12/21/2017 CLINICAL DATA:  Fall  over the past couple days with left knee pain. EXAM: LEFT KNEE - COMPLETE 4+ VIEW COMPARISON:  None. FINDINGS: Diffuse osteopenia. Mild osteoarthritic change. No acute fracture or dislocation. No significant joint effusion. IMPRESSION: No acute fracture. Electronically Signed   By: Marin Olp M.D.   On: 12/21/2017 15:40   Dg Knee Complete 4 Views Right  Result Date: 12/21/2017 CLINICAL DATA:  Fall over the past few days with right knee pain. EXAM: RIGHT KNEE - COMPLETE 4+ VIEW COMPARISON:  None. FINDINGS: Diffuse osteopenia. Total right knee arthroplasty intact and normally located. No acute fracture, dislocation or significant joint effusion. IMPRESSION: No acute findings. Electronically Signed   By: Marin Olp M.D.   On: 12/21/2017 15:41   Dg Hips Bilat W Or Wo Pelvis 3-4 Views  Result Date: 12/21/2017 CLINICAL DATA:  Fall over the past few days. EXAM: DG HIP (WITH OR WITHOUT PELVIS) 3-4V BILAT COMPARISON:  CT 10/03/2012 FINDINGS: Left hip arthroplasty intact with mild protrusio acetabuli. Mild diffuse osteopenia. Mild degenerative change of the right hip and moderate degenerate change of the spine. No acute fracture or dislocation. IMPRESSION: No acute fracture or dislocation. Mild degenerative change of the right hip. Left total of arthroplasty intact with mild protrusio acetabuli. Electronically Signed   By: Marin Olp M.D.   On: 12/21/2017 15:39        Scheduled Meds: .  stroke: mapping our early stages of recovery book   Does not apply Once  . aspirin  300 mg Rectal Daily   Or  . aspirin  325 mg Oral Daily  . enoxaparin (LOVENOX) injection  40 mg Subcutaneous Q24H  . feeding supplement (ENSURE ENLIVE)  237 mL Oral BID BM  . irbesartan  300 mg Oral q morning - 10a  . levothyroxine  88 mcg Oral QAC breakfast  . metoprolol tartrate  50 mg Oral BID   Continuous Infusions: . sodium chloride 125 mL/hr at 12/22/17 0618     LOS: 0 days        Aline August, MD [facility] Pager [phone]  If 7PM-7AM, please contact night-coverage [email or web address] Password [facility] 12/22/2017, 12:27 PM

## 2017-12-22 NOTE — Progress Notes (Signed)
RN SSS performed by Hydrographic surveyor.  Patient coughed during test.  Order for speech swallow eval placed per previous order.  Patient to remain NPO at this time.

## 2017-12-22 NOTE — Care Management Obs Status (Signed)
[place] NOTIFICATION   Patient Details  Name: Tiffany [name] MRN: [id number] Date of Birth: Nov 14, 1926   Medicare Observation Status Notification Given:  Yes    [name], RN 12/22/2017, 12:44 PM

## 2017-12-22 NOTE — Progress Notes (Signed)
Physical therapist noticed that patient seemed to have a cut in her field of vision on her left side. Checked BP (136/89). Doesn't seem to have any other deficits on that side. I talked with [name], her son and he stated that he is not sure if she has had difficulty seeing on that side or not but that she had cataracts in the past. Patient does wear glasses, that she doesn't have with her. Notified Dr.Alekh and he ordered CT.

## 2017-12-22 NOTE — Progress Notes (Signed)
CSW following to assist with disposition- planning for SNF admission for short term rehab. Pt had preferred to go to [facility]- however her Bernadene Person is out of network with this facility. Son states he will consider whether they would want to pursue this or decide to choose a facility in-network. CSW provided so with list of SNF bed offers. He states he will make visits to the facilities in order to make selections. Son understanding of [facility] prior authorization requirement for SNF which can take up to several days.  Son notes plan is for pt to transfer to [facility]. CSW explained SNF placement will be followed there.  Sharren Bridge, MSW, LCSW Clinical Social Work 12/22/2017 [phone]

## 2017-12-22 NOTE — Progress Notes (Signed)
Patient received as transfer from [facility].  Agree with previous RN's assessment of patient.  NIH stroke performed, patient's VSS, and telemetry placed on patient and confirmed with CMT.

## 2017-12-22 NOTE — Evaluation (Signed)
Physical Therapy Evaluation Patient Details Name: Tiffany [name] MRN: [id number] DOB: August 30, 1926 Today's Date: 12/22/2017   History of Present Illness  Tiffany [name] is a 84 y.o. female with medical history significant of HTN, hypothyroidism who presents after fall at her independent living facility.  Clinical Impression  Patient presents with decreased independence with mobility due to weakness, decreased deficit awareness, L inattention, decreased balance, decreased sensation and poor activity tolerance.  She will benefit from skilled PT in the acute setting to maximize mobility prior to d/c to STSNF level rehab.     Follow Up Recommendations SNF;Supervision/Assistance - 24 hour    Equipment Recommendations  None recommended by PT    Recommendations for Other Services       Precautions / Restrictions Precautions Precautions: Fall Restrictions Weight Bearing Restrictions: No      Mobility  Bed Mobility Overal bed mobility: Needs Assistance Bed Mobility: Rolling;Sidelying to Sit Rolling: Mod assist Sidelying to sit: Mod assist;HOB elevated       General bed mobility comments: cues for technique due to back pain, difficulty for follow through with technique without mod verbal and tactile cues  Transfers Overall transfer level: Needs assistance Equipment used: Rolling walker (2 wheeled) Transfers: Sit to/from [facility] Sit to Stand: Mod assist Stand pivot transfers: Mod assist;Max assist       General transfer comment: cues and assist for technique, lifting assist and max A for stepping to [facility] due to LE incoordination and disorientation unable to locate BSC on L side; min to mod for step back to bed on R side  Ambulation/Gait                Stairs            Wheelchair Mobility    Modified Rankin (Stroke Patients Only) Modified Rankin (Stroke Patients Only) Pre-Morbid Rankin Score: No significant disability Modified Rankin:  Moderately severe disability     Balance Overall balance assessment: Needs assistance   Sitting balance-Leahy Scale: Good     Standing balance support: Bilateral upper extremity supported Standing balance-Leahy Scale: Poor                               Pertinent Vitals/Pain Pain Assessment: Faces Faces Pain Scale: Hurts even more Pain Location: back pain with mobility Pain Descriptors / Indicators: Grimacing;Sore Pain Intervention(s): Monitored during session;Repositioned    Home Living Family/patient expects to be discharged to:: Private residence Living Arrangements: Alone   Type of Home: Independent living facility([facility])       Home Layout: One level Home Equipment: Grab bars - tub/shower;Grab bars - toilet;Shower seat;Cane - single point;Walker - 2 wheels      Prior Function Level of Independence: Independent with assistive device(s)         Comments: used cane normally     Hand Dominance        Extremity/Trunk Assessment   Upper Extremity Assessment Upper Extremity Assessment: Generalized weakness;LUE deficits/detail LUE Deficits / Details: moves WFL, but sluggish to use in function at times with L inattention    Lower Extremity Assessment Lower Extremity Assessment: LLE deficits/detail LLE Deficits / Details: moves WFL to command, but difficulty using in function with stand step to [facility] with walker, noted incoordiation/confusion about location of BSC       Communication   Communication: No difficulties  Cognition Arousal/Alertness: Awake/alert Behavior During Therapy: WFL for tasks assessed/performed Overall Cognitive Status: Impaired/Different from  baseline Area of Impairment: Safety/judgement;Following commands                       Following Commands: Follows one step commands inconsistently Safety/Judgement: Decreased awareness of deficits;Decreased awareness of safety     General Comments: noted L side  inattention ? field cut      General Comments General comments (skin integrity, edema, etc.): RN made aware of symptoms with L inattention and possible visual field cut; in to assess pt and contact MD    Exercises     Assessment/Plan    PT Assessment Patient needs continued PT services  PT Problem List Decreased strength;Decreased mobility;Decreased safety awareness;Decreased balance;Decreased knowledge of use of DME;Impaired sensation;Pain;Decreased coordination       PT Treatment Interventions DME instruction;Therapeutic activities;Gait training;Therapeutic exercise;Patient/family education;Balance training;Functional mobility training    PT Goals (Current goals can be found in the Care Plan section)  Acute Rehab PT Goals Patient Stated Goal: to return home PT Goal Formulation: With patient Time For Goal Achievement: 01/05/18 Potential to Achieve Goals: Fair    Frequency Min 3X/week   Barriers to discharge        Co-evaluation               AM-PAC PT "6 Clicks" Daily Activity  Outcome Measure Difficulty turning over in bed (including adjusting bedclothes, sheets and blankets)?: Unable Difficulty moving from lying on back to sitting on the side of the bed? : Unable Difficulty sitting down on and standing up from a chair with arms (e.g., wheelchair, bedside commode, etc,.)?: Unable Help needed moving to and from a bed to chair (including a wheelchair)?: A Lot Help needed walking in hospital room?: Total Help needed climbing 3-5 steps with a railing? : Total 6 Click Score: 7    End of Session Equipment Utilized During Treatment: Gait belt Activity Tolerance: Patient limited by fatigue Patient left: with call bell/phone within reach;with bed alarm set;in bed;with nursing/sitter in room   PT Visit Diagnosis: Other abnormalities of gait and mobility (R26.89);History of falling (Z91.81);Other symptoms and signs involving the nervous system (R29.898)    Time:  0920-1000 PT Time Calculation (min) (ACUTE ONLY): 40 min   Charges:   PT Evaluation $PT Eval Moderate Complexity: 1 Mod PT Treatments $Therapeutic Activity: 23-37 mins        [place], [place] [phone] 12/22/2017   Reginia Naas 12/22/2017, 10:59 AM

## 2017-12-22 NOTE — Evaluation (Signed)
SLP Cancellation Note  Patient Details Name: Tiffany [name] MRN: [id number] DOB: 1927-05-02   Cancelled treatment:       Reason Eval/Treat Not Completed: Other (comment);Fatigue/lethargy limiting ability to participate(pt lethargic, did not awaken adequately for po, per son [name]- pt coughs with intake prior to admission - thus has h/o dysphagia, will likely benefit from [facility] prior to po diet given neuro event and premorbid dysphagia)   [name] 12/22/2017, 7:14 PM   Luanna Salk, [facility] SLP [phone]

## 2017-12-22 NOTE — Consult Note (Addendum)
Neurology Consultation Reason for Consult: Stroke Referring Physician: Cyndi Bender  CC: [place]  History is obtained from: Patient, son  HPI: Tiffany [name] is a 84 y.o. female with a history of hypertension who presents with a fall on Thursday.  She has had poor balance since that time, feeling generally weak.  Due to that she was brought into the emergency department on the 18th and has had an MRI revealing a right parietal stroke.  She is not aware that she has significantly more weakness on the left than the right.  She just thinks that she lost her balance.   LKW: Thursday tpa given?: no, out of window Premorbid modified rankin scale: 0    ROS: A 14 point ROS was performed and is negative except as noted in the HPI.  Past Medical History:  Diagnosis Date  . Arthritis   . Breast cancer ([place])   . Colon polyps   . Hypertension   . Hypothyroidism   . Osteoporosis   . Rectal bleeding   . Skin cancer      Family History  Problem Relation Age of Onset  . Glaucoma Mother   . Coronary artery disease Father   . Diabetes Sister   . Diabetes Brother      Social History:  reports that she has never smoked. She has never used smokeless tobacco. She reports that she does not drink alcohol or use drugs.   Exam: Current vital signs: BP 132/87 (BP Location: Left Leg)   Pulse 89   Temp 99.1 F (37.3 C) (Oral)   Resp 18   Ht 5\' 1"  (1.549 m)   Wt 71.8 kg   SpO2 100%   BMI 29.91 kg/m  Vital signs in last 24 hours: Temp:  [98.6 F (37 C)-99.1 F (37.3 C)] 99.1 F (37.3 C) (08/19 2201) Pulse Rate:  [56-89] 89 (08/19 2201) Resp:  [16-22] 18 (08/19 2201) BP: (132-158)/(55-134) 132/87 (08/19 2201) SpO2:  [98 %-100 %] 100 % (08/19 2201) Weight:  [71.8 kg] 71.8 kg (08/19 1453)   Physical Exam  Constitutional: Appears well-developed and well-nourished.  Psych: Affect appropriate to situation Eyes: No scleral injection HENT: No OP obstrucion Head: Normocephalic.   Cardiovascular: Normal rate and regular rhythm.  Respiratory: Effort normal, non-labored breathing GI: Soft.  No distension. There is no tenderness.  Skin: WDI  Neuro: Mental Status: Patient is awake, alert, interactive and appropriate Patient is able to give a clear and coherent history. No signs of aphasia  Cranial Nerves: II: Left hemianopia pupils are equal, round, and reactive to light.   III,IV, VI: EOMI without ptosis or diploplia.  V: Facial sensation is symmetric to temperature VII: Facial movement with mild left facial weakness VIII: hearing is intact to voice X: Uvula elevates symmetrically XI: Shoulder shrug is symmetric. XII: tongue is midline without atrophy or fasciculations.  Motor: Tone is normal. Bulk is normal.  She has full strength on the right, on the left she has 4/5 strength of the left arm and 3/5 strength of the left leg Sensory: Though she endorses symmetric sensation I suspect that she has mildly decreased sensation on the left, she does not extinguish to double simultaneous stimulation but does sometimes since touches to the left is happening on the right Cerebellar: No clear ataxia on the right   I have reviewed labs in epic and the results pertinent to this consultation are: Normal creatinine  I have reviewed the images obtained: MRI brain cortically based infarct in  the right parietal lobe  Impression: 84 year old female with embolic appearing stroke in the right parietal lobe.  Possibilities include artery to artery (though no significant stenosis on carotids), large vessel atherosclerosis, cardioembolic stroke.  In her age group, I think that atrial fibrillation is a very likely possibility.  She will need further evaluation and therapy.  Recommendations: - HgbA1c, fasting lipid panel - MRA  of the brain without contrast - Frequent neuro checks - Echocardiogram - Prophylactic therapy-Antiplatelet med: Aspirin - dose 325mg  PO or 300mg  PR -  Risk factor modification - Telemetry monitoring - PT consult, OT consult, Speech consult - Stroke team to follow  Roland Rack, MD [facility] [phone]  If 7pm- 7am, please page neurology on call as listed in [place].

## 2017-12-22 NOTE — NC FL2 (Signed)
[place] LEVEL OF CARE SCREENING TOOL     IDENTIFICATION  Patient Name: Tiffany [name] Birthdate: November 29, 1926 Sex: female Admission Date (Current Location): 12/21/2017  [facility] and [place] Number:  Herbalist and Address:  [facility],  [place] [address] [place]      Provider Number: [id number]  Attending Physician Name and Address:  Aline August, MD  Relative Name and Phone Number:       Current Level of Care: Hospital Recommended Level of Care: [place] Prior Approval Number:    Date Approved/Denied:   PASRR Number: [id number]  Discharge Plan: SNF    Current Diagnoses: Patient Active Problem List   Diagnosis Date Noted  . Fall 12/21/2017  . Generalized weakness 12/21/2017  . Rhabdomyolysis 12/21/2017  . CKD (chronic kidney disease) stage 3, GFR 30-59 ml/min (HCC) 12/21/2017  . Hypothyroidism 12/21/2017  . Palpitations 12/17/2012  . Internal bleeding hemorrhoids 10/26/2012  . Rectal bleeding 10/04/2012  . HTN (hypertension) 10/04/2012    Orientation RESPIRATION BLADDER Height & Weight     Self, Time, Situation, Place  Normal Continent Weight:   Height:     BEHAVIORAL SYMPTOMS/MOOD NEUROLOGICAL BOWEL NUTRITION STATUS      Continent Diet(regular diet)  AMBULATORY STATUS COMMUNICATION OF NEEDS Skin   Extensive Assist Verbally Normal                       Personal Care Assistance Level of Assistance  Bathing, Feeding, Dressing Bathing Assistance: Limited assistance Feeding assistance: Independent Dressing Assistance: Limited assistance     Functional Limitations Info  Sight, Hearing, Speech Sight Info: Adequate Hearing Info: Adequate Speech Info: Adequate    SPECIAL CARE FACTORS FREQUENCY  PT (By licensed PT), OT (By licensed OT)     PT Frequency: 5x OT Frequency: 5x            Contractures Contractures Info: Not present    Additional Factors Info  Code Status,  Allergies Code Status Info: DNR Allergies Info: Codeine, Morphine And Related, Sulfa Antibiotics           Current Medications (12/22/2017):  This is the current hospital active medication list Current Facility-Administered Medications  Medication Dose Route Frequency Provider Last Rate Last Dose  . 0.9 %  sodium chloride infusion   Intravenous Continuous Dessa Phi, DO 125 mL/hr at 12/22/17 0618    . acetaminophen (TYLENOL) tablet 650 mg  650 mg Oral Q6H PRN Dessa Phi, DO       Or  . acetaminophen (TYLENOL) suppository 650 mg  650 mg Rectal Q6H PRN Dessa Phi, DO      . enoxaparin (LOVENOX) injection 40 mg  40 mg Subcutaneous Q24H Dessa Phi, DO   40 mg at 12/21/17 2107  . feeding supplement (ENSURE ENLIVE) (ENSURE ENLIVE) liquid 237 mL  237 mL Oral BID BM Dessa Phi, DO   237 mL at 12/22/17 1132  . HYDROcodone-acetaminophen (NORCO/VICODIN) 5-325 MG per tablet 1-2 tablet  1-2 tablet Oral Q4H PRN Dessa Phi, DO   1 tablet at 12/22/17 0116  . irbesartan (AVAPRO) tablet 300 mg  300 mg Oral q morning - 10a Dessa Phi, DO   300 mg at 12/22/17 1132  . levothyroxine (SYNTHROID, LEVOTHROID) tablet 88 mcg  88 mcg Oral QAC breakfast Dessa Phi, DO   88 mcg at 12/22/17 0801  . metoprolol tartrate (LOPRESSOR) tablet 50 mg  50 mg Oral BID Dessa Phi, DO   50  mg at 12/22/17 1132  . ondansetron (ZOFRAN) tablet 4 mg  4 mg Oral Q6H PRN Dessa Phi, DO       Or  . ondansetron [facility]) injection 4 mg  4 mg Intravenous Q6H PRN Dessa Phi, DO      . polyethylene glycol (MIRALAX / GLYCOLAX) packet 17 g  17 g Oral Daily PRN Dessa Phi, DO         Discharge Medications: Please see discharge summary for a list of discharge medications.  Relevant Imaging Results:  Relevant Lab Results:   Additional Information SS# [id number]  Nila Nephew, LCSW

## 2017-12-22 NOTE — Progress Notes (Signed)
Carotid duplex prelim: 1-39% ICA stenosis.  Taiya Nutting Eunice, RDMS, RVT   

## 2017-12-23 ENCOUNTER — Inpatient Hospital Stay (HOSPITAL_COMMUNITY): Payer: Medicare HMO

## 2017-12-23 DIAGNOSIS — I6340 Cerebral infarction due to embolism of unspecified cerebral artery: Secondary | ICD-10-CM

## 2017-12-23 DIAGNOSIS — W19XXXS Unspecified fall, sequela: Secondary | ICD-10-CM

## 2017-12-23 DIAGNOSIS — I6310 Cerebral infarction due to embolism of unspecified precerebral artery: Secondary | ICD-10-CM

## 2017-12-23 DIAGNOSIS — W19XXXA Unspecified fall, initial encounter: Secondary | ICD-10-CM

## 2017-12-23 DIAGNOSIS — I361 Nonrheumatic tricuspid (valve) insufficiency: Secondary | ICD-10-CM

## 2017-12-23 LAB — LIPID PANEL
Cholesterol: 142 mg/dL (ref 0–200)
HDL: 43 mg/dL (ref >40)
LDL Cholesterol: 83 mg/dL (ref 0–99)
Total CHOL/HDL Ratio: 3.3 RATIO
Triglycerides: 81 mg/dL (ref <150)
VLDL: 16 mg/dL (ref 0–40)

## 2017-12-23 LAB — ECHOCARDIOGRAM COMPLETE
Height: 62 in
Weight: 2502.4 oz

## 2017-12-23 LAB — URINE CULTURE

## 2017-12-23 LAB — HEMOGLOBIN A1C
Hgb A1c MFr Bld: 6 % — ABNORMAL HIGH (ref 4.8–5.6)
Mean Plasma Glucose: 125.5 mg/dL

## 2017-12-23 MED ORDER — METOPROLOL TARTRATE 50 MG PO TABS
50.0000 mg | ORAL_TABLET | Freq: Two times a day (BID) | ORAL | Status: DC
  Administered 2017-12-23 – 2017-12-26 (×7): 50 mg via ORAL
  Filled 2017-12-23 (×7): qty 1

## 2017-12-23 MED ORDER — ASPIRIN EC 81 MG PO TBEC
81.0000 mg | DELAYED_RELEASE_TABLET | Freq: Every day | ORAL | Status: DC
  Administered 2017-12-23 – 2017-12-26 (×4): 81 mg via ORAL
  Filled 2017-12-23 (×4): qty 1

## 2017-12-23 MED ORDER — CLOPIDOGREL BISULFATE 75 MG PO TABS
75.0000 mg | ORAL_TABLET | Freq: Every day | ORAL | Status: DC
  Administered 2017-12-23 – 2017-12-26 (×4): 75 mg via ORAL
  Filled 2017-12-23 (×4): qty 1

## 2017-12-23 MED ORDER — IRBESARTAN 300 MG PO TABS
300.0000 mg | ORAL_TABLET | Freq: Every morning | ORAL | Status: DC
  Administered 2017-12-23 – 2017-12-26 (×4): 300 mg via ORAL
  Filled 2017-12-23 (×4): qty 1

## 2017-12-23 NOTE — Progress Notes (Addendum)
STROKE TEAM PROGRESS NOTE  HPI:( Dr Leonel Ramsay ) Tiffany [name] is a 84 y.o. female with a history of hypertension who presents with a fall on Thursday.  She has had poor balance since that time, feeling generally weak.  Due to that she was brought into the emergency department on the 18th and has had an MRI revealing a right parietal stroke. She is not aware that she has significantly more weakness on the left than the right.  She just thinks that she lost her balance. LKW: Thursday 12/18/17 tpa given?: no, out of window Premorbid modified rankin scale: 0 INTERVAL HISTORY No family is at the bedside.  She remains stable. Asking for her son.  Vitals:   12/23/17 0008 12/23/17 0057 12/23/17 0645 12/23/17 0839  BP: (!) 150/77 (!) 174/70 (!) 113/95 (!) 165/111  Pulse: 68 66 72 76  Resp: 20 20 18 16   Temp: 98.6 F (37 C) 98.2 F (36.8 C) 98.9 F (37.2 C) 98.7 F (37.1 C)  TempSrc: Oral Oral Oral Oral  SpO2: 99% 98% 99% 96%  Weight:  70.9 kg    Height:  5\' 2"  (1.575 m)      CBC:  Recent Labs  Lab 12/21/17 1407 12/22/17 0524  WBC 12.4* 9.0  HGB 14.6 11.3*  HCT 43.8 35.3*  MCV 91.1 93.1  PLT 171 134*    Basic Metabolic Panel:  Recent Labs  Lab 12/21/17 1407 12/22/17 0524  NA 145 143  K 3.9 3.6  CL 111 116*  CO2 27 24  GLUCOSE 156* 104*  BUN 31* 30*  CREATININE 1.04* 0.93  CALCIUM 9.3 8.3*   Lipid Panel:     Component Value Date/Time   CHOL  07/06/2010 2138    192        ATP III CLASSIFICATION:  <200     mg/dL   Desirable  200-239  mg/dL   Borderline High  >=240    mg/dL   High          TRIG 54 07/06/2010 2138   HDL 102 07/06/2010 2138   CHOLHDL 1.9 07/06/2010 2138   VLDL 11 07/06/2010 2138   LDLCALC  07/06/2010 2138    79        Total Cholesterol/HDL:CHD Risk Coronary Heart Disease Risk Table                     Men   Women  1/2 Average Risk   3.4   3.3  Average Risk       5.0   4.4  2 X Average Risk   9.6   7.1  3 X Average Risk  23.4   11.0         Use the calculated Patient Ratio above and the CHD Risk Table to determine the patient's CHD Risk.        ATP III CLASSIFICATION (LDL):  <100     mg/dL   Optimal  100-129  mg/dL   Near or Above                    Optimal  130-159  mg/dL   Borderline  160-189  mg/dL   High  >190     mg/dL   Very High   HgbA1c:  Lab Results  Component Value Date   HGBA1C (H) 07/06/2010    6.9 (NOTE)  According to the ADA Clinical Practice Recommendations for 2011, when HbA1c is used as a screening test:   >=6.5%   Diagnostic of Diabetes Mellitus           (if abnormal result  is confirmed)  5.7-6.4%   Increased risk of developing Diabetes Mellitus  References:Diagnosis and Classification of Diabetes Mellitus,Diabetes YDXA,1287,86([facility] 1):S62-S69 and Standards of Medical Care in         Diabetes - 2011,Diabetes NOBS,9628,36  (Suppl 1):S11-S61.   Urine Drug Screen: No results found for: LABOPIA, COCAINSCRNUR, LABBENZ, AMPHETMU, THCU, LABBARB  Alcohol Level No results found for: [place] Dg Thoracic Spine 2 View  Result Date: 12/21/2017 CLINICAL DATA:  Fall a few days ago with mid back pain. EXAM: THORACIC SPINE 2 VIEWS COMPARISON:  05/21/2016 FINDINGS: Mild curvature of the thoracic spine convex left unchanged. There are degenerative changes of the thoracic spine. Pedicles are intact. The lower thoracic spine is not well visualized on the lateral view due in part to the curvature. No definite acute compression fracture. IMPRESSION: No acute findings. Curvature of the thoracic spine convex left unchanged with degenerative changes present. Electronically Signed   By: Marin Olp M.D.   On: 12/21/2017 15:26   Dg Lumbar Spine Complete  Result Date: 12/21/2017 CLINICAL DATA:  Fall over the past couple days with low back pain. EXAM: LUMBAR SPINE - COMPLETE 4+ VIEW COMPARISON:  05/21/2016 and CT 10/03/2012 FINDINGS: Moderate curvature  of the lower thoracic/lumbar spine convex right unchanged. Moderate degenerative changes of the lumbar spine. Facet arthropathy. Disc disease at multiple levels of the lumbar spine and lower thoracic spine with exception of the L4-5 level. Mild stable L3 compression fracture. Mild depression of the superior endplate of L4 new since 2014 although likely chronic. Mild-to-moderate anterior wedging of L1 which appears to be progressed since 2014 and likely chronic. Slight anterior wedging of T11 and T12 with mild progression 2014 although likely chronic. IMPRESSION: No definite acute findings. Stable L3 compression fracture. Interval progression of mild compression deformities of T11, T12 and L1 since 2014 and likely chronic. Depression of the superior endplate of L4 likely chronic. Moderate spondylosis of the lumbar spine to include facet arthropathy. Multilevel disc disease. Curvature convex right. Electronically Signed   By: Marin Olp M.D.   On: 12/21/2017 15:33   Ct Head Wo Contrast  Result Date: 12/22/2017 CLINICAL DATA:  Headache EXAM: CT HEAD WITHOUT CONTRAST TECHNIQUE: Contiguous axial images were obtained from the base of the skull through the vertex without intravenous contrast. COMPARISON:  None. FINDINGS: Brain: Image quality degraded by extensive motion. Multiple repeat images or performed. Hypodensity right parietal cortex, most likely due to acute infarct. Generalized atrophy with extensive chronic microvascular ischemic change throughout the white matter. Negative for hemorrhage. Vascular: Negative for hyperdense vessel Skull: Negative Sinuses/Orbits: Paranasal sinuses clear.  Bilateral cataract surgery Other: None IMPRESSION: Hypodensity right parietal cortex most likely acute infarct. Mass lesion not excluded. MRI suggested. However, given the extensive motion on the current study, sedation will be required for the MRI Atrophy and extensive chronic microvascular ischemia in the white matter.  Electronically Signed   By: Franchot Gallo M.D.   On: 12/22/2017 11:40   Mr Brain Wo Contrast  Result Date: 12/22/2017 CLINICAL DATA:  Recent fall. Found down this morning. Follow up abnormal head CT. History of breast cancer, hypertension. EXAM: MRI HEAD WITHOUT CONTRAST TECHNIQUE: Multiplanar, multiecho pulse sequences of the brain and surrounding structures were obtained without intravenous contrast. Axial  T1 and coronal T2 sequences not obtained due to patient motion; patient removed head coil and could not continue examination. COMPARISON:  CT HEAD December 22, 2016 FINDINGS: INTRACRANIAL CONTENTS: Patchy reduced diffusion RIGHT parietal lobe, periventricular white matter and posterior insula with corresponding low ADC values. No susceptibility artifact to suggest hemorrhage. No midline shift, mass effect or definite masses though limited by motion. Confluent supratentorial and patchy pontine white matter FLAIR T2 hyperintensities. Prominent basal ganglia and thalami perivascular spaces in hazy T2 hyperintense signal so seen with chronic small vessel ischemic changes. No parenchymal brain volume loss for age. No hydrocephalus. No abnormal extra-axial fluid collections. VASCULAR: Major intracranial vascular flow voids present at skull base. Dolichoectatic intracranial vessels associated with chronic hypertension. SKULL AND UPPER CERVICAL SPINE: No abnormal sellar expansion. No suspicious calvarial bone marrow signal. Craniocervical junction maintained. SINUSES/ORBITS: The mastoid air-cells and included paranasal sinuses are well-aerated.The included ocular globes and orbital contents are non-suspicious. Status post bilateral ocular lens implants. OTHER: None. IMPRESSION: 1. Moderately motion degraded truncated examination. 2. Moderate RIGHT MCA territory nonhemorrhagic infarct corresponding to CT abnormality. 3. Severe chronic small vessel ischemic changes. Electronically Signed   By: Elon Alas M.D.    On: 12/22/2017 14:04   Dg Knee Complete 4 Views Left  Result Date: 12/21/2017 CLINICAL DATA:  Fall over the past couple days with left knee pain. EXAM: LEFT KNEE - COMPLETE 4+ VIEW COMPARISON:  None. FINDINGS: Diffuse osteopenia. Mild osteoarthritic change. No acute fracture or dislocation. No significant joint effusion. IMPRESSION: No acute fracture. Electronically Signed   By: Marin Olp M.D.   On: 12/21/2017 15:40   Dg Knee Complete 4 Views Right  Result Date: 12/21/2017 CLINICAL DATA:  Fall over the past few days with right knee pain. EXAM: RIGHT KNEE - COMPLETE 4+ VIEW COMPARISON:  None. FINDINGS: Diffuse osteopenia. Total right knee arthroplasty intact and normally located. No acute fracture, dislocation or significant joint effusion. IMPRESSION: No acute findings. Electronically Signed   By: Marin Olp M.D.   On: 12/21/2017 15:41   Dg Hips Bilat W Or Wo Pelvis 3-4 Views  Result Date: 12/21/2017 CLINICAL DATA:  Fall over the past few days. EXAM: DG HIP (WITH OR WITHOUT PELVIS) 3-4V BILAT COMPARISON:  CT 10/03/2012 FINDINGS: Left hip arthroplasty intact with mild protrusio acetabuli. Mild diffuse osteopenia. Mild degenerative change of the right hip and moderate degenerate change of the spine. No acute fracture or dislocation. IMPRESSION: No acute fracture or dislocation. Mild degenerative change of the right hip. Left total of arthroplasty intact with mild protrusio acetabuli. Electronically Signed   By: Marin Olp M.D.   On: 12/21/2017 15:39   Carotid Doppler   There is 1-39% bilateral ICA stenosis. Vertebral artery flow is antegrade.   2D Echocardiogram  pending    PHYSICAL EXAM Pleasant elderly Caucasian lady not in distress. . Afebrile. Head is nontraumatic. Neck is supple without bruit.    Cardiac exam no murmur or gallop. Lungs are clear to auscultation. Distal pulses are well felt. Neurological Exam ;  Awake  Alert oriented x 3. Normal speech and language.eye movements  full without nystagmus.fundi were not visualized. Vision acuity and fields appear normal. Hearing is normal. Palatal movements are normal. Face symmetric. Tongue midline. Normal strength, tone, reflexes and coordination. Normal sensation. Gait deferred.  ASSESSMENT/PLAN Ms. Tiffany [name] is a 84 y.o. female with history of HTN and recent fall w/ hx frequent falls d/t hip replacements presenting with poor balance. MRI shows a R parietal  infarct.   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 Small vessel disease. Atrophy.   MRI  R MCA INFARCT. Small vessel disease.   MRA  pending   Carotid Doppler  B ICA 1-39% stenosis, VAs antegrade    2D Echo  pending   Not an anticoagulation candidate d/t hx frequent falls, so will not pursue TEE and loop  LDL pending   HgbA1c pending   Lovenox 40 mg sq daily for VTE prophylaxis  No antithrombotic prior to admission, now on aspirin 325 mg daily. Given mild stroke, will place on aspirin 81 mg and plavix 75 mg daily x 3 weeks, then aspirin alone. Orders adjusted.   Therapy recommendations:  SNF  Disposition:  pending  (lives at [facility])  Hypertension  Stable . Permissive hypertension (OK if < 220/120) but gradually normalize in 5-7 days . Long-term BP goal normotensive  Other Stroke Risk Factors  Advanced age  Other Active Problems  Fall, mild rhoabdomylosis  CKD stage 3  Hypothyroidism   Hospital day # 1  Burnetta Sabin, MSN, APRN, ANVP-BC, AGPCNP-BC Advanced Practice Stroke Nurse [place] for Schedule & Pager information 12/23/2017 10:27 AM  I have personally examined this patient, reviewed notes, independently viewed imaging studies, participated in medical decision making and plan of care.ROS completed by me personally and pertinent positives fully documented  I have made any additions or  clarifications directly to the above note. Agree with note above. She presented with transient left hand weakness secondary to embolic right MCA branch infarct. Patient appears to be a fall risk and is elderly and may not be the best candidate for long-term anticoagulation. We will need to discuss this with her family. Check ongoing stroke workup. Recommend dual antiplatelet therapy for 3 weeks followed by single agent. Greater than 50% time during this 35 minute visit was spent on counseling and coordination of care of an embolic stroke and discussion about evaluation and treatment plan and answered questions  Antony Contras, MD Medical Director [place] Pager: [phone] 12/23/2017 1:33 PM   To contact Stroke Continuity provider, please refer to [email or web address] After hours, contact General Neurology

## 2017-12-23 NOTE — Care Management Note (Signed)
Case Management Note  Patient Details  Name: Tiffany [name] MRN: [id number] Date of Birth: 11/10/1926  Subjective/Objective:      Pt admitted with CVA and a fall. She is from [place].  PCP: Dr Nancy Fetter Insurance: [place] medicare              Action/Plan: PT/OT recommending SNF. CM following for d/c disposition.  Expected Discharge Date:                  Expected Discharge Plan:  [place]  In-House Referral:     Discharge planning Services  CM Consult  Post Acute Care Choice:    Choice offered to:     DME Arranged:    DME Agency:     HH Arranged:    [place] Agency:     Status of Service:  In process, will continue to follow  If discussed at Long Length of Stay Meetings, dates discussed:    Additional Comments:  Pollie Friar, RN 12/23/2017, 2:12 PM

## 2017-12-23 NOTE — Progress Notes (Signed)
  Echocardiogram 2D Echocardiogram has been performed.  Tiffany [name] 12/23/2017, 4:12 PM

## 2017-12-23 NOTE — Progress Notes (Signed)
Report called and given to receiving RN at [facility] arrived to transport patient to [facility]. Patient awake, alert, and stable. Son at bedside.

## 2017-12-23 NOTE — Evaluation (Signed)
Occupational Therapy Evaluation Patient Details Name: Tiffany [name] MRN: [id number] DOB: 1926/09/13 Today's Date: 12/23/2017    History of Present Illness 84 y.o. female s/p fall at independent living. MRI+ R MCA infarct PMH HTN, hypothyroidism   Clinical Impression   PT admitted with R MCA infarct. Pt currently with functional limitiations due to the deficits listed below (see OT problem list). Pt currently requires mod (A) with RW for basic transfer. Recommend RN staff use BSC with stand pivot due to fall risk. Pt with L inattention and will need cues to attend L. Drinks / call bells should be left on R side.  Pt will benefit from skilled OT to increase their independence and safety with adls and balance to allow discharge SNF. pt currently requires higher level care for basic transfers that independent living can not provide..     Follow Up Recommendations  SNF    Equipment Recommendations  3 in 1 bedside commode;Wheelchair (measurements OT);Wheelchair cushion (measurements OT);Hospital bed    Recommendations for Other Services       Precautions / Restrictions Precautions Precautions: Fall      Mobility Bed Mobility Overal bed mobility: Needs Assistance Bed Mobility: Supine to Sit Rolling: Mod assist   Supine to sit: Mod assist     General bed mobility comments: pt with HOB elevated and requires (A) to elevate from bed surface  Transfers Overall transfer level: Needs assistance Equipment used: Rolling walker (2 wheeled) Transfers: Sit to/from Stand Sit to Stand: Min assist         General transfer comment: cues for hands and use of RW    Balance Overall balance assessment: Needs assistance   Sitting balance-Leahy Scale: Good     Standing balance support: Bilateral upper extremity supported;During functional activity Standing balance-Leahy Scale: Poor                             ADL either performed or assessed with clinical judgement    ADL Overall ADL's : Needs assistance/impaired Eating/Feeding: Set up;Sitting Eating/Feeding Details (indicate cue type and reason): pt with apple sauce and ensure present on table but was unaware because it was in L visual field Grooming: Wash/dry hands;Standing Grooming Details (indicate cue type and reason): pt requires cues to turn water on and off. pt needs cues to use soap. pt was asking to complete these task but requires cues to meet the needs to sequence the full task Upper Body Bathing: Moderate assistance   Lower Body Bathing: Maximal assistance   Upper Body Dressing : Moderate assistance   Lower Body Dressing: Maximal assistance   Toilet Transfer: Minimal assistance;RW           Functional mobility during ADLs: Moderate assistance;Rolling walker General ADL Comments: pt transfered from bed to sink with min (A) pt with navigating around the bed buckled R LE - yells out and released RW "i am falling" with max (A) to correct. pt repositioned in RW and progressed to chair level     Vision   Vision Assessment?: Vision impaired- to be further tested in functional context Additional Comments: pt with L inattention during session. Neurology notes state " L hemianopsia" - OT to further assess vision.     Perception Perception Perception Tested?: Yes Perception Deficits: Inattention/neglect Inattention/Neglect: Does not attend to left visual field   Praxis      Pertinent Vitals/Pain Pain Assessment: Faces Faces Pain Scale: Hurts even more Pain Location: R  hip- states "but honestly all over" Pain Descriptors / Indicators: Grimacing;Sore Pain Intervention(s): Monitored during session;Repositioned     Hand Dominance Right   Extremity/Trunk Assessment Upper Extremity Assessment Upper Extremity Assessment: Generalized weakness   Lower Extremity Assessment Lower Extremity Assessment: Defer to PT evaluation   Cervical / Trunk Assessment Cervical / Trunk Assessment:  Kyphotic   Communication Communication Communication: No difficulties   Cognition Arousal/Alertness: Awake/alert Behavior During Therapy: WFL for tasks assessed/performed Overall Cognitive Status: Impaired/Different from baseline Area of Impairment: Memory;Following commands;Safety/judgement;Awareness                     Memory: Decreased short-term memory Following Commands: Follows one step commands inconsistently Safety/Judgement: Decreased awareness of deficits;Decreased awareness of safety Awareness: Intellectual   General Comments: L inattention/ pt perseverating on washing hands/ pt repeating herself several times during session without awareness   General Comments       Exercises     Shoulder Instructions      Home Living Family/patient expects to be discharged to:: Private residence Living Arrangements: Alone                               Additional Comments: from independent living facility      Prior Functioning/Environment Level of Independence: Independent with assistive device(s)        Comments: used a cane        OT Problem List: Decreased strength;Decreased activity tolerance;Impaired balance (sitting and/or standing);Impaired vision/perception;Decreased coordination;Decreased cognition;Decreased safety awareness;Decreased knowledge of use of DME or AE;Decreased knowledge of precautions;Impaired UE functional use;Obesity;Pain      OT Treatment/Interventions: Self-care/ADL training;Therapeutic exercise;DME and/or AE instruction;Manual therapy;Neuromuscular education;Therapeutic activities;Cognitive remediation/compensation;Visual/perceptual remediation/compensation;Patient/family education;Balance training    OT Goals(Current goals can be found in the care plan section) Acute Rehab OT Goals Patient Stated Goal: none stated OT Goal Formulation: Patient unable to participate in goal setting Time For Goal Achievement:  01/06/18 Potential to Achieve Goals: Good  OT Frequency: Min 2X/week   Barriers to D/C: Decreased caregiver support          Co-evaluation              AM-PAC PT "6 Clicks" Daily Activity     Outcome Measure Help from another person eating meals?: A Little Help from another person taking care of personal grooming?: A Little Help from another person toileting, which includes using toliet, bedpan, or urinal?: A Lot Help from another person bathing (including washing, rinsing, drying)?: A Lot Help from another person to put on and taking off regular upper body clothing?: A Little Help from another person to put on and taking off regular lower body clothing?: A Lot 6 Click Score: 15   End of Session Equipment Utilized During Treatment: Gait belt;Rolling walker Nurse Communication: Mobility status;Precautions  Activity Tolerance: Patient tolerated treatment well Patient left: in chair;with call bell/phone within reach;with chair alarm set  OT Visit Diagnosis: Unsteadiness on feet (R26.81);Muscle weakness (generalized) (M62.81);Repeated falls (R29.6)                Time: 2122-4825 OT Time Calculation (min): 16 min Charges:  OT General Charges $OT Visit: 1 Visit OT Evaluation $OT Eval Moderate Complexity: 1 Mod   Jeri Modena   OTR/L Pager: [phone] Office: [phone] .   Parke Poisson B 12/23/2017, 1:42 PM

## 2017-12-23 NOTE — Evaluation (Signed)
Clinical/Bedside Swallow Evaluation Patient Details  Name: Tiffany [name] MRN: [id number] Date of Birth: [birth year]  Today's Date: 12/23/2017 Time: SLP Start Time (ACUTE ONLY): 0902 SLP Stop Time (ACUTE ONLY): 9211 SLP Time Calculation (min) (ACUTE ONLY): 20 min  Past Medical History:  Past Medical History:  Diagnosis Date  . Arthritis   . Breast cancer ([place])   . Colon polyps   . Hypertension   . Hypothyroidism   . Osteoporosis   . Rectal bleeding   . Skin cancer    Past Surgical History:  Past Surgical History:  Procedure Laterality Date  . ABDOMINAL HYSTERECTOMY    . BILATERAL TOTAL MASTECTOMY WITH AXILLARY LYMPH NODE DISSECTION    . CATARACT EXTRACTION    . CHOLECYSTECTOMY    . COLONOSCOPY N/A 10/07/2012   Procedure: COLONOSCOPY;  Surgeon: Arta Silence, MD;  Location: [facility] ENDOSCOPY;  Service: Endoscopy;  Laterality: N/A;  . hip replacment    . HIP SURGERY    . JOINT REPLACEMENT    . MASTECTOMY    . REPLACEMENT TOTAL KNEE    . shin repair     HPI:  Tiffany [name] is a 84 y.o. female with medical history significant of HTN, hypothyroidism, breast cancer who presents after fall at her independent living facility. MRI Moderate RIGHT MCA territory nonhemorrhagic infarct corresponding to CT abnormality.   Assessment / Plan / Recommendation Clinical Impression  Pt's son not present during assessment and pt's cognitive reliability is questionable. No focal lingual or labia/facial weakness, cough was strong. Tiny bites solid manipulated and transited without residue. Swallow initiation appeared functional however unable to fully evaluate at bedside. Vocal quality clear throughout assessment with thin, puree or solid; no cough. Pt's acute stroke and suspected dementia increase aspiration risk. Recommend regular texture, thin liquids, straws allowed, pills with thin or if large, whole in applesauce, full supervision initially. ST will continue to follow for safety and efficiency  with recommendations.  SLP Visit Diagnosis: Dysphagia, unspecified (R13.10)    Aspiration Risk  Mild aspiration risk;Moderate aspiration risk    Diet Recommendation Regular;Thin liquid   Liquid Administration via: Cup;Straw Medication Administration: Whole meds with liquid Supervision: Patient able to self feed;Full supervision/cueing for compensatory strategies Compensations: Slow rate;Small sips/bites;Minimize environmental distractions Postural Changes: Seated upright at 90 degrees    Other  Recommendations Oral Care Recommendations: Oral care BID   Follow up Recommendations 24 hour supervision/assistance      Frequency and Duration min 2x/week  2 weeks       Prognosis Prognosis for Safe Diet Advancement: Good Barriers to Reach Goals: Cognitive deficits      Swallow Study   General HPI: Tiffany [name] is a 84 y.o. female with medical history significant of HTN, hypothyroidism, breast cancer who presents after fall at her independent living facility. MRI Moderate RIGHT MCA territory nonhemorrhagic infarct corresponding to CT abnormality. Type of Study: Bedside Swallow Evaluation Previous Swallow Assessment: (none) Diet Prior to this Study: NPO Temperature Spikes Noted: No Respiratory Status: Room air History of Recent Intubation: No Behavior/Cognition: Alert;Cooperative;Pleasant mood;Distractible;Requires cueing Oral Cavity Assessment: Dry Oral Care Completed by SLP: No Oral Cavity - Dentition: Dentures, top;Dentures, bottom Vision: Functional for self-feeding Self-Feeding Abilities: Able to feed self Patient Positioning: Upright in bed Baseline Vocal Quality: Normal Volitional Cough: Strong Volitional Swallow: Able to elicit    Oral/Motor/Sensory Function Overall Oral Motor/Sensory Function: Within functional limits   Ice Chips Ice chips: Not tested   Thin Liquid Thin Liquid: Within functional limits Presentation: Cup;Straw  Nectar Thick Nectar Thick  Liquid: Not tested   Honey Thick Honey Thick Liquid: Not tested   Puree Puree: Within functional limits   Solid     Solid: Within functional limits      Houston Siren 12/23/2017,9:35 AM  Orbie Pyo Colvin Caroli.Ed [employer] [phone]

## 2017-12-23 NOTE — Progress Notes (Addendum)
PROGRESS NOTE    Tiffany [name]  URK:[id number] DOB: [birth year] DOA: 12/21/2017 PCP: Donald Prose, MD   Brief Narrative: Patient is 84 year old female with past medical history of hypertension, hypothyroidism who presented with fall and weakness from independent living facility.  She was found to have elevated CK.  X-rays were unremarkable for the fractures.  MRI of the brain showed acute ischemic stroke.  Neurology following.  PT/OT evaluation pending.  Assessment & Plan:   Principal Problem:   Fall Active Problems:   HTN (hypertension)   Generalized weakness   Rhabdomyolysis   CKD (chronic kidney disease) stage 3, GFR 30-59 ml/min (HCC)   Hypothyroidism   Pressure injury of skin   Cerebral embolism with cerebral infarction   Acute right-sided ischemic infarction: MRI of the brain showed moderate right MCA territory infarct.  Patient presented with bilateral lower extremity weakness more on the left.  Neurology following.  Continue aspirin.  Follow-up echocardiogram.  Carotid Dopplers did not show any significant carotid artery stenosis.  Hemoglobin A1c is 6.0. LDL of 83 .  Speech therapy evaluated  the patient.  Pending PT/OT evaluation.  Falls/generalized weakness/deconditioning: No acute fractures or injuries on the x-rays.  PT/OT following.  Rhabdomyolysis:Due to fall. CK level improving with IV fluids.  Leukocytosis: Most likely reactive.  Resolved  Thrombocytopenia: Likely chronic.  No signs of bleeding.  CKD stage III: Creatinine on baseline.  Hypertension: Noted to be hypertensive this morning.  Restart home medications.  Hypothyroidism: Continue Synthyroid.   DVT prophylaxis: Lovenox Code Status: DNR Family Communication: Son present at the bedside Disposition Plan: Likely skilled nursing facility after PT/OT evaluation.   Consultants: neurology  Procedures:MRI  Antimicrobials: None  Subjective: Patient seen and examined bedside this morning.   Hemodynamically stable.  Was in deep sleep.  Unable to wake up  Objective: Vitals:   12/23/17 0057 12/23/17 0645 12/23/17 0839 12/23/17 1150  BP: (!) 174/70 (!) 113/95 (!) 165/111 (!) 181/108  Pulse: 66 72 76 69  Resp: 20 18 16 16   Temp: 98.2 F (36.8 C) 98.9 F (37.2 C) 98.7 F (37.1 C) 98.7 F (37.1 C)  TempSrc: Oral Oral Oral Oral  SpO2: 98% 99% 96% 100%  Weight: 70.9 kg     Height: 5\' 2"  (1.575 m)       Intake/Output Summary (Last 24 hours) at 12/23/2017 1232 Last data filed at 12/23/2017 1231 Gross per 24 hour  Intake 1823.71 ml  Output -  Net 1823.71 ml   Filed Weights   12/22/17 1453 12/23/17 0057  Weight: 71.8 kg 70.9 kg    Examination:  General exam: Appears calm and comfortable ,Not in distress,average built HEENT:PERRL,Oral mucosa moist, Ear/Nose normal on gross exam Respiratory system: Bilateral equal air entry, normal vesicular breath sounds, no wheezes or crackles  Cardiovascular system: S1 & S2 heard, RRR. No JVD, murmurs, rubs, gallops or clicks. No pedal edema. Gastrointestinal system: Abdomen is nondistended, soft and nontender. No organomegaly or masses felt. Normal bowel sounds heard. Central nervous system: Could not examine, patient in deep sleep Extremities: No edema, no clubbing ,no cyanosis, distal peripheral pulses palpable. Skin: No rashes, lesions or ulcers,no icterus ,no pallor    Data Reviewed: I have personally reviewed following labs and imaging studies  CBC: Recent Labs  Lab 12/21/17 1407 12/22/17 0524  WBC 12.4* 9.0  HGB 14.6 11.3*  HCT 43.8 35.3*  MCV 91.1 93.1  PLT 171 831*   Basic Metabolic Panel: Recent Labs  Lab 12/21/17 1407 12/22/17 0524  NA 145 143  K 3.9 3.6  CL 111 116*  CO2 27 24  GLUCOSE 156* 104*  BUN 31* 30*  CREATININE 1.04* 0.93  CALCIUM 9.3 8.3*   GFR: Estimated Creatinine Clearance: 38.5 mL/min (by C-G formula based on SCr of 0.93 mg/dL). Liver Function Tests: No results for input(s): AST, ALT,  ALKPHOS, BILITOT, PROT, ALBUMIN in the last 168 hours. No results for input(s): LIPASE, AMYLASE in the last 168 hours. No results for input(s): AMMONIA in the last 168 hours. Coagulation Profile: No results for input(s): INR, PROTIME in the last 168 hours. Cardiac Enzymes: Recent Labs  Lab 12/21/17 1407 12/22/17 0524  CKTOTAL 3,063* 819*   BNP (last 3 results) No results for input(s): PROBNP in the last 8760 hours. HbA1C: Recent Labs    12/23/17 1036  HGBA1C 6.0*   CBG: No results for input(s): GLUCAP in the last 168 hours. Lipid Profile: Recent Labs    12/23/17 1036  CHOL 142  HDL 43  LDLCALC 83  TRIG 81  CHOLHDL 3.3   Thyroid Function Tests: No results for input(s): TSH, T4TOTAL, FREET4, T3FREE, THYROIDAB in the last 72 hours. Anemia Panel: No results for input(s): VITAMINB12, FOLATE, FERRITIN, TIBC, IRON, RETICCTPCT in the last 72 hours. Sepsis Labs: No results for input(s): PROCALCITON, LATICACIDVEN in the last 168 hours.  Recent Results (from the past 240 hour(s))  Culture, Urine     Status: Abnormal   Collection Time: 12/22/17  9:58 AM  Result Value Ref Range Status   Specimen Description   Final    URINE, CLEAN CATCH Performed at [facility], [place] [address], [place], [place] [address]    Special Requests   Final    NONE Performed at [facility], [place] [address] [place], [place] [address]    Culture MULTIPLE SPECIES PRESENT, SUGGEST RECOLLECTION (A)  Final   Report Status 12/23/2017 FINAL  Final         Radiology Studies: Dg Thoracic Spine 2 View  Result Date: 12/21/2017 CLINICAL DATA:  Fall a few days ago with mid back pain. EXAM: THORACIC SPINE 2 VIEWS COMPARISON:  05/21/2016 FINDINGS: Mild curvature of the thoracic spine convex left unchanged. There are degenerative changes of the thoracic spine. Pedicles are intact. The lower thoracic spine is not well visualized on the lateral view due in part to the  curvature. No definite acute compression fracture. IMPRESSION: No acute findings. Curvature of the thoracic spine convex left unchanged with degenerative changes present. Electronically Signed   By: Marin Olp M.D.   On: 12/21/2017 15:26   Dg Lumbar Spine Complete  Result Date: 12/21/2017 CLINICAL DATA:  Fall over the past couple days with low back pain. EXAM: LUMBAR SPINE - COMPLETE 4+ VIEW COMPARISON:  05/21/2016 and CT 10/03/2012 FINDINGS: Moderate curvature of the lower thoracic/lumbar spine convex right unchanged. Moderate degenerative changes of the lumbar spine. Facet arthropathy. Disc disease at multiple levels of the lumbar spine and lower thoracic spine with exception of the L4-5 level. Mild stable L3 compression fracture. Mild depression of the superior endplate of L4 new since 2014 although likely chronic. Mild-to-moderate anterior wedging of L1 which appears to be progressed since 2014 and likely chronic. Slight anterior wedging of T11 and T12 with mild progression 2014 although likely chronic. IMPRESSION: No definite acute findings. Stable L3 compression fracture. Interval progression of mild compression deformities of T11, T12 and L1 since 2014 and likely chronic. Depression of the superior endplate of L4 likely  chronic. Moderate spondylosis of the lumbar spine to include facet arthropathy. Multilevel disc disease. Curvature convex right. Electronically Signed   By: Marin Olp M.D.   On: 12/21/2017 15:33   Ct Head Wo Contrast  Result Date: 12/22/2017 CLINICAL DATA:  Headache EXAM: CT HEAD WITHOUT CONTRAST TECHNIQUE: Contiguous axial images were obtained from the base of the skull through the vertex without intravenous contrast. COMPARISON:  None. FINDINGS: Brain: Image quality degraded by extensive motion. Multiple repeat images or performed. Hypodensity right parietal cortex, most likely due to acute infarct. Generalized atrophy with extensive chronic microvascular ischemic change  throughout the white matter. Negative for hemorrhage. Vascular: Negative for hyperdense vessel Skull: Negative Sinuses/Orbits: Paranasal sinuses clear.  Bilateral cataract surgery Other: None IMPRESSION: Hypodensity right parietal cortex most likely acute infarct. Mass lesion not excluded. MRI suggested. However, given the extensive motion on the current study, sedation will be required for the MRI Atrophy and extensive chronic microvascular ischemia in the white matter. Electronically Signed   By: Franchot Gallo M.D.   On: 12/22/2017 11:40   Mr Brain Wo Contrast  Result Date: 12/22/2017 CLINICAL DATA:  Recent fall. Found down this morning. Follow up abnormal head CT. History of breast cancer, hypertension. EXAM: MRI HEAD WITHOUT CONTRAST TECHNIQUE: Multiplanar, multiecho pulse sequences of the brain and surrounding structures were obtained without intravenous contrast. Axial T1 and coronal T2 sequences not obtained due to patient motion; patient removed head coil and could not continue examination. COMPARISON:  CT HEAD December 22, 2016 FINDINGS: INTRACRANIAL CONTENTS: Patchy reduced diffusion RIGHT parietal lobe, periventricular white matter and posterior insula with corresponding low ADC values. No susceptibility artifact to suggest hemorrhage. No midline shift, mass effect or definite masses though limited by motion. Confluent supratentorial and patchy pontine white matter FLAIR T2 hyperintensities. Prominent basal ganglia and thalami perivascular spaces in hazy T2 hyperintense signal so seen with chronic small vessel ischemic changes. No parenchymal brain volume loss for age. No hydrocephalus. No abnormal extra-axial fluid collections. VASCULAR: Major intracranial vascular flow voids present at skull base. Dolichoectatic intracranial vessels associated with chronic hypertension. SKULL AND UPPER CERVICAL SPINE: No abnormal sellar expansion. No suspicious calvarial bone marrow signal. Craniocervical junction  maintained. SINUSES/ORBITS: The mastoid air-cells and included paranasal sinuses are well-aerated.The included ocular globes and orbital contents are non-suspicious. Status post bilateral ocular lens implants. OTHER: None. IMPRESSION: 1. Moderately motion degraded truncated examination. 2. Moderate RIGHT MCA territory nonhemorrhagic infarct corresponding to CT abnormality. 3. Severe chronic small vessel ischemic changes. Electronically Signed   By: Elon Alas M.D.   On: 12/22/2017 14:04   Dg Knee Complete 4 Views Left  Result Date: 12/21/2017 CLINICAL DATA:  Fall over the past couple days with left knee pain. EXAM: LEFT KNEE - COMPLETE 4+ VIEW COMPARISON:  None. FINDINGS: Diffuse osteopenia. Mild osteoarthritic change. No acute fracture or dislocation. No significant joint effusion. IMPRESSION: No acute fracture. Electronically Signed   By: Marin Olp M.D.   On: 12/21/2017 15:40   Dg Knee Complete 4 Views Right  Result Date: 12/21/2017 CLINICAL DATA:  Fall over the past few days with right knee pain. EXAM: RIGHT KNEE - COMPLETE 4+ VIEW COMPARISON:  None. FINDINGS: Diffuse osteopenia. Total right knee arthroplasty intact and normally located. No acute fracture, dislocation or significant joint effusion. IMPRESSION: No acute findings. Electronically Signed   By: Marin Olp M.D.   On: 12/21/2017 15:41   Dg Hips Bilat W Or Wo Pelvis 3-4 Views  Result Date: 12/21/2017 CLINICAL DATA:  Fall over the past few days. EXAM: DG HIP (WITH OR WITHOUT PELVIS) 3-4V BILAT COMPARISON:  CT 10/03/2012 FINDINGS: Left hip arthroplasty intact with mild protrusio acetabuli. Mild diffuse osteopenia. Mild degenerative change of the right hip and moderate degenerate change of the spine. No acute fracture or dislocation. IMPRESSION: No acute fracture or dislocation. Mild degenerative change of the right hip. Left total of arthroplasty intact with mild protrusio acetabuli. Electronically Signed   By: Marin Olp M.D.    On: 12/21/2017 15:39        Scheduled Meds: . aspirin EC  81 mg Oral Daily  . clopidogrel  75 mg Oral Daily  . enoxaparin (LOVENOX) injection  40 mg Subcutaneous Q24H  . feeding supplement (ENSURE ENLIVE)  237 mL Oral BID BM  . levothyroxine  88 mcg Oral QAC breakfast   Continuous Infusions: . sodium chloride 125 mL/hr at 12/23/17 0453     LOS: 1 day    Time spent:25 mins. More than 50% of that time was spent in counseling and/or coordination of care.      Shelly Coss, MD [facility] Pager [phone]  If 7PM-7AM, please contact night-coverage [email or web address] Password [facility] 12/23/2017, 12:32 PM

## 2017-12-24 LAB — CK: Total CK: 123 U/L (ref 38–234)

## 2017-12-24 NOTE — Progress Notes (Signed)
Physical Therapy Treatment Patient Details Name: Tiffany [name] MRN: [id number] DOB: 12-06-1926 Today's Date: 12/24/2017    History of Present Illness 84 y.o. female s/p fall at independent living. MRI+ R MCA infarct PMH HTN, hypothyroidism    PT Comments    Patient seen for activity progression. Tolerated session very well. Was able to perform bed level tasks with less assist this session and tolerated initiation of ambulation with RW and increased assist. Continues to show noted weakness RLE with occasional buckling requiring increased assist. Current POC remains appropriate. Recommend SNF upon acute discharge.  Follow Up Recommendations  SNF;Supervision/Assistance - 24 hour     Equipment Recommendations  None recommended by PT    Recommendations for Other Services       Precautions / Restrictions Precautions Precautions: Fall Restrictions Weight Bearing Restrictions: No    Mobility  Bed Mobility Overal bed mobility: Needs Assistance Bed Mobility: Supine to Sit Rolling: Min assist Sidelying to sit: Min assist Supine to sit: Min assist     General bed mobility comments: Min assist to come to EOB with HOB elevated, increased time to perform  Transfers Overall transfer level: Needs assistance Equipment used: Rolling walker (2 wheeled) Transfers: Sit to/from Stand Sit to Stand: Min assist         General transfer comment: Vcs for hand placement, min assist for stability during power up to stand  Ambulation/Gait Ambulation/Gait assistance: Min assist Gait Distance (Feet): 30 Feet Assistive device: Rolling walker (2 wheeled) Gait Pattern/deviations: Antalgic;Trunk flexed;Drifts right/left;Narrow base of support;Step-to pattern     General Gait Details: patient with altered gait, noted RLE flexion with ocassional buckling requiring increased moderate assist x2. Otherwise min assist for safety.    Stairs             Wheelchair Mobility    Modified  Rankin (Stroke Patients Only) Modified Rankin (Stroke Patients Only) Pre-Morbid Rankin Score: No significant disability Modified Rankin: Moderately severe disability     Balance Overall balance assessment: Needs assistance   Sitting balance-Leahy Scale: Good Sitting balance - Comments: tolerate EOB without difficulty   Standing balance support: Bilateral upper extremity supported;During functional activity Standing balance-Leahy Scale: Poor Standing balance comment: reliance on RW for upright support                            Cognition Arousal/Alertness: Awake/alert Behavior During Therapy: WFL for tasks assessed/performed Overall Cognitive Status: No family/caregiver present to determine baseline cognitive functioning Area of Impairment: Memory;Following commands;Safety/judgement;Awareness                     Memory: Decreased short-term memory Following Commands: Follows one step commands inconsistently Safety/Judgement: Decreased awareness of deficits;Decreased awareness of safety Awareness: Intellectual          Exercises      General Comments        Pertinent Vitals/Pain Pain Assessment: Faces Pain Score: 4  Faces Pain Scale: Hurts even more Pain Location: back Pain Descriptors / Indicators: Aching Pain Intervention(s): Monitored during session    Home Living                      Prior Function            PT Goals (current goals can now be found in the care plan section) Acute Rehab PT Goals Patient Stated Goal: none stated PT Goal Formulation: With patient Time For Goal Achievement: 01/05/18 Potential  to Achieve Goals: Fair Progress towards PT goals: Progressing toward goals    Frequency    Min 3X/week      PT Plan Current plan remains appropriate    Co-evaluation              AM-PAC PT "6 Clicks" Daily Activity  Outcome Measure  Difficulty turning over in bed (including adjusting bedclothes, sheets  and blankets)?: Unable Difficulty moving from lying on back to sitting on the side of the bed? : Unable Difficulty sitting down on and standing up from a chair with arms (e.g., wheelchair, bedside commode, etc,.)?: Unable Help needed moving to and from a bed to chair (including a wheelchair)?: A Little Help needed walking in hospital room?: A Little Help needed climbing 3-5 steps with a railing? : A Lot 6 Click Score: 11    End of Session Equipment Utilized During Treatment: Gait belt Activity Tolerance: Patient limited by fatigue Patient left: in bed;with call bell/phone within reach;with bed alarm set Nurse Communication: Mobility status PT Visit Diagnosis: Other abnormalities of gait and mobility (R26.89);History of falling (Z91.81);Other symptoms and signs involving the nervous system (R29.898)     Time: 8768-1157 PT Time Calculation (min) (ACUTE ONLY): 18 min  Charges:  $Gait Training: 8-22 mins                     Alben Deeds, PT DPT  Board Certified Neurologic Specialist [place] 12/24/2017, 2:01 PM

## 2017-12-24 NOTE — Evaluation (Signed)
Speech Language Pathology Evaluation Patient Details Name: Tiffany [name] MRN: [id number] DOB: [birth year] Today's Date: 12/24/2017 Time: 8101-7510 SLP Time Calculation (min) (ACUTE ONLY): 46 min  Problem List:  Patient Active Problem List   Diagnosis Date Noted  . Cerebral embolism with cerebral infarction 12/23/2017  . Pressure injury of skin 12/22/2017  . Fall 12/21/2017  . Generalized weakness 12/21/2017  . Rhabdomyolysis 12/21/2017  . CKD (chronic kidney disease) stage 3, GFR 30-59 ml/min (HCC) 12/21/2017  . Hypothyroidism 12/21/2017  . Palpitations 12/17/2012  . Internal bleeding hemorrhoids 10/26/2012  . Rectal bleeding 10/04/2012  . HTN (hypertension) 10/04/2012   Past Medical History:  Past Medical History:  Diagnosis Date  . Arthritis   . Breast cancer ([place])   . Colon polyps   . Hypertension   . Hypothyroidism   . Osteoporosis   . Rectal bleeding   . Skin cancer    Past Surgical History:  Past Surgical History:  Procedure Laterality Date  . ABDOMINAL HYSTERECTOMY    . BILATERAL TOTAL MASTECTOMY WITH AXILLARY LYMPH NODE DISSECTION    . CATARACT EXTRACTION    . CHOLECYSTECTOMY    . COLONOSCOPY N/A 10/07/2012   Procedure: COLONOSCOPY;  Surgeon: Arta Silence, MD;  Location: [facility] ENDOSCOPY;  Service: Endoscopy;  Laterality: N/A;  . hip replacment    . HIP SURGERY    . JOINT REPLACEMENT    . MASTECTOMY    . REPLACEMENT TOTAL KNEE    . shin repair     HPI:  Tiffany [name] is a 84 y.o. female with medical history significant of HTN, hypothyroidism, breast cancer who presents after fall at her independent living facility. MRI Moderate RIGHT MCA territory nonhemorrhagic infarct corresponding to CT abnormality.   Assessment / Plan / Recommendation Clinical Impression  Patient presents with cognitive linguistic deficits impacting basic problem solving, clock drawing impairment *did not fill out any numbers on left side - left visual field cut, memory deficits.      Pt will benefit from follow up SLP to maximize cognitive linguistic rehab as pt reports she managed her own bills/medicatons at home prior to admit.   Pt agreeable to plan.     SLP Assessment  SLP Recommendation/Assessment: Patient needs continued Speech Lanaguage Pathology Services SLP Visit Diagnosis: Cognitive communication deficit (R41.841)    Follow Up Recommendations  None    Frequency and Duration min 1 x/week  1 week      SLP Evaluation Cognition  Overall Cognitive Status: Within Functional Limits for tasks assessed Arousal/Alertness: Awake/alert Memory: Impaired Memory Impairment: Decreased recall of new information(didn't recall she had a stroke) Awareness: Impaired Problem Solving: Impaired Problem Solving Impairment: Functional basic(for calling nurse to find out if took pain medication) Behaviors: Other (comment)(pt is verbose, suspect compensating/distracting from cognitive deficit, advised she wants to see [name] her son)       Comprehension  Auditory Comprehension Commands: Impaired One Step Basic Commands: 75-100% accurate Two Step Basic Commands: 50-74% accurate Other Conversation Comments: pt required multiple repetitions of directions Interfering Components: Attention;Working memory;Processing speed EffectiveTechniques: Audiological scientist Discrimination: Not tested Reading Comprehension Reading Status: Impaired(due to left visual field cut) Effective Techniques: Other (comment);Visual cueing;Tactile cueing(to examine left side of paper)    Expression Expression Primary Mode of Expression: Verbal Verbal Expression Overall Verbal Expression: Appears within functional limits for tasks assessed Initiation: No impairment Level of Generative/Spontaneous Verbalization: Conversation Repetition: No impairment Naming: Impairment(2/3 items, stated rhino rather than hippo) Convergent: Not  tested Divergent: Not  tested Pragmatics: No impairment Non-Verbal Means of Communication: Not applicable Written Expression Dominant Hand: Right Written Expression: (difficulty drawing clock)   Oral / Motor      GO                    [name] 12/24/2017, 10:17 AM  Luanna Salk, [facility] SLP [phone]

## 2017-12-24 NOTE — Progress Notes (Signed)
PROGRESS NOTE    Tiffany [name]  YBO:[id number] DOB: August 31, 1926 DOA: 12/21/2017 PCP: Donald Prose, MD   Brief Narrative: Patient is 84 year old female with past medical history of hypertension, hypothyroidism who presented with fall and weakness from independent living facility.  She was found to have elevated CK.  X-rays were unremarkable for the fractures.  MRI of the brain showed acute ischemic stroke.  Neurology was following.  PT/OT evaluation done and recommended skilled nursing facility on discharge.  Patient is medically stable for discharge to skilled nursing facility as soon as possible.  Assessment & Plan:   Principal Problem:   Fall Active Problems:   HTN (hypertension)   Generalized weakness   Rhabdomyolysis   CKD (chronic kidney disease) stage 3, GFR 30-59 ml/min (HCC)   Hypothyroidism   Pressure injury of skin   Cerebral embolism with cerebral infarction   Acute right-sided ischemic infarction: MRI of the brain showed moderate right MCA territory infarct.  Patient presented with bilateral lower extremity weakness more on the left.  Neurology was following.  Continue aspirin and Plavix for 3 weeks and then continue aspirin only.  Echocardiograms did not show any wall motion abnormalities, ejection fraction of 60 to 65%. Carotid Dopplers did not show any significant carotid artery stenosis.  Hemoglobin A1c is 6.0. LDL of 83 .  Speech therapy evaluated  the patient.  PT/OT evaluation done.  Falls/generalized weakness/deconditioning: No acute fractures or injuries on the x-rays.  PT/OT were  following.  Rhabdomyolysis:Due to fall.  CK level back to baseline  Leukocytosis: Most likely reactive.  Resolved  Thrombocytopenia: Likely chronic.  No signs of bleeding.  CKD stage III: Creatinine on baseline.  Hypertension: Restarted home medications.  Hypothyroidism: Continue Synthyroid.   DVT prophylaxis: Lovenox Code Status: DNR Family Communication: None present at the  bedside Disposition Plan: Skilled nursing facility as soon as possible  Consultants: neurology  Procedures:MRI  Antimicrobials: None  Subjective: Patient seen and examined bedside this morning.  Remains comfortable.  Alert and oriented.  No active issues.  Objective: Vitals:   12/23/17 1638 12/23/17 1948 12/23/17 2346 12/24/17 0334  BP: 134/63 (!) 153/53 (!) 143/73 (!) 120/103  Pulse: 61 (!) 55 63 (!) 59  Resp: 16 18 18 18   Temp: 98.3 F (36.8 C) 98.2 F (36.8 C) 98.1 F (36.7 C) 98 F (36.7 C)  TempSrc: Oral Oral Oral Oral  SpO2: 100% 97% 96% 96%  Weight:      Height:        Intake/Output Summary (Last 24 hours) at 12/24/2017 1229 Last data filed at 12/23/2017 2778 Gross per 24 hour  Intake 360 ml  Output -  Net 360 ml   Filed Weights   12/22/17 1453 12/23/17 0057  Weight: 71.8 kg 70.9 kg    Examination:  General exam: Appears calm and comfortable ,Not in distress,average built HEENT:PERRL,Oral mucosa moist, Ear/Nose normal on gross exam Respiratory system: Bilateral equal air entry, normal vesicular breath sounds, no wheezes or crackles  Cardiovascular system: S1 & S2 heard, RRR. No JVD, murmurs, rubs, gallops or clicks. No pedal edema. Gastrointestinal system: Abdomen is nondistended, soft and nontender. No organomegaly or masses felt. Normal bowel sounds heard. Central nervous system: Alert and oriented. Extremities: No edema, no clubbing ,no cyanosis, distal peripheral pulses palpable. Skin: No rashes, lesions or ulcers,no icterus ,no pallor    Data Reviewed: I have personally reviewed following labs and imaging studies  CBC: Recent Labs  Lab 12/21/17 1407 12/22/17 0524  WBC 12.4*  9.0  HGB 14.6 11.3*  HCT 43.8 35.3*  MCV 91.1 93.1  PLT 171 469*   Basic Metabolic Panel: Recent Labs  Lab 12/21/17 1407 12/22/17 0524  NA 145 143  K 3.9 3.6  CL 111 116*  CO2 27 24  GLUCOSE 156* 104*  BUN 31* 30*  CREATININE 1.04* 0.93  CALCIUM 9.3 8.3*    GFR: Estimated Creatinine Clearance: 38.5 mL/min (by C-G formula based on SCr of 0.93 mg/dL). Liver Function Tests: No results for input(s): AST, ALT, ALKPHOS, BILITOT, PROT, ALBUMIN in the last 168 hours. No results for input(s): LIPASE, AMYLASE in the last 168 hours. No results for input(s): AMMONIA in the last 168 hours. Coagulation Profile: No results for input(s): INR, PROTIME in the last 168 hours. Cardiac Enzymes: Recent Labs  Lab 12/21/17 1407 12/22/17 0524 12/24/17 0441  CKTOTAL 3,063* 819* 123   BNP (last 3 results) No results for input(s): PROBNP in the last 8760 hours. HbA1C: Recent Labs    12/23/17 1036  HGBA1C 6.0*   CBG: No results for input(s): GLUCAP in the last 168 hours. Lipid Profile: Recent Labs    12/23/17 1036  CHOL 142  HDL 43  LDLCALC 83  TRIG 81  CHOLHDL 3.3   Thyroid Function Tests: No results for input(s): TSH, T4TOTAL, FREET4, T3FREE, THYROIDAB in the last 72 hours. Anemia Panel: No results for input(s): VITAMINB12, FOLATE, FERRITIN, TIBC, IRON, RETICCTPCT in the last 72 hours. Sepsis Labs: No results for input(s): PROCALCITON, LATICACIDVEN in the last 168 hours.  Recent Results (from the past 240 hour(s))  Culture, Urine     Status: Abnormal   Collection Time: 12/22/17  9:58 AM  Result Value Ref Range Status   Specimen Description   Final    URINE, CLEAN CATCH Performed at [facility], [place] [address] [place], [place] [address]    Special Requests   Final    NONE Performed at [facility], [place] [address], [place], [place] [address]    Culture MULTIPLE SPECIES PRESENT, SUGGEST RECOLLECTION (A)  Final   Report Status 12/23/2017 FINAL  Final         Radiology Studies: Mr Brain Wo Contrast  Result Date: 12/22/2017 CLINICAL DATA:  Recent fall. Found down this morning. Follow up abnormal head CT. History of breast cancer, hypertension. EXAM: MRI HEAD WITHOUT CONTRAST TECHNIQUE:  Multiplanar, multiecho pulse sequences of the brain and surrounding structures were obtained without intravenous contrast. Axial T1 and coronal T2 sequences not obtained due to patient motion; patient removed head coil and could not continue examination. COMPARISON:  CT HEAD December 22, 2016 FINDINGS: INTRACRANIAL CONTENTS: Patchy reduced diffusion RIGHT parietal lobe, periventricular white matter and posterior insula with corresponding low ADC values. No susceptibility artifact to suggest hemorrhage. No midline shift, mass effect or definite masses though limited by motion. Confluent supratentorial and patchy pontine white matter FLAIR T2 hyperintensities. Prominent basal ganglia and thalami perivascular spaces in hazy T2 hyperintense signal so seen with chronic small vessel ischemic changes. No parenchymal brain volume loss for age. No hydrocephalus. No abnormal extra-axial fluid collections. VASCULAR: Major intracranial vascular flow voids present at skull base. Dolichoectatic intracranial vessels associated with chronic hypertension. SKULL AND UPPER CERVICAL SPINE: No abnormal sellar expansion. No suspicious calvarial bone marrow signal. Craniocervical junction maintained. SINUSES/ORBITS: The mastoid air-cells and included paranasal sinuses are well-aerated.The included ocular globes and orbital contents are non-suspicious. Status post bilateral ocular lens implants. OTHER: None. IMPRESSION: 1. Moderately motion degraded truncated  examination. 2. Moderate RIGHT MCA territory nonhemorrhagic infarct corresponding to CT abnormality. 3. Severe chronic small vessel ischemic changes. Electronically Signed   By: Elon Alas M.D.   On: 12/22/2017 14:04        Scheduled Meds: . aspirin EC  81 mg Oral Daily  . clopidogrel  75 mg Oral Daily  . enoxaparin (LOVENOX) injection  40 mg Subcutaneous Q24H  . feeding supplement (ENSURE ENLIVE)  237 mL Oral BID BM  . irbesartan  300 mg Oral q morning - 10a  .  levothyroxine  88 mcg Oral QAC breakfast  . metoprolol tartrate  50 mg Oral BID   Continuous Infusions: . sodium chloride 75 mL/hr at 12/23/17 1245     LOS: 2 days    Time spent:25 mins. More than 50% of that time was spent in counseling and/or coordination of care.      Shelly Coss, MD [facility] Pager [phone]  If 7PM-7AM, please contact night-coverage [email or web address] Password [facility] 12/24/2017, 12:29 PM

## 2017-12-24 NOTE — Progress Notes (Signed)
CSW following for discharge plan. CSW spoke with patient's son over the phone and then in person at bedside to discuss discharge plan. Patient's son has chosen [facility]; they are initiating authorization through [employer].   Patient's son requested update from neurologist. CSW alerted MD.  Patient's son requested a minute to talk about how overwhelmed he is with everything going on with his mom. CSW provided emotional support.  CSW to follow.  Laveda Abbe, [place] Clinical Social Worker [phone]

## 2017-12-24 NOTE — Progress Notes (Signed)
STROKE TEAM PROGRESS NOTE    INTERVAL HISTORY No family is at the bedside.  She remains stable. Awaiting SNF bed. Echo was normal.LDL cholesterol was 83 mg percent and hemoglobin A1c was 6.0.  Vitals:   12/23/17 2346 12/24/17 0334 12/24/17 0830 12/24/17 1236  BP: (!) 143/73 (!) 120/103 132/85 (!) 155/68  Pulse: 63 (!) 59 63 66  Resp: 18 18 17 16   Temp: 98.1 F (36.7 C) 98 F (36.7 C) 98.2 F (36.8 C) 98.2 F (36.8 C)  TempSrc: Oral Oral Oral Oral  SpO2: 96% 96% 97% 97%  Weight:      Height:        CBC:  Recent Labs  Lab 12/21/17 1407 12/22/17 0524  WBC 12.4* 9.0  HGB 14.6 11.3*  HCT 43.8 35.3*  MCV 91.1 93.1  PLT 171 134*    Basic Metabolic Panel:  Recent Labs  Lab 12/21/17 1407 12/22/17 0524  NA 145 143  K 3.9 3.6  CL 111 116*  CO2 27 24  GLUCOSE 156* 104*  BUN 31* 30*  CREATININE 1.04* 0.93  CALCIUM 9.3 8.3*   Lipid Panel:     Component Value Date/Time   CHOL 142 12/23/2017 1036   TRIG 81 12/23/2017 1036   HDL 43 12/23/2017 1036   CHOLHDL 3.3 12/23/2017 1036   VLDL 16 12/23/2017 1036   LDLCALC 83 12/23/2017 1036   HgbA1c:  Lab Results  Component Value Date   HGBA1C 6.0 (H) 12/23/2017   Urine Drug Screen: No results found for: LABOPIA, COCAINSCRNUR, LABBENZ, AMPHETMU, THCU, LABBARB  Alcohol Level No results found for: [facility]  IMAGING Mr Brain Wo Contrast  Result Date: 12/22/2017 CLINICAL DATA:  Recent fall. Found down this morning. Follow up abnormal head CT. History of breast cancer, hypertension. EXAM: MRI HEAD WITHOUT CONTRAST TECHNIQUE: Multiplanar, multiecho pulse sequences of the brain and surrounding structures were obtained without intravenous contrast. Axial T1 and coronal T2 sequences not obtained due to patient motion; patient removed head coil and could not continue examination. COMPARISON:  CT HEAD December 22, 2016 FINDINGS: INTRACRANIAL CONTENTS: Patchy reduced diffusion RIGHT parietal lobe, periventricular white matter and posterior  insula with corresponding low ADC values. No susceptibility artifact to suggest hemorrhage. No midline shift, mass effect or definite masses though limited by motion. Confluent supratentorial and patchy pontine white matter FLAIR T2 hyperintensities. Prominent basal ganglia and thalami perivascular spaces in hazy T2 hyperintense signal so seen with chronic small vessel ischemic changes. No parenchymal brain volume loss for age. No hydrocephalus. No abnormal extra-axial fluid collections. VASCULAR: Major intracranial vascular flow voids present at skull base. Dolichoectatic intracranial vessels associated with chronic hypertension. SKULL AND UPPER CERVICAL SPINE: No abnormal sellar expansion. No suspicious calvarial bone marrow signal. Craniocervical junction maintained. SINUSES/ORBITS: The mastoid air-cells and included paranasal sinuses are well-aerated.The included ocular globes and orbital contents are non-suspicious. Status post bilateral ocular lens implants. OTHER: None. IMPRESSION: 1. Moderately motion degraded truncated examination. 2. Moderate RIGHT MCA territory nonhemorrhagic infarct corresponding to CT abnormality. 3. Severe chronic small vessel ischemic changes. Electronically Signed   By: Elon Alas M.D.   On: 12/22/2017 14:04   Carotid Doppler   There is 1-39% bilateral ICA stenosis. Vertebral artery flow is antegrade.   2D Echocardiogram  pending    PHYSICAL EXAM Pleasant elderly Caucasian lady not in distress. . Afebrile. Head is nontraumatic. Neck is supple without bruit.    Cardiac exam no murmur or gallop. Lungs are clear to auscultation. Distal pulses are  well felt. Neurological Exam ;  Awake  Alert oriented x 3. Normal speech and language.eye movements full without nystagmus.fundi were not visualized. Vision acuity and fields appear normal. Hearing is normal. Palatal movements are normal. Face symmetric. Tongue midline. Normal strength, tone, reflexes and coordination.  Normal sensation. Gait deferred.  ASSESSMENT/PLAN Ms. Tiffany [name] is a 84 y.o. female with history of HTN and recent fall w/ hx frequent falls d/t hip replacements presenting with poor balance. MRI shows a R parietal infarct.   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 Small vessel disease. Atrophy.   MRI  R MCA INFARCT. Small vessel disease.   MRA  pending   Carotid Doppler  B ICA 1-39% stenosis, VAs antegrade   2D Echo  Left ventricle: The cavity size was normal. There was mild focal   basal hypertrophy of the septum. Systolic function was normal.    The estimated ejection fraction was in the range of 60% to 65  Not an anticoagulation candidate d/t hx frequent falls, so will not pursue TEE and loop  LDL 83 mg percent  HgbA1c 6.0  Lovenox 40 mg sq daily for VTE prophylaxis  No antithrombotic prior to admission, now on aspirin 325 mg daily. Given mild stroke, will place on aspirin 81 mg and plavix 75 mg daily x 3 weeks, then aspirin alone. Orders adjusted.   Therapy recommendations:  SNF  Disposition:  pending  (lives at [facility])  Hypertension  Stable . Permissive hypertension (OK if < 220/120) but gradually normalize in 5-7 days . Long-term BP goal normotensive  Other Stroke Risk Factors  Advanced age  Other Active Problems  Fall, mild rhoabdomylosis  CKD stage 3  Hypothyroidism   Hospital day # 2    I have personally examined this patient, reviewed notes, independently viewed imaging studies, participated in medical decision making and plan of care.ROS completed by me personally and pertinent positives fully documented  I have made any additions or clarifications directly to the above note. Agree with note above. She presented with transient left hand weakness secondary to embolic right MCA branch infarct. Patient appears to be a fall risk and is  elderly and may not be the best candidate for long-term anticoagulation.  Recommend dual antiplatelet therapy for 3 weeks followed by single agent. Await transfer to skilled nursing facility when bed available. Stroke team will sign off. Kindly call for questions.discussed with Dr.Adhikari Antony Contras, MD Medical Director [place] Pager: [phone] 12/24/2017 12:42 PM   To contact Stroke Continuity provider, please refer to [email or web address] After hours, contact General Neurology

## 2017-12-24 NOTE — Progress Notes (Signed)
  Speech Language Pathology Treatment: Dysphagia  Patient Details Name: Tiffany [name] MRN: [id number] DOB: 1927/04/24 Today's Date: 12/24/2017 Time: 4707-6151 SLP Time Calculation (min) (ACUTE ONLY): 13 min  Assessment / Plan / Recommendation Clinical Impression  Pt seen for follow up re: swallowing ability.  Pt admits to cough on occasion with breakfast= on orange juice and grits.  No indication of airway compromise with po observed *graham crackers, thin liquids.    Pt did not pass 3 ounce water test - as she had a subtle throat clear/cough after swallow.  Pt denies having required heimlich maneuver nor significant weight loss.  Recommend continue diet as tolerated.  Will follow up x1 for education with son/pt.    HPI HPI: Tiffany [name] is a 84 y.o. female with medical history significant of HTN, hypothyroidism, breast cancer who presents after fall at her independent living facility. MRI Moderate RIGHT MCA territory nonhemorrhagic infarct corresponding to CT abnormality.      SLP Plan  Continue with current plan of care  Patient needs continued Speech Lanaguage Pathology Services    Recommendations  Diet recommendations: Regular;Thin liquid Medication Administration: Whole meds with liquid Compensations: Slow rate;Small sips/bites;Minimize environmental distractions Postural Changes and/or Swallow Maneuvers: Seated upright 90 degrees;Out of bed for meals;Upright 30-60 min after meal                Oral Care Recommendations: Oral care BID Follow up Recommendations: 24 hour supervision/assistance SLP Visit Diagnosis: Dysphagia, unspecified (R13.10) Plan: Continue with current plan of care       GO                [name] 12/24/2017, 10:19 AM   Luanna Salk, [facility] SLP [phone]

## 2017-12-25 NOTE — Progress Notes (Signed)
PROGRESS NOTE    Tiffany [name]  TJQ:[id number] DOB: May 15, 1926 DOA: 12/21/2017 PCP: Donald Prose, MD   Brief Narrative: Patient is 84 year old female with past medical history of hypertension, hypothyroidism who presented with fall and weakness from independent living facility.  She was found to have elevated CK.  X-rays were unremarkable for the fractures.  MRI of the brain showed acute ischemic stroke.  Neurology was following.  PT/OT evaluation done and recommended skilled nursing facility on discharge.  Patient is medically stable for discharge to skilled nursing facility as soon as possible.  Assessment & Plan:   Principal Problem:   Fall Active Problems:   HTN (hypertension)   Generalized weakness   Rhabdomyolysis   CKD (chronic kidney disease) stage 3, GFR 30-59 ml/min (HCC)   Hypothyroidism   Pressure injury of skin   Cerebral embolism with cerebral infarction   Acute right-sided ischemic infarction: MRI of the brain showed moderate right MCA territory infarct.  Patient presented with bilateral lower extremity weakness more on the left.  Neurology was following.  Continue aspirin and Plavix for 3 weeks and then continue aspirin only.  Echocardiograms did not show any wall motion abnormalities, ejection fraction of 60 to 65%. Carotid Dopplers did not show any significant carotid artery stenosis.  Hemoglobin A1c is 6.0. LDL of 83 .  Speech therapy evaluated  the patient.  PT/OT evaluation done.  Falls/generalized weakness/deconditioning: No acute fractures or injuries on the x-rays.  PT/OT were  following.  Rhabdomyolysis:Due to fall.  CK level back to baseline  Leukocytosis: Most likely reactive.  Resolved  Thrombocytopenia: Likely chronic.  No signs of bleeding.  CKD stage III: Creatinine on baseline.  Hypertension: Restarted home medications.  Hypothyroidism: Continue Synthyroid.   DVT prophylaxis: Lovenox Code Status: DNR Family Communication: None present at the  bedside Disposition Plan: Skilled nursing facility as soon as possible  Consultants: neurology  Procedures:MRI  Antimicrobials: None  Subjective: Patient seen and examined at bedside this morning.  Remains comfortable.  No new issues.  Stable for discharge.  Objective: Vitals:   12/24/17 1945 12/24/17 2351 12/25/17 0335 12/25/17 0912  BP: (!) 156/82 118/67 126/80 (!) 160/72  Pulse: 69 63 (!) 58 64  Resp: 18 18 18 16   Temp: 98 F (36.7 C) 98 F (36.7 C) 97.8 F (36.6 C) 97.8 F (36.6 C)  TempSrc: Oral Oral Oral Oral  SpO2: 94% 95% 98% 98%  Weight:      Height:        Intake/Output Summary (Last 24 hours) at 12/25/2017 1011 Last data filed at 12/25/2017 0000 Gross per 24 hour  Intake 708.62 ml  Output -  Net 708.62 ml   Filed Weights   12/22/17 1453 12/23/17 0057  Weight: 71.8 kg 70.9 kg    Examination:  General exam: Appears calm and comfortable ,Not in distress,average built HEENT:PERRL,Oral mucosa moist, Ear/Nose normal on gross exam Respiratory system: Bilateral equal air entry, normal vesicular breath sounds, no wheezes or crackles  Cardiovascular system: S1 & S2 heard, RRR. No JVD, murmurs, rubs, gallops or clicks. No pedal edema. Gastrointestinal system: Abdomen is nondistended, soft and nontender. No organomegaly or masses felt. Normal bowel sounds heard. Central nervous system: Alert and oriented. Extremities: No edema, no clubbing ,no cyanosis, distal peripheral pulses palpable. Skin: No rashes, lesions or ulcers,no icterus ,no pallor    Data Reviewed: I have personally reviewed following labs and imaging studies  CBC: Recent Labs  Lab 12/21/17 1407 12/22/17 0524  WBC 12.4* 9.0  HGB 14.6 11.3*  HCT 43.8 35.3*  MCV 91.1 93.1  PLT 171 989*   Basic Metabolic Panel: Recent Labs  Lab 12/21/17 1407 12/22/17 0524  NA 145 143  K 3.9 3.6  CL 111 116*  CO2 27 24  GLUCOSE 156* 104*  BUN 31* 30*  CREATININE 1.04* 0.93  CALCIUM 9.3 8.3*    GFR: Estimated Creatinine Clearance: 38.5 mL/min (by C-G formula based on SCr of 0.93 mg/dL). Liver Function Tests: No results for input(s): AST, ALT, ALKPHOS, BILITOT, PROT, ALBUMIN in the last 168 hours. No results for input(s): LIPASE, AMYLASE in the last 168 hours. No results for input(s): AMMONIA in the last 168 hours. Coagulation Profile: No results for input(s): INR, PROTIME in the last 168 hours. Cardiac Enzymes: Recent Labs  Lab 12/21/17 1407 12/22/17 0524 12/24/17 0441  CKTOTAL 3,063* 819* 123   BNP (last 3 results) No results for input(s): PROBNP in the last 8760 hours. HbA1C: Recent Labs    12/23/17 1036  HGBA1C 6.0*   CBG: No results for input(s): GLUCAP in the last 168 hours. Lipid Profile: Recent Labs    12/23/17 1036  CHOL 142  HDL 43  LDLCALC 83  TRIG 81  CHOLHDL 3.3   Thyroid Function Tests: No results for input(s): TSH, T4TOTAL, FREET4, T3FREE, THYROIDAB in the last 72 hours. Anemia Panel: No results for input(s): VITAMINB12, FOLATE, FERRITIN, TIBC, IRON, RETICCTPCT in the last 72 hours. Sepsis Labs: No results for input(s): PROCALCITON, LATICACIDVEN in the last 168 hours.  Recent Results (from the past 240 hour(s))  Culture, Urine     Status: Abnormal   Collection Time: 12/22/17  9:58 AM  Result Value Ref Range Status   Specimen Description   Final    URINE, CLEAN CATCH Performed at [facility], [place] [address], [place], [place] [address]    Special Requests   Final    NONE Performed at [facility], [place] [address] [place], [place] [address]    Culture MULTIPLE SPECIES PRESENT, SUGGEST RECOLLECTION (A)  Final   Report Status 12/23/2017 FINAL  Final         Radiology Studies: No results found.      Scheduled Meds: . aspirin EC  81 mg Oral Daily  . clopidogrel  75 mg Oral Daily  . enoxaparin (LOVENOX) injection  40 mg Subcutaneous Q24H  . feeding supplement (ENSURE ENLIVE)  237 mL  Oral BID BM  . irbesartan  300 mg Oral q morning - 10a  . levothyroxine  88 mcg Oral QAC breakfast  . metoprolol tartrate  50 mg Oral BID   Continuous Infusions: . sodium chloride Stopped (12/24/17 1900)     LOS: 3 days    Time spent:25 mins. More than 50% of that time was spent in counseling and/or coordination of care.      Shelly Coss, MD [facility] Pager [phone]  If 7PM-7AM, please contact night-coverage [email or web address] Password [email or web address] 12/25/2017, 10:11 AM

## 2017-12-25 NOTE — Progress Notes (Signed)
Occupational Therapy Treatment Patient Details Name: Tiffany [name] MRN: [id number] DOB: 05-17-1926 Today's Date: 12/25/2017    History of present illness 84 y.o. female s/p fall at independent living. MRI+ R MCA infarct PMH HTN, hypothyroidism   OT comments  Pt progressing towards acute OT goals. Pt completed grooming tasks sitting at sink, toilet transfer, and pericare. A few seated rest breaks incorporated especially during mobility. Pt reporting fatigue. Of note, mostly cheerful demeanor throughout session but noted to become tearful towards end of session, "Why am I like this now? I'm not usually tearful like this. I was this way with my son last night too." D/c plan remains appropriate.     Follow Up Recommendations  SNF    Equipment Recommendations  3 in 1 bedside commode;Wheelchair (measurements OT);Wheelchair cushion (measurements OT);Hospital bed    Recommendations for Other Services      Precautions / Restrictions Precautions Precautions: Fall Restrictions Weight Bearing Restrictions: No       Mobility Bed Mobility Overal bed mobility: Needs Assistance Bed Mobility: Supine to Sit;Sit to Supine     Supine to sit: Min assist Sit to supine: Min assist      Transfers Overall transfer level: Needs assistance Equipment used: Rolling walker (2 wheeled) Transfers: Sit to/from Stand Sit to Stand: Min assist         General transfer comment: min A to steady, from EOB, visitor's chair, and BSC over toilet. Cues for seqencing movements with rw.     Balance Overall balance assessment: Needs assistance Sitting-balance support: Bilateral upper extremity supported;Feet supported Sitting balance-Leahy Scale: Good Sitting balance - Comments: tolerate EOB without difficulty   Standing balance support: Bilateral upper extremity supported;During functional activity Standing balance-Leahy Scale: Poor Standing balance comment: reliance on RW for upright support;  fatigues quickly                           ADL either performed or assessed with clinical judgement   ADL Overall ADL's : Needs assistance/impaired     Grooming: Brushing hair;Sitting                   Toilet Transfer: Minimal assistance;RW   Toileting- Clothing Manipulation and Hygiene: Min guard;Sit to/from stand       Functional mobility during ADLs: Moderate assistance;Rolling walker General ADL Comments: Pt csat EOB several minutes then walked to sink, sat and combed hair. Pt then completed toilet transfer and pericare and walked back to bed with a seated rest break incorporated. "I'm just so tired." Pt verbalizing fear of falling. Discussed benefit of continuing to walk with assistance to prevent deconditioning and to reduce overall likelihood of another fall.      Vision       Perception     Praxis      Cognition Arousal/Alertness: Awake/alert Behavior During Therapy: WFL for tasks assessed/performed(tearful towards end of session) Overall Cognitive Status: No family/caregiver present to determine baseline cognitive functioning Area of Impairment: Memory;Following commands;Safety/judgement;Awareness                     Memory: Decreased short-term memory Following Commands: Follows one step commands inconsistently Safety/Judgement: Decreased awareness of deficits;Decreased awareness of safety Awareness: Intellectual   General Comments: verbal perseveration, easily distracted. Mostly cheerful demeanor but noted to become tearful towards end of session, "Im not usually teraful like this. I was this way with my son last night too."  Exercises     Shoulder Instructions       General Comments      Pertinent Vitals/ Pain       Pain Assessment: Faces Faces Pain Scale: Hurts little more Pain Location: back Pain Descriptors / Indicators: Aching Pain Intervention(s): Monitored during session;Repositioned  Home Living                                           Prior Functioning/Environment              Frequency  Min 2X/week        Progress Toward Goals  OT Goals(current goals can now be found in the care plan section)  Progress towards OT goals: Progressing toward goals  Acute Rehab OT Goals Patient Stated Goal: none stated OT Goal Formulation: Patient unable to participate in goal setting Time For Goal Achievement: 01/06/18 Potential to Achieve Goals: Good ADL Goals Pt Will Perform Grooming: with supervision;sitting Pt Will Perform Upper Body Bathing: with supervision;sitting Pt Will Transfer to Toilet: with min assist;bedside commode;ambulating Additional ADL Goal #1: pt will complete bed mobility supervision level as precursor to adls  Plan Discharge plan remains appropriate    Co-evaluation                 AM-PAC PT "6 Clicks" Daily Activity     Outcome Measure   Help from another person eating meals?: A Little Help from another person taking care of personal grooming?: A Little Help from another person toileting, which includes using toliet, bedpan, or urinal?: A Lot Help from another person bathing (including washing, rinsing, drying)?: A Lot Help from another person to put on and taking off regular upper body clothing?: A Little Help from another person to put on and taking off regular lower body clothing?: A Lot 6 Click Score: 15    End of Session Equipment Utilized During Treatment: Gait belt;Rolling walker  OT Visit Diagnosis: Unsteadiness on feet (R26.81);Muscle weakness (generalized) (M62.81);Repeated falls (R29.6)   Activity Tolerance Patient limited by fatigue;Patient tolerated treatment well   Patient Left in bed;with call bell/phone within reach;with bed alarm set   Nurse Communication          Time: 1055-1140 OT Time Calculation (min): 45 min  Charges: OT General Charges $OT Visit: 1 Visit OT Treatments $Self Care/Home Management :  38-52 mins     Hortencia Pilar 12/25/2017, 1:28 PM

## 2017-12-25 NOTE — Care Management Important Message (Signed)
Important Message  Patient Details  Name: Tiffany [name] MRN: [id number] Date of Birth: Nov 10, 1926   Medicare Important Message Given:  Yes    Shauntea Lok [address] 12/25/2017, 1:48 PM

## 2017-12-26 DIAGNOSIS — I6310 Cerebral infarction due to embolism of unspecified precerebral artery: Secondary | ICD-10-CM | POA: Diagnosis not present

## 2017-12-26 DIAGNOSIS — D696 Thrombocytopenia, unspecified: Secondary | ICD-10-CM | POA: Diagnosis not present

## 2017-12-26 DIAGNOSIS — N183 Chronic kidney disease, stage 3 (moderate): Secondary | ICD-10-CM | POA: Diagnosis not present

## 2017-12-26 DIAGNOSIS — M6281 Muscle weakness (generalized): Secondary | ICD-10-CM | POA: Diagnosis not present

## 2017-12-26 DIAGNOSIS — I10 Essential (primary) hypertension: Secondary | ICD-10-CM | POA: Diagnosis not present

## 2017-12-26 DIAGNOSIS — Z743 Need for continuous supervision: Secondary | ICD-10-CM | POA: Diagnosis not present

## 2017-12-26 DIAGNOSIS — R279 Unspecified lack of coordination: Secondary | ICD-10-CM | POA: Diagnosis not present

## 2017-12-26 DIAGNOSIS — R2689 Other abnormalities of gait and mobility: Secondary | ICD-10-CM | POA: Diagnosis not present

## 2017-12-26 DIAGNOSIS — R4182 Altered mental status, unspecified: Secondary | ICD-10-CM | POA: Diagnosis not present

## 2017-12-26 DIAGNOSIS — I63511 Cerebral infarction due to unspecified occlusion or stenosis of right middle cerebral artery: Secondary | ICD-10-CM | POA: Diagnosis not present

## 2017-12-26 DIAGNOSIS — R197 Diarrhea, unspecified: Secondary | ICD-10-CM | POA: Diagnosis not present

## 2017-12-26 DIAGNOSIS — R488 Other symbolic dysfunctions: Secondary | ICD-10-CM | POA: Diagnosis not present

## 2017-12-26 DIAGNOSIS — J449 Chronic obstructive pulmonary disease, unspecified: Secondary | ICD-10-CM | POA: Diagnosis not present

## 2017-12-26 DIAGNOSIS — W19XXXA Unspecified fall, initial encounter: Secondary | ICD-10-CM | POA: Diagnosis not present

## 2017-12-26 DIAGNOSIS — R002 Palpitations: Secondary | ICD-10-CM | POA: Diagnosis not present

## 2017-12-26 DIAGNOSIS — R41841 Cognitive communication deficit: Secondary | ICD-10-CM | POA: Diagnosis not present

## 2017-12-26 DIAGNOSIS — R278 Other lack of coordination: Secondary | ICD-10-CM | POA: Diagnosis not present

## 2017-12-26 MED ORDER — ASPIRIN 81 MG PO TBEC: 81.0000 mg | DELAYED_RELEASE_TABLET | Freq: Every day | ORAL | 0 refills | Status: DC

## 2017-12-26 MED ORDER — ATORVASTATIN CALCIUM 40 MG PO TABS: 40.0000 mg | ORAL_TABLET | Freq: Every day | ORAL | 0 refills | Status: DC

## 2017-12-26 MED ORDER — AMLODIPINE BESYLATE 5 MG PO TABS: 5.0000 mg | ORAL_TABLET | Freq: Every day | ORAL | 0 refills | Status: DC

## 2017-12-26 MED ORDER — CLOPIDOGREL BISULFATE 75 MG PO TABS: 75.0000 mg | ORAL_TABLET | Freq: Every day | ORAL | 0 refills | Status: AC

## 2017-12-26 MED ORDER — AMLODIPINE BESYLATE 5 MG PO TABS: 5.0000 mg | ORAL_TABLET | Freq: Every day | ORAL | Status: DC

## 2017-12-26 NOTE — Progress Notes (Signed)
Physical Therapy Treatment Patient Details Name: Tiffany [name] MRN: [id number] DOB: Jun 04, 1926 Today's Date: 12/26/2017    History of Present Illness 84 y.o. female s/p fall at independent living. MRI+ R MCA infarct PMH HTN, hypothyroidism    PT Comments    Patient progressing slowly towards PT goals. Demonstrates poor awareness of deficits-left inattention and possible visual deficits. Son to bring glasses today. Vision to be further assessed at next venue. Requires Min A for balance/stability during gait training. Fatigues quickly with mobility requiring seated rest breaks. Appropriate for SNF. Will follow.   Follow Up Recommendations  SNF;Supervision/Assistance - 24 hour     Equipment Recommendations  None recommended by PT    Recommendations for Other Services       Precautions / Restrictions Precautions Precautions: Fall Restrictions Weight Bearing Restrictions: No    Mobility  Bed Mobility Overal bed mobility: Needs Assistance Bed Mobility: Supine to Sit;Sit to Supine     Supine to sit: Min assist;HOB elevated Sit to supine: HOB elevated;Min guard      Transfers Overall transfer level: Needs assistance Equipment used: Rolling walker (2 wheeled) Transfers: Sit to/from Stand Sit to Stand: Min guard         General transfer comment: Min guard for safety from EOB, from chair x1. Cues for RW managment.  Ambulation/Gait Ambulation/Gait assistance: Min assist Gait Distance (Feet): 100 Feet Assistive device: Rolling walker (2 wheeled) Gait Pattern/deviations: Antalgic;Trunk flexed;Drifts right/left;Narrow base of support;Step-through pattern Gait velocity: unsafe speed   General Gait Details: Pt with flexed trunk, bil knee instability and cues for RW proximity/management. Veers to left of RW. Cues to decrease speed. Min a for balance. Difficulty with turns.    Stairs             Wheelchair Mobility    Modified Rankin (Stroke Patients  Only) Modified Rankin (Stroke Patients Only) Pre-Morbid Rankin Score: No significant disability Modified Rankin: Moderately severe disability     Balance Overall balance assessment: Needs assistance Sitting-balance support: Feet supported;No upper extremity supported Sitting balance-Leahy Scale: Good     Standing balance support: During functional activity;Bilateral upper extremity supported Standing balance-Leahy Scale: Poor Standing balance comment: reliance on RW for upright support; fatigues quickly                            Cognition Arousal/Alertness: Awake/alert Behavior During Therapy: WFL for tasks assessed/performed Overall Cognitive Status: No family/caregiver present to determine baseline cognitive functioning Area of Impairment: Memory;Awareness;Safety/judgement                     Memory: Decreased short-term memory   Safety/Judgement: Decreased awareness of deficits;Decreased awareness of safety Awareness: Intellectual   General Comments: Easily distracted. Seems to go on tangents with speech. poor awareness of deficits- left inattention noted and possible visual deficits. Son to bring glasses today.      Exercises      General Comments General comments (skin integrity, edema, etc.): Consider visual deficits and possible left inattention.      Pertinent Vitals/Pain Pain Assessment: No/denies pain    Home Living                      Prior Function            PT Goals (current goals can now be found in the care plan section) Progress towards PT goals: Progressing toward goals    Frequency  Min 3X/week      PT Plan Current plan remains appropriate    Co-evaluation              AM-PAC PT "6 Clicks" Daily Activity  Outcome Measure  Difficulty turning over in bed (including adjusting bedclothes, sheets and blankets)?: None Difficulty moving from lying on back to sitting on the side of the bed? :  Unable Difficulty sitting down on and standing up from a chair with arms (e.g., wheelchair, bedside commode, etc,.)?: Unable Help needed moving to and from a bed to chair (including a wheelchair)?: A Little Help needed walking in hospital room?: A Little Help needed climbing 3-5 steps with a railing? : A Lot 6 Click Score: 14    End of Session Equipment Utilized During Treatment: Gait belt Activity Tolerance: Patient limited by fatigue Patient left: in bed;with call bell/phone within reach;with bed alarm set Nurse Communication: Mobility status PT Visit Diagnosis: Other abnormalities of gait and mobility (R26.89);History of falling (Z91.81);Other symptoms and signs involving the nervous system (R29.898)     Time: 0828-0900 PT Time Calculation (min) (ACUTE ONLY): 32 min  Charges:  $Gait Training: 8-22 mins $Therapeutic Activity: 8-22 mins                     [place], [place], [place] [phone]     Lacie Draft 12/26/2017, 9:23 AM

## 2017-12-26 NOTE — Discharge Summary (Signed)
Physician Discharge Summary  Tiffany [name] VPX:[id number] DOB: 04-27-1927 DOA: 12/21/2017  PCP: Donald Prose, MD  Admit date: 12/21/2017 Discharge date: 12/26/2017  Admitted From: Home Disposition:  Home  Discharge Condition:Stable CODE STATUS:FULL Diet recommendation: Heart Healthy  Brief/Interim Summary: Patient is 84 year old female with past medical history of hypertension, hypothyroidism who presented with fall and weakness from independent living facility.  She was found to have elevated CK.  X-rays were unremarkable for the fractures.  MRI of the brain showed acute ischemic stroke.  Neurology was following.  PT/OT evaluation done and recommended skilled nursing facility on discharge.  Patient is medically stable for discharge to skilled nursing facility today.  Following problems were addressed during her hospitalization:  Acute right-sided ischemic infarction: MRI of the brain showed moderate right MCA territory infarct.  Patient presented with bilateral lower extremity weakness more on the left.  Neurology was following.  Continue aspirin and Plavix for total of  3 weeks and then continue aspirin only.  She is already on Plavix and aspirin here. Echocardiograms did not show any wall motion abnormalities, ejection fraction of 60 to 65%. Carotid Dopplers did not show any significant carotid artery stenosis.  Hemoglobin A1c is 6.0. LDL of 83 . Lipitor added. Speech therapy evaluated  the patient.  PT/OT evaluation done. Follow-up with neurology on discharge.  Falls/generalized weakness/deconditioning: No acute fractures or injuries on the x-rays.  PT/OT were  following.  Rhabdomyolysis:Due to fall.  CK level back to baseline  Leukocytosis: Most likely reactive.  Resolved  Thrombocytopenia: Likely chronic.  No signs of bleeding.  CKD stage III: Creatinine on baseline.  Hypertension: Restarted home medications.  Hypothyroidism: Continue Synthyroid.     Discharge  Diagnoses:  Principal Problem:   Fall Active Problems:   HTN (hypertension)   Generalized weakness   Rhabdomyolysis   CKD (chronic kidney disease) stage 3, GFR 30-59 ml/min (HCC)   Hypothyroidism   Pressure injury of skin   Cerebral embolism with cerebral infarction    Discharge Instructions  Discharge Instructions    Ambulatory referral to Neurology   Complete by:  As directed    An appointment is requested in approximately: 4 weeks   Diet - low sodium heart healthy   Complete by:  As directed    Discharge instructions   Complete by:  As directed    1)Take prescribed medications as instructed. 2)Follow up with neurology as an outpatient in 4 weeks. 3)Take aspirin and plavix for total of 21 days then continue on aspirin only.   Increase activity slowly   Complete by:  As directed      Allergies as of 12/26/2017      Reactions   Codeine Nausea Only   Morphine And Related Anxiety, Other (See Comments)   Feel very unwell and out there   Sulfa Antibiotics Hives, Itching      Medication List    TAKE these medications   amLODipine 5 MG tablet Commonly known as:  NORVASC Take 1 tablet (5 mg total) by mouth daily.   aspirin 81 MG EC tablet Take 1 tablet (81 mg total) by mouth daily. Start taking on:  12/27/2017   atorvastatin 40 MG tablet Commonly known as:  LIPITOR Take 1 tablet (40 mg total) by mouth daily. Start taking on:  12/27/2017   clopidogrel 75 MG tablet Commonly known as:  PLAVIX Take 1 tablet (75 mg total) by mouth daily for 19 days. Start taking on:  12/27/2017   irbesartan 300 MG tablet  Commonly known as:  AVAPRO Take 300 mg by mouth every morning.   levothyroxine 88 MCG tablet Commonly known as:  SYNTHROID, LEVOTHROID Take 88 mcg by mouth daily before breakfast.   metoprolol tartrate 50 MG tablet Commonly known as:  LOPRESSOR Take 50 mg by mouth 2 (two) times daily.       Contact information for follow-up providers    Garvin Fila, MD.  Schedule an appointment as soon as possible for a visit in 4 week(s).   Specialties:  Neurology, Radiology Contact information: [address] [place] [address] [phone]            Contact information for after-discharge care    Destination    [facility] .   Service:  Skilled Nursing Contact information: [place] [address] [phone]                 Allergies  Allergen Reactions  . Codeine Nausea Only  . Morphine And Related Anxiety and Other (See Comments)    Feel very unwell and out there  . Sulfa Antibiotics Hives and Itching    Consultations: Neurology  Procedures/Studies: Dg Thoracic Spine 2 View  Result Date: 12/21/2017 CLINICAL DATA:  Fall a few days ago with mid back pain. EXAM: THORACIC SPINE 2 VIEWS COMPARISON:  05/21/2016 FINDINGS: Mild curvature of the thoracic spine convex left unchanged. There are degenerative changes of the thoracic spine. Pedicles are intact. The lower thoracic spine is not well visualized on the lateral view due in part to the curvature. No definite acute compression fracture. IMPRESSION: No acute findings. Curvature of the thoracic spine convex left unchanged with degenerative changes present. Electronically Signed   By: Marin Olp M.D.   On: 12/21/2017 15:26   Dg Lumbar Spine Complete  Result Date: 12/21/2017 CLINICAL DATA:  Fall over the past couple days with low back pain. EXAM: LUMBAR SPINE - COMPLETE 4+ VIEW COMPARISON:  05/21/2016 and CT 10/03/2012 FINDINGS: Moderate curvature of the lower thoracic/lumbar spine convex right unchanged. Moderate degenerative changes of the lumbar spine. Facet arthropathy. Disc disease at multiple levels of the lumbar spine and lower thoracic spine with exception of the L4-5 level. Mild stable L3 compression fracture. Mild depression of the superior endplate of L4 new since 2014 although likely chronic. Mild-to-moderate  anterior wedging of L1 which appears to be progressed since 2014 and likely chronic. Slight anterior wedging of T11 and T12 with mild progression 2014 although likely chronic. IMPRESSION: No definite acute findings. Stable L3 compression fracture. Interval progression of mild compression deformities of T11, T12 and L1 since 2014 and likely chronic. Depression of the superior endplate of L4 likely chronic. Moderate spondylosis of the lumbar spine to include facet arthropathy. Multilevel disc disease. Curvature convex right. Electronically Signed   By: Marin Olp M.D.   On: 12/21/2017 15:33   Ct Head Wo Contrast  Result Date: 12/22/2017 CLINICAL DATA:  Headache EXAM: CT HEAD WITHOUT CONTRAST TECHNIQUE: Contiguous axial images were obtained from the base of the skull through the vertex without intravenous contrast. COMPARISON:  None. FINDINGS: Brain: Image quality degraded by extensive motion. Multiple repeat images or performed. Hypodensity right parietal cortex, most likely due to acute infarct. Generalized atrophy with extensive chronic microvascular ischemic change throughout the white matter. Negative for hemorrhage. Vascular: Negative for hyperdense vessel Skull: Negative Sinuses/Orbits: Paranasal sinuses clear.  Bilateral cataract surgery Other: None IMPRESSION: Hypodensity right parietal cortex most likely acute infarct. Mass  lesion not excluded. MRI suggested. However, given the extensive motion on the current study, sedation will be required for the MRI Atrophy and extensive chronic microvascular ischemia in the white matter. Electronically Signed   By: Franchot Gallo M.D.   On: 12/22/2017 11:40   Mr Brain Wo Contrast  Result Date: 12/22/2017 CLINICAL DATA:  Recent fall. Found down this morning. Follow up abnormal head CT. History of breast cancer, hypertension. EXAM: MRI HEAD WITHOUT CONTRAST TECHNIQUE: Multiplanar, multiecho pulse sequences of the brain and surrounding structures were obtained  without intravenous contrast. Axial T1 and coronal T2 sequences not obtained due to patient motion; patient removed head coil and could not continue examination. COMPARISON:  CT HEAD December 22, 2016 FINDINGS: INTRACRANIAL CONTENTS: Patchy reduced diffusion RIGHT parietal lobe, periventricular white matter and posterior insula with corresponding low ADC values. No susceptibility artifact to suggest hemorrhage. No midline shift, mass effect or definite masses though limited by motion. Confluent supratentorial and patchy pontine white matter FLAIR T2 hyperintensities. Prominent basal ganglia and thalami perivascular spaces in hazy T2 hyperintense signal so seen with chronic small vessel ischemic changes. No parenchymal brain volume loss for age. No hydrocephalus. No abnormal extra-axial fluid collections. VASCULAR: Major intracranial vascular flow voids present at skull base. Dolichoectatic intracranial vessels associated with chronic hypertension. SKULL AND UPPER CERVICAL SPINE: No abnormal sellar expansion. No suspicious calvarial bone marrow signal. Craniocervical junction maintained. SINUSES/ORBITS: The mastoid air-cells and included paranasal sinuses are well-aerated.The included ocular globes and orbital contents are non-suspicious. Status post bilateral ocular lens implants. OTHER: None. IMPRESSION: 1. Moderately motion degraded truncated examination. 2. Moderate RIGHT MCA territory nonhemorrhagic infarct corresponding to CT abnormality. 3. Severe chronic small vessel ischemic changes. Electronically Signed   By: Elon Alas M.D.   On: 12/22/2017 14:04   Dg Knee Complete 4 Views Left  Result Date: 12/21/2017 CLINICAL DATA:  Fall over the past couple days with left knee pain. EXAM: LEFT KNEE - COMPLETE 4+ VIEW COMPARISON:  None. FINDINGS: Diffuse osteopenia. Mild osteoarthritic change. No acute fracture or dislocation. No significant joint effusion. IMPRESSION: No acute fracture. Electronically Signed    By: Marin Olp M.D.   On: 12/21/2017 15:40   Dg Knee Complete 4 Views Right  Result Date: 12/21/2017 CLINICAL DATA:  Fall over the past few days with right knee pain. EXAM: RIGHT KNEE - COMPLETE 4+ VIEW COMPARISON:  None. FINDINGS: Diffuse osteopenia. Total right knee arthroplasty intact and normally located. No acute fracture, dislocation or significant joint effusion. IMPRESSION: No acute findings. Electronically Signed   By: Marin Olp M.D.   On: 12/21/2017 15:41   Dg Hips Bilat W Or Wo Pelvis 3-4 Views  Result Date: 12/21/2017 CLINICAL DATA:  Fall over the past few days. EXAM: DG HIP (WITH OR WITHOUT PELVIS) 3-4V BILAT COMPARISON:  CT 10/03/2012 FINDINGS: Left hip arthroplasty intact with mild protrusio acetabuli. Mild diffuse osteopenia. Mild degenerative change of the right hip and moderate degenerate change of the spine. No acute fracture or dislocation. IMPRESSION: No acute fracture or dislocation. Mild degenerative change of the right hip. Left total of arthroplasty intact with mild protrusio acetabuli. Electronically Signed   By: Marin Olp M.D.   On: 12/21/2017 15:39      Subjective: Patient seen and examined at bedside this morning.  Remains comfortable.  No new issues/events.  Stable for discharge today.  Discharge Exam: Vitals:   12/26/17 0459 12/26/17 0752  BP: (!) 165/46 (!) 177/64  Pulse: 60 66  Resp: 16  16  Temp: 98 F (36.7 C) (!) 97.5 F (36.4 C)  SpO2: 96% 97%   Vitals:   12/25/17 2021 12/25/17 2350 12/26/17 0459 12/26/17 0752  BP: (!) 162/74 (!) 151/47 (!) 165/46 (!) 177/64  Pulse: 74 (!) 54 60 66  Resp: 20 18 16 16   Temp: 98.3 F (36.8 C) (!) 97.3 F (36.3 C) 98 F (36.7 C) (!) 97.5 F (36.4 C)  TempSrc: Oral Oral Oral Oral  SpO2: 95% 97% 96% 97%  Weight:      Height:        General: Pt is alert, awake, not in acute distress Cardiovascular: RRR, S1/S2 +, no rubs, no gallops Respiratory: CTA bilaterally, no wheezing, no rhonchi Abdominal:  Soft, NT, ND, bowel sounds + Extremities: no edema, no cyanosis    The results of significant diagnostics from this hospitalization (including imaging, microbiology, ancillary and laboratory) are listed below for reference.     Microbiology: Recent Results (from the past 240 hour(s))  Culture, Urine     Status: Abnormal   Collection Time: 12/22/17  9:58 AM  Result Value Ref Range Status   Specimen Description   Final    URINE, CLEAN CATCH Performed at [facility], [place] [address], [place], [place] [address]    Special Requests   Final    NONE Performed at [facility], [place] [address], [place], [place] [address]    Culture MULTIPLE SPECIES PRESENT, SUGGEST RECOLLECTION (A)  Final   Report Status 12/23/2017 FINAL  Final     Labs: BNP (last 3 results) No results for input(s): BNP in the last 8760 hours. Basic Metabolic Panel: Recent Labs  Lab 12/21/17 1407 12/22/17 0524  NA 145 143  K 3.9 3.6  CL 111 116*  CO2 27 24  GLUCOSE 156* 104*  BUN 31* 30*  CREATININE 1.04* 0.93  CALCIUM 9.3 8.3*   Liver Function Tests: No results for input(s): AST, ALT, ALKPHOS, BILITOT, PROT, ALBUMIN in the last 168 hours. No results for input(s): LIPASE, AMYLASE in the last 168 hours. No results for input(s): AMMONIA in the last 168 hours. CBC: Recent Labs  Lab 12/21/17 1407 12/22/17 0524  WBC 12.4* 9.0  HGB 14.6 11.3*  HCT 43.8 35.3*  MCV 91.1 93.1  PLT 171 134*   Cardiac Enzymes: Recent Labs  Lab 12/21/17 1407 12/22/17 0524 12/24/17 0441  CKTOTAL 3,063* 819* 123   BNP: Invalid input(s): POCBNP CBG: No results for input(s): GLUCAP in the last 168 hours. D-Dimer No results for input(s): DDIMER in the last 72 hours. Hgb A1c Recent Labs    12/23/17 1036  HGBA1C 6.0*   Lipid Profile Recent Labs    12/23/17 1036  CHOL 142  HDL 43  LDLCALC 83  TRIG 81  CHOLHDL 3.3   Thyroid function studies No results for input(s): TSH,  T4TOTAL, T3FREE, THYROIDAB in the last 72 hours.  Invalid input(s): FREET3 Anemia work up No results for input(s): VITAMINB12, FOLATE, FERRITIN, TIBC, IRON, RETICCTPCT in the last 72 hours. Urinalysis    Component Value Date/Time   COLORURINE YELLOW 02/09/2015 0158   APPEARANCEUR CLOUDY (A) 02/09/2015 0158   LABSPEC 1.015 02/09/2015 0158   PHURINE 6.5 02/09/2015 0158   GLUCOSEU NEGATIVE 02/09/2015 0158   HGBUR NEGATIVE 02/09/2015 0158   BILIRUBINUR NEGATIVE 02/09/2015 0158   KETONESUR NEGATIVE 02/09/2015 0158   PROTEINUR NEGATIVE 02/09/2015 0158   UROBILINOGEN 0.2 02/09/2015 0158   NITRITE NEGATIVE 02/09/2015 0158   LEUKOCYTESUR MODERATE (  A) 02/09/2015 0158   Sepsis Labs Invalid input(s): PROCALCITONIN,  WBC,  LACTICIDVEN Microbiology Recent Results (from the past 240 hour(s))  Culture, Urine     Status: Abnormal   Collection Time: 12/22/17  9:58 AM  Result Value Ref Range Status   Specimen Description   Final    URINE, CLEAN CATCH Performed at [facility], [place] [address] [place], [place] [address]    Special Requests   Final    NONE Performed at [facility], [place] [address] [place], [place] [address]    Culture MULTIPLE SPECIES PRESENT, SUGGEST RECOLLECTION (A)  Final   Report Status 12/23/2017 FINAL  Final    Please note: You were cared for by a hospitalist during your hospital stay. Once you are discharged, your primary care physician will handle any further medical issues. Please note that NO REFILLS for any discharge medications will be authorized once you are discharged, as it is imperative that you return to your primary care physician (or establish a relationship with a primary care physician if you do not have one) for your post hospital discharge needs so that they can reassess your need for medications and monitor your lab values.    Time coordinating discharge: 40 minutes  SIGNED:   Shelly Coss, MD  [facility] 12/26/2017, 8:38 AM Pager [phone]  If 7PM-7AM, please contact night-coverage [email or web address] Password [email or web address]

## 2017-12-26 NOTE — Clinical Social Work Placement (Signed)
Nurse to call report to [phone], Room 904P     CLINICAL SOCIAL WORK PLACEMENT  NOTE  Date:  12/26/2017  Patient Details  Name: Tiffany [name] MRN: [id number] Date of Birth: Oct 03, 1926  Clinical Social Work is seeking post-discharge placement for this patient at the [place] level of care (*CSW will initial, date and re-position this form in  chart as items are completed):  Yes   Patient/family provided with [facility]'s list of facilities offering this level of care within the geographic area requested by the patient (or if unable, by the patient's family).  Yes   Patient/family informed of their freedom to choose among providers that offer the needed level of care, that participate in Medicare, Medicaid or managed care program needed by the patient, have an available bed and are willing to accept the patient.  Yes   Patient/family informed of [facility]'s ownership interest in [facility] and [facility], as well as of the fact that they are under no obligation to receive care at these facilities.  PASRR submitted to EDS on 12/24/17     PASRR number received on 12/24/17     Existing PASRR number confirmed on       FL2 transmitted to all facilities in geographic area requested by pt/family on 12/24/17     FL2 transmitted to all facilities within larger geographic area on       Patient informed that his/her managed care company has contracts with or will negotiate with certain facilities, including the following:        Yes   Patient/family informed of bed offers received.  Patient chooses bed at [facility]     Physician recommends and patient chooses bed at      Patient to be transferred to [facility] on 12/26/17.  Patient to be transferred to facility by [facility]     Patient family notified on 12/26/17 of transfer.  Name of family member notified:  [name]     PHYSICIAN       Additional Comment:     _______________________________________________ Geralynn Ochs, LCSW 12/26/2017, 12:14 PM

## 2017-12-26 NOTE — Plan of Care (Signed)
Adequate for discharge.

## 2017-12-26 NOTE — Care Management Important Message (Signed)
Important Message  Patient Details  Name: Tiffany [name] MRN: [id number] Date of Birth: 27-Mar-1927   Medicare Important Message Given:  Yes    Nami Strawder [address] 12/26/2017, 3:04 PM

## 2017-12-26 NOTE — Consult Note (Signed)
             [facility] CM Primary Care Navigator  12/26/2017  Alanie Syler 1926/07/21 [id number]   Went to see patientat the bedside to identify possible discharge needs butshe wasalreadydischarged perstaff report. Patientwas discharged to skilled nursing facility per therapy recommendation ([facility]).  Per chart review,patient presented with fall and weakness from independent living facility [facility]). MRI of the brain showed acute ischemic stroke and was followed by neurology. (Acute right-sided ischemic infarction)  Primary care provider's office is listed as providing transition of care (TOC) follow-up.  Patient has discharge instruction to follow-up withneurology in 4 weeks.    For additional questions please contact:  Edwena Felty A. Mark Hassey, BSN, RN-BC [facility] PRIMARY CARE Navigator Cell: [phone]

## 2017-12-29 DIAGNOSIS — I63511 Cerebral infarction due to unspecified occlusion or stenosis of right middle cerebral artery: Secondary | ICD-10-CM | POA: Diagnosis not present

## 2017-12-29 DIAGNOSIS — D696 Thrombocytopenia, unspecified: Secondary | ICD-10-CM | POA: Diagnosis not present

## 2017-12-29 DIAGNOSIS — N183 Chronic kidney disease, stage 3 (moderate): Secondary | ICD-10-CM | POA: Diagnosis not present

## 2018-01-12 DIAGNOSIS — N183 Chronic kidney disease, stage 3 (moderate): Secondary | ICD-10-CM | POA: Diagnosis not present

## 2018-01-12 DIAGNOSIS — I10 Essential (primary) hypertension: Secondary | ICD-10-CM | POA: Diagnosis not present

## 2018-01-12 DIAGNOSIS — R488 Other symbolic dysfunctions: Secondary | ICD-10-CM | POA: Diagnosis not present

## 2018-01-12 DIAGNOSIS — M6281 Muscle weakness (generalized): Secondary | ICD-10-CM | POA: Diagnosis not present

## 2018-01-12 DIAGNOSIS — R278 Other lack of coordination: Secondary | ICD-10-CM | POA: Diagnosis not present

## 2018-01-12 DIAGNOSIS — I6310 Cerebral infarction due to embolism of unspecified precerebral artery: Secondary | ICD-10-CM | POA: Diagnosis not present

## 2018-01-12 DIAGNOSIS — R2689 Other abnormalities of gait and mobility: Secondary | ICD-10-CM | POA: Diagnosis not present

## 2018-01-12 DIAGNOSIS — R41841 Cognitive communication deficit: Secondary | ICD-10-CM | POA: Diagnosis not present

## 2018-01-12 DIAGNOSIS — R197 Diarrhea, unspecified: Secondary | ICD-10-CM | POA: Diagnosis not present

## 2018-01-20 ENCOUNTER — Emergency Department (HOSPITAL_COMMUNITY): Payer: Medicare HMO

## 2018-01-20 ENCOUNTER — Emergency Department (HOSPITAL_COMMUNITY)
Admission: EM | Admit: 2018-01-20 | Discharge: 2018-01-21 | Disposition: A | Discharge status: Home / Self Care | Payer: Medicare HMO | Attending: Emergency Medicine | Admitting: Emergency Medicine

## 2018-01-20 ENCOUNTER — Other Ambulatory Visit: Payer: Self-pay

## 2018-01-20 ENCOUNTER — Encounter (HOSPITAL_COMMUNITY): Payer: Self-pay

## 2018-01-20 DIAGNOSIS — S3993XA Unspecified injury of pelvis, initial encounter: Secondary | ICD-10-CM | POA: Diagnosis present

## 2018-01-20 DIAGNOSIS — N183 Chronic kidney disease, stage 3 (moderate): Secondary | ICD-10-CM | POA: Diagnosis not present

## 2018-01-20 DIAGNOSIS — W19XXXA Unspecified fall, initial encounter: Secondary | ICD-10-CM | POA: Diagnosis not present

## 2018-01-20 DIAGNOSIS — Y929 Unspecified place or not applicable: Secondary | ICD-10-CM | POA: Insufficient documentation

## 2018-01-20 DIAGNOSIS — Y999 Unspecified external cause status: Secondary | ICD-10-CM | POA: Diagnosis not present

## 2018-01-20 DIAGNOSIS — Z8673 Personal history of transient ischemic attack (TIA), and cerebral infarction without residual deficits: Secondary | ICD-10-CM | POA: Insufficient documentation

## 2018-01-20 DIAGNOSIS — Y939 Activity, unspecified: Secondary | ICD-10-CM | POA: Insufficient documentation

## 2018-01-20 DIAGNOSIS — I129 Hypertensive chronic kidney disease with stage 1 through stage 4 chronic kidney disease, or unspecified chronic kidney disease: Secondary | ICD-10-CM | POA: Diagnosis not present

## 2018-01-20 DIAGNOSIS — R197 Diarrhea, unspecified: Secondary | ICD-10-CM | POA: Diagnosis not present

## 2018-01-20 DIAGNOSIS — Z853 Personal history of malignant neoplasm of breast: Secondary | ICD-10-CM | POA: Insufficient documentation

## 2018-01-20 DIAGNOSIS — N200 Calculus of kidney: Secondary | ICD-10-CM | POA: Diagnosis not present

## 2018-01-20 DIAGNOSIS — E039 Hypothyroidism, unspecified: Secondary | ICD-10-CM | POA: Insufficient documentation

## 2018-01-20 DIAGNOSIS — S32302A Unspecified fracture of left ilium, initial encounter for closed fracture: Secondary | ICD-10-CM | POA: Diagnosis not present

## 2018-01-20 DIAGNOSIS — Z9013 Acquired absence of bilateral breasts and nipples: Secondary | ICD-10-CM | POA: Insufficient documentation

## 2018-01-20 DIAGNOSIS — K922 Gastrointestinal hemorrhage, unspecified: Secondary | ICD-10-CM | POA: Diagnosis not present

## 2018-01-20 DIAGNOSIS — R1084 Generalized abdominal pain: Secondary | ICD-10-CM | POA: Diagnosis not present

## 2018-01-20 DIAGNOSIS — I10 Essential (primary) hypertension: Secondary | ICD-10-CM | POA: Diagnosis not present

## 2018-01-20 DIAGNOSIS — N201 Calculus of ureter: Secondary | ICD-10-CM | POA: Diagnosis not present

## 2018-01-20 DIAGNOSIS — R52 Pain, unspecified: Secondary | ICD-10-CM | POA: Diagnosis not present

## 2018-01-20 DIAGNOSIS — R110 Nausea: Secondary | ICD-10-CM | POA: Diagnosis not present

## 2018-01-20 HISTORY — DX: Cerebral infarction, unspecified: I63.9

## 2018-01-20 LAB — URINALYSIS, ROUTINE W REFLEX MICROSCOPIC

## 2018-01-20 LAB — CBC WITH DIFFERENTIAL/PLATELET
Basophils Absolute: 0 10*3/uL (ref 0.0–0.1)
Basophils Relative: 1 %
Eosinophils Absolute: 0.1 10*3/uL (ref 0.0–0.7)
Eosinophils Relative: 2 %
HCT: 42.3 % (ref 36.0–46.0)
Hemoglobin: 14 g/dL (ref 12.0–15.0)
Lymphocytes Relative: 19 %
Lymphs Abs: 1.3 10*3/uL (ref 0.7–4.0)
MCH: 30.7 pg (ref 26.0–34.0)
MCHC: 33.1 g/dL (ref 30.0–36.0)
MCV: 92.8 fL (ref 78.0–100.0)
Monocytes Absolute: 0.6 10*3/uL (ref 0.1–1.0)
Monocytes Relative: 9 %
Neutro Abs: 4.7 10*3/uL (ref 1.7–7.7)
Neutrophils Relative %: 69 %
Platelets: 174 10*3/uL (ref 150–400)
RBC: 4.56 MIL/uL (ref 3.87–5.11)
RDW: 13.4 % (ref 11.5–15.5)
WBC: 6.7 10*3/uL (ref 4.0–10.5)

## 2018-01-20 LAB — COMPREHENSIVE METABOLIC PANEL
ALT: 9 U/L (ref 0–44)
AST: 20 U/L (ref 15–41)
Albumin: 3.9 g/dL (ref 3.5–5.0)
Alkaline Phosphatase: 96 U/L (ref 38–126)
Anion gap: 12 (ref 5–15)
BUN: 19 mg/dL (ref 8–23)
CO2: 26 mmol/L (ref 22–32)
Calcium: 10.2 mg/dL (ref 8.9–10.3)
Chloride: 106 mmol/L (ref 98–111)
Creatinine, Ser: 1.03 mg/dL — ABNORMAL HIGH (ref 0.44–1.00)
GFR calc Af Amer: 54 mL/min — ABNORMAL LOW (ref >60)
GFR calc non Af Amer: 47 mL/min — ABNORMAL LOW (ref >60)
Glucose, Bld: 137 mg/dL — ABNORMAL HIGH (ref 70–99)
Potassium: 4 mmol/L (ref 3.5–5.1)
Sodium: 144 mmol/L (ref 135–145)
Total Bilirubin: 1 mg/dL (ref 0.3–1.2)
Total Protein: 6.8 g/dL (ref 6.5–8.1)

## 2018-01-20 LAB — URINALYSIS, MICROSCOPIC (REFLEX)
Bacteria, UA: NONE SEEN
RBC / HPF: >50 RBC/hpf (ref 0–5)
Squamous Epithelial / LPF: NONE SEEN (ref 0–5)

## 2018-01-20 LAB — PROTIME-INR
INR: 0.96
Prothrombin Time: 12.7 seconds (ref 11.4–15.2)

## 2018-01-20 LAB — POC OCCULT BLOOD, ED: Fecal Occult Bld: NEGATIVE

## 2018-01-20 MED ORDER — SODIUM CHLORIDE 0.9 % IV SOLN
8.0000 mg/h | INTRAVENOUS | Status: DC
  Filled 2018-01-20 (×2): qty 80

## 2018-01-20 MED ORDER — HYDROCODONE-ACETAMINOPHEN 5-325 MG PO TABS: 1.0000 | ORAL_TABLET | ORAL | 0 refills | Status: DC | PRN

## 2018-01-20 MED ORDER — FENTANYL CITRATE (PF) 100 MCG/2ML IJ SOLN
25.0000 ug | Freq: Once | INTRAMUSCULAR | Status: AC
  Administered 2018-01-20: 25 ug via INTRAVENOUS
  Filled 2018-01-20: qty 2

## 2018-01-20 MED ORDER — SODIUM CHLORIDE 0.9 % IV SOLN
INTRAVENOUS | Status: DC
  Administered 2018-01-20: 21:00:00 via INTRAVENOUS

## 2018-01-20 MED ORDER — ONDANSETRON 4 MG PO TBDP: 4.0000 mg | ORAL_TABLET | Freq: Three times a day (TID) | ORAL | 0 refills | Status: DC | PRN

## 2018-01-20 MED ORDER — IOPAMIDOL (ISOVUE-300) INJECTION 61%
INTRAVENOUS | Status: AC
  Filled 2018-01-20: qty 100

## 2018-01-20 MED ORDER — SODIUM CHLORIDE 0.9 % IV SOLN
80.0000 mg | Freq: Once | INTRAVENOUS | Status: AC
  Administered 2018-01-20: 80 mg via INTRAVENOUS
  Filled 2018-01-20: qty 80

## 2018-01-20 MED ORDER — ONDANSETRON HCL 4 MG/2ML IJ SOLN
4.0000 mg | Freq: Once | INTRAMUSCULAR | Status: AC
  Administered 2018-01-20: 4 mg via INTRAVENOUS
  Filled 2018-01-20: qty 2

## 2018-01-20 MED ORDER — IOPAMIDOL (ISOVUE-300) INJECTION 61%
100.0000 mL | Freq: Once | INTRAVENOUS | Status: AC | PRN
  Administered 2018-01-20: 100 mL via INTRAVENOUS

## 2018-01-20 NOTE — ED Provider Notes (Signed)
[place] DEPT Provider Note   CSN: [id number] Arrival date & time: 01/20/18  2005     History   Chief Complaint Chief Complaint  Patient presents with  . Abdominal Pain    HPI Tiffany [name] is a 84 y.o. female.  Pt presents to the ED today with abdominal pain.  The pt said she had bloody diarrhea this morning, then developed severe cramping.  She said it felt like childbirth.  She said the pain has improved, but it is still there.  The pt denies f/c or vomiting.  She said she did fall a few days ago and hurt her back.  That is hurting a little bit now.     Past Medical History:  Diagnosis Date  . Arthritis   . Breast cancer ([place])   . Colon polyps   . Hypertension   . Hypothyroidism   . Osteoporosis   . Rectal bleeding   . Skin cancer   . Stroke [facility])     Patient Active Problem List   Diagnosis Date Noted  . Cerebral embolism with cerebral infarction 12/23/2017  . Pressure injury of skin 12/22/2017  . Fall 12/21/2017  . Generalized weakness 12/21/2017  . Rhabdomyolysis 12/21/2017  . CKD (chronic kidney disease) stage 3, GFR 30-59 ml/min (HCC) 12/21/2017  . Hypothyroidism 12/21/2017  . Palpitations 12/17/2012  . Internal bleeding hemorrhoids 10/26/2012  . Rectal bleeding 10/04/2012  . HTN (hypertension) 10/04/2012    Past Surgical History:  Procedure Laterality Date  . ABDOMINAL HYSTERECTOMY    . BILATERAL TOTAL MASTECTOMY WITH AXILLARY LYMPH NODE DISSECTION    . CATARACT EXTRACTION    . CHOLECYSTECTOMY    . COLONOSCOPY N/A 10/07/2012   Procedure: COLONOSCOPY;  Surgeon: Arta Silence, MD;  Location: [facility] ENDOSCOPY;  Service: Endoscopy;  Laterality: N/A;  . hip replacment    . HIP SURGERY    . JOINT REPLACEMENT    . MASTECTOMY    . REPLACEMENT TOTAL KNEE    . shin repair       OB History   None      Home Medications    Prior to Admission medications   Medication Sig Start Date End Date Taking? Authorizing  Provider  FIBER SELECT GUMMIES PO Take 4 tablets by mouth daily.   Yes [provider]  irbesartan (AVAPRO) 300 MG tablet Take 300 mg by mouth every morning.   Yes [provider]  levothyroxine (SYNTHROID, LEVOTHROID) 88 MCG tablet Take 88 mcg by mouth daily before breakfast.   Yes [provider]  metoprolol (LOPRESSOR) 50 MG tablet Take 50 mg by mouth 2 (two) times daily.   Yes [provider]  amLODipine (NORVASC) 5 MG tablet Take 1 tablet (5 mg total) by mouth daily. Patient not taking: Reported on 01/20/2018 12/26/17   Shelly Coss, MD  aspirin EC 81 MG EC tablet Take 1 tablet (81 mg total) by mouth daily. Patient not taking: Reported on 01/20/2018 12/27/17   Shelly Coss, MD  atorvastatin (LIPITOR) 40 MG tablet Take 1 tablet (40 mg total) by mouth daily. Patient not taking: Reported on 01/20/2018 12/27/17 01/26/18  Shelly Coss, MD  HYDROcodone-acetaminophen (NORCO/VICODIN) 5-325 MG tablet Take 1 tablet by mouth every 4 (four) hours as needed. 01/20/18   Isla Pence, MD  ondansetron (ZOFRAN ODT) 4 MG disintegrating tablet Take 1 tablet (4 mg total) by mouth every 8 (eight) hours as needed. 01/20/18   Isla Pence, MD    Family History Family  History  Problem Relation Age of Onset  . Glaucoma Mother   . Coronary artery disease Father   . Diabetes Sister   . Diabetes Brother     Social History Social History   Tobacco Use  . Smoking status: Never Smoker  . Smokeless tobacco: Never Used  Substance Use Topics  . Alcohol use: No  . Drug use: No     Allergies   Codeine; Morphine and related; and Sulfa antibiotics   Review of Systems Review of Systems  Gastrointestinal: Positive for abdominal pain and diarrhea.  All other systems reviewed and are negative.    Physical Exam Updated Vital Signs BP (!) 173/66   Pulse 63   Temp 98.3 F (36.8 C) (Oral)   Resp (!) 24   Ht 5\' 2"  (1.575 m)   Wt 71.2 kg   SpO2 100%   BMI 28.72  kg/m   Physical Exam  Constitutional: She is oriented to person, place, and time. She appears well-developed and well-nourished.  HENT:  Head: Normocephalic and atraumatic.  Mouth/Throat: Oropharynx is clear and moist.  Eyes: Pupils are equal, round, and reactive to light. EOM are normal.  Cardiovascular: Normal rate and regular rhythm.  Pulmonary/Chest: Effort normal and breath sounds normal.  Abdominal: Normal appearance and bowel sounds are normal. There is tenderness in the right lower quadrant and left lower quadrant.  Genitourinary: Rectal exam shows guaiac negative stool.  Neurological: She is alert and oriented to person, place, and time.  Skin: Skin is warm and dry. Capillary refill takes less than 2 seconds.  Psychiatric: She has a normal mood and affect. Her behavior is normal.  Nursing note and vitals reviewed.    ED Treatments / Results  Labs (all labs ordered are listed, but only abnormal results are displayed) Labs Reviewed  COMPREHENSIVE METABOLIC PANEL - Abnormal; Notable for the following components:      Result Value   Glucose, Bld 137 (*)    Creatinine, Ser 1.03 (*)    GFR calc non Af Amer 47 (*)    GFR calc Af Amer 54 (*)    All other components within normal limits  URINALYSIS, ROUTINE W REFLEX MICROSCOPIC - Abnormal; Notable for the following components:   Color, Urine RED (*)    APPearance CLOUDY (*)    Glucose, UA   (*)    Value: TEST NOT REPORTED DUE TO COLOR INTERFERENCE OF URINE PIGMENT   Hgb urine dipstick   (*)    Value: TEST NOT REPORTED DUE TO COLOR INTERFERENCE OF URINE PIGMENT   Bilirubin Urine   (*)    Value: TEST NOT REPORTED DUE TO COLOR INTERFERENCE OF URINE PIGMENT   Ketones, ur   (*)    Value: TEST NOT REPORTED DUE TO COLOR INTERFERENCE OF URINE PIGMENT   Protein, ur   (*)    Value: TEST NOT REPORTED DUE TO COLOR INTERFERENCE OF URINE PIGMENT   Nitrite   (*)    Value: TEST NOT REPORTED DUE TO COLOR INTERFERENCE OF URINE PIGMENT    Leukocytes, UA   (*)    Value: TEST NOT REPORTED DUE TO COLOR INTERFERENCE OF URINE PIGMENT   All other components within normal limits  CBC WITH DIFFERENTIAL/PLATELET  PROTIME-INR  URINALYSIS, MICROSCOPIC (REFLEX)  POC OCCULT BLOOD, ED  TYPE AND SCREEN    EKG None  Radiology Dg Chest 2 View  Result Date: 01/20/2018 CLINICAL DATA:  Nausea, upper abdominal pain EXAM: CHEST - 2 VIEW COMPARISON:  05/21/2016 FINDINGS: Linear scarring or atelectasis at the left base. No focal consolidation or effusion. The heart size is within normal limits. Aortic atherosclerosis. No pneumothorax. IMPRESSION: No active cardiopulmonary disease. Linear scarring or atelectasis at the left base. Electronically Signed   By: Donavan Foil M.D.   On: 01/20/2018 21:16   Ct Abdomen Pelvis W Contrast  Result Date: 01/20/2018 CLINICAL DATA:  Abdominal pain beginning this morning, diarrhea. GI bleed. History of hysterectomy, cholecystectomy, breast cancer, colonic polyps. EXAM: CT ABDOMEN AND PELVIS WITH CONTRAST TECHNIQUE: Multidetector CT imaging of the abdomen and pelvis was performed using the standard protocol following bolus administration of intravenous contrast. CONTRAST:  112mL ISOVUE-300 IOPAMIDOL (ISOVUE-300) INJECTION 61% COMPARISON:  PET-CT January 04, 2010 FINDINGS: LOWER CHEST: Lung bases are clear. Included heart size is normal. Mitral annular calcifications. No pericardial effusion. HEPATOBILIARY: Status post cholecystectomy. Subcentimeter probable cyst RIGHT lobe of the liver, otherwise unremarkable. PANCREAS: Normal. SPLEEN: Normal. ADRENALS/URINARY TRACT: Kidneys are orthotopic, demonstrating symmetric enhancement. 5 mm LEFT ureteropelvic junction calculus without hydronephrosis. No hydronephrosis or solid renal masses. 3.7 cm homogeneously hypodense benign-appearing LEFT upper pole cyst. Additional too small to characterize hypodensities bilateral kidneys. The unopacified ureters are normal in course and  caliber. Delayed imaging through the kidneys demonstrates symmetric prompt contrast excretion within the proximal urinary collecting system. Urinary bladder is partially distended and unremarkable. Normal adrenal glands. STOMACH/BOWEL: Large hiatal hernia. Small duodenal diverticulum with air-fluid level. Colonic diverticulosis, likely severe the limited assessment due to LEFT hip streak artifact. Small and large bowel coarsened in out of wide necked LEFT lumbar hernia (7.4 cm neck) mom phlegm a tori changes. No bowel obstruction. VASCULAR/LYMPHATIC: Aortoiliac vessels are normal in course and caliber. Severe calcific atherosclerosis. No lymphadenopathy by CT size criteria. REPRODUCTIVE: Status post hysterectomy. OTHER: No intraperitoneal free fluid or free air. MUSCULOSKELETAL: Streak artifact from LEFT hip arthroplasty. Atrophic LEFT iliopsoas muscle, mildly edematous LEFT psoas muscle. Nondisplaced periarticular LEFT iliac vertically oriented fracture asymmetric. Atrophic LEFT gluteal muscles. Broad thoracolumbar dextroscoliosis. Grade 1 L5-S1 anterolisthesis. Severe degenerative change of the spine. IMPRESSION: 1. Nondisplaced LEFT iliac wing acute versus subacute fracture, potential insufficiency origin. Status post LEFT total hip arthroplasty. 2. Colonic diverticulosis without acute diverticulitis though, limited by hardware artifact. 3. Large LEFT lumbar hernia containing fat and bowel without bowel obstruction, or evidence of strangulation/incarceration. 4. 5 mm nonobstructing LEFT UPJ calculus. Aortic Atherosclerosis (ICD10-I70.0). Electronically Signed   By: Elon Alas M.D.   On: 01/20/2018 22:03    Procedures Procedures (including critical care time)  Medications Ordered in ED Medications  0.9 %  sodium chloride infusion ( Intravenous New Bag/Given 01/20/18 2056)  iopamidol (ISOVUE-300) 61 % injection (has no administration in time range)  pantoprazole (PROTONIX) 80 mg in sodium chloride  0.9 % 100 mL IVPB (0 mg Intravenous Stopped 01/20/18 2222)  fentaNYL (SUBLIMAZE) injection 25 mcg (25 mcg Intravenous Given 01/20/18 2057)  ondansetron (ZOFRAN) injection 4 mg (4 mg Intravenous Given 01/20/18 2056)  iopamidol (ISOVUE-300) 61 % injection 100 mL (100 mLs Intravenous Contrast Given 01/20/18 2124)     Initial Impression / Assessment and Plan / ED Course  I have reviewed the triage vital signs and the nursing notes.  Pertinent labs & imaging results that were available during my care of the patient were reviewed by me and considered in my medical decision making (see chart for details).    Abd pain likely from kidney stone.  She has never had one in the past, but she has not  been drinking much.  Fracture of L iliac wing is likely from the fall requiring hospital admission last month. She is established with Dr. Maureen Ralphs.  I watched her walk and she is ambulating well with her walker.  Stool guaiac is negative and hemoglobin is normal.  No active bleeding from GI tract.  Blood may have been from urine.  I think that can be followed by GI as an outpatient.  She is established with [facility].  Final Clinical Impressions(s) / ED Diagnoses   Final diagnoses:  GI bleed  Closed fracture of left iliac wing, initial encounter ([place])  Kidney stone    ED Discharge Orders         Ordered    ondansetron (ZOFRAN ODT) 4 MG disintegrating tablet  Every 8 hours PRN     01/20/18 2319    HYDROcodone-acetaminophen (NORCO/VICODIN) 5-325 MG tablet  Every 4 hours PRN     01/20/18 2319           Isla Pence, MD 01/20/18 2347

## 2018-01-20 NOTE — ED Notes (Signed)
Bed: UI47 Expected date:  Expected time:  Means of arrival:  Comments: EMS elderly abd pain

## 2018-01-20 NOTE — ED Notes (Signed)
Patient transported to CT 

## 2018-01-20 NOTE — ED Triage Notes (Signed)
Pt brought by [facility] due to abdominal pain since this morning. Pt also c/o diarrhea that started this morning. Pt denies fever/nausea/vomiting.

## 2018-02-05 DIAGNOSIS — K921 Melena: Secondary | ICD-10-CM | POA: Diagnosis not present

## 2018-02-05 DIAGNOSIS — R197 Diarrhea, unspecified: Secondary | ICD-10-CM | POA: Diagnosis not present

## 2018-02-09 DIAGNOSIS — R197 Diarrhea, unspecified: Secondary | ICD-10-CM | POA: Diagnosis not present

## 2018-02-11 ENCOUNTER — Ambulatory Visit: Payer: Medicare HMO | Admitting: Adult Health

## 2018-10-12 ENCOUNTER — Emergency Department (HOSPITAL_COMMUNITY): Payer: Medicare HMO

## 2018-10-12 ENCOUNTER — Other Ambulatory Visit: Payer: Self-pay

## 2018-10-12 ENCOUNTER — Emergency Department (HOSPITAL_COMMUNITY)
Admission: EM | Admit: 2018-10-12 | Discharge: 2018-10-12 | Disposition: A | Discharge status: Home / Self Care | Payer: Medicare HMO | Attending: Emergency Medicine | Admitting: Emergency Medicine

## 2018-10-12 ENCOUNTER — Encounter (HOSPITAL_COMMUNITY): Payer: Self-pay

## 2018-10-12 DIAGNOSIS — R0989 Other specified symptoms and signs involving the circulatory and respiratory systems: Secondary | ICD-10-CM | POA: Diagnosis present

## 2018-10-12 DIAGNOSIS — N183 Chronic kidney disease, stage 3 (moderate): Secondary | ICD-10-CM | POA: Diagnosis not present

## 2018-10-12 DIAGNOSIS — Z79899 Other long term (current) drug therapy: Secondary | ICD-10-CM | POA: Insufficient documentation

## 2018-10-12 DIAGNOSIS — Z7982 Long term (current) use of aspirin: Secondary | ICD-10-CM | POA: Diagnosis not present

## 2018-10-12 DIAGNOSIS — R05 Cough: Secondary | ICD-10-CM | POA: Diagnosis not present

## 2018-10-12 DIAGNOSIS — R0602 Shortness of breath: Secondary | ICD-10-CM | POA: Diagnosis not present

## 2018-10-12 DIAGNOSIS — R0600 Dyspnea, unspecified: Secondary | ICD-10-CM | POA: Diagnosis not present

## 2018-10-12 DIAGNOSIS — I129 Hypertensive chronic kidney disease with stage 1 through stage 4 chronic kidney disease, or unspecified chronic kidney disease: Secondary | ICD-10-CM | POA: Insufficient documentation

## 2018-10-12 DIAGNOSIS — R42 Dizziness and giddiness: Secondary | ICD-10-CM | POA: Diagnosis not present

## 2018-10-12 DIAGNOSIS — R09A2 Foreign body sensation, throat: Secondary | ICD-10-CM

## 2018-10-12 DIAGNOSIS — Z96659 Presence of unspecified artificial knee joint: Secondary | ICD-10-CM | POA: Insufficient documentation

## 2018-10-12 DIAGNOSIS — I10 Essential (primary) hypertension: Secondary | ICD-10-CM | POA: Diagnosis not present

## 2018-10-12 DIAGNOSIS — I4510 Unspecified right bundle-branch block: Secondary | ICD-10-CM | POA: Diagnosis not present

## 2018-10-12 DIAGNOSIS — Z853 Personal history of malignant neoplasm of breast: Secondary | ICD-10-CM | POA: Diagnosis not present

## 2018-10-12 DIAGNOSIS — R198 Other specified symptoms and signs involving the digestive system and abdomen: Secondary | ICD-10-CM

## 2018-10-12 DIAGNOSIS — E039 Hypothyroidism, unspecified: Secondary | ICD-10-CM | POA: Diagnosis not present

## 2018-10-12 DIAGNOSIS — Z96649 Presence of unspecified artificial hip joint: Secondary | ICD-10-CM | POA: Diagnosis not present

## 2018-10-12 DIAGNOSIS — R448 Other symptoms and signs involving general sensations and perceptions: Secondary | ICD-10-CM | POA: Diagnosis not present

## 2018-10-12 LAB — CBC
HCT: 32 % — ABNORMAL LOW (ref 36.0–46.0)
Hemoglobin: 10 g/dL — ABNORMAL LOW (ref 12.0–15.0)
MCH: 30.1 pg (ref 26.0–34.0)
MCHC: 31.3 g/dL (ref 30.0–36.0)
MCV: 96.4 fL (ref 80.0–100.0)
Platelets: 131 10*3/uL — ABNORMAL LOW (ref 150–400)
RBC: 3.32 MIL/uL — ABNORMAL LOW (ref 3.87–5.11)
RDW: 13.1 % (ref 11.5–15.5)
WBC: 3.7 10*3/uL — ABNORMAL LOW (ref 4.0–10.5)
nRBC: 0 % (ref 0.0–0.2)

## 2018-10-12 LAB — BASIC METABOLIC PANEL
Anion gap: 6 (ref 5–15)
BUN: 17 mg/dL (ref 8–23)
CO2: 22 mmol/L (ref 22–32)
Calcium: 7.9 mg/dL — ABNORMAL LOW (ref 8.9–10.3)
Chloride: 114 mmol/L — ABNORMAL HIGH (ref 98–111)
Creatinine, Ser: 0.99 mg/dL (ref 0.44–1.00)
GFR calc Af Amer: 59 mL/min — ABNORMAL LOW (ref >60)
GFR calc non Af Amer: 51 mL/min — ABNORMAL LOW (ref >60)
Glucose, Bld: 111 mg/dL — ABNORMAL HIGH (ref 70–99)
Potassium: 3.1 mmol/L — ABNORMAL LOW (ref 3.5–5.1)
Sodium: 142 mmol/L (ref 135–145)

## 2018-10-12 MED ORDER — PANTOPRAZOLE SODIUM 20 MG PO TBEC: 20.0000 mg | DELAYED_RELEASE_TABLET | Freq: Every day | ORAL | 0 refills | Status: DC

## 2018-10-12 MED ORDER — SODIUM CHLORIDE (PF) 0.9 % IJ SOLN
INTRAMUSCULAR | Status: AC
  Filled 2018-10-12: qty 50

## 2018-10-12 MED ORDER — GUAIFENESIN ER 600 MG PO TB12: 600.0000 mg | ORAL_TABLET | Freq: Two times a day (BID) | ORAL | 0 refills | Status: DC

## 2018-10-12 MED ORDER — IOHEXOL 300 MG/ML  SOLN
75.0000 mL | Freq: Once | INTRAMUSCULAR | Status: AC | PRN
  Administered 2018-10-12: 15:00:00 75 mL via INTRAVENOUS

## 2018-10-12 NOTE — Discharge Instructions (Signed)
1.  You had a CT scan done of your neck.  There are no masses or areas pressing on your trachea. 2.  Sometimes a feeling of mucus and difficulty breathing in the throat can be caused by acid reflux that causes inflammation and irritation around the voicebox and throat.  Start taking Protonix as prescribed to see if this helps your symptoms.  You have also been given a prescription for guaifenesin.  This medication helps loosen mucus.  Start this medication as prescribed as well. 3.  You need to make an appointment to see an ear nose throat specialist.  They can take a special lighted scope and directly examine your throat and voice box area.  You have been given the contact information for Dr. Benjamine Mola.  Call tomorrow to schedule your appointment as soon as possible.

## 2018-10-12 NOTE — ED Triage Notes (Signed)
Pt arrived via EMS from [facility]. Pt c/o SOB x 6 days. Pt reports that she has something in her throat and is not able to get it out. Pt also reports that she had some  Dizziness that started this am. Pt is able to speak in full sentences., and no apparent distress and respirations are unlabored.     EMS v/s 98% RA 171/75, RR 18,

## 2018-10-12 NOTE — ED Provider Notes (Signed)
[place] DEPT Provider Note   CSN: [id number] Arrival date & time: 10/18/2018  1232    History   Chief Complaint Chief Complaint  Patient presents with   Shortness of Breath   Dizziness    HPI Tiffany [name] is a 85 y.o. female.     HPI A 30-year-old female presents the emergency department with complaints of abnormal sensation in her throat over the past 6 days.  She feels like there is phlegm caught in the back of her throat that she cannot clear.  She states is been present over the past 4 to 5 days.  She feels like it is making her slightly short of breath and dizzy.  She can drink and swallow without difficulty.  She denies nausea or vomiting.  She continues to cough.  She denies fevers and chills.  On arrival to the emergency department she is speaking in full sentences and is in no acute apparent distress.  She presents from her assisted living facility. Past Medical History:  Diagnosis Date   Arthritis    Breast cancer ([place])    Colon polyps    Hypertension    Hypothyroidism    Osteoporosis    Rectal bleeding    Skin cancer    Stroke [facility])     Patient Active Problem List   Diagnosis Date Noted   Cerebral embolism with cerebral infarction 12/23/2017   Pressure injury of skin 12/22/2017   Fall 12/21/2017   Generalized weakness 12/21/2017   Rhabdomyolysis 12/21/2017   CKD (chronic kidney disease) stage 3, GFR 30-59 ml/min (HCC) 12/21/2017   Hypothyroidism 12/21/2017   Palpitations 12/17/2012   Internal bleeding hemorrhoids 10/26/2012   Rectal bleeding 10/04/2012   HTN (hypertension) 10/04/2012    Past Surgical History:  Procedure Laterality Date   ABDOMINAL HYSTERECTOMY     BILATERAL TOTAL MASTECTOMY WITH AXILLARY LYMPH NODE DISSECTION     CATARACT EXTRACTION     CHOLECYSTECTOMY     COLONOSCOPY N/A 10/07/2012   Procedure: COLONOSCOPY;  Surgeon: Arta Silence, MD;  Location: [facility] ENDOSCOPY;  Service:  Endoscopy;  Laterality: N/A;   hip replacment     HIP SURGERY     JOINT REPLACEMENT     MASTECTOMY     REPLACEMENT TOTAL KNEE     shin repair       OB History   No obstetric history on file.      Home Medications    Prior to Admission medications   Medication Sig Start Date End Date Taking? Authorizing Provider  acetaminophen (TYLENOL) 500 MG tablet Take 500 mg by mouth every 6 (six) hours as needed for mild pain or headache.   Yes [provider]  irbesartan (AVAPRO) 300 MG tablet Take 300 mg by mouth every morning.   Yes [provider]  levothyroxine (SYNTHROID, LEVOTHROID) 88 MCG tablet Take 88 mcg by mouth daily before breakfast.   Yes [provider]  metoprolol (LOPRESSOR) 50 MG tablet Take 50 mg by mouth 3 (three) times daily.    Yes [provider]  OVER THE COUNTER MEDICATION Place 1 spray into both nostrils daily. Nasal spray - unable to recall the name   Yes [provider]  aspirin EC 81 MG EC tablet Take 1 tablet (81 mg total) by mouth daily. Patient not taking: Reported on 01/20/2018 12/27/17   Shelly Coss, MD    Family History Family History  Problem Relation Age of Onset   Glaucoma Mother  Coronary artery disease Father    Diabetes Sister    Diabetes Brother     Social History Social History   Tobacco Use   Smoking status: Never Smoker   Smokeless tobacco: Never Used  Substance Use Topics   Alcohol use: No   Drug use: No     Allergies   Codeine; Morphine and related; and Sulfa antibiotics   Review of Systems Review of Systems  All other systems reviewed and are negative.    Physical Exam Updated Vital Signs There were no vitals taken for this visit.  Physical Exam Vitals signs and nursing note reviewed.  Constitutional:      General: She is not in acute distress.    Appearance: She is well-developed.  HENT:     Head: Normocephalic and atraumatic.     Comments:  Posterior pharynx is normal in appearance.  No swelling.  Tolerating secretions.  No stridor.  Oral airway patent. Neck:     Musculoskeletal: Normal range of motion and neck supple.     Comments: Anterior neck normal.  No significant lymphadenopathy.  Trachea midline. Cardiovascular:     Rate and Rhythm: Normal rate and regular rhythm.     Heart sounds: Normal heart sounds.  Pulmonary:     Effort: Pulmonary effort is normal.     Breath sounds: Normal breath sounds.  Abdominal:     General: There is no distension.     Palpations: Abdomen is soft.     Tenderness: There is no abdominal tenderness.  Musculoskeletal: Normal range of motion.  Skin:    General: Skin is warm and dry.  Neurological:     Mental Status: She is alert and oriented to person, place, and time.  Psychiatric:        Judgment: Judgment normal.      ED Treatments / Results  Labs (all labs ordered are listed, but only abnormal results are displayed) Labs Reviewed  CBC - Abnormal; Notable for the following components:      Result Value   WBC 3.7 (*)    RBC 3.32 (*)    Hemoglobin 10.0 (*)    HCT 32.0 (*)    Platelets 131 (*)    All other components within normal limits  BASIC METABOLIC PANEL - Abnormal; Notable for the following components:   Potassium 3.1 (*)    Chloride 114 (*)    Glucose, Bld 111 (*)    Calcium 7.9 (*)    GFR calc non Af Amer 51 (*)    GFR calc Af Amer 59 (*)    All other components within normal limits    EKG None  Radiology Dg Neck Soft Tissue  Result Date: 10/11/2018 CLINICAL DATA:  Difficulty breathing for several days EXAM: NECK SOFT TISSUES - 1+ VIEW COMPARISON:  None. FINDINGS: The airway to the level of the vocal cords is within normal limits. No prevertebral soft tissue swelling is seen. The epiglottis is within normal limits. Degenerative changes of the thoracic spine are noted. IMPRESSION: No acute abnormality noted. Electronically Signed   By: Inez Catalina M.D.   On:  10/30/2018 13:42   Dg Chest 2 View  Result Date: 10/07/2018 CLINICAL DATA:  Cough and shortness of breath. Foreign body sensation in the throat. EXAM: CHEST - 2 VIEW COMPARISON:  Chest x-ray dated January 20, 2018. FINDINGS: The heart size and mediastinal contours are within normal limits. Atherosclerotic calcification of the aortic arch. Normal pulmonary vascularity. No focal consolidation, pleural effusion,  or pneumothorax. No acute osseous abnormality. IMPRESSION: No active cardiopulmonary disease. Electronically Signed   By: Titus Dubin M.D.   On: 10/25/2018 13:42    Procedures Procedures (including critical care time)  Medications Ordered in ED Medications - No data to display   Initial Impression / Assessment and Plan / ED Course  I have reviewed the triage vital signs and the nursing notes.  Pertinent labs & imaging results that were available during my care of the patient were reviewed by me and considered in my medical decision making (see chart for details).        Plain films without abnormality.  Patient undergo CT imaging of her neck.  She can swallow liquids up without difficulty.  Her vital signs are stable.  If her CT neck is reassuring I suspect the patient can safely be discharged home on a PPI to follow-up with ENT such that if her symptoms persist she may benefit from nasopharyngoscopy.  I am not under the impression that this needs to happen in an emergent basis today given the lack of stridor and patent oral airway.  Care will be transferred to oncoming provider at 1500  Final Clinical Impressions(s) / ED Diagnoses   Final diagnoses:  None    ED Discharge Orders    None       Jola Schmidt, MD 10/20/2018 1451

## 2018-10-12 NOTE — ED Provider Notes (Signed)
Pharyngeal FB sensation. F/U CT. Start PPI. Anticipate f/u outpt ENT. Physical Exam  Pulse 76   Resp 18   SpO2 100%   Physical Exam  ED Course/Procedures     Procedures  MDM  Patient CT scan is negative.  I have reviewed the results with the patient.  She is alert and well in appearance.  There is no stridor or hoarseness to her voice.  There is no difficulty breathing.  She actually traces symptoms back to possibly being more pronounced after she had her stroke.  She finds the symptoms to be fairly recent for her.  He is not having any difficulty eating or drinking.  She is sure there is nothing stuck in her throat.  He is not choking when she eats or drinks.  At this time, patient is stable for discharge with plan as discussed with Dr. Venora Maples for initiating guaifenesin and Protonix.       Charlesetta Shanks, MD 10/10/2018 [id number]

## 2018-10-12 NOTE — ED Notes (Signed)
Bed: WA08 Expected date:  Expected time:  Means of arrival:  Comments: EMS 85yo [facility] dizziness

## 2018-11-04 DIAGNOSIS — 419620001 Death: Secondary | SNOMED CT | POA: Diagnosis not present

## 2018-11-04 DEATH — deceased

## 2019-01-01 DIAGNOSIS — E876 Hypokalemia: Secondary | ICD-10-CM | POA: Diagnosis not present

## 2019-01-01 DIAGNOSIS — R197 Diarrhea, unspecified: Secondary | ICD-10-CM | POA: Diagnosis not present

## 2019-01-01 DIAGNOSIS — Z9049 Acquired absence of other specified parts of digestive tract: Secondary | ICD-10-CM | POA: Diagnosis not present

## 2019-01-01 DIAGNOSIS — D649 Anemia, unspecified: Secondary | ICD-10-CM | POA: Diagnosis not present

## 2019-01-22 DIAGNOSIS — E876 Hypokalemia: Secondary | ICD-10-CM | POA: Diagnosis not present

## 2019-01-22 DIAGNOSIS — D649 Anemia, unspecified: Secondary | ICD-10-CM | POA: Diagnosis not present

## 2019-01-22 DIAGNOSIS — E039 Hypothyroidism, unspecified: Secondary | ICD-10-CM | POA: Diagnosis not present

## 2019-01-25 DIAGNOSIS — R197 Diarrhea, unspecified: Secondary | ICD-10-CM | POA: Diagnosis not present

## 2019-01-28 DIAGNOSIS — R197 Diarrhea, unspecified: Secondary | ICD-10-CM | POA: Diagnosis not present

## 2019-02-02 ENCOUNTER — Emergency Department (HOSPITAL_COMMUNITY)
Admission: EM | Admit: 2019-02-02 | Discharge: 2019-02-02 | Disposition: A | Discharge status: Home / Self Care | Payer: Medicare HMO | Attending: Emergency Medicine | Admitting: Emergency Medicine

## 2019-02-02 ENCOUNTER — Other Ambulatory Visit: Payer: Self-pay

## 2019-02-02 ENCOUNTER — Encounter (HOSPITAL_COMMUNITY): Payer: Self-pay

## 2019-02-02 DIAGNOSIS — Z85828 Personal history of other malignant neoplasm of skin: Secondary | ICD-10-CM | POA: Insufficient documentation

## 2019-02-02 DIAGNOSIS — Z853 Personal history of malignant neoplasm of breast: Secondary | ICD-10-CM | POA: Insufficient documentation

## 2019-02-02 DIAGNOSIS — Z96659 Presence of unspecified artificial knee joint: Secondary | ICD-10-CM | POA: Diagnosis not present

## 2019-02-02 DIAGNOSIS — Z8673 Personal history of transient ischemic attack (TIA), and cerebral infarction without residual deficits: Secondary | ICD-10-CM | POA: Insufficient documentation

## 2019-02-02 DIAGNOSIS — N183 Chronic kidney disease, stage 3 (moderate): Secondary | ICD-10-CM | POA: Diagnosis not present

## 2019-02-02 DIAGNOSIS — R319 Hematuria, unspecified: Secondary | ICD-10-CM

## 2019-02-02 DIAGNOSIS — R1084 Generalized abdominal pain: Secondary | ICD-10-CM | POA: Diagnosis not present

## 2019-02-02 DIAGNOSIS — R1030 Lower abdominal pain, unspecified: Secondary | ICD-10-CM | POA: Insufficient documentation

## 2019-02-02 DIAGNOSIS — Z96649 Presence of unspecified artificial hip joint: Secondary | ICD-10-CM | POA: Insufficient documentation

## 2019-02-02 DIAGNOSIS — N390 Urinary tract infection, site not specified: Secondary | ICD-10-CM | POA: Diagnosis not present

## 2019-02-02 DIAGNOSIS — I129 Hypertensive chronic kidney disease with stage 1 through stage 4 chronic kidney disease, or unspecified chronic kidney disease: Secondary | ICD-10-CM | POA: Insufficient documentation

## 2019-02-02 DIAGNOSIS — E039 Hypothyroidism, unspecified: Secondary | ICD-10-CM | POA: Insufficient documentation

## 2019-02-02 DIAGNOSIS — Z79899 Other long term (current) drug therapy: Secondary | ICD-10-CM | POA: Insufficient documentation

## 2019-02-02 DIAGNOSIS — I10 Essential (primary) hypertension: Secondary | ICD-10-CM | POA: Diagnosis not present

## 2019-02-02 LAB — URINALYSIS, ROUTINE W REFLEX MICROSCOPIC
Bilirubin Urine: NEGATIVE
Glucose, UA: NEGATIVE mg/dL
Ketones, ur: 5 mg/dL — AB
Nitrite: NEGATIVE
Protein, ur: 100 mg/dL — AB
RBC / HPF: >50 RBC/hpf — ABNORMAL HIGH (ref 0–5)
Specific Gravity, Urine: 1.017 (ref 1.005–1.030)
pH: 6 (ref 5.0–8.0)

## 2019-02-02 LAB — CBC WITH DIFFERENTIAL/PLATELET
Abs Immature Granulocytes: 0.01 10*3/uL (ref 0.00–0.07)
Basophils Absolute: 0 10*3/uL (ref 0.0–0.1)
Basophils Relative: 1 %
Eosinophils Absolute: 0.1 10*3/uL (ref 0.0–0.5)
Eosinophils Relative: 1 %
HCT: 38.5 % (ref 36.0–46.0)
Hemoglobin: 12 g/dL (ref 12.0–15.0)
Immature Granulocytes: 0 %
Lymphocytes Relative: 20 %
Lymphs Abs: 1 10*3/uL (ref 0.7–4.0)
MCH: 30.5 pg (ref 26.0–34.0)
MCHC: 31.2 g/dL (ref 30.0–36.0)
MCV: 98 fL (ref 80.0–100.0)
Monocytes Absolute: 0.5 10*3/uL (ref 0.1–1.0)
Monocytes Relative: 9 %
Neutro Abs: 3.6 10*3/uL (ref 1.7–7.7)
Neutrophils Relative %: 69 %
Platelets: 159 10*3/uL (ref 150–400)
RBC: 3.93 MIL/uL (ref 3.87–5.11)
RDW: 12.8 % (ref 11.5–15.5)
WBC: 5.2 10*3/uL (ref 4.0–10.5)
nRBC: 0 % (ref 0.0–0.2)

## 2019-02-02 LAB — COMPREHENSIVE METABOLIC PANEL
ALT: 12 U/L (ref 0–44)
AST: 20 U/L (ref 15–41)
Albumin: 3.6 g/dL (ref 3.5–5.0)
Alkaline Phosphatase: 71 U/L (ref 38–126)
Anion gap: 9 (ref 5–15)
BUN: 23 mg/dL (ref 8–23)
CO2: 23 mmol/L (ref 22–32)
Calcium: 9.2 mg/dL (ref 8.9–10.3)
Chloride: 110 mmol/L (ref 98–111)
Creatinine, Ser: 1 mg/dL (ref 0.44–1.00)
GFR calc Af Amer: 57 mL/min — ABNORMAL LOW (ref >60)
GFR calc non Af Amer: 50 mL/min — ABNORMAL LOW (ref >60)
Glucose, Bld: 105 mg/dL — ABNORMAL HIGH (ref 70–99)
Potassium: 4 mmol/L (ref 3.5–5.1)
Sodium: 142 mmol/L (ref 135–145)
Total Bilirubin: 0.9 mg/dL (ref 0.3–1.2)
Total Protein: 5.9 g/dL — ABNORMAL LOW (ref 6.5–8.1)

## 2019-02-02 MED ORDER — CEPHALEXIN 500 MG PO CAPS: 500.0000 mg | ORAL_CAPSULE | Freq: Four times a day (QID) | ORAL | 0 refills | Status: AC

## 2019-02-02 MED ORDER — CEPHALEXIN 500 MG PO CAPS
500.0000 mg | ORAL_CAPSULE | Freq: Once | ORAL | Status: AC
  Administered 2019-02-02: 500 mg via ORAL
  Filled 2019-02-02: qty 1

## 2019-02-02 NOTE — ED Provider Notes (Signed)
[facility] DEPT Provider Note   CSN: [id number] Arrival date & time: 02/02/19  1945     History   Chief Complaint Chief Complaint  Patient presents with  . Hematuria    HPI Tiffany [name] is a 85 y.o. female.  Presents the ER with hematuria.  Patient states that she noted small mild blood in her urine earlier today.  Had transient lower abdominal cramping earlier but denies any ongoing abdominal pain at this time.  She has not had any fevers nausea or vomiting.  No chest pain or difficulty breathing.  No sick contacts.     HPI  Past Medical History:  Diagnosis Date  . Arthritis   . Breast cancer ([place])   . Colon polyps   . Hypertension   . Hypothyroidism   . Osteoporosis   . Rectal bleeding   . Skin cancer   . Stroke [facility])     Patient Active Problem List   Diagnosis Date Noted  . Cerebral embolism with cerebral infarction 12/23/2017  . Pressure injury of skin 12/22/2017  . Fall 12/21/2017  . Generalized weakness 12/21/2017  . Rhabdomyolysis 12/21/2017  . CKD (chronic kidney disease) stage 3, GFR 30-59 ml/min (HCC) 12/21/2017  . Hypothyroidism 12/21/2017  . Palpitations 12/17/2012  . Internal bleeding hemorrhoids 10/26/2012  . Rectal bleeding 10/04/2012  . HTN (hypertension) 10/04/2012    Past Surgical History:  Procedure Laterality Date  . ABDOMINAL HYSTERECTOMY    . BILATERAL TOTAL MASTECTOMY WITH AXILLARY LYMPH NODE DISSECTION    . CATARACT EXTRACTION    . CHOLECYSTECTOMY    . COLONOSCOPY N/A 10/07/2012   Procedure: COLONOSCOPY;  Surgeon: Arta Silence, MD;  Location: [facility] ENDOSCOPY;  Service: Endoscopy;  Laterality: N/A;  . hip replacment    . HIP SURGERY    . JOINT REPLACEMENT    . MASTECTOMY    . REPLACEMENT TOTAL KNEE    . shin repair       OB History   No obstetric history on file.      Home Medications    Prior to Admission medications   Medication Sig Start Date End Date Taking? Authorizing Provider   acetaminophen (TYLENOL) 500 MG tablet Take 500 mg by mouth every 6 (six) hours as needed for mild pain or headache.   Yes [provider]  irbesartan (AVAPRO) 300 MG tablet Take 300 mg by mouth every morning.   Yes [provider]  levothyroxine (SYNTHROID, LEVOTHROID) 88 MCG tablet Take 88 mcg by mouth daily before breakfast.   Yes [provider]  metoprolol (LOPRESSOR) 50 MG tablet Take 50 mg by mouth 3 (three) times daily.    Yes [provider]  pantoprazole (PROTONIX) 20 MG tablet Take 1 tablet (20 mg total) by mouth daily. 10/27/2018  Yes Pfeiffer, Jeannie Done, MD  PREVALITE 4 g packet Take 4 g by mouth daily. 01/29/19  Yes [provider]  aspirin EC 81 MG EC tablet Take 1 tablet (81 mg total) by mouth daily. Patient not taking: Reported on 01/20/2018 12/27/17   Shelly Coss, MD    Family History Family History  Problem Relation Age of Onset  . Glaucoma Mother   . Coronary artery disease Father   . Diabetes Sister   . Diabetes Brother     Social History Social History   Tobacco Use  . Smoking status: Never Smoker  . Smokeless tobacco: Never Used  Substance Use Topics  . Alcohol use: No  . Drug use:  No     Allergies   Codeine, Morphine and related, and Sulfa antibiotics   Review of Systems Review of Systems  Constitutional: Negative for chills and fever.  HENT: Negative for ear pain and sore throat.   Eyes: Negative for pain and visual disturbance.  Respiratory: Negative for cough and shortness of breath.   Cardiovascular: Negative for chest pain and palpitations.  Gastrointestinal: Negative for abdominal pain and vomiting.  Genitourinary: Positive for hematuria. Negative for dysuria.  Musculoskeletal: Negative for arthralgias and back pain.  Skin: Negative for color change and rash.  Neurological: Negative for seizures and syncope.  All other systems reviewed and are negative.    Physical Exam Updated Vital Signs BP (!)  168/67   Pulse 80   Temp 98.4 F (36.9 C) (Oral)   Resp 16   Ht 5\' 5"  (1.651 m)   Wt 59 kg   SpO2 98%   BMI 21.63 kg/m   Physical Exam Vitals signs and nursing note reviewed.  Constitutional:      General: She is not in acute distress.    Appearance: She is well-developed.  HENT:     Head: Normocephalic and atraumatic.  Eyes:     Conjunctiva/sclera: Conjunctivae normal.  Neck:     Musculoskeletal: Neck supple.  Cardiovascular:     Rate and Rhythm: Normal rate and regular rhythm.     Heart sounds: No murmur.  Pulmonary:     Effort: Pulmonary effort is normal. No respiratory distress.     Breath sounds: Normal breath sounds.  Abdominal:     Palpations: Abdomen is soft.     Tenderness: There is no abdominal tenderness.  Skin:    General: Skin is warm and dry.     Capillary Refill: Capillary refill takes less than 2 seconds.  Neurological:     General: No focal deficit present.     Mental Status: She is alert and oriented to person, place, and time.  Psychiatric:        Mood and Affect: Mood normal.        Behavior: Behavior normal.      ED Treatments / Results  Labs (all labs ordered are listed, but only abnormal results are displayed) Labs Reviewed  COMPREHENSIVE METABOLIC PANEL - Abnormal; Notable for the following components:      Result Value   Glucose, Bld 105 (*)    Total Protein 5.9 (*)    GFR calc non Af Amer 50 (*)    GFR calc Af Amer 57 (*)    All other components within normal limits  URINALYSIS, ROUTINE W REFLEX MICROSCOPIC - Abnormal; Notable for the following components:   Color, Urine RED (*)    APPearance CLOUDY (*)    Hgb urine dipstick LARGE (*)    Ketones, ur 5 (*)    Protein, ur 100 (*)    Leukocytes,Ua TRACE (*)    RBC / HPF >50 (*)    Bacteria, UA MANY (*)    All other components within normal limits  CBC WITH DIFFERENTIAL/PLATELET    EKG None  Radiology No results found.  Procedures Procedures (including critical care  time)  Medications Ordered in ED Medications - No data to display   Initial Impression / Assessment and Plan / ED Course  I have reviewed the triage vital signs and the nursing notes.  Pertinent labs & imaging results that were available during my care of the patient were reviewed by me and considered in my  medical decision making (see chart for details).       85 year old lady presents with hematuria.  Very well-appearing on exam with normal vital signs and entirely soft abdomen.  Labs were unremarkable.  Urinalysis was concerning for urinary tract infection.  I suspect her hematuria is related to UTI.  Recommended recheck with PCP, will need to ensure hematuria resolves with treatment of urinary tract infection.  Reviewed return precautions with patient in detail will discharge home.  Started on cephalexin.    After the discussed management above, the patient was determined to be safe for discharge.  The patient was in agreement with this plan and all questions regarding their care were answered.  ED return precautions were discussed and the patient will return to the ED with any significant worsening of condition.   Final Clinical Impressions(s) / ED Diagnoses   Final diagnoses:  Urinary tract infection with hematuria, site unspecified  Hematuria, unspecified type    ED Discharge Orders    None       Lucrezia Starch, MD 02/03/19 [id number]

## 2019-02-02 NOTE — ED Triage Notes (Signed)
Pt arrived via [facility] from [facility] with complaints of blood in urine. Denies any burning, frequency, or any other urinary symptoms. Does report mild lower abdominal cramping. Blood pressure elevated upon arrival, states she has not taken her blood pressure medications today.

## 2019-02-02 NOTE — Discharge Instructions (Signed)
Please call your primary doctor to schedule recheck later this week.  If you develop abdominal pain, fever, vomiting or other new concerning symptom please return here for reassessment.

## 2019-02-02 NOTE — ED Notes (Signed)
Pt ambulated to the restroom with no difficulty  

## 2019-03-30 DIAGNOSIS — I129 Hypertensive chronic kidney disease with stage 1 through stage 4 chronic kidney disease, or unspecified chronic kidney disease: Secondary | ICD-10-CM | POA: Diagnosis not present

## 2019-03-30 DIAGNOSIS — E039 Hypothyroidism, unspecified: Secondary | ICD-10-CM | POA: Diagnosis not present

## 2019-03-30 DIAGNOSIS — M810 Age-related osteoporosis without current pathological fracture: Secondary | ICD-10-CM | POA: Diagnosis not present

## 2019-03-30 DIAGNOSIS — N183 Chronic kidney disease, stage 3 unspecified: Secondary | ICD-10-CM | POA: Diagnosis not present

## 2019-03-30 DIAGNOSIS — Z0000 Encounter for general adult medical examination without abnormal findings: Secondary | ICD-10-CM | POA: Diagnosis not present

## 2019-03-30 DIAGNOSIS — Z1389 Encounter for screening for other disorder: Secondary | ICD-10-CM | POA: Diagnosis not present

## 2019-06-16 DIAGNOSIS — L0390 Cellulitis, unspecified: Secondary | ICD-10-CM | POA: Diagnosis not present

## 2019-06-16 DIAGNOSIS — L080 Pyoderma: Secondary | ICD-10-CM | POA: Diagnosis not present

## 2019-06-16 DIAGNOSIS — L089 Local infection of the skin and subcutaneous tissue, unspecified: Secondary | ICD-10-CM | POA: Diagnosis not present

## 2019-06-16 DIAGNOSIS — L570 Actinic keratosis: Secondary | ICD-10-CM | POA: Diagnosis not present

## 2019-06-28 DIAGNOSIS — R197 Diarrhea, unspecified: Secondary | ICD-10-CM | POA: Diagnosis not present

## 2019-09-09 ENCOUNTER — Encounter: Payer: Self-pay | Admitting: Dermatology

## 2019-09-09 ENCOUNTER — Ambulatory Visit: Payer: Medicare HMO | Admitting: Dermatology

## 2019-09-09 ENCOUNTER — Other Ambulatory Visit: Payer: Self-pay

## 2019-09-09 DIAGNOSIS — L309 Dermatitis, unspecified: Secondary | ICD-10-CM | POA: Diagnosis not present

## 2019-09-09 MED ORDER — TRIAMCINOLONE ACETONIDE 0.1 % EX CREA: 1.0000 "application " | TOPICAL_CREAM | Freq: Two times a day (BID) | CUTANEOUS | 2 refills | Status: DC

## 2019-09-09 NOTE — Patient Instructions (Addendum)
Cera Ve Anti-itch is over the counter sample. Triamcinolone cream is the prescription sent to your pharmacy. Tiffany [name] had a hair permanent done by a professional a month ago and she started itching within a day or 2 and the itching and rash has never subsequently cleared.  It is pretty much limited to the area around her hair on her neck around the ear on the front of the neck and the back of the neck.  There is no history of eczema.  Examination shows patchy urticarial dermatitis.  Tiffany [name] was told I cannot absolutely distinguish a contact dermatitis (irritant or allergic) which I would have expected to spontaneously clear in less than a month, from an adult eczema which was brought out by the contact.  For the next 2 to 3 weeks she will apply prescription triamcinolone cream to the affected area twice daily, with one of the applications being after washing up.  She was also given samples of CeraVe Itch Relief (the active ingredient is pramoxine and I believe is also found in [employer]).  She may apply the nonprescription product to the affected areas as often as she would like if there is itching, with the hope being to prevent her getting into a constant scratch itch never-ending cycle.  Initial follow-up: I have asked Tiffany [name] to please call my office within 2weeks; if she is improving she can taper the use of the prescription medicine and hopefully this will not be a long-term problem.

## 2019-09-13 DIAGNOSIS — E039 Hypothyroidism, unspecified: Secondary | ICD-10-CM | POA: Diagnosis not present

## 2019-09-13 DIAGNOSIS — N1831 Chronic kidney disease, stage 3a: Secondary | ICD-10-CM | POA: Diagnosis not present

## 2019-09-13 DIAGNOSIS — I700 Atherosclerosis of aorta: Secondary | ICD-10-CM | POA: Diagnosis not present

## 2019-09-13 DIAGNOSIS — I129 Hypertensive chronic kidney disease with stage 1 through stage 4 chronic kidney disease, or unspecified chronic kidney disease: Secondary | ICD-10-CM | POA: Diagnosis not present

## 2019-09-20 NOTE — Progress Notes (Signed)
   Follow-Up Visit   Subjective  Tiffany [name] is a 86 y.o. female who presents for the following: Skin Problem (rash x 1 week-around neck, chest and back - + itch -tx none).  Rash Location: Neck Duration: 1 month Quality: Persistent Associated Signs/Symptoms: Itch Modifying Factors: Onset after getting a professional hair permanent Severity:  Timing: Context:   The following portions of the chart were reviewed this encounter and updated as appropriate:     Objective  Well appearing patient in no apparent distress; mood and affect are within normal limits.  A focused examination was performed including head and neck examined. Relevant physical exam findings are noted in the Assessment and Plan.   Assessment & Plan  Dermatitis (4) Head - Anterior (Face); Neck - Anterior; Neck - Posterior (2)  Triamcinolone daily for 2 to 3 weeks. If there is no improvement, consider scheduling patch test  Ordered Medications: triamcinolone cream (KENALOG) 0.1 % Tiffany [name] had a hair permanent done by a professional a month ago and she started itching within a day or 2 and the itching and rash has never subsequently cleared.  It is pretty much limited to the area around her hair on her neck around the ear on the front of the neck and the back of the neck.  There is no history of eczema.  Examination shows patchy urticarial dermatitis.  Tiffany [name] was told I cannot absolutely distinguish a contact dermatitis (irritant or allergic) which I would have expected to spontaneously clear in less than a month, from an adult eczema which was brought out by the contact.  For the next 2 to 3 weeks she will apply prescription triamcinolone cream to the affected area twice daily, with one of the applications being after washing up.  She was also given samples of CeraVe Itch Relief (the active ingredient is pramoxine and I believe is also found in [employer]).  She may apply the nonprescription product to  the affected areas as often as she would like if there is itching, with the hope being to prevent her getting into a constant scratch itch never-ending cycle.  Initial follow-up: I have asked Tiffany [name] to please call my office within 2weeks; if she is improving she can taper the use of the prescription medicine and hopefully this will not be a long-term problem.

## 2019-09-25 ENCOUNTER — Emergency Department (HOSPITAL_COMMUNITY)
Admission: EM | Admit: 2019-09-25 | Discharge: 2019-09-30 | Disposition: A | Discharge status: Skilled Nursing Facility | Payer: Medicare HMO | Attending: Emergency Medicine | Admitting: Emergency Medicine

## 2019-09-25 ENCOUNTER — Other Ambulatory Visit: Payer: Self-pay

## 2019-09-25 ENCOUNTER — Encounter (HOSPITAL_COMMUNITY): Payer: Self-pay | Admitting: Emergency Medicine

## 2019-09-25 ENCOUNTER — Emergency Department (HOSPITAL_COMMUNITY): Payer: Medicare HMO

## 2019-09-25 DIAGNOSIS — I10 Essential (primary) hypertension: Secondary | ICD-10-CM | POA: Insufficient documentation

## 2019-09-25 DIAGNOSIS — E039 Hypothyroidism, unspecified: Secondary | ICD-10-CM | POA: Diagnosis not present

## 2019-09-25 DIAGNOSIS — Z8673 Personal history of transient ischemic attack (TIA), and cerebral infarction without residual deficits: Secondary | ICD-10-CM | POA: Insufficient documentation

## 2019-09-25 DIAGNOSIS — S0990XA Unspecified injury of head, initial encounter: Secondary | ICD-10-CM | POA: Diagnosis not present

## 2019-09-25 DIAGNOSIS — W19XXXA Unspecified fall, initial encounter: Secondary | ICD-10-CM

## 2019-09-25 DIAGNOSIS — Y999 Unspecified external cause status: Secondary | ICD-10-CM | POA: Diagnosis not present

## 2019-09-25 DIAGNOSIS — Z79899 Other long term (current) drug therapy: Secondary | ICD-10-CM | POA: Insufficient documentation

## 2019-09-25 DIAGNOSIS — R262 Difficulty in walking, not elsewhere classified: Secondary | ICD-10-CM | POA: Diagnosis not present

## 2019-09-25 DIAGNOSIS — F0390 Unspecified dementia without behavioral disturbance: Secondary | ICD-10-CM | POA: Diagnosis present

## 2019-09-25 DIAGNOSIS — M6281 Muscle weakness (generalized): Secondary | ICD-10-CM | POA: Insufficient documentation

## 2019-09-25 DIAGNOSIS — Y9389 Activity, other specified: Secondary | ICD-10-CM | POA: Diagnosis not present

## 2019-09-25 DIAGNOSIS — R2681 Unsteadiness on feet: Secondary | ICD-10-CM | POA: Insufficient documentation

## 2019-09-25 DIAGNOSIS — W010XXA Fall on same level from slipping, tripping and stumbling without subsequent striking against object, initial encounter: Secondary | ICD-10-CM | POA: Insufficient documentation

## 2019-09-25 DIAGNOSIS — Y92099 Unspecified place in other non-institutional residence as the place of occurrence of the external cause: Secondary | ICD-10-CM | POA: Diagnosis not present

## 2019-09-25 DIAGNOSIS — Z20822 Contact with and (suspected) exposure to covid-19: Secondary | ICD-10-CM | POA: Insufficient documentation

## 2019-09-25 DIAGNOSIS — R0989 Other specified symptoms and signs involving the circulatory and respiratory systems: Secondary | ICD-10-CM | POA: Diagnosis not present

## 2019-09-25 DIAGNOSIS — S199XXA Unspecified injury of neck, initial encounter: Secondary | ICD-10-CM | POA: Diagnosis not present

## 2019-09-25 DIAGNOSIS — S79911A Unspecified injury of right hip, initial encounter: Secondary | ICD-10-CM | POA: Diagnosis not present

## 2019-09-25 DIAGNOSIS — Z7982 Long term (current) use of aspirin: Secondary | ICD-10-CM | POA: Insufficient documentation

## 2019-09-25 DIAGNOSIS — R296 Repeated falls: Secondary | ICD-10-CM | POA: Insufficient documentation

## 2019-09-25 DIAGNOSIS — R69 Illness, unspecified: Secondary | ICD-10-CM | POA: Diagnosis not present

## 2019-09-25 NOTE — ED Triage Notes (Signed)
Per PTAR, patient from home, reports fall x2 hours ago, found by son. Denies pain. Patient lives alone. Reports trip and fall over couch. Hx dementia.   Son, [name], reports he will be available to let patient back in her home at time of d/c.

## 2019-09-25 NOTE — ED Provider Notes (Signed)
TIME SEEN: 11:32 PM  CHIEF COMPLAINT: Fall  HPI: Patient is a 86 year old female with history of dementia, hypertension, hypothyroidism, stroke who presents to the emergency department with EMS after she had a mechanical fall at home.  She tells me that her foot got caught on the head of the couch causing her to trip and fall.  She normally ambulates with a cane and was using her cane when she fell.  She is adamant that she did not hit her head.  She denies any pain.     Echo August 2019:  Study Conclusions   - Left ventricle: The cavity size was normal. There was mild focal  basal hypertrophy of the septum. Systolic function was normal.  The estimated ejection fraction was in the range of 60% to 65%.  Doppler parameters are consistent with elevated ventricular  end-diastolic filling pressure.  - Aortic valve: Sclerosis without stenosis.  - Mitral valve: Moderately calcified annulus. Moderately thickened  leaflets . There was mild regurgitation.  - Left atrium: The atrium was mildly dilated.  - Atrial septum: No defect or patent foramen ovale was identified.   PCP - Dr. Pershing Cox  ROS: Level 5 caveat secondary to dementia  PAST MEDICAL HISTORY/PAST SURGICAL HISTORY:  Past Medical History:  Diagnosis Date  . Arthritis   . Atypical mole 09/23/2002   Right lower cheek (slight) ([facility])  . Atypical mole 09/23/2002   Left Lower Cheek (slight to moderate) ([facility] and excision)  . Atypical mole 12/19/2008   Left Cheek (atypical nevi) (excision)  . BCC (basal cell carcinoma of skin) 03/18/1995   Right Abdomen  . BCC (basal cell carcinoma of skin) 06/12/1999   Upper Right Chest (curet and 5FU)  . Breast cancer ([place])   . Colon polyps   . Hypertension   . Hypothyroidism   . Osteoporosis   . Rectal bleeding   . SCCA (squamous cell carcinoma) of skin 03/06/2004   Left Shin (curet and 5FU)  . SCCA (squamous cell carcinoma) of skin 10/07/2006   Left Hand (in  situ) (curet and 5FU)  . Skin cancer   . Stroke ([place])   . Superficial basal cell carcinoma (BCC) 05/04/2007   Left Shin (curet and 5FU)    MEDICATIONS:  Prior to Admission medications   Medication Sig Start Date End Date Taking? Authorizing Provider  acetaminophen (TYLENOL) 500 MG tablet Take 500 mg by mouth every 6 (six) hours as needed for mild pain or headache.    [provider]  aspirin EC 81 MG EC tablet Take 1 tablet (81 mg total) by mouth daily. 12/27/17   Shelly Coss, MD  irbesartan (AVAPRO) 300 MG tablet Take 300 mg by mouth every morning.    [provider]  levothyroxine (SYNTHROID, LEVOTHROID) 88 MCG tablet Take 88 mcg by mouth daily before breakfast.    [provider]  metoprolol (LOPRESSOR) 50 MG tablet Take 50 mg by mouth 3 (three) times daily.     [provider]  pantoprazole (PROTONIX) 20 MG tablet Take 1 tablet (20 mg total) by mouth daily. 10/11/2018   Charlesetta Shanks, MD  PREVALITE 4 g packet Take 4 g by mouth daily. 01/29/19   [provider]  triamcinolone cream (KENALOG) 0.1 % Apply 1 application topically 2 (two) times daily. 09/09/19   Lavonna Monarch, MD    ALLERGIES:  Allergies  Allergen Reactions  . Codeine Nausea Only  . Morphine And Related Anxiety and Other (See Comments)  Feel very unwell and out there  . Sulfa Antibiotics Hives and Itching    SOCIAL HISTORY:  Social History   Tobacco Use  . Smoking status: Never Smoker  . Smokeless tobacco: Never Used  Substance Use Topics  . Alcohol use: No    FAMILY HISTORY: Family History  Problem Relation Age of Onset  . Glaucoma Mother   . Coronary artery disease Father   . Diabetes Sister   . Diabetes Brother     EXAM: BP (!) 159/76 (BP Location: Right Arm)   Pulse 76   Temp 98.4 F (36.9 C) (Oral)   Resp 16   SpO2 98%  CONSTITUTIONAL: Alert and oriented person and place but states it is 1928 and seems to answer questions appropriately and does  not appear significantly confused. Well-appearing; well-nourished; elderly HEAD: Normocephalic; atraumatic EYES: Conjunctivae clear, PERRL, EOMI ENT: normal nose; no rhinorrhea; moist mucous membranes; pharynx without lesions noted; no dental injury; no septal hematoma NECK: Supple, no meningismus, no LAD; no midline spinal tenderness, step-off or deformity; trachea midline CARD: RRR; S1 and S2 appreciated; no murmurs, no clicks, no rubs, no gallops RESP: Normal chest excursion without splinting or tachypnea; breath sounds clear and equal bilaterally; no wheezes, no rhonchi, no rales; no hypoxia or respiratory distress CHEST:  chest wall stable, no crepitus or ecchymosis or deformity, nontender to palpation; no flail chest ABD/GI: Normal bowel sounds; non-distended; soft, non-tender, no rebound, no guarding; no ecchymosis or other lesions noted PELVIS:  stable, nontender to palpation BACK:  The back appears normal and is non-tender to palpation, there is no CVA tenderness; no midline spinal tenderness, step-off or deformity EXT: Normal ROM in all joints; non-tender to palpation; no edema; normal capillary refill; no cyanosis, no bony tenderness or bony deformity of patient's extremities, no joint effusion, compartments are soft, extremities are warm and well-perfused, no ecchymosis SKIN: Normal color for age and race; warm NEURO: Moves all extremities equally, no facial asymmetry, CN 2 -12 intact, normal speech, reports normal sensation diffusely PSYCH: The patient's mood and manner are appropriate. Grooming and personal hygiene are appropriate.  MEDICAL DECISION MAKING: Patient here with mechanical fall.  No sign of trauma on exam.  She is adamant that she did not hit her head.  States she does not think she is on blood thinners.  It appears she may be on aspirin 81 mg daily.  Have called her son at [phone] without answer and left HIPAA compliant voicemail.  Would like to speak to him regarding  what happened prior to disposition.  ED PROGRESS: 11:38 PM  Spoke with son, [name].  Patient on floor and unable to get up.  Lying on R hip and has had previous surgery on this hip multiple times.  She was acting confused - he describes "goofy".  Unclear how long she had been down.  Patient told the son that she had been down "since Zoey left".  He states that [employer] work on Saturdays (she lives at [facility] and Fredric Mare is the Radio broadcast assistant there).  She has had syncopal events before.  He is worried that this is what could have caused her to fall today.  She states that she tripped on the him of the couch.  Son said she was lying in her "waste" suggestive that she had been down for some time.  Son also reports that the patient was having a conversation with someone.  He states that when he asked her who she  was talking to she told him "my man" (this is how she refers to grandson Simon) on the couch and there was no one on the couch.  He states that this behavior is new for her.  She hasn't taken her medications in over one week.  Unclear last time seen normal.  Son last saw her Monday.  States that she does not have home health aide.  She is occasionally checked on by his ex-wife and the managers at the apartment complex.  He is worried about her progressive decline over the past several weeks.  2:30 AM  Pt's labs, imaging reviewed/interpreted.  Labs unremarkable other than mildly elevated CK level of 251.  Patient receiving IV fluids. Initial high-sensitivity troponin of 76.  EKG shows no ischemic change and she is denying any chest pain.  Second troponin pending.  Head CT shows no acute abnormality nor does cervical spine CT.  X-ray is also unremarkable for acute injury.  Urinalysis pending.  4:30 AM  Pt's second troponin is 81.  She still denies any chest pain or chest discomfort.  Her chest x-ray shows cardiomegaly and vascular congestion but no edema.  Does not appear she is  on diuretics.  He does not appear volume overloaded on exam.  Low suspicion that this is cardiac in nature but will obtain third troponin to ensure that it is staying flat.  At this time I do not have any medical reason for her worsening decline over the past several weeks.  I suspect that it is due to her underlying dementia.  I suspect that she would benefit from increased home health care versus placement into an assisted living facility.  I have had lengthy conversation again with patient's son.  Patient's son is now at home.  He initially states that he would like her to be taken [facility]  I am concerned that there will be no one home they are to help her get into the house and get settled.  He states he cannot come out until later in the day.  I am concerned that she does not have more help and seems to be progressively declining likely from her dementia.  He tells me that she has been wearing the same [place] for days.  It appears to be covered in food and soiled.  We are both concerned that she is not bathing regularly.  He states he thinks that she is eating regularly as he states he is having to buy her groceries but states that she makes her own meals and most of them are prepackaged.  It honestly sounds like she has not checked on regularly enough for someone to know if she is able to perform her ADLs safely.  He was able to tell me that it did not look like she had had her medication in over a week.  I feel at this point she needs case management and social work evaluation prior to being discharged to make sure that this is a safe environment for her to go back to and see if we can help with potential home health or even placement into an assisted living facility.  Son is comfortable with this plan.  Case management and social work consults have been placed.  I have ordered her home medications.   5:45 AM  Pt's third troponin is down to 62.  I doubt that this is an acute  cardiac event.  7:30 AM  Pt's free T4 pending.  Signed out to oncoming ED physician to follow-up on this lab test.  Social work/case management evaluation pending.  I reviewed all nursing notes and pertinent previous records as available.  I have reviewed and interpreted any EKGs, lab and urine results, imaging (as available).   EKG Interpretation  Date/Time:  Sunday Sep 26 2019 00:31:47 EDT Ventricular Rate:  74 PR Interval:    QRS Duration: 149 QT Interval:  422 QTC Calculation: 469 R Axis:   74 Text Interpretation: Sinus rhythm Right bundle branch block No significant change since last tracing Confirmed by Pryor Curia [phone]) on 09/26/2019 12:50:59 AM          Joanne Gavel was evaluated in Emergency Department on 09/25/2019 for the symptoms described in the history of present illness. She was evaluated in the context of the global COVID-19 pandemic, which necessitated consideration that the patient might be at risk for infection with the SARS-CoV-2 virus that causes COVID-19. Institutional protocols and algorithms that pertain to the evaluation of patients at risk for COVID-19 are in a state of rapid change based on information released by regulatory bodies including the CDC and federal and state organizations. These policies and algorithms were followed during the patient's care in the ED.       Ward, Delice Bison, DO 09/26/19 [phone]

## 2019-09-26 ENCOUNTER — Emergency Department (HOSPITAL_COMMUNITY): Payer: Medicare HMO

## 2019-09-26 DIAGNOSIS — R0989 Other specified symptoms and signs involving the circulatory and respiratory systems: Secondary | ICD-10-CM | POA: Diagnosis not present

## 2019-09-26 DIAGNOSIS — S79911A Unspecified injury of right hip, initial encounter: Secondary | ICD-10-CM | POA: Diagnosis not present

## 2019-09-26 DIAGNOSIS — S0990XA Unspecified injury of head, initial encounter: Secondary | ICD-10-CM | POA: Diagnosis not present

## 2019-09-26 DIAGNOSIS — S199XXA Unspecified injury of neck, initial encounter: Secondary | ICD-10-CM | POA: Diagnosis not present

## 2019-09-26 LAB — COMPREHENSIVE METABOLIC PANEL
ALT: 11 U/L (ref 0–44)
AST: 23 U/L (ref 15–41)
Albumin: 3.8 g/dL (ref 3.5–5.0)
Alkaline Phosphatase: 87 U/L (ref 38–126)
Anion gap: 7 (ref 5–15)
BUN: 25 mg/dL — ABNORMAL HIGH (ref 8–23)
CO2: 26 mmol/L (ref 22–32)
Calcium: 9.2 mg/dL (ref 8.9–10.3)
Chloride: 102 mmol/L (ref 98–111)
Creatinine, Ser: 0.98 mg/dL (ref 0.44–1.00)
GFR calc Af Amer: 59 mL/min — ABNORMAL LOW (ref >60)
GFR calc non Af Amer: 51 mL/min — ABNORMAL LOW (ref >60)
Glucose, Bld: 119 mg/dL — ABNORMAL HIGH (ref 70–99)
Potassium: 3.8 mmol/L (ref 3.5–5.1)
Sodium: 135 mmol/L (ref 135–145)
Total Bilirubin: 1.3 mg/dL — ABNORMAL HIGH (ref 0.3–1.2)
Total Protein: 6.4 g/dL — ABNORMAL LOW (ref 6.5–8.1)

## 2019-09-26 LAB — CBC WITH DIFFERENTIAL/PLATELET
Abs Immature Granulocytes: 0.02 10*3/uL (ref 0.00–0.07)
Basophils Absolute: 0 10*3/uL (ref 0.0–0.1)
Basophils Relative: 0 %
Eosinophils Absolute: 0 10*3/uL (ref 0.0–0.5)
Eosinophils Relative: 0 %
HCT: 42.3 % (ref 36.0–46.0)
Hemoglobin: 13.8 g/dL (ref 12.0–15.0)
Immature Granulocytes: 0 %
Lymphocytes Relative: 11 %
Lymphs Abs: 1 10*3/uL (ref 0.7–4.0)
MCH: 30.7 pg (ref 26.0–34.0)
MCHC: 32.6 g/dL (ref 30.0–36.0)
MCV: 94.2 fL (ref 80.0–100.0)
Monocytes Absolute: 0.8 10*3/uL (ref 0.1–1.0)
Monocytes Relative: 9 %
Neutro Abs: 6.7 10*3/uL (ref 1.7–7.7)
Neutrophils Relative %: 80 %
Platelets: 164 10*3/uL (ref 150–400)
RBC: 4.49 MIL/uL (ref 3.87–5.11)
RDW: 12.5 % (ref 11.5–15.5)
WBC: 8.5 10*3/uL (ref 4.0–10.5)
nRBC: 0 % (ref 0.0–0.2)

## 2019-09-26 LAB — TROPONIN I (HIGH SENSITIVITY)
Troponin I (High Sensitivity): 62 ng/L — ABNORMAL HIGH (ref <18)
Troponin I (High Sensitivity): 76 ng/L — ABNORMAL HIGH (ref <18)
Troponin I (High Sensitivity): 81 ng/L — ABNORMAL HIGH (ref <18)

## 2019-09-26 LAB — TSH: TSH: 6.906 u[IU]/mL — ABNORMAL HIGH (ref 0.350–4.500)

## 2019-09-26 LAB — SARS CORONAVIRUS 2 BY RT PCR (HOSPITAL ORDER, PERFORMED IN ~~LOC~~ HOSPITAL LAB): SARS Coronavirus 2: NEGATIVE

## 2019-09-26 LAB — URINALYSIS, ROUTINE W REFLEX MICROSCOPIC
Bilirubin Urine: NEGATIVE
Glucose, UA: NEGATIVE mg/dL
Hgb urine dipstick: NEGATIVE
Ketones, ur: 5 mg/dL — AB
Leukocytes,Ua: NEGATIVE
Nitrite: NEGATIVE
Protein, ur: NEGATIVE mg/dL
Specific Gravity, Urine: 1.013 (ref 1.005–1.030)
pH: 7 (ref 5.0–8.0)

## 2019-09-26 LAB — CK: Total CK: 251 U/L — ABNORMAL HIGH (ref 38–234)

## 2019-09-26 LAB — T4, FREE: Free T4: 0.69 ng/dL (ref 0.61–1.12)

## 2019-09-26 MED ORDER — IRBESARTAN 300 MG PO TABS
300.0000 mg | ORAL_TABLET | Freq: Every morning | ORAL | Status: DC
  Administered 2019-09-26 – 2019-09-30 (×5): 300 mg via ORAL
  Filled 2019-09-26 (×5): qty 1

## 2019-09-26 MED ORDER — ACETAMINOPHEN 500 MG PO TABS: 500.0000 mg | ORAL_TABLET | Freq: Four times a day (QID) | ORAL | Status: DC | PRN

## 2019-09-26 MED ORDER — LEVOTHYROXINE SODIUM 88 MCG PO TABS
88.0000 ug | ORAL_TABLET | Freq: Every day | ORAL | Status: DC
  Administered 2019-09-26 – 2019-09-30 (×5): 88 ug via ORAL
  Filled 2019-09-26 (×5): qty 1

## 2019-09-26 MED ORDER — SODIUM CHLORIDE 0.9 % IV BOLUS (SEPSIS)
500.0000 mL | Freq: Once | INTRAVENOUS | Status: AC
  Administered 2019-09-26: 500 mL via INTRAVENOUS

## 2019-09-26 MED ORDER — METOPROLOL TARTRATE 25 MG PO TABS
50.0000 mg | ORAL_TABLET | Freq: Three times a day (TID) | ORAL | Status: DC
  Administered 2019-09-26 – 2019-09-30 (×10): 50 mg via ORAL
  Filled 2019-09-26 (×11): qty 2

## 2019-09-26 MED ORDER — LORAZEPAM 1 MG PO TABS
1.0000 mg | ORAL_TABLET | Freq: Once | ORAL | Status: AC
  Administered 2019-09-26: 1 mg via ORAL
  Filled 2019-09-26: qty 1

## 2019-09-26 MED ORDER — TRIAMCINOLONE ACETONIDE 0.1 % EX CREA
1.0000 "application " | TOPICAL_CREAM | Freq: Two times a day (BID) | CUTANEOUS | Status: DC | PRN
  Filled 2019-09-26: qty 15

## 2019-09-26 MED ORDER — CHOLESTYRAMINE LIGHT 4 G PO PACK
4.0000 g | PACK | Freq: Every day | ORAL | Status: DC
  Administered 2019-09-26 – 2019-09-29 (×4): 4 g via ORAL
  Filled 2019-09-26 (×5): qty 1

## 2019-09-26 MED ORDER — ASPIRIN EC 81 MG PO TBEC
81.0000 mg | DELAYED_RELEASE_TABLET | Freq: Every day | ORAL | Status: DC
  Administered 2019-09-26 – 2019-09-30 (×5): 81 mg via ORAL
  Filled 2019-09-26 (×5): qty 1

## 2019-09-26 MED ORDER — PANTOPRAZOLE SODIUM 20 MG PO TBEC
20.0000 mg | DELAYED_RELEASE_TABLET | Freq: Every day | ORAL | Status: DC
  Administered 2019-09-26 – 2019-09-30 (×5): 20 mg via ORAL
  Filled 2019-09-26 (×5): qty 1

## 2019-09-26 NOTE — ED Notes (Signed)
Pt transported to xray 

## 2019-09-26 NOTE — Progress Notes (Signed)
Per pt's chart, pt has a PASRR:  [id number]   Start Date: 10/04/2004 No end date.  CSW will continue to follow for D/C needs.  Alphonse Guild. Psalm Arman  MSW, LCSW, LCAS, CCS Transitions of Care Clinical Social Worker Care Coordination Department Ph: [phone]

## 2019-09-26 NOTE — Progress Notes (Signed)
Per family SNF is preferred and son [name] is Hospital doctor. CSW notes son reports he will bring in paperwork for documentation. CSW notes preference is [facility] or [facility] and at this time consent is given to search in [place]. CSW began FL-2, referral pending documentation of PT eval.

## 2019-09-26 NOTE — Progress Notes (Signed)
CSW updated by [facility] RN CM covering [facility] ED that pt is to be seen by PT.  CSW will continue to follow for D/C needs.  Tiffany [name]. Katherina Wimer  MSW, LCSW, LCAS, CCS Transitions of Care Clinical Social Worker Care Coordination Department Ph: [phone]

## 2019-09-26 NOTE — Progress Notes (Signed)
CSW spoke to pt's son who stated that pt has been at [facility] for over two years and in the past week has decompensated dramatically.  Last year pt suffered a stroke and had lain over the floor for 4 days and 5 nights without food and water without being able to ask for help.  Pt has since done better until the past week, per the pt's son..  Per pt's son, pt needs help   Pt's son is agreeable to SNF placement.  CSW cautioned pt's son against false hope pt will be placed in a timely manner and that if the pt's SNF stay is authorized then due to time constraints the pt's choices of facilities may be limited and if only one choice presents itself the pt will have to go to that facility.  Pt's son voiced understanding and is agreeable to SNF placement but voices understanding that this is not a guarantee for placement  and states pt has been to [facility] in the past and could go there again as their [place] was a success.  CSW spoke with pt's son (pt has a HX of dementia) and confirmed pt's plan to be discharged to SNF to for rehab at discharge.  CSW provided active listening and validated pt's son's concerns.   CSW was given permission to complete FL-2 and send referrals out to SNF facilities via the hub per pt's request.  Pt has been living independently prior to being admitted to [facility].  Pt's son has a plan of providing part-time PCA care (a couple of week) post discharge from a SNF stay.  CSW will continue to follow for D/C needs.  Alphonse Guild. Miral Hoopes  MSW, LCSW, LCAS, CCS Transitions of Care Clinical Social Worker Care Coordination Department Ph: [phone]

## 2019-09-26 NOTE — TOC Initial Note (Signed)
Transition of Care [facility]) - Initial/Assessment Note    Patient Details  Name: Tiffany [name] MRN: [id number] Date of Birth: 24-May-1926  Transition of Care [facility]) CM/SW Contact:    Verdell Carmine, RN Phone Number: 09/26/2019, 9:33 AM  Clinical Narrative:                 Patient came in after a fall unknown downtime. Question of self care, has had the same shirt on, has missed medications for the last week. Son states to Doctor this has been a change in the last week. Lives in [place] living with check by Son, but seems not enough if patient disheveled, not taking medicines, needs more supervision. PT consulted to assess and make recommendations regarding home health versus SNF Patient does have some confusion. Discussed with Dr. Melina Copa   Expected Discharge Plan: [place] Barriers to Discharge: Continued Medical Work up(PT consult)   Patient Goals and CMS Choice        Expected Discharge Plan and Services Expected Discharge Plan: [place] arrangements for the past 2 months: Apartment                                      Prior Living Arrangements/Services Living arrangements for the past 2 months: Apartment Lives with:: Self(independent living family checks on patient) Patient language and need for interpreter reviewed:: Yes        Need for Family Participation in Patient Care: Yes (Comment) Care giver support system in place?: Yes (comment)   Criminal Activity/Legal Involvement Pertinent to Current Situation/Hospitalization: No - Comment as needed  Activities of Daily Living      Permission Sought/Granted Permission sought to share information with : Case Manager Permission granted to share information with : Yes, Verbal Permission Granted              Emotional Assessment Appearance:: Disheveled     Orientation: : Oriented to Self Alcohol / Substance Use: Tobacco Use Psych Involvement: No  (comment)  Admission diagnosis:  Fall, AMS, dementia Patient Active Problem List   Diagnosis Date Noted  . Cerebral embolism with cerebral infarction 12/23/2017  . Pressure injury of skin 12/22/2017  . Fall 12/21/2017  . Generalized weakness 12/21/2017  . Rhabdomyolysis 12/21/2017  . CKD (chronic kidney disease) stage 3, GFR 30-59 ml/min 12/21/2017  . Hypothyroidism 12/21/2017  . Palpitations 12/17/2012  . Internal bleeding hemorrhoids 10/26/2012  . Rectal bleeding 10/04/2012  . HTN (hypertension) 10/04/2012   PCP:  Donald Prose, MD Pharmacy:   [facility] [address] [facility] [address] [place] [address] Phone: [phone] Fax: [phone]     Social Determinants of Health (SDOH) Interventions    Readmission Risk Interventions No flowsheet data found.

## 2019-09-26 NOTE — Progress Notes (Signed)
CSW updated by pt's RN pt has been seen by PT.   CSW awaiting result of PT recommendation.  CSW will continue to follow for D/C needs.  Alphonse Guild. Chryl Holten  MSW, LCSW, LCAS, CCS Transitions of Care Clinical Social Worker Care Coordination Department Ph: [phone]

## 2019-09-26 NOTE — ED Notes (Signed)
Upon going to check on patient, patient out of bed. Patient pulled off equipment and pulled out her IV. IV fluids noted to be on the floor. Unsure how much of the fluids patient received.

## 2019-09-26 NOTE — ED Provider Notes (Signed)
Signout from Dr. Leonides Schanz.  Elderly female with some dementia who lives independently here after a fall.  She is pending following up on a free T4 as her TSH was elevated.  She is also pending TOC consult as her living situation is not safe. Physical Exam  BP (!) 160/88   Pulse 82   Temp 98.4 F (36.9 C) (Oral)   Resp 20   SpO2 95%   Physical Exam  ED Course/Procedures     Procedures  MDM  Patient's free T4 is in the normal range.  Social work has contacted me and is putting in for physical therapy consult.  The patient's son reached out to me and I updated him with current plan.  Physical therapy is recommending SNF.  The fact that it is the weekend she is unlikely to be placed today.  I signed this out to Dr. Darl Householder.       Hayden Rasmussen, MD 09/26/19 Tresa Moore

## 2019-09-26 NOTE — Progress Notes (Signed)
CSW called pt's son [name] at ph: [phone] and left a HIPPA-compliant VM requesting a call back.  CSW will continue to follow for D/C needs.  Alphonse Guild. Rylin Saez  MSW, LCSW, LCAS, CCS Transitions of Care Clinical Social Worker Care Coordination Department Ph: [phone]

## 2019-09-26 NOTE — Evaluation (Signed)
Physical Therapy Evaluation Patient Details Name: Tiffany [name] MRN: [id number] DOB: [birth year] Today's Date: 09/26/2019   History of Present Illness  Pt admitted from IND living s/p falls and with hx of CVA, Breast CA s/p mastectomy, L TKR, THR, osteoporosis and dementia  Clinical Impression  Pt admitted as above and presenting with functional mobility limitations 2* generalized weakness, balance deficits with pt admitting to frequent falls at home, and dementia related cognitive deficits.  Pt would benefit from increased level of assist/supervision such as follow up rehab at SNF level to maximize IND and safety prior to possible ALF placement.    Follow Up Recommendations SNF    Equipment Recommendations  None recommended by PT    Recommendations for Other Services OT consult     Precautions / Restrictions Precautions Precautions: Fall Restrictions Weight Bearing Restrictions: No      Mobility  Bed Mobility Overal bed mobility: Needs Assistance Bed Mobility: Supine to Sit;Sit to Supine     Supine to sit: Min assist Sit to supine: Min assist   General bed mobility comments: min assist to manage LEs and to bring trunk to upright  Transfers Overall transfer level: Needs assistance Equipment used: Rolling walker (2 wheeled) Transfers: Sit to/from Stand Sit to Stand: Min assist;From elevated surface         General transfer comment: min assist to bring wt fwd and to balance in intial standing  Ambulation/Gait             General Gait Details: Pt stood only but unwilling to attempt ambulation  Stairs            Wheelchair Mobility    Modified Rankin (Stroke Patients Only)       Balance Overall balance assessment: Needs assistance Sitting-balance support: Feet unsupported;No upper extremity supported Sitting balance-Leahy Scale: Fair     Standing balance support: Bilateral upper extremity supported Standing balance-Leahy Scale: Poor                                Pertinent Vitals/Pain Pain Assessment: No/denies pain    Home Living Family/patient expects to be discharged to:: Unsure                      Prior Function Level of Independence: Independent with assistive device(s)         Comments: pt reports using cane or RW but also admits to "falling all the time"     Hand Dominance        Extremity/Trunk Assessment   Upper Extremity Assessment Upper Extremity Assessment: Generalized weakness    Lower Extremity Assessment Lower Extremity Assessment: Generalized weakness    Cervical / Trunk Assessment Cervical / Trunk Assessment: Kyphotic  Communication   Communication: No difficulties  Cognition Arousal/Alertness: Awake/alert Behavior During Therapy: WFL for tasks assessed/performed Overall Cognitive Status: History of cognitive impairments - at baseline                                 General Comments: Pt unsure of place and situation but "I think this looks like a hospital"      General Comments      Exercises     Assessment/Plan    PT Assessment Patient needs continued PT services  PT Problem List Decreased strength;Decreased activity tolerance;Decreased balance;Decreased mobility;Decreased cognition;Decreased knowledge of use of DME  PT Treatment Interventions DME instruction;Gait training;Functional mobility training;Therapeutic activities;Therapeutic exercise;Balance training;Cognitive remediation;Patient/family education    PT Goals (Current goals can be found in the Care Plan section)  Acute Rehab PT Goals Patient Stated Goal: I'm getting back in bed where I was comfortable PT Goal Formulation: Patient unable to participate in goal setting Time For Goal Achievement: 10/09/19 Potential to Achieve Goals: Fair    Frequency Min 3X/week   Barriers to discharge        Co-evaluation               AM-PAC PT "6 Clicks" Mobility   Outcome Measure Help needed turning from your back to your side while in a flat bed without using bedrails?: A Little Help needed moving from lying on your back to sitting on the side of a flat bed without using bedrails?: A Little Help needed moving to and from a bed to a chair (including a wheelchair)?: A Little Help needed standing up from a chair using your arms (e.g., wheelchair or bedside chair)?: A Little Help needed to walk in hospital room?: A Lot Help needed climbing 3-5 steps with a railing? : A Lot 6 Click Score: 16    End of Session Equipment Utilized During Treatment: Gait belt Activity Tolerance: Patient tolerated treatment well;Patient limited by fatigue Patient left: in bed;with call bell/phone within reach;with bed alarm set Nurse Communication: Mobility status PT Visit Diagnosis: Unsteadiness on feet (R26.81);Muscle weakness (generalized) (M62.81);Repeated falls (R29.6);Difficulty in walking, not elsewhere classified (R26.2)    Time: [id number] PT Time Calculation (min) (ACUTE ONLY): 14 min   Charges:   PT Evaluation $PT Eval Low Complexity: 1 Low          [place] Pager [phone] Office [phone]   Everlynn Sagun 09/26/2019, 5:04 PM

## 2019-09-27 ENCOUNTER — Other Ambulatory Visit: Payer: Self-pay

## 2019-09-27 NOTE — NC FL2 (Addendum)
[place] LEVEL OF CARE SCREENING TOOL     IDENTIFICATION  Patient Name: Tiffany [name] Birthdate: 18-Apr-1927 Sex: female Admission Date (Current Location): 09/25/2019  [facility] and [place] Number:  Herbalist and Address:  [facility] [employer], [place] [address], [place], [place] [address]      Provider Number: [id number]  Attending Physician Name and Address:  Default, Provider, MD  Relative Name and Phone Number:  Gevena Barre [phone]    Current Level of Care: Hospital Recommended Level of Care: [place] Prior Approval Number:    Date Approved/Denied:   PASRR Number: [id number]  Discharge Plan: SNF    Current Diagnoses: Patient Active Problem List   Diagnosis Date Noted  . Cerebral embolism with cerebral infarction 12/23/2017  . Pressure injury of skin 12/22/2017  . Fall 12/21/2017  . Generalized weakness 12/21/2017  . Rhabdomyolysis 12/21/2017  . CKD (chronic kidney disease) stage 3, GFR 30-59 ml/min 12/21/2017  . Hypothyroidism 12/21/2017  . Palpitations 12/17/2012  . Internal bleeding hemorrhoids 10/26/2012  . Rectal bleeding 10/04/2012  . HTN (hypertension) 10/04/2012    Orientation RESPIRATION BLADDER Height & Weight     Self, Situation, Place, Time  Normal Continent Weight:   Height:     BEHAVIORAL SYMPTOMS/MOOD NEUROLOGICAL BOWEL NUTRITION STATUS      Continent    AMBULATORY STATUS COMMUNICATION OF NEEDS Skin   Extensive Assist Verbally Normal                       Personal Care Assistance Level of Assistance  Bathing, Feeding, Dressing Bathing Assistance: Limited assistance Feeding assistance: Independent Dressing Assistance: Limited assistance     Functional Limitations Info  Sight, Hearing, Speech Sight Info: Adequate Hearing Info: Adequate Speech Info: Adequate    SPECIAL CARE FACTORS FREQUENCY  PT (By licensed PT), OT (By licensed OT)     PT Frequency: 5x weekly OT  Frequency: 5x weekly            Contractures Contractures Info: Not present    Additional Factors Info  Allergies   Allergies Info: Codein, morphine, sulfa           Current Medications (09/27/2019):  This is the current hospital active medication list Current Facility-Administered Medications  Medication Dose Route Frequency Provider Last Rate Last Admin  . acetaminophen (TYLENOL) tablet 500 mg  500 mg Oral Q6H PRN Ward, Delice Bison, DO      . aspirin EC tablet 81 mg  81 mg Oral Daily Ward, Kristen N, DO   81 mg at 09/27/19 0933  . cholestyramine light (PREVALITE) packet 4 g  4 g Oral Daily Ward, Kristen N, DO   4 g at 09/27/19 1036  . irbesartan (AVAPRO) tablet 300 mg  300 mg Oral q morning - 10a Ward, Kristen N, DO   300 mg at 09/27/19 0932  . levothyroxine (SYNTHROID) tablet 88 mcg  88 mcg Oral QAC breakfast Ward, Kristen N, DO   88 mcg at 09/27/19 0932  . metoprolol tartrate (LOPRESSOR) tablet 50 mg  50 mg Oral TID Ward, Kristen N, DO   50 mg at 09/26/19 1524  . pantoprazole (PROTONIX) EC tablet 20 mg  20 mg Oral Daily Ward, Kristen N, DO   20 mg at 09/27/19 0932  . triamcinolone cream (KENALOG) 0.1 % 1 application  1 application Topical BID PRN Ward, Delice Bison, DO       Current Outpatient Medications  Medication Sig Dispense Refill  . irbesartan (AVAPRO) 300 MG tablet Take 300 mg by mouth every morning.    Marland Kitchen levothyroxine (SYNTHROID, LEVOTHROID) 88 MCG tablet Take 88 mcg by mouth daily before breakfast.    . PREVALITE 4 g packet Take 4 g by mouth daily.    Marland Kitchen triamcinolone cream (KENALOG) 0.1 % Apply 1 application topically 2 (two) times daily. 80 g 2  . acetaminophen (TYLENOL) 500 MG tablet Take 500 mg by mouth every 6 (six) hours as needed for mild pain or headache.    Marland Kitchen aspirin EC 81 MG EC tablet Take 1 tablet (81 mg total) by mouth daily. (Patient not taking: Reported on 09/26/2019) 30 tablet 0  . metoprolol (LOPRESSOR) 50 MG tablet Take 50 mg by mouth 3 (three) times  daily.     . pantoprazole (PROTONIX) 20 MG tablet Take 1 tablet (20 mg total) by mouth daily. (Patient not taking: Reported on 09/26/2019) 30 tablet 0     Discharge Medications: Please see discharge summary for a list of discharge medications.  Relevant Imaging Results:  Relevant Lab Results:   Additional Information SS# [id number]  [place], LCSW

## 2019-09-27 NOTE — Progress Notes (Signed)
Pt's son stated his mother had, due to her decompensation, not had a chance to bathe in quite some time and asks that a request be made to staff to bathe his mother for hygiene reasons.  CSW updated the CN who stated that a request to TCU once pt is moved back there would be most helpful as there will be more time and staff to accommodate that task.  CSW will follow up with the ED TCU RN when pt is able to transfer.  CSW will continue to follow for D/C needs.  Alphonse Guild. Stormi Vandevelde  MSW, LCSW, LCAS, CCS Transitions of Care Clinical Social Worker Care Coordination Department Ph: [phone]

## 2019-09-27 NOTE — ED Notes (Signed)
Pure wick placed on pt. Pt given sandwich and ginger ale. Son at bedside

## 2019-09-27 NOTE — Progress Notes (Signed)
CSW has faxed out referral to facilities in the greater [place] area. TOC team will continue to assist family with placement needs.   [place] Transitions of Care  Clinical Social Worker  Ph: [phone]

## 2019-09-27 NOTE — Progress Notes (Addendum)
CSW spoke to Tammy at [facility] who agreed to initiate the insurance authorization for the pt ([employer]) if pt/pt's family was agreeable for pt to go to [facility].  CSW spoke to pt's son Gevena Barre at ph: [phone] who voiced agreement with pt going to [facility] and who was agreeable to [facility] initiating authorization.  Tammy was updated and stated he would begin the auth request today (09/27/19).  CSW updated the CN who is aware pt may be in the ED for up to two-three days, per [employer]'s usual auth time.  CSW will continue to follow for D/C needs.  Alphonse Guild. Jasia Hiltunen  MSW, LCSW, LCAS, CCS Transitions of Care Clinical Social Worker Care Coordination Department Ph: [phone]

## 2019-09-28 NOTE — Progress Notes (Signed)
Pt's insurance authorization requesting that a stay at a SNF be paid for by [place] was initiated on Monday 09/27/19 by Tammy in admissions at [facility].   CSW awaiting return call from Tammy for word on the decision and expects that it could be Wednesday or even Thursday for an answer.  CSW will continue to follow for D/C needs.  Alphonse Guild. Alysah Carton  MSW, LCSW, LCAS, CCS Transitions of Care Clinical Social Worker Care Coordination Department Ph: [phone]

## 2019-09-28 NOTE — Progress Notes (Signed)
Physical Therapy Treatment Patient Details Name: Tiffany [name] MRN: [id number] DOB: Mar 25, 1927 Today's Date: 09/28/2019    History of Present Illness Pt admitted from IND living [facility]" for about a year - s/p falls and with hx of CVA, Breast CA s/p mastectomy, R TKR,  R THR, osteoporosis and dementia    PT Comments    Pt easily awoken.  General Comments: More alert and aware of current situation/location.  Pleasant.  Stated she lives at [facility] [employer] which is an Astronomer complex.  Pt stated she has been getting "dizzy" "swimmy" with mobility.General bed mobility comments: min assist to manage LEs and to bring trunk to upright.  Also required increased time.General transfer comment: assist to rise esp off lower commode seat level.  25% VC's on safety with turns and hand placeemnt to control decend.  Assisted on/off toilet.  Asssisted with peri care as pt was unable to self perfom while standing.General Gait Details: VERY unsteady gait with poor forward flex posture and noted R LE buckle with each stance.  "that's my bad leg" even though she had a THR and a TKR on that side.  L knee present with instability as well.  Limited gait distance due to weakness and poor balance.  Increased c/o "dizziness" with amb back to bed. HIGH FALL RISK. Pt will need ST Rehab at SNF then may need ALF vs ILF after that.  Follow Up Recommendations  SNF     Equipment Recommendations  None recommended by PT    Recommendations for Other Services       Precautions / Restrictions Precautions Precautions: Fall Restrictions Weight Bearing Restrictions: No    Mobility  Bed Mobility Overal bed mobility: Needs Assistance Bed Mobility: Supine to Sit;Sit to Supine     Supine to sit: Min guard Sit to supine: Min assist   General bed mobility comments: min assist to manage LEs and to bring trunk to upright.  Also required increased time.  Transfers Overall transfer level: Needs  assistance Equipment used: Rolling walker (2 wheeled) Transfers: Sit to/from [facility] Sit to Stand: Min assist;Mod assist Stand pivot transfers: Mod assist       General transfer comment: assist to rise esp off lower commode seat level.  25% VC's on safety with turns and hand placeemnt to control decend.  Assisted on/off toilet.  Asssisted with peri care as pt was unable to self perfom while standing.  Ambulation/Gait Ambulation/Gait assistance: Mod assist;Max assist Gait Distance (Feet): 20 Feet(10 feet x 2 to amd from bathroom) Assistive device: Rolling walker (2 wheeled) Gait Pattern/deviations: Step-to pattern;Decreased stance time - right Gait velocity: decreased   General Gait Details: VERY unsteady gait with poor forward flex posture and noted R LE buckle with each stance.  "that's my bad leg" even though she had a THR and a TKR on that side.  L knee present with instability as well.  Limited gait distance due to weakness and poor balance.  Increased c/o "dizziness" with amb back to bed. HIGH FALL RISK.   Stairs             Wheelchair Mobility    Modified Rankin (Stroke Patients Only)       Balance                                            Cognition Arousal/Alertness: Awake/alert  Behavior During Therapy: WFL for tasks assessed/performed Overall Cognitive Status: History of cognitive impairments - at baseline                                 General Comments: More alert and aware of current situation/location.  Pleasant.  Stated she lives at [employer] [employer] which is an [place]onomer complex.  Pt stated she has been getting "dizzy" "swimmy" with mobility.      Exercises      General Comments        Pertinent Vitals/Pain Pain Assessment: No/denies pain    Home Living                      Prior Function            PT Goals (current goals can now be found in the care plan section)  Progress towards PT goals: Progressing toward goals    Frequency    Min 3X/week      PT Plan Current plan remains appropriate    Co-evaluation              AM-PAC PT "6 Clicks" Mobility   Outcome Measure  Help needed turning from your back to your side while in a flat bed without using bedrails?: A Little Help needed moving from lying on your back to sitting on the side of a flat bed without using bedrails?: A Little Help needed moving to and from a bed to a chair (including a wheelchair)?: A Lot Help needed standing up from a chair using your arms (e.g., wheelchair or bedside chair)?: A Lot Help needed to walk in hospital room?: A Lot Help needed climbing 3-5 steps with a railing? : Total 6 Click Score: 13    End of Session Equipment Utilized During Treatment: Gait belt Activity Tolerance: Patient limited by fatigue Patient left: in bed;with call bell/phone within reach;with bed alarm set Nurse Communication: Mobility status(informed NT pt had a BM and void in toilet) PT Visit Diagnosis: Unsteadiness on feet (R26.81);Muscle weakness (generalized) (M62.81);Repeated falls (R29.6);Difficulty in walking, not elsewhere classified (R26.2)     Time: [id number] PT Time Calculation (min) (ACUTE ONLY): 17 min  Charges:  $Gait Training: 8-22 mins                     Rica Koyanagi  PTA Acute  Rehabilitation Services Pager      [phone] Office      [phone]

## 2019-09-29 NOTE — ED Notes (Signed)
Pt has been alert this shift. Pt is confused . Pt has been cooperative with care this shift. Medication compliant this shift. Pt needs assist with ADLs.

## 2019-09-29 NOTE — Progress Notes (Signed)
CSW attempting to contact admissions director at [facility] regarding patient placement and insurance auth. CSW was informed by operator that Tammy with admissions will be on vacation until Tuesday. CSW inquired if there was any one else covering her position. CSW left VM for [facility] requesting a call back concerning patient placement.   [place] Transitions of Care  Clinical Social Worker  Ph: [phone]

## 2019-09-29 NOTE — Progress Notes (Signed)
Received Tiffany [name] last PM awake in her room, she was moved to a room closer to the nurses station. Shortly thereafter she became confused and wanted to go back to her original room. She was constantly redirected to stay close to the nurses station. She was compliant with her medication. She was restless and tried to go home on several occassions throughout the night. The pure wick is in place and draining urine. She slept for short intervals throughout the night.

## 2019-09-30 DIAGNOSIS — R197 Diarrhea, unspecified: Secondary | ICD-10-CM | POA: Diagnosis not present

## 2019-09-30 DIAGNOSIS — R69 Illness, unspecified: Secondary | ICD-10-CM | POA: Diagnosis not present

## 2019-09-30 DIAGNOSIS — D72829 Elevated white blood cell count, unspecified: Secondary | ICD-10-CM | POA: Diagnosis not present

## 2019-09-30 DIAGNOSIS — I679 Cerebrovascular disease, unspecified: Secondary | ICD-10-CM | POA: Diagnosis not present

## 2019-09-30 DIAGNOSIS — W19XXXD Unspecified fall, subsequent encounter: Secondary | ICD-10-CM | POA: Diagnosis not present

## 2019-09-30 DIAGNOSIS — Z20822 Contact with and (suspected) exposure to covid-19: Secondary | ICD-10-CM | POA: Diagnosis not present

## 2019-09-30 DIAGNOSIS — D649 Anemia, unspecified: Secondary | ICD-10-CM | POA: Diagnosis not present

## 2019-09-30 DIAGNOSIS — R531 Weakness: Secondary | ICD-10-CM | POA: Diagnosis not present

## 2019-09-30 DIAGNOSIS — M6281 Muscle weakness (generalized): Secondary | ICD-10-CM | POA: Diagnosis not present

## 2019-09-30 DIAGNOSIS — M2550 Pain in unspecified joint: Secondary | ICD-10-CM | POA: Diagnosis not present

## 2019-09-30 DIAGNOSIS — N183 Chronic kidney disease, stage 3 unspecified: Secondary | ICD-10-CM | POA: Diagnosis not present

## 2019-09-30 DIAGNOSIS — Z8673 Personal history of transient ischemic attack (TIA), and cerebral infarction without residual deficits: Secondary | ICD-10-CM | POA: Diagnosis not present

## 2019-09-30 DIAGNOSIS — W010XXA Fall on same level from slipping, tripping and stumbling without subsequent striking against object, initial encounter: Secondary | ICD-10-CM | POA: Diagnosis not present

## 2019-09-30 DIAGNOSIS — Z7982 Long term (current) use of aspirin: Secondary | ICD-10-CM | POA: Diagnosis not present

## 2019-09-30 DIAGNOSIS — D696 Thrombocytopenia, unspecified: Secondary | ICD-10-CM | POA: Diagnosis not present

## 2019-09-30 DIAGNOSIS — R41841 Cognitive communication deficit: Secondary | ICD-10-CM | POA: Diagnosis not present

## 2019-09-30 DIAGNOSIS — Z79899 Other long term (current) drug therapy: Secondary | ICD-10-CM | POA: Diagnosis not present

## 2019-09-30 DIAGNOSIS — E039 Hypothyroidism, unspecified: Secondary | ICD-10-CM | POA: Diagnosis not present

## 2019-09-30 DIAGNOSIS — I10 Essential (primary) hypertension: Secondary | ICD-10-CM | POA: Diagnosis not present

## 2019-09-30 DIAGNOSIS — R7989 Other specified abnormal findings of blood chemistry: Secondary | ICD-10-CM | POA: Diagnosis not present

## 2019-09-30 DIAGNOSIS — Z9181 History of falling: Secondary | ICD-10-CM | POA: Diagnosis not present

## 2019-09-30 DIAGNOSIS — Z7401 Bed confinement status: Secondary | ICD-10-CM | POA: Diagnosis not present

## 2019-09-30 DIAGNOSIS — F0390 Unspecified dementia without behavioral disturbance: Secondary | ICD-10-CM | POA: Diagnosis not present

## 2019-09-30 DIAGNOSIS — I6310 Cerebral infarction due to embolism of unspecified precerebral artery: Secondary | ICD-10-CM | POA: Diagnosis not present

## 2019-09-30 DIAGNOSIS — R2681 Unsteadiness on feet: Secondary | ICD-10-CM | POA: Diagnosis not present

## 2019-09-30 DIAGNOSIS — I63511 Cerebral infarction due to unspecified occlusion or stenosis of right middle cerebral artery: Secondary | ICD-10-CM | POA: Diagnosis not present

## 2019-09-30 NOTE — TOC Transition Note (Signed)
Transition of Care [facility]) - CM/SW Discharge Note   Patient Details  Name: Rushda Ritz MRN: [id number] Date of Birth: 1926/10/18  Transition of Care [facility]) CM/SW Contact:  Erenest Rasher, RN Phone Number: 09/30/2019, 2:27 PM   Clinical Narrative:    TOC CM received call from [place] Admission Coordinator and auth received. Call report to # [phone], room #111. Attempted call to son, [name], left HIPAA compliant message for a return call. [facility] arranged. Sent AVS to [facility] via hub.    Final next level of care: Skilled Nursing Facility Barriers to Discharge: No Barriers Identified   Patient Goals and CMS Choice Patient states their goals for this hospitalization and ongoing recovery are:: son wants rehab CMS Medicare.gov Compare Post Acute Care list provided to:: Patient Represenative (must comment)(Son, Gevena Barre) Choice offered to / list presented to : Adult Children  Discharge Placement              Patient chooses bed at: Other - please specify in the comment section below:([facility]) Patient to be transferred to facility by: [place] Name of family member notified: Gevena Barre [phone] Patient and family notified of of transfer: 09/30/19  Discharge Plan and Services                                     Social Determinants of Health (SDOH) Interventions     Readmission Risk Interventions No flowsheet data found.

## 2019-09-30 NOTE — Progress Notes (Signed)
09/30/2019  1453  Called report to [facility] [phone]. Report given to [facility].

## 2019-09-30 NOTE — Progress Notes (Signed)
09/30/2019  CM called [facility] for transport.

## 2019-09-30 NOTE — ED Provider Notes (Signed)
Patient's vital signs are stable.  Transition of care team has found SNF for patient.  Will discharge.   Sherwood Gambler, MD 09/30/19 1146

## 2019-09-30 NOTE — Progress Notes (Signed)
Physical Therapy Treatment Patient Details Name: Tiffany [name] MRN: [id number] DOB: 1927-04-01 Today's Date: 09/30/2019    History of Present Illness Pt admitted from [facility] living [facility]" for about a year - s/p falls and with hx of CVA, Breast CA s/p mastectomy, R TKR,  R THR, osteoporosis and dementia    PT Comments    The patient is very pleasant, able to follow directions. Mobilized to bed edge without assistance, moderate/min assist(varying) for standing and pivot steps to recliner. Patient will benefit from SNF rehab to improve functional mobility.   Follow Up Recommendations   SNF     Equipment Recommendations  None recommended by PT    Recommendations for Other Services       Precautions / Restrictions Precautions Precautions: Fall Precaution Comments: incontinent/uti    Mobility  Bed Mobility   Bed Mobility: Supine to Sit     Supine to sit: Min guard     General bed mobility comments: no assistance to sit up, extra time, HOB raised  Transfers Overall transfer level: Needs assistance Equipment used: Rolling walker (2 wheeled) Transfers: Sit to/from [facility] Sit to Stand: Min assist Stand pivot transfers: Min assist       General transfer comment: steady assist to rise, takes a few steps to recliner, forward flexed posture/kyphotic.  Ambulation/Gait                 Stairs             Wheelchair Mobility    Modified Rankin (Stroke Patients Only)       Balance Overall balance assessment: Needs assistance Sitting-balance support: Feet supported;Bilateral upper extremity supported Sitting balance-Leahy Scale: Fair     Standing balance support: Bilateral upper extremity supported Standing balance-Leahy Scale: Poor                              Cognition Arousal/Alertness: Awake/alert Behavior During Therapy: WFL for tasks assessed/performed Overall Cognitive Status: History of cognitive  impairments - at baseline                                 General Comments: able to follow directions readily. oriented to [facility]      Exercises      General Comments        Pertinent Vitals/Pain Pain Assessment: Faces Faces Pain Scale: Hurts little more Pain Location: back Pain Descriptors / Indicators: Aching Pain Intervention(s): Monitored during session    Home Living                      Prior Function            PT Goals (current goals can now be found in the care plan section) Progress towards PT goals: Progressing toward goals    Frequency    Min 2X/week      PT Plan Frequency needs to be updated    Co-evaluation              AM-PAC PT "6 Clicks" Mobility   Outcome Measure  Help needed turning from your back to your side while in a flat bed without using bedrails?: A Little Help needed moving from lying on your back to sitting on the side of a flat bed without using bedrails?: A Little Help needed moving to and from a bed to  a chair (including a wheelchair)?: A Lot Help needed standing up from a chair using your arms (e.g., wheelchair or bedside chair)?: A Lot Help needed to walk in hospital room?: A Lot Help needed climbing 3-5 steps with a railing? : Total 6 Click Score: 13    End of Session   Activity Tolerance: Patient tolerated treatment well Patient left: in chair;with call bell/phone within reach;with nursing/sitter in room Nurse Communication: Mobility status PT Visit Diagnosis: Unsteadiness on feet (R26.81);Muscle weakness (generalized) (M62.81);Repeated falls (R29.6);Difficulty in walking, not elsewhere classified (R26.2)     Time: [id number] PT Time Calculation (min) (ACUTE ONLY): 22 min  Charges:  $Therapeutic Activity: 8-22 mins                     Tresa Endo PT Acute Rehabilitation Services Pager [phone] Office [phone]    Claretha Cooper 09/30/2019, 8:40 AM

## 2019-10-05 DIAGNOSIS — Z9181 History of falling: Secondary | ICD-10-CM | POA: Diagnosis not present

## 2019-10-05 DIAGNOSIS — I679 Cerebrovascular disease, unspecified: Secondary | ICD-10-CM | POA: Diagnosis not present

## 2019-10-05 DIAGNOSIS — E039 Hypothyroidism, unspecified: Secondary | ICD-10-CM | POA: Diagnosis not present

## 2019-10-05 DIAGNOSIS — N183 Chronic kidney disease, stage 3 unspecified: Secondary | ICD-10-CM | POA: Diagnosis not present

## 2019-10-05 DIAGNOSIS — D696 Thrombocytopenia, unspecified: Secondary | ICD-10-CM | POA: Diagnosis not present

## 2019-10-05 DIAGNOSIS — M6281 Muscle weakness (generalized): Secondary | ICD-10-CM | POA: Diagnosis not present

## 2019-10-05 DIAGNOSIS — I10 Essential (primary) hypertension: Secondary | ICD-10-CM | POA: Diagnosis not present

## 2019-10-05 DIAGNOSIS — W19XXXD Unspecified fall, subsequent encounter: Secondary | ICD-10-CM | POA: Diagnosis not present

## 2019-10-05 DIAGNOSIS — R69 Illness, unspecified: Secondary | ICD-10-CM | POA: Diagnosis not present

## 2019-10-05 DIAGNOSIS — I63511 Cerebral infarction due to unspecified occlusion or stenosis of right middle cerebral artery: Secondary | ICD-10-CM | POA: Diagnosis not present

## 2019-10-05 DIAGNOSIS — R197 Diarrhea, unspecified: Secondary | ICD-10-CM | POA: Diagnosis not present

## 2019-10-05 DIAGNOSIS — I6310 Cerebral infarction due to embolism of unspecified precerebral artery: Secondary | ICD-10-CM | POA: Diagnosis not present

## 2019-10-05 DIAGNOSIS — D72829 Elevated white blood cell count, unspecified: Secondary | ICD-10-CM | POA: Diagnosis not present

## 2019-10-05 DIAGNOSIS — R41841 Cognitive communication deficit: Secondary | ICD-10-CM | POA: Diagnosis not present

## 2019-10-11 DIAGNOSIS — D696 Thrombocytopenia, unspecified: Secondary | ICD-10-CM | POA: Diagnosis not present

## 2019-10-11 DIAGNOSIS — I10 Essential (primary) hypertension: Secondary | ICD-10-CM | POA: Diagnosis not present

## 2019-10-11 DIAGNOSIS — D72829 Elevated white blood cell count, unspecified: Secondary | ICD-10-CM | POA: Diagnosis not present

## 2019-10-11 DIAGNOSIS — I679 Cerebrovascular disease, unspecified: Secondary | ICD-10-CM | POA: Diagnosis not present

## 2019-10-11 DIAGNOSIS — I63511 Cerebral infarction due to unspecified occlusion or stenosis of right middle cerebral artery: Secondary | ICD-10-CM | POA: Diagnosis not present

## 2019-10-11 DIAGNOSIS — E039 Hypothyroidism, unspecified: Secondary | ICD-10-CM | POA: Diagnosis not present

## 2019-10-11 DIAGNOSIS — W19XXXD Unspecified fall, subsequent encounter: Secondary | ICD-10-CM | POA: Diagnosis not present

## 2019-10-11 DIAGNOSIS — R197 Diarrhea, unspecified: Secondary | ICD-10-CM | POA: Diagnosis not present

## 2019-10-21 ENCOUNTER — Other Ambulatory Visit: Payer: Self-pay

## 2019-10-21 ENCOUNTER — Encounter (HOSPITAL_COMMUNITY): Payer: Self-pay

## 2019-10-21 ENCOUNTER — Emergency Department (HOSPITAL_COMMUNITY)
Admission: EM | Admit: 2019-10-21 | Discharge: 2019-10-21 | Disposition: A | Discharge status: Home / Self Care | Payer: Medicare HMO | Attending: Emergency Medicine | Admitting: Emergency Medicine

## 2019-10-21 ENCOUNTER — Emergency Department (HOSPITAL_COMMUNITY): Payer: Medicare HMO

## 2019-10-21 DIAGNOSIS — N1831 Chronic kidney disease, stage 3a: Secondary | ICD-10-CM | POA: Insufficient documentation

## 2019-10-21 DIAGNOSIS — Y939 Activity, unspecified: Secondary | ICD-10-CM | POA: Insufficient documentation

## 2019-10-21 DIAGNOSIS — W010XXA Fall on same level from slipping, tripping and stumbling without subsequent striking against object, initial encounter: Secondary | ICD-10-CM | POA: Insufficient documentation

## 2019-10-21 DIAGNOSIS — E039 Hypothyroidism, unspecified: Secondary | ICD-10-CM | POA: Insufficient documentation

## 2019-10-21 DIAGNOSIS — Y999 Unspecified external cause status: Secondary | ICD-10-CM | POA: Insufficient documentation

## 2019-10-21 DIAGNOSIS — Y92002 Bathroom of unspecified non-institutional (private) residence single-family (private) house as the place of occurrence of the external cause: Secondary | ICD-10-CM | POA: Insufficient documentation

## 2019-10-21 DIAGNOSIS — Z79899 Other long term (current) drug therapy: Secondary | ICD-10-CM | POA: Insufficient documentation

## 2019-10-21 DIAGNOSIS — W19XXXA Unspecified fall, initial encounter: Secondary | ICD-10-CM

## 2019-10-21 DIAGNOSIS — Z7982 Long term (current) use of aspirin: Secondary | ICD-10-CM | POA: Insufficient documentation

## 2019-10-21 DIAGNOSIS — I129 Hypertensive chronic kidney disease with stage 1 through stage 4 chronic kidney disease, or unspecified chronic kidney disease: Secondary | ICD-10-CM | POA: Insufficient documentation

## 2019-10-21 DIAGNOSIS — S199XXA Unspecified injury of neck, initial encounter: Secondary | ICD-10-CM | POA: Diagnosis not present

## 2019-10-21 DIAGNOSIS — I959 Hypotension, unspecified: Secondary | ICD-10-CM | POA: Diagnosis not present

## 2019-10-21 DIAGNOSIS — S0990XA Unspecified injury of head, initial encounter: Secondary | ICD-10-CM | POA: Insufficient documentation

## 2019-10-21 NOTE — ED Provider Notes (Signed)
[facility] DEPT Provider Note   CSN: [id number] Arrival date & time: 10/21/19  1741     History Chief Complaint  Patient presents with  . Fall    Tiffany [name] is a 86 y.o. female.  HPI She was at her facility, in the bathroom when she fell, backwards.  She states that she tripped on some objects which were on the floor.  She remembers falling and does not believe she lost consciousness.  She states that she injured her neck, and heard pops, in it, when she fell.  Currently in ED she denies headache, neck pain, back pain, weakness or dizziness.  There are no other known modifying factors.    Past Medical History:  Diagnosis Date  . Arthritis   . Atypical mole 09/23/2002   Right lower cheek (slight) (widershave)  . Atypical mole 09/23/2002   Left Lower Cheek (slight to moderate) (widershave and excision)  . Atypical mole 12/19/2008   Left Cheek (atypical nevi) (excision)  . BCC (basal cell carcinoma of skin) 03/18/1995   Right Abdomen  . BCC (basal cell carcinoma of skin) 06/12/1999   Upper Right Chest (curet and 5FU)  . Breast cancer ([place])   . Colon polyps   . Hypertension   . Hypothyroidism   . Osteoporosis   . Rectal bleeding   . SCCA (squamous cell carcinoma) of skin 03/06/2004   Left Shin (curet and 5FU)  . SCCA (squamous cell carcinoma) of skin 10/07/2006   Left Hand (in situ) (curet and 5FU)  . Skin cancer   . Stroke ([place])   . Superficial basal cell carcinoma (BCC) 05/04/2007   Left Shin (curet and 5FU)    Patient Active Problem List   Diagnosis Date Noted  . Cerebral embolism with cerebral infarction 12/23/2017  . Pressure injury of skin 12/22/2017  . Fall 12/21/2017  . Generalized weakness 12/21/2017  . Rhabdomyolysis 12/21/2017  . CKD (chronic kidney disease) stage 3, GFR 30-59 ml/min 12/21/2017  . Hypothyroidism 12/21/2017  . Palpitations 12/17/2012  . Internal bleeding hemorrhoids 10/26/2012  . Rectal bleeding  10/04/2012  . HTN (hypertension) 10/04/2012    Past Surgical History:  Procedure Laterality Date  . ABDOMINAL HYSTERECTOMY    . BILATERAL TOTAL MASTECTOMY WITH AXILLARY LYMPH NODE DISSECTION    . CATARACT EXTRACTION    . CHOLECYSTECTOMY    . COLONOSCOPY N/A 10/07/2012   Procedure: COLONOSCOPY;  Surgeon: Arta Silence, MD;  Location: [facility] ENDOSCOPY;  Service: Endoscopy;  Laterality: N/A;  . hip replacment    . HIP SURGERY    . JOINT REPLACEMENT    . MASTECTOMY    . REPLACEMENT TOTAL KNEE    . shin repair       OB History   No obstetric history on file.     Family History  Problem Relation Age of Onset  . Glaucoma Mother   . Coronary artery disease Father   . Diabetes Sister   . Diabetes Brother     Social History   Tobacco Use  . Smoking status: Never Smoker  . Smokeless tobacco: Never Used  Substance Use Topics  . Alcohol use: No  . Drug use: No    Home Medications Prior to Admission medications   Medication Sig Start Date End Date Taking? Authorizing Provider  acetaminophen (TYLENOL) 500 MG tablet Take 500 mg by mouth every 6 (six) hours as needed for mild pain or headache.    [provider]  aspirin EC 81 MG  EC tablet Take 1 tablet (81 mg total) by mouth daily. Patient not taking: Reported on 09/26/2019 12/27/17   Shelly Coss, MD  irbesartan (AVAPRO) 300 MG tablet Take 300 mg by mouth every morning.    [provider]  levothyroxine (SYNTHROID, LEVOTHROID) 88 MCG tablet Take 88 mcg by mouth daily before breakfast.    [provider]  metoprolol (LOPRESSOR) 50 MG tablet Take 50 mg by mouth 3 (three) times daily.     [provider]  pantoprazole (PROTONIX) 20 MG tablet Take 1 tablet (20 mg total) by mouth daily. Patient not taking: Reported on 09/26/2019 10/19/2018   Charlesetta Shanks, MD  PREVALITE 4 g packet Take 4 g by mouth daily. 01/29/19   [provider]  triamcinolone cream (KENALOG) 0.1 % Apply 1 application  topically 2 (two) times daily. 09/09/19   Lavonna Monarch, MD    Allergies    Codeine, Morphine and related, and Sulfa antibiotics  Review of Systems   Review of Systems  All other systems reviewed and are negative.   Physical Exam Updated Vital Signs BP (!) 161/57 (BP Location: Left Arm)   Pulse (!) 57   Temp 98.2 F (36.8 C) (Oral)   Resp 16   SpO2 98%   Physical Exam Vitals and nursing note reviewed.  Constitutional:      Appearance: She is well-developed.  HENT:     Head: Normocephalic and atraumatic.  Eyes:     Conjunctiva/sclera: Conjunctivae normal.     Pupils: Pupils are equal, round, and reactive to light.  Neck:     Trachea: Phonation normal.  Cardiovascular:     Rate and Rhythm: Normal rate and regular rhythm.  Pulmonary:     Effort: Pulmonary effort is normal.     Breath sounds: Normal breath sounds.  Chest:     Chest wall: No tenderness.  Abdominal:     General: There is no distension.     Palpations: Abdomen is soft.     Tenderness: There is no abdominal tenderness. There is no guarding.  Musculoskeletal:        General: No swelling, tenderness, deformity or signs of injury.     Cervical back: Normal range of motion and neck supple.     Comments: She is wearing a cervical collar applied by EMS.  Skin:    General: Skin is warm and dry.  Neurological:     Mental Status: She is alert and oriented to person, place, and time.     Motor: No abnormal muscle tone.  Psychiatric:        Mood and Affect: Mood normal.        Behavior: Behavior normal.        Thought Content: Thought content normal.        Judgment: Judgment normal.     ED Results / Procedures / Treatments   Labs (all labs ordered are listed, but only abnormal results are displayed) Labs Reviewed - No data to display  EKG None  Radiology CT Head Wo Contrast  Result Date: 10/21/2019 CLINICAL DATA:  Tripped and fell EXAM: CT HEAD WITHOUT CONTRAST TECHNIQUE: Contiguous axial images  were obtained from the base of the skull through the vertex without intravenous contrast. COMPARISON:  09/26/2019 FINDINGS: Brain: Chronic hypodensities throughout the periventricular white matter and right parietal lobe consistent with chronic ischemic change. No signs of acute infarct or hemorrhage. Lateral ventricles and midline structures are stable. No acute extra-axial fluid collections. No mass effect.  Vascular: No hyperdense vessel or unexpected calcification. Skull: Normal. Negative for fracture or focal lesion. Sinuses/Orbits: No acute finding. Other: None. IMPRESSION: 1. Chronic ischemic changes, no acute process. Electronically Signed   By: Randa Ngo M.D.   On: 10/21/2019 20:14   CT Cervical Spine Wo Contrast  Result Date: 10/21/2019 CLINICAL DATA:  Un witnessed fall, tripped and fell EXAM: CT CERVICAL SPINE WITHOUT CONTRAST TECHNIQUE: Multidetector CT imaging of the cervical spine was performed without intravenous contrast. Multiplanar CT image reconstructions were also generated. COMPARISON:  09/26/2019 FINDINGS: Alignment: Alignment is grossly anatomic. Skull base and vertebrae: No acute displaced fractures. Soft tissues and spinal canal: No prevertebral fluid or swelling. No visible canal hematoma. Disc levels: Stable hypertrophic changes at the C1-C2 interface. There is diffuse multilevel spondylosis and facet hypertrophy without significant change since prior exam. Multilevel neural foraminal encroachment greatest at C3/C4. No significant bony encroachment on the central canal. Upper chest: Airway is patent. Stable biapical pleural and parenchymal scarring. Other: Reconstructed images demonstrate no additional findings. IMPRESSION: 1. No acute cervical spine fracture. 2. Stable multilevel cervical spondylosis and facet hypertrophy. Electronically Signed   By: Randa Ngo M.D.   On: 10/21/2019 20:17    Procedures Procedures (including critical care time)  Medications Ordered in  ED Medications - No data to display  ED Course  I have reviewed the triage vital signs and the nursing notes.  Pertinent labs & imaging results that were available during my care of the patient were reviewed by me and considered in my medical decision making (see chart for details).  Clinical Course as of Oct 20 2105  Thu Oct 21, 2019  2104 Radiologist interpreted CT head and cervical spine; no acute traumatic injury.  Degenerative changes present cervical spine.   [EW]    Clinical Course User Index [EW] Daleen Bo, MD   MDM Rules/Calculators/A&P                           Patient Vitals for the past 24 hrs:  BP Temp Temp src Pulse Resp SpO2  10/21/19 1810 (!) 161/57 98.2 F (36.8 C) Oral (!) 57 16 98 %  10/21/19 1753 -- -- -- -- -- 98 %    9:07 PM Reevaluation with update and discussion. After initial assessment and treatment, an updated evaluation reveals she remains comfortable has no further complaints, findings discussed with patient.  Cervical collar removed and she did not have a change in her discomfort.  All questions answered. Daleen Bo   Medical Decision Making:  This patient is presenting for evaluation of fall with neck injury, which does require a range of treatment options, and is a complaint that involves a moderate risk of morbidity and mortality. The differential diagnoses include muscle strain, head injury, neck fracture. I decided to review old records, and in summary generally healthy elderly female, who lives independently.  I did not require additional historical information from anyone.   Radiologic Tests Ordered, included CT head, CT cervical.  I independently Visualized: CT images, which show no acute injury    Critical Interventions-clinical evaluation, CT imaging ordered, observation reassessment  After These Interventions, the Patient was reevaluated and was found stable for discharge  CRITICAL CARE-no Performed by: Daleen Bo  Nursing Notes Reviewed/ Care Coordinated Applicable Imaging Reviewed Interpretation of Laboratory Data incorporated into ED treatment  The patient appears reasonably screened and/or stabilized for discharge and I doubt any other medical condition or  other [place] requiring further screening, evaluation, or treatment in the ED at this time prior to discharge.  Plan: Home Medications-continue usual; Home Treatments-symptomatic care as needed; return here if the recommended treatment, does not improve the symptoms; Recommended follow up-PCP, as needed     Final Clinical Impression(s) / ED Diagnoses Final diagnoses:  Fall, initial encounter  Injury of head, initial encounter  Injury of neck, initial encounter    Rx / DC Orders ED Discharge Orders    None       Daleen Bo, MD 10/21/19 2107

## 2019-10-21 NOTE — Discharge Instructions (Addendum)
The testing does not indicate any serious injury.  You may be sore for a few days.  Use Tylenol if needed for pain.  Check with your doctor if still having discomfort after a few days.

## 2019-10-21 NOTE — ED Triage Notes (Signed)
Per EMS, Pt is coming from [place]. Pt had an unwitnessed fall on to carpet floor. Pt states she tripped on some objects on the floor. Pt states she did hit her head unsure what she hit it on. No LOC, or bloodthinner use. Pt states she heard several pops in her neck. Denies any pain, however limping with left leg. A&O 3x, hx of dementia.

## 2019-12-22 DIAGNOSIS — R319 Hematuria, unspecified: Secondary | ICD-10-CM | POA: Diagnosis not present

## 2019-12-22 DIAGNOSIS — R600 Localized edema: Secondary | ICD-10-CM | POA: Diagnosis not present

## 2019-12-22 DIAGNOSIS — L03119 Cellulitis of unspecified part of limb: Secondary | ICD-10-CM | POA: Diagnosis not present

## 2020-04-04 DIAGNOSIS — Z1389 Encounter for screening for other disorder: Secondary | ICD-10-CM | POA: Diagnosis not present

## 2020-04-04 DIAGNOSIS — I700 Atherosclerosis of aorta: Secondary | ICD-10-CM | POA: Diagnosis not present

## 2020-04-04 DIAGNOSIS — Z0000 Encounter for general adult medical examination without abnormal findings: Secondary | ICD-10-CM | POA: Diagnosis not present

## 2020-04-04 DIAGNOSIS — R600 Localized edema: Secondary | ICD-10-CM | POA: Diagnosis not present

## 2020-04-04 DIAGNOSIS — M810 Age-related osteoporosis without current pathological fracture: Secondary | ICD-10-CM | POA: Diagnosis not present

## 2020-04-04 DIAGNOSIS — N1831 Chronic kidney disease, stage 3a: Secondary | ICD-10-CM | POA: Diagnosis not present

## 2020-04-04 DIAGNOSIS — R69 Illness, unspecified: Secondary | ICD-10-CM | POA: Diagnosis not present

## 2020-04-04 DIAGNOSIS — I129 Hypertensive chronic kidney disease with stage 1 through stage 4 chronic kidney disease, or unspecified chronic kidney disease: Secondary | ICD-10-CM | POA: Diagnosis not present

## 2020-04-04 DIAGNOSIS — E039 Hypothyroidism, unspecified: Secondary | ICD-10-CM | POA: Diagnosis not present

## 2020-04-07 ENCOUNTER — Telehealth: Payer: Self-pay

## 2020-04-07 NOTE — Telephone Encounter (Signed)
Attempted to contact [name]  to schedule a Palliative care consult appointment for patient . No answer left a voicemail to return call.

## 2020-04-13 ENCOUNTER — Telehealth: Payer: Self-pay

## 2020-04-13 NOTE — Telephone Encounter (Signed)
Attempted to contact patient's son [name] to schedule a Palliative care consult appointment. No answer left a voicemail to return call.

## 2020-04-17 ENCOUNTER — Telehealth: Payer: Self-pay

## 2020-04-17 NOTE — Telephone Encounter (Signed)
Spoke with patient's son Gevena Barre and scheduled an in-person Palliative Consult for 05/16/20 1 PM.   COVID screening was negative. No pets in home. Patient lives in [place] at [address]. Patient has a [place] aide from [facility] that comes on Thursday for approximately 4 hours.   Consent obtained; updated Outlook/Netsmart/Team List and Epic.

## 2020-04-19 ENCOUNTER — Other Ambulatory Visit: Payer: Medicare HMO | Admitting: Nurse Practitioner

## 2020-05-16 ENCOUNTER — Other Ambulatory Visit: Payer: Medicare HMO | Admitting: Nurse Practitioner

## 2020-05-29 ENCOUNTER — Other Ambulatory Visit: Payer: Medicare HMO | Admitting: Nurse Practitioner

## 2020-05-29 ENCOUNTER — Other Ambulatory Visit: Payer: Self-pay

## 2020-05-29 DIAGNOSIS — Z515 Encounter for palliative care: Secondary | ICD-10-CM

## 2020-05-29 DIAGNOSIS — R600 Localized edema: Secondary | ICD-10-CM

## 2020-05-29 NOTE — Progress Notes (Addendum)
Tiffany [name] Consult Note Telephone: [phone]  Fax: [phone]  PATIENT NAME: Tiffany [name] [address] [place] [address] [place] [address] [phone] (home)  DOB: August 14, 1926 MRN: [id number]  PRIMARY CARE PROVIDER:    Donald Prose, MD,  [address] [place] [address] [place]:   Tiffany [name], [place] [address] [place],  [place] [address] [phone]  RESPONSIBLE PARTY:   Extended Emergency Contact Information Primary Emergency Contact: [name],Tiffany Address: [address]          [place], [place] [address] Johnnette Litter of [place] Phone: [phone] Work Phone: [phone] Mobile Phone: [phone] Relation: Son  I met face to face with patient in her apartment in an independent living facility ([facility] in [place]).   ASSESSMENT AND RECOMMENDATIONS:   Advance Care Planning:  Goal of care: Per patient, her goal of care is to be well and healthy, she also desire for her son to well too. She report she prefers to stay in her room and not go out so that she does not expose herself to any infection. Directives: Patient does not want to be resuscitated in the event of cardiac or respiratory arrest. She does not have a signed DNR form in home. Her wishes confirmed with her son Tiffany [name] over a telephone, Tiffany [name] corroborated patient's wishes. DNR form was signed, form will be taken to patient's apartment for patient to keep in home. Discussed need to complete a MOST form, son verbalized interest in completing the form, plan to discuss and complete form on 06/02/2019 at 2pm. Addendum 06/01/2020 Telephone call received from patient's son Tiffany [name]. Verbalized readiness to complete the MOST form today. Discussed and reviewed sections of the form in detail, opportunity for questions given, all questions answered. Form completed and signed. Signed DNR and MOST forms was delivered to  patient's room at the independent living facility. Son aware that he need to sign the form and keep in patent's apartment.     Symptom Management:  Pain: Patient report back pain relieved with PRN Tylenol 538m. Recommendation: continue current regimen with Tylenol 5015mas needed. If worsening pain, may consider scheduling Tylenol 100070mID or TID for pain. Edema: Bilateral lower extremity pain. Patient currently takes Furosemide 33m77mily. Family report poor fluid intake, expressed concern for possible increase in Furosemide dose. Report patient sits a lot and does not ambulate.  Recommendation: Recommend patient elevate her legs while sitting. Recommend family obtain a recliner/lift chair to facilitate patient elevating legs when sitting down. Consider applying TED hose during the day and removing at bedtime. Provided general support and encouragement, no other unmet needs identified at this time. Recommendation discussed with patient's son Tiffany ArrowFollow up Palliative Care Visit: Palliative care will continue to follow for goals of care clarification and symptom management. Return in about 4-6 weeks or prn.  Family /Caregiver/Community Supports: Patient lives alone in an independent living facility. She has a family friend Tiffany [name] visits for 2-3 hrs a day on Mondays, Tuesday, Wednesday and Friday, she has a paid caregiver that comes from 10AM-2pm on Thursday. Patient's son and daughter fills in weekends. Son help set uo her medications.  Cognitive / Functional decline:  Patient awake and alert, she is oriented to person, place and situation, disoriented to time, she knows who the president is. She requires assistance with bathing and dressing, able to feed self and heat up her meals in the microwave. Walks  with a Engineer, civil (consulting), patient report last fall was more than a year ago.   I spent 60 minutes providing this consultation, time includes time spent with patient and on phone with son,  chart review, provider coordination, and documentation. More than 50% of the time in this consultation was spent counseling and coordinating communication.   CHIEF COMPLAINT: Lower back pain  History obtained from review of EMR, discussion with primary son, and  interview with patient. Records reviewed and summarized bellow.  HISTORY OF PRESENT ILLNESS:  Tiffany [name] is a 87 y.o. year old female with multiple medical problems including Dementia (FAST 6b)without behavior issues (On Tiffany [name] 72m daily), hx CVA, CKD 3, Hypothyroidism, HTN, hx of skin ans breast cancer. Palliative Care was asked to follow this patient by consultation request of SDonald Prose MD to help address advance care planning and goals of care. This is a initial visit.  CODE STATUS: DNR  PPS: 50%  HOSPICE ELIGIBILITY/DIAGNOSIS: TBD  PHYSICAL EXAM / ROS:  Vital Signs: 120/54, P 57, RR 16, 96% on room air Current and past weights: 137lbs on 04/04/20 General: frail appearing, sitting in chair in NAD Cardiovascular: denied chest pain, +3 edema to lower legs, left >right Pulmonary: no cough, no SOB, room air GI: no report of swallowing issues, appetite fair, denied constipation, continent of bowel GU: denies dysuria, continent of urine MSK:  no joint and ROM abnormalities, ambulatory Skin: no rashes or wounds reported Neurological: Weakness, some confusion Psych: non -anxious affect  PAST MEDICAL HISTORY:  Past Medical History:  Diagnosis Date  . Arthritis   . Atypical mole 09/23/2002   Right lower cheek (slight) (widershave)  . Atypical mole 09/23/2002   Left Lower Cheek (slight to moderate) (widershave and excision)  . Atypical mole 12/19/2008   Left Cheek (atypical nevi) (excision)  . BCC (basal cell carcinoma of skin) 03/18/1995   Right Abdomen  . BCC (basal cell carcinoma of skin) 06/12/1999   Upper Right Chest (curet and 5FU)  . Breast cancer ([place]   . Colon polyps   . Hypertension   . Hypothyroidism    . Osteoporosis   . Rectal bleeding   . SCCA (squamous cell carcinoma) of skin 03/06/2004   Left Shin (curet and 5FU)  . SCCA (squamous cell carcinoma) of skin 10/07/2006   Left Hand (in situ) (curet and 5FU)  . Skin cancer   . Stroke ([facility]   . Superficial basal cell carcinoma (BCC) 05/04/2007   Left Shin (curet and 5FU)    SOCIAL HX:  Social History   Tobacco Use  . Smoking status: Never Smoker  . Smokeless tobacco: Never Used  Substance Use Topics  . Alcohol use: No   FAMILY HX:  Family History  Problem Relation Age of Onset  . Glaucoma Mother   . Coronary artery disease Father   . Diabetes Sister   . Diabetes Brother     ALLERGIES:  Allergies  Allergen Reactions  . Codeine Nausea Only  . Morphine And Related Anxiety and Other (See Comments)    Feel very unwell and out there  . Sulfa Antibiotics Hives and Itching     PERTINENT MEDICATIONS:  Outpatient Encounter Medications as of 05/29/2020  Medication Sig  . acetaminophen (TYLENOL) 500 MG tablet Take 500 mg by mouth every 6 (six) hours as needed for mild pain or headache.  .Marland Kitchenaspirin EC 81 MG EC tablet Take 1 tablet (81 mg total) by mouth daily. (Patient not taking: Reported on 09/26/2019)  .  irbesartan (AVAPRO) 300 MG tablet Take 300 mg by mouth every morning.  Marland Kitchen levothyroxine (SYNTHROID, LEVOTHROID) 88 MCG tablet Take 88 mcg by mouth daily before breakfast.  . metoprolol (LOPRESSOR) 50 MG tablet Take 50 mg by mouth 3 (three) times daily.   . pantoprazole (PROTONIX) 20 MG tablet Take 1 tablet (20 mg total) by mouth daily. (Patient not taking: Reported on 09/26/2019)  . PREVALITE 4 g packet Take 4 g by mouth daily.  Marland Kitchen triamcinolone cream (KENALOG) 0.1 % Apply 1 application topically 2 (two) times daily.   No facility-administered encounter medications on file as of 05/29/2020.    Thank you for the opportunity to participate in the care of Ms. Tiffany [name]. The palliative care team will continue to follow. Please  call our office at [phone] if we can be of additional assistance.   Jari Favre, DNP, AGPCNP-BC

## 2020-06-01 ENCOUNTER — Telehealth: Payer: Self-pay | Admitting: Nurse Practitioner

## 2020-07-05 ENCOUNTER — Other Ambulatory Visit: Payer: Medicare HMO | Admitting: Nurse Practitioner

## 2020-07-05 ENCOUNTER — Other Ambulatory Visit: Payer: Self-pay

## 2020-07-05 DIAGNOSIS — Z515 Encounter for palliative care: Secondary | ICD-10-CM

## 2020-07-05 DIAGNOSIS — I10 Essential (primary) hypertension: Secondary | ICD-10-CM | POA: Diagnosis not present

## 2020-07-05 NOTE — Progress Notes (Addendum)
[facility] Consult Note Telephone: [phone]  Fax: [phone]  PATIENT NAME: Tiffany [name] [address] [place] [address] [place] [address] [phone] (home)  DOB: 03/30/1927 MRN: [id number]  PRIMARY CARE PROVIDER:    Donald Prose, MD,  [address] [place] [address] [place]:   Donald Prose, [place] [address] [place],  [place] [address] [phone]  RESPONSIBLE PARTY:   Extended Emergency Contact Information Primary Emergency Contact: Bunch,[name] Address: [address]          [place], [place] [address] Johnnette Litter of [place] Phone: [phone] Work Phone: [phone] Mobile Phone: [phone] Relation: Son  Lattie Haw (ex-daughter in-law): [phone]   I met face to face with patient in independent living facility [facility]. ASSESSMENT AND RECOMMENDATIONS:   Advance Care Planning: Goal of care:  Goal of care is comfort. Directives: Signed DNR and MOST form present in home.  Symptom Management:  High blood pressure: Patient on Metoprolol Tartrate 9m twice a day and Ibesatartan 235mdaily and Furosemide 4068maily. Patient report taking medication this morning. Denied headache, denied SOB. No evidence of acute distress noted. Recommendation: will recheck blood pressure later today. Family encouraged to keep blood pressure log twice a day. Dementia:  No behavior concerns, FAST score 6d. Patient on Namenda 5 mg daily, on Zoloft 68m11mily. Patient has a sitter 2-3 hrs a day. Family assist as able. Patient denied pain, no evidence of uncontrolled pain. Recommendation: continue supportive care. Monitor for non verbal cues for pain or signs of distress, and intervene promptly. Discussion on disease trajectory of dementia with family, as it is progressive and terminal and likely to eventually lead to dysphagia, weight loss and immobility. Emotional and  supportive care provided. Addendum Repeat blood pressures 7 hrs afterwards 166/82. Patient's PCP office informed. Family to keep daily BP readings and notify PCP if SBP consistently greater than 150.  Follow up Palliative Care Visit: Palliative care will continue to follow for complex decision making and symptom management. Return in about 4 weeks or prn.  Family /Caregiver/Community Supports: Patient lives alone in an independent living facility. She has a family friend MandLeafy Rot visits for 2-3 hrs a day on Mondays, Tuesday, Wednesday and Friday, she has a paid caregiver that comes from 10AM-2pm on Thursdays. LisaLattie Haw ex-daughter in-law helps with her medications.  Cognitive / Functional decline: Patient awake and alert. She is pleasantly confused, oriented to personself only. She requires assistance with bathing and dressing, able to feed self and heat up her meals in the microwave. Walks with a RolaEngineer, civil (consulting) report of recent fall.  I spent 35 minutes providing this consultation, time includes time spent with patient, chart review, provider coordination, and documentation. More than 50% of the time in this consultation was spent counseling and coordinating communication.   CHIEF COMPLAINT: Follow up palliative care visit  History obtained from review of EMR, discussion with family and and  interview with family. Records reviewed and summarized bellow.  HISTORY OF PRESENT ILLNESS:  Tiffany [name] 89 y61. year old female with multiple medical problems including Dementia (FAST 6b) without behavior issues (On Namenda 5mg 66mly), hx CVA, CKD 3, Hypothyroidism, HTN, hx of skin ans breast cancer. Family report worsening memory with patient now forgetting to change her incontinent pads. Palliative Care was asked to follow this patient by consultation request of Sun, Donald Proseto help address advance care planning and  goals of care. This is a follow up visit  from 05/29/2020.  CODE STATUS:  DNR  PPS: 50%  HOSPICE ELIGIBILITY/DIAGNOSIS: TBD  PHYSICAL EXAM / ROS:  Vital Signs: BP 170/86 (L arm), 178/86 (R arm), P 67, RR 16, 96% on room air General: frail appearing, sitting on couch in her living in NAD Cardiovascular: denied chest pain, +1 edema to lower legs Pulmonary: no cough, no SOB, room air GI: no report of swallowing issues, appetite good, denied constipation, continent of bowel GU: denies dysuria, incontinent of urine MSK:  ambulatory Skin: no rashes or wounds reported Neurological: Weakness, some confusion Psych: non -anxious affect   PAST MEDICAL HISTORY:  Past Medical History:  Diagnosis Date  . Arthritis   . Atypical mole 09/23/2002   Right lower cheek (slight) (widershave)  . Atypical mole 09/23/2002   Left Lower Cheek (slight to moderate) (widershave and excision)  . Atypical mole 12/19/2008   Left Cheek (atypical nevi) (excision)  . BCC (basal cell carcinoma of skin) 03/18/1995   Right Abdomen  . BCC (basal cell carcinoma of skin) 06/12/1999   Upper Right Chest (curet and 5FU)  . Breast cancer ([facility])   . Colon polyps   . Hypertension   . Hypothyroidism   . Osteoporosis   . Rectal bleeding   . SCCA (squamous cell carcinoma) of skin 03/06/2004   Left Shin (curet and 5FU)  . SCCA (squamous cell carcinoma) of skin 10/07/2006   Left Hand (in situ) (curet and 5FU)  . Skin cancer   . Stroke ([facility])   . Superficial basal cell carcinoma (BCC) 05/04/2007   Left Shin (curet and 5FU)    SOCIAL HX:  Social History   Tobacco Use  . Smoking status: Never Smoker  . Smokeless tobacco: Never Used  Substance Use Topics  . Alcohol use: No   FAMILY HX:  Family History  Problem Relation Age of Onset  . Glaucoma Mother   . Coronary artery disease Father   . Diabetes Sister   . Diabetes Brother     ALLERGIES:  Allergies  Allergen Reactions  . Codeine Nausea Only  . Morphine And Related Anxiety and Other (See Comments)    Feel very unwell and out  there  . Sulfa Antibiotics Hives and Itching     PERTINENT MEDICATIONS:  Outpatient Encounter Medications as of 07/05/2020  Medication Sig  . acetaminophen (TYLENOL) 500 MG tablet Take 500 mg by mouth every 6 (six) hours as needed for mild pain or headache.  Marland Kitchen aspirin EC 81 MG EC tablet Take 1 tablet (81 mg total) by mouth daily. (Patient not taking: Reported on 09/26/2019)  . irbesartan (AVAPRO) 300 MG tablet Take 300 mg by mouth every morning.  Marland Kitchen levothyroxine (SYNTHROID, LEVOTHROID) 88 MCG tablet Take 88 mcg by mouth daily before breakfast.  . metoprolol (LOPRESSOR) 50 MG tablet Take 50 mg by mouth 3 (three) times daily.   . pantoprazole (PROTONIX) 20 MG tablet Take 1 tablet (20 mg total) by mouth daily. (Patient not taking: Reported on 09/26/2019)  . PREVALITE 4 g packet Take 4 g by mouth daily.  Marland Kitchen triamcinolone cream (KENALOG) 0.1 % Apply 1 application topically 2 (two) times daily.   No facility-administered encounter medications on file as of 07/05/2020.    Thank you for the opportunity to participate in the care of Ms. Tiffany [name].. The palliative care team will continue to follow. Please call our office at [phone] if we can be of additional assistance.  Jari Favre, DNP, AGPCNP-BC

## 2020-08-11 ENCOUNTER — Other Ambulatory Visit: Payer: Medicare HMO | Admitting: Nurse Practitioner

## 2020-08-11 ENCOUNTER — Other Ambulatory Visit: Payer: Self-pay

## 2020-08-11 DIAGNOSIS — I10 Essential (primary) hypertension: Secondary | ICD-10-CM

## 2020-08-11 DIAGNOSIS — Z515 Encounter for palliative care: Secondary | ICD-10-CM | POA: Diagnosis not present

## 2020-08-11 NOTE — Progress Notes (Addendum)
[facility] Consult Note Telephone: [phone]  Fax: [phone]  PATIENT NAME: Tiffany [name] [address] [place] [address] [place] [address] [phone] (home)  DOB: 06-23-1926 MRN: [id number]  PRIMARY CARE PROVIDER:    Donald Prose, MD,  [address] [place] [address] [place]:   Tiffany [name], [place],  [place] [address] [phone]  RESPONSIBLE PARTY:   Extended Emergency Contact Information Primary Emergency Contact: Tiffany [name] Address: [address]          [place], [place] [address] Tiffany [name] of [place] Phone: [phone] Work Phone: [phone] Mobile Phone: [phone] Relation: Son  NP called Tiffany [name] (son) to update on visit; unable to reach, left voice message with phone number for return call.   CHIEF COMPLAINT: Palliative follow up visit/ high blood pressure   RECOMMENDATIONS/PLAN:    1. Advance Care Planning/Code Status: Patient is a DO NOT RESUSCITATE   2. Goals of Care: Goals of care is comfort while maximizing function..   Palliative care team will continue to support patient, patient's family, and medical team.   3. Symptom management/Plan:  - High blood pressure is ongoing and worsening. Blood pressure today is 186/90 up from 166/82 on recheck at last visit. Patient's current BP medication is Metoprolol Tartrate 50mg  twice a day and Ibesatartan 25mg  daily and Furosemide 40mg  daily. Patient report taking medication as prescribed, her ex-daughter inlaw assist in her medication administration. No evidence of acute distress noted during visit. - Left lower extremity edema is new. No redness, FROM noted to left lower leg. phone call made to patient's PCP office with update on blood pressure reading and new finding of left lower leg extremity at visit today. Awaiting further instructions.    - Dementia without behavioral concern  is stable. Patient pleasantly confused. Continue to encourage participation in facility activities, encourage patient to do for self as much as possible. Patient lives alone in an independent living facility. She has a family friend Tiffany [name] that visits for 2-3 hrs a day on Mondays, Tuesday, Wednesday and Friday, she has a paid caregiver that comes from 10AM-2pm on Thursdays to assit her with bathing. Palliative will continue to monitor for symptom management/decline and make recommendations as indicated. Return 4 weeks or prn. Encouraged to call provider sooner with any concerns.    HISTORY OF PRESENT ILLNESS:Tiffany [name] a 87 y.o.year old femalewith multiple medical problems including high blood pressure, persistent and worsening in the last one month. Condition is not associated with any symptoms, patient however noted with new lower leg extremity edema. Patient denied pain in left leg, she denied chest pain, denied SOB, denied trauma to leg. denied recent fall. Her medical  problems include Dementia without behavior issues(FAST 6b), hx CVA, CKD 3, Hypothyroidism, hx of skin and breast cancer.History obtained from review of [facility] EMR and discussion with patient. All 10 point systems reviewed and are negative except as documented in history of present illness.  Palliative Care was asked to follow this patient by consultation request of Tiffany [name], MDto help address advance care planning, goals of care, and symptoms management. This is a follow up visit 85  from 07/05/2020.  Reviewed lab  Component Ref Range & Units 10 mo ago  (09/25/19) 1 yr ago  (02/02/19) 1 yr ago  (10/30/2018) 2 yr ago  (01/20/18) 2 yr ago  (12/22/17) 2 yr ago  (12/21/17) 4 yr ago  (05/21/16)  Sodium 135 - 145 mmol/L 135  142  142  144  143  145  137   Potassium 3.5 - 5.1 mmol/L 3.8  4.0  3.1Low  4.0  3.6  3.9  4.2   Chloride 98 - 111 mmol/L 102  110  114High  106  116High  111  105 R   CO2 22 - 32 mmol/L 26   23  22  26  24  27  25    Glucose, Bld 70 - 99 mg/dL 119High  105High  111High  137High  104High  156High  182High R   Comment: Glucose reference range applies only to samples taken after fasting for at least 8 hours.  BUN 8 - 23 mg/dL 25High  23  17  19   30High  31High  31High R   Creatinine, Ser 0.44 - 1.00 mg/dL 0.98  1.00  0.99  1.03High  0.93  1.04High  1.07High   Calcium 8.9 - 10.3 mg/dL 9.2  9.2  7.9Low  10.2  8.3Low  9.3  9.1   Total Protein 6.5 - 8.1 g/dL 6.4Low  5.9Low   6.8      Albumin 3.5 - 5.0 g/dL 3.8  3.6   3.9      AST 15 - 41 U/L 23  20   20       ALT 0 - 44 U/L 11  12   9       Alkaline Phosphatase 38 - 126 U/L 87  71   96       Reviewed CT 10/21/2019 IMPRESSION: 1. Chronic ischemic changes, no acute process.  CODE STATUS: DNR  PPS: 50%  HOSPICE ELIGIBILITY/DIAGNOSIS: TBD   PAST MEDICAL HISTORY:  Past Medical History:  Diagnosis Date  . Arthritis   . Atypical mole 09/23/2002   Right lower cheek (slight) (widershave)  . Atypical mole 09/23/2002   Left Lower Cheek (slight to moderate) (widershave and excision)  . Atypical mole 12/19/2008   Left Cheek (atypical nevi) (excision)  . BCC (basal cell carcinoma of skin) 03/18/1995   Right Abdomen  . BCC (basal cell carcinoma of skin) 06/12/1999   Upper Right Chest (curet and 5FU)  . Breast cancer ([facility])   . Colon polyps   . Hypertension   . Hypothyroidism   . Osteoporosis   . Rectal bleeding   . SCCA (squamous cell carcinoma) of skin 03/06/2004   Left Shin (curet and 5FU)  . SCCA (squamous cell carcinoma) of skin 10/07/2006   Left Hand (in situ) (curet and 5FU)  . Skin cancer   . Stroke ([facility])   . Superficial basal cell carcinoma (BCC) 05/04/2007   Left Shin (curet and 5FU)    SOCIAL HX:  Social History   Tobacco Use  . Smoking status: Never Smoker  . Smokeless tobacco: Never Used  Substance Use Topics  . Alcohol use: No   FAMILY HX:  Family History  Problem  Relation Age of Onset  . Glaucoma Mother   . Coronary artery disease Father   . Diabetes Sister   . Diabetes Brother     ALLERGIES:  Allergies  Allergen Reactions  . Codeine Nausea Only  . Morphine And Related Anxiety and Other (See Comments)    Feel very unwell and out there  . Sulfa Antibiotics Hives and Itching     PERTINENT MEDICATIONS:  Outpatient Encounter Medications as of 08/11/2020  Medication Sig  . acetaminophen (TYLENOL) 500 MG tablet Take 500 mg by mouth every 6 (  six) hours as needed for mild pain or headache.  Marland Kitchen aspirin EC 81 MG EC tablet Take 1 tablet (81 mg total) by mouth daily. (Patient not taking: Reported on 09/26/2019)  . irbesartan (AVAPRO) 300 MG tablet Take 300 mg by mouth every morning.  Marland Kitchen levothyroxine (SYNTHROID, LEVOTHROID) 88 MCG tablet Take 88 mcg by mouth daily before breakfast.  . metoprolol (LOPRESSOR) 50 MG tablet Take 50 mg by mouth 3 (three) times daily.   . pantoprazole (PROTONIX) 20 MG tablet Take 1 tablet (20 mg total) by mouth daily. (Patient not taking: Reported on 09/26/2019)  . PREVALITE 4 g packet Take 4 g by mouth daily.  Marland Kitchen triamcinolone cream (KENALOG) 0.1 % Apply 1 application topically 2 (two) times daily.   No facility-administered encounter medications on file as of 08/11/2020.   PHYSICAL EXAM  Vital signs: BP 186/90, P 71,  RR 16, 97% on room air General: In no acute distress,  Cardiovascular: regular rate and rhythm; no edema in BLE Pulmonary: no cough during visit, no increased work of breathing, normal respiratory effort, +2 left lower extremity edema Abdomen: soft, non tender, positive bowel sounds in all quadrants GU:  no suprapubic tenderness Eyes:  no discharge, sclera anicteric ENMT: Moist mucous membranes Musculoskeletal: Weakness, no redness, mild warmth to left lower leg Skin: no rash to visible skin, warm without cyanosis Psych: non-anxious affect Neurological: Weakness, confused, otherwise non focal Heme/lymph/immuno:  no bruises, no bleeding   I spent 40 minutes providing this consultation, time includes time spent with patient, chart review, provider coordination, and documentation. More than 50% of the time in this consultation was spent counseling and coordinating communication.   Thank you for the opportunity to participate in the care of Tiffany [name]. The palliative care team will continue to follow. Please call our office at [phone] if we can be of additional assistance.  Jari Favre, DNP, AGPCNP-BC

## 2020-09-04 DIAGNOSIS — I700 Atherosclerosis of aorta: Secondary | ICD-10-CM | POA: Diagnosis not present

## 2020-09-04 DIAGNOSIS — E039 Hypothyroidism, unspecified: Secondary | ICD-10-CM | POA: Diagnosis not present

## 2020-09-04 DIAGNOSIS — R69 Illness, unspecified: Secondary | ICD-10-CM | POA: Diagnosis not present

## 2020-09-04 DIAGNOSIS — I129 Hypertensive chronic kidney disease with stage 1 through stage 4 chronic kidney disease, or unspecified chronic kidney disease: Secondary | ICD-10-CM | POA: Diagnosis not present

## 2020-09-04 DIAGNOSIS — R600 Localized edema: Secondary | ICD-10-CM | POA: Diagnosis not present

## 2020-09-04 DIAGNOSIS — N1832 Chronic kidney disease, stage 3b: Secondary | ICD-10-CM | POA: Diagnosis not present

## 2020-09-28 ENCOUNTER — Other Ambulatory Visit: Payer: Medicare HMO | Admitting: Nurse Practitioner

## 2020-09-28 ENCOUNTER — Other Ambulatory Visit: Payer: Self-pay

## 2020-09-28 VITALS — BP 162/64 | HR 60 | Resp 16

## 2020-09-28 DIAGNOSIS — Z515 Encounter for palliative care: Secondary | ICD-10-CM

## 2020-09-28 DIAGNOSIS — F0390 Unspecified dementia without behavioral disturbance: Secondary | ICD-10-CM

## 2020-09-28 DIAGNOSIS — R69 Illness, unspecified: Secondary | ICD-10-CM | POA: Diagnosis not present

## 2020-09-28 DIAGNOSIS — I10 Essential (primary) hypertension: Secondary | ICD-10-CM

## 2020-09-28 NOTE — Progress Notes (Signed)
[facility] Consult Note Telephone: [phone]  Fax: [phone]    Date of encounter: 09/28/20 PATIENT NAME: Tiffany [name] [address] [place] [address]   [phone] (home)  DOB: 1926/08/04 MRN: [id number]  PRIMARY CARE PROVIDER:    Donald Prose, MD,  [address] [place] [address] [place]:   Donald Prose, MD [place],  [place] [address] [phone]  RESPONSIBLE PARTY:    Contact Information    Name Relation Home Work Mobile   [facility] [place] Son [phone]     Lattie Haw (ex-daughter in law) [phone] - manges patient's medications  I met face to face with patient in home at [facility] independent living facility. Palliative Care was asked to follow this patient by consultation request of  Donald Prose, MD to address advance care planning and complex medical decision making. This is a follow up visit.                                 ASSESSMENT AND PLAN / RECOMMENDATIONS:   Advance Care Planning/Goals of Care: CODE STATUS: DNR Goal of care: Patient's goal of care is comfort while preserving function. Directives: Signed DNR and MOST forms present in home.  Symptom Management/Plan: Elevated blood pressure: BP today 162/64, P 60. Continue current plan of care with Metoprolol tartrate 57m twice a day, Irbesartan 3031mdaily, and Furosemide 8050maily. Continue to monitor BP daily, notify PCP if SBP consistently greater than 150. Dementia without behavior concerns: Continue supportive care. Maintain safety, prevent falls. Patient lives alone in an independent living facility. She has a family friend Tiffany [name] visits for 2-3 hrs a day on Mondays, Tuesday, Wednesday and Friday, has a paid caregiver that comes from 10AM-2pm on Thursdays to assit her with bathing. Pain: denied pain today. Continue Tylenol 1000m23mice  daily.   Follow up Palliative Care Visit: Palliative care will continue to follow for complex medical decision making, advance care planning, and clarification of goals. Return in about 4-8 weeks or prn.  PPS: 50%  HOSPICE ELIGIBILITY/DIAGNOSIS: TBD  CHIEF COMPLAIN: follow up on elevated blood pressure  History obtained from review of Epic EMR and discussion with Tiffany [name] and her ex-daughter in law.Sports coachISTORY OF PRESENT ILLNESS: Tiffany [name] 89 y71. year old female with multiple medical problems including Dementia (FAST 6b) without behavior issues, HTN, CKD 3, Hypothyroidism, hx of skin and breast cancer., hx of CVA.  Patient was noted to have elevated blood pressures at the last 2 palliative care visits, her SBP was noted to be in the 180s. Patient was also noted with lower extremity edema , left greater than right. Her Lasix dose was increased from 40mg50mly to 80mg 19my. Patient was evaluated by her PCP on 09/03/2020 with no change in blood pressure medication. Patient voiced no concerns today, denied fever, denied chills, denied chest pain or SOB. Ten systems reviewed and are negative for acute change, except as noted in the HPI.   I reviewed available labs, medications, imaging, studies and related documents from the EMR.  Records reviewed and summarized above.   Physical Exam: General: frail appearing, cooperative, sitting on a couch in NAD Cardiovascular: regular rate and rhythm; +1 edema in BLE Pulmonary: no coughduring visit,no increased work of breathing, normal respiratory effort Musculoskeletal:ambulatory Skin: no rash or wound noted  to visible skin Psych: non-anxious affect Neurological: Weakness, mild cognitive impairment, otherwise non focal Heme/lymph/immuno: no bruises, no bleeding  Past Medical History:  Diagnosis Date  . Arthritis   . Atypical mole 09/23/2002   Right lower cheek (slight) (widershave)  . Atypical mole 09/23/2002   Left Lower Cheek  (slight to moderate) (widershave and excision)  . Atypical mole 12/19/2008   Left Cheek (atypical nevi) (excision)  . BCC (basal cell carcinoma of skin) 03/18/1995   Right Abdomen  . BCC (basal cell carcinoma of skin) 06/12/1999   Upper Right Chest (curet and 5FU)  . Breast cancer ([place])   . Colon polyps   . Hypertension   . Hypothyroidism   . Osteoporosis   . Rectal bleeding   . SCCA (squamous cell carcinoma) of skin 03/06/2004   Left Shin (curet and 5FU)  . SCCA (squamous cell carcinoma) of skin 10/07/2006   Left Hand (in situ) (curet and 5FU)  . Skin cancer   . Stroke ([place])   . Superficial basal cell carcinoma (BCC) 05/04/2007   Left Shin (curet and 5FU)   Thank you for the opportunity to participate in the care of Tiffany [name].  The palliative care team will continue to follow. Please call our office at [phone] if we can be of additional assistance.   Jari Favre, DNP, AGPCNP-BC  COVID-19 PATIENT SCREENING TOOL Asked and negative response unless otherwise noted:   Have you had symptoms of covid, tested positive or been in contact with someone with symptoms/positive test in the past 5-10 days?

## 2020-10-01 ENCOUNTER — Emergency Department (HOSPITAL_COMMUNITY): Payer: Medicare HMO

## 2020-10-01 ENCOUNTER — Inpatient Hospital Stay (HOSPITAL_COMMUNITY)
Admission: EM | Admit: 2020-10-01 | Discharge: 2020-10-05 | DRG: 695 | Disposition: A | Discharge status: Skilled Nursing Facility | Payer: Medicare HMO | Attending: Internal Medicine | Admitting: Internal Medicine

## 2020-10-01 DIAGNOSIS — R8271 Bacteriuria: Secondary | ICD-10-CM | POA: Diagnosis not present

## 2020-10-01 DIAGNOSIS — W19XXXA Unspecified fall, initial encounter: Secondary | ICD-10-CM

## 2020-10-01 DIAGNOSIS — N179 Acute kidney failure, unspecified: Secondary | ICD-10-CM | POA: Diagnosis not present

## 2020-10-01 DIAGNOSIS — N390 Urinary tract infection, site not specified: Secondary | ICD-10-CM | POA: Diagnosis present

## 2020-10-01 DIAGNOSIS — Z853 Personal history of malignant neoplasm of breast: Secondary | ICD-10-CM

## 2020-10-01 DIAGNOSIS — Z90710 Acquired absence of both cervix and uterus: Secondary | ICD-10-CM

## 2020-10-01 DIAGNOSIS — E039 Hypothyroidism, unspecified: Secondary | ICD-10-CM | POA: Diagnosis present

## 2020-10-01 DIAGNOSIS — M545 Low back pain, unspecified: Secondary | ICD-10-CM | POA: Diagnosis not present

## 2020-10-01 DIAGNOSIS — M79652 Pain in left thigh: Secondary | ICD-10-CM | POA: Diagnosis present

## 2020-10-01 DIAGNOSIS — I6782 Cerebral ischemia: Secondary | ICD-10-CM | POA: Diagnosis not present

## 2020-10-01 DIAGNOSIS — N3000 Acute cystitis without hematuria: Secondary | ICD-10-CM | POA: Diagnosis not present

## 2020-10-01 DIAGNOSIS — N1831 Chronic kidney disease, stage 3a: Secondary | ICD-10-CM | POA: Diagnosis present

## 2020-10-01 DIAGNOSIS — Z9013 Acquired absence of bilateral breasts and nipples: Secondary | ICD-10-CM

## 2020-10-01 DIAGNOSIS — E875 Hyperkalemia: Secondary | ICD-10-CM | POA: Diagnosis not present

## 2020-10-01 DIAGNOSIS — W010XXA Fall on same level from slipping, tripping and stumbling without subsequent striking against object, initial encounter: Secondary | ICD-10-CM | POA: Diagnosis present

## 2020-10-01 DIAGNOSIS — Z87440 Personal history of urinary (tract) infections: Secondary | ICD-10-CM

## 2020-10-01 DIAGNOSIS — I700 Atherosclerosis of aorta: Secondary | ICD-10-CM | POA: Diagnosis not present

## 2020-10-01 DIAGNOSIS — Z83511 Family history of glaucoma: Secondary | ICD-10-CM

## 2020-10-01 DIAGNOSIS — Z66 Do not resuscitate: Secondary | ICD-10-CM | POA: Diagnosis present

## 2020-10-01 DIAGNOSIS — Z885 Allergy status to narcotic agent status: Secondary | ICD-10-CM

## 2020-10-01 DIAGNOSIS — D539 Nutritional anemia, unspecified: Secondary | ICD-10-CM | POA: Diagnosis present

## 2020-10-01 DIAGNOSIS — S199XXA Unspecified injury of neck, initial encounter: Secondary | ICD-10-CM | POA: Diagnosis not present

## 2020-10-01 DIAGNOSIS — Z8719 Personal history of other diseases of the digestive system: Secondary | ICD-10-CM

## 2020-10-01 DIAGNOSIS — Z20822 Contact with and (suspected) exposure to covid-19: Secondary | ICD-10-CM | POA: Diagnosis present

## 2020-10-01 DIAGNOSIS — B9620 Unspecified Escherichia coli [E. coli] as the cause of diseases classified elsewhere: Secondary | ICD-10-CM | POA: Diagnosis present

## 2020-10-01 DIAGNOSIS — Z743 Need for continuous supervision: Secondary | ICD-10-CM | POA: Diagnosis not present

## 2020-10-01 DIAGNOSIS — E876 Hypokalemia: Secondary | ICD-10-CM | POA: Diagnosis not present

## 2020-10-01 DIAGNOSIS — Z96642 Presence of left artificial hip joint: Secondary | ICD-10-CM | POA: Diagnosis present

## 2020-10-01 DIAGNOSIS — I10 Essential (primary) hypertension: Secondary | ICD-10-CM | POA: Diagnosis present

## 2020-10-01 DIAGNOSIS — Z79890 Hormone replacement therapy (postmenopausal): Secondary | ICD-10-CM

## 2020-10-01 DIAGNOSIS — R296 Repeated falls: Secondary | ICD-10-CM

## 2020-10-01 DIAGNOSIS — Z882 Allergy status to sulfonamides status: Secondary | ICD-10-CM

## 2020-10-01 DIAGNOSIS — E860 Dehydration: Secondary | ICD-10-CM | POA: Diagnosis present

## 2020-10-01 DIAGNOSIS — Z8249 Family history of ischemic heart disease and other diseases of the circulatory system: Secondary | ICD-10-CM

## 2020-10-01 DIAGNOSIS — J986 Disorders of diaphragm: Secondary | ICD-10-CM | POA: Diagnosis not present

## 2020-10-01 DIAGNOSIS — B961 Klebsiella pneumoniae [K. pneumoniae] as the cause of diseases classified elsewhere: Secondary | ICD-10-CM | POA: Diagnosis present

## 2020-10-01 DIAGNOSIS — M47812 Spondylosis without myelopathy or radiculopathy, cervical region: Secondary | ICD-10-CM | POA: Diagnosis not present

## 2020-10-01 DIAGNOSIS — Z79899 Other long term (current) drug therapy: Secondary | ICD-10-CM

## 2020-10-01 DIAGNOSIS — Y9301 Activity, walking, marching and hiking: Secondary | ICD-10-CM | POA: Diagnosis present

## 2020-10-01 DIAGNOSIS — Z8673 Personal history of transient ischemic attack (TIA), and cerebral infarction without residual deficits: Secondary | ICD-10-CM

## 2020-10-01 DIAGNOSIS — S0990XA Unspecified injury of head, initial encounter: Secondary | ICD-10-CM | POA: Diagnosis not present

## 2020-10-01 DIAGNOSIS — R509 Fever, unspecified: Secondary | ICD-10-CM | POA: Diagnosis not present

## 2020-10-01 DIAGNOSIS — M1990 Unspecified osteoarthritis, unspecified site: Secondary | ICD-10-CM | POA: Diagnosis present

## 2020-10-01 DIAGNOSIS — F0390 Unspecified dementia without behavioral disturbance: Secondary | ICD-10-CM | POA: Diagnosis present

## 2020-10-01 DIAGNOSIS — Z833 Family history of diabetes mellitus: Secondary | ICD-10-CM

## 2020-10-01 DIAGNOSIS — I129 Hypertensive chronic kidney disease with stage 1 through stage 4 chronic kidney disease, or unspecified chronic kidney disease: Secondary | ICD-10-CM | POA: Diagnosis present

## 2020-10-01 DIAGNOSIS — M25552 Pain in left hip: Secondary | ICD-10-CM | POA: Diagnosis not present

## 2020-10-01 DIAGNOSIS — Y92031 Bathroom in apartment as the place of occurrence of the external cause: Secondary | ICD-10-CM

## 2020-10-01 DIAGNOSIS — G9341 Metabolic encephalopathy: Secondary | ICD-10-CM | POA: Diagnosis present

## 2020-10-01 DIAGNOSIS — R0781 Pleurodynia: Secondary | ICD-10-CM | POA: Diagnosis present

## 2020-10-01 DIAGNOSIS — Z7982 Long term (current) use of aspirin: Secondary | ICD-10-CM

## 2020-10-01 DIAGNOSIS — R531 Weakness: Secondary | ICD-10-CM | POA: Diagnosis present

## 2020-10-01 DIAGNOSIS — Z85828 Personal history of other malignant neoplasm of skin: Secondary | ICD-10-CM

## 2020-10-01 LAB — BASIC METABOLIC PANEL
Anion gap: 10 (ref 5–15)
BUN: 35 mg/dL — ABNORMAL HIGH (ref 8–23)
CO2: 28 mmol/L (ref 22–32)
Calcium: 9.6 mg/dL (ref 8.9–10.3)
Chloride: 107 mmol/L (ref 98–111)
Creatinine, Ser: 1.4 mg/dL — ABNORMAL HIGH (ref 0.44–1.00)
GFR, Estimated: 36 mL/min — ABNORMAL LOW (ref >60)
Glucose, Bld: 112 mg/dL — ABNORMAL HIGH (ref 70–99)
Potassium: 4 mmol/L (ref 3.5–5.1)
Sodium: 145 mmol/L (ref 135–145)

## 2020-10-01 LAB — CBC WITH DIFFERENTIAL/PLATELET
Abs Immature Granulocytes: 0.05 10*3/uL (ref 0.00–0.07)
Basophils Absolute: 0 10*3/uL (ref 0.0–0.1)
Basophils Relative: 1 %
Eosinophils Absolute: 0.1 10*3/uL (ref 0.0–0.5)
Eosinophils Relative: 1 %
HCT: 37.8 % (ref 36.0–46.0)
Hemoglobin: 12 g/dL (ref 12.0–15.0)
Immature Granulocytes: 1 %
Lymphocytes Relative: 10 %
Lymphs Abs: 0.8 10*3/uL (ref 0.7–4.0)
MCH: 31.7 pg (ref 26.0–34.0)
MCHC: 31.7 g/dL (ref 30.0–36.0)
MCV: 100 fL (ref 80.0–100.0)
Monocytes Absolute: 0.7 10*3/uL (ref 0.1–1.0)
Monocytes Relative: 9 %
Neutro Abs: 6.7 10*3/uL (ref 1.7–7.7)
Neutrophils Relative %: 78 %
Platelets: 176 10*3/uL (ref 150–400)
RBC: 3.78 MIL/uL — ABNORMAL LOW (ref 3.87–5.11)
RDW: 12.4 % (ref 11.5–15.5)
WBC: 8.4 10*3/uL (ref 4.0–10.5)
nRBC: 0 % (ref 0.0–0.2)

## 2020-10-01 MED ORDER — ACETAMINOPHEN 325 MG PO TABS
650.0000 mg | ORAL_TABLET | Freq: Once | ORAL | Status: AC
  Administered 2020-10-02: 650 mg via ORAL
  Filled 2020-10-01: qty 2

## 2020-10-01 MED ORDER — FENTANYL CITRATE (PF) 100 MCG/2ML IJ SOLN
25.0000 ug | Freq: Once | INTRAMUSCULAR | Status: AC | PRN
  Administered 2020-10-01: 25 ug via INTRAVENOUS
  Filled 2020-10-01: qty 2

## 2020-10-01 MED ORDER — ONDANSETRON HCL 4 MG/2ML IJ SOLN
4.0000 mg | Freq: Once | INTRAMUSCULAR | Status: AC | PRN
Start: 2020-10-01 — End: 2020-10-01
  Administered 2020-10-01: 4 mg via INTRAVENOUS
  Filled 2020-10-01: qty 2

## 2020-10-01 NOTE — ED Triage Notes (Signed)
Pt came in from [facility] with c/o fall. Pt has pain to her L hip. Pt is shortened and rotated. According to family, pt had prior hip replacement on that hip. Pt did hit her head, No LOC, no blood thinners

## 2020-10-01 NOTE — ED Notes (Signed)
Patient transported to CT 

## 2020-10-01 NOTE — ED Provider Notes (Signed)
[facility] DEPT Provider Note   CSN: [id number] Arrival date & time: 10/01/20  2039     History Chief Complaint  Patient presents with  . Fall  . Hip Pain    Tiffany [name] is a 87 y.o. female.w PMHx CVA, basal cell carcinoma, CKD, HTN, total left hip arthroplasty, presenting from independent living facility after mechanical fall today. Patient states she tripped and fell, she did hit her head without LOC. No associated HA, N/V, vision changes, CP, abd pain. Mostly having proximal left thigh pain, left posterior rib pain. Reports hx of left hip replacement. Not on anticoagulation. Denies numbness to lower extremity. Denies wounds.  Patient's son arrived later in workup and provides additional hx: He states early this morning patient fell while try to use the restroom.  She was able to call for help with the pull cord in the restroom.  She was found to have urinated on the floor during that time.  Family spent time with her later this afternoon and she was complaining of pain in the left hip.  She had another fall walking across the room to get her nighttime medications.  Patient's son states he does not believe she is taking well enough care of herself and probably needs to be transferred to an assisted living facility.  This progression has been gradual.  She has been acting her mental baseline today.  He states she has had UTIs in the past though nothing frequent.  No recent illnesses.  He states she is unable to go home with him because he cares for his autistic son by himself  The history is provided by the patient (Son).       Past Medical History:  Diagnosis Date  . Arthritis   . Atypical mole 09/23/2002   Right lower cheek (slight) (widershave)  . Atypical mole 09/23/2002   Left Lower Cheek (slight to moderate) (widershave and excision)  . Atypical mole 12/19/2008   Left Cheek (atypical nevi) (excision)  . BCC (basal cell carcinoma of skin)  03/18/1995   Right Abdomen  . BCC (basal cell carcinoma of skin) 06/12/1999   Upper Right Chest (curet and 5FU)  . Breast cancer ([place] [place])   . Colon polyps   . Hypertension   . Hypothyroidism   . Osteoporosis   . Rectal bleeding   . SCCA (squamous cell carcinoma) of skin 03/06/2004   Left Shin (curet and 5FU)  . SCCA (squamous cell carcinoma) of skin 10/07/2006   Left Hand (in situ) (curet and 5FU)  . Skin cancer   . Stroke ([facility])   . Superficial basal cell carcinoma (BCC) 05/04/2007   Left Shin (curet and 5FU)    Patient Active Problem List   Diagnosis Date Noted  . Cerebral embolism with cerebral infarction 12/23/2017  . Pressure injury of skin 12/22/2017  . Fall 12/21/2017  . Generalized weakness 12/21/2017  . Rhabdomyolysis 12/21/2017  . CKD (chronic kidney disease) stage 3, GFR 30-59 ml/min (HCC) 12/21/2017  . Hypothyroidism 12/21/2017  . Palpitations 12/17/2012  . Internal bleeding hemorrhoids 10/26/2012  . Rectal bleeding 10/04/2012  . HTN (hypertension) 10/04/2012    Past Surgical History:  Procedure Laterality Date  . ABDOMINAL HYSTERECTOMY    . BILATERAL TOTAL MASTECTOMY WITH AXILLARY LYMPH NODE DISSECTION    . CATARACT EXTRACTION    . CHOLECYSTECTOMY    . COLONOSCOPY N/A 10/07/2012   Procedure: COLONOSCOPY;  Surgeon: Arta Silence, MD;  Location: [facility] ENDOSCOPY;  Service: Endoscopy;  Laterality:  N/A;  . hip replacment    . HIP SURGERY    . JOINT REPLACEMENT    . MASTECTOMY    . REPLACEMENT TOTAL KNEE    . shin repair       OB History   No obstetric history on file.     Family History  Problem Relation Age of Onset  . Glaucoma Mother   . Coronary artery disease Father   . Diabetes Sister   . Diabetes Brother     Social History   Tobacco Use  . Smoking status: Never Smoker  . Smokeless tobacco: Never Used  Substance Use Topics  . Alcohol use: No  . Drug use: No    Home Medications Prior to Admission medications   Medication Sig Start Date  End Date Taking? Authorizing Provider  acetaminophen (TYLENOL) 500 MG tablet Take 500 mg by mouth every 6 (six) hours as needed for mild pain or headache.    [provider]  aspirin EC 81 MG EC tablet Take 1 tablet (81 mg total) by mouth daily. Patient not taking: Reported on 09/26/2019 12/27/17   Shelly Coss, MD  irbesartan (AVAPRO) 300 MG tablet Take 300 mg by mouth every morning.    [provider]  levothyroxine (SYNTHROID, LEVOTHROID) 88 MCG tablet Take 88 mcg by mouth daily before breakfast.    [provider]  metoprolol (LOPRESSOR) 50 MG tablet Take 50 mg by mouth 3 (three) times daily.     [provider]  pantoprazole (PROTONIX) 20 MG tablet Take 1 tablet (20 mg total) by mouth daily. Patient not taking: Reported on 09/26/2019 11/02/2018   Charlesetta Shanks, MD  PREVALITE 4 g packet Take 4 g by mouth daily. 01/29/19   [provider]  triamcinolone cream (KENALOG) 0.1 % Apply 1 application topically 2 (two) times daily. 09/09/19   Lavonna Monarch, MD    Allergies    Codeine, Morphine and related, and Sulfa antibiotics  Review of Systems   Review of Systems  All other systems reviewed and are negative.   Physical Exam Updated Vital Signs BP (!) 182/63   Pulse 69   Temp 98.7 F (37.1 C) (Oral)   Resp 20   SpO2 98%   Physical Exam Vitals and nursing note reviewed.  Constitutional:      Appearance: She is well-developed.  HENT:     Head: Normocephalic and atraumatic.     Comments: No palpable hematoma.  No wounds.  No tenderness Eyes:     Conjunctiva/sclera: Conjunctivae normal.  Cardiovascular:     Rate and Rhythm: Normal rate and regular rhythm.  Pulmonary:     Effort: Pulmonary effort is normal. No respiratory distress.     Breath sounds: Normal breath sounds.  Abdominal:     General: Bowel sounds are normal.     Palpations: Abdomen is soft.     Tenderness: There is no abdominal tenderness.  Musculoskeletal:     Cervical  back: Normal range of motion and neck supple. No tenderness.     Comments: TTP to left lateral proximal thigh.  No skin changes, bruising, wounds.  Pelvis is stable.  Knee is nontender and appears atraumatic.  Left leg appears shortened though without rotation.  Son states this is baseline for her, has had multiple surgeries to the left hip and has multiple inch shortening of the left leg at baseline. No midline spinal tenderness, no bony step-offs wrist deformities.  There is some tenderness to left posterior lateral  lower ribs.  Skin:    General: Skin is warm.  Neurological:     Mental Status: She is alert.     Comments: Cranial nerves grossly intact.  Equal strength bilateral upper extremities.  Normal distal sensation to light touch to all 4 extremities.  Normal DP pulses  Psychiatric:        Mood and Affect: Mood normal.        Behavior: Behavior normal.     ED Results / Procedures / Treatments   Labs (all labs ordered are listed, but only abnormal results are displayed) Labs Reviewed  CBC WITH DIFFERENTIAL/PLATELET - Abnormal; Notable for the following components:      Result Value   RBC 3.78 (*)    All other components within normal limits  URINALYSIS, ROUTINE W REFLEX MICROSCOPIC  BASIC METABOLIC PANEL    EKG None  Radiology DG Ribs Unilateral W/Chest Left  Result Date: 10/01/2020 CLINICAL DATA:  Left-sided rib pain following fall, initial encounter EXAM: LEFT RIBS AND CHEST - 3+ VIEW COMPARISON:  09/26/2019 FINDINGS: Cardiac shadow is at the upper limits of normal in size. Aortic calcifications are noted. The lungs are well aerated bilaterally. Chronic elevation of left hemidiaphragm is seen. No pneumothorax is noted. No acute rib fracture is noted. IMPRESSION: No acute rib fracture is seen. Electronically Signed   By: Inez Catalina M.D.   On: 10/01/2020 21:46   CT Head Wo Contrast  Result Date: 10/01/2020 CLINICAL DATA:  [place], hit head EXAM: CT HEAD WITHOUT CONTRAST  TECHNIQUE: Contiguous axial images were obtained from the base of the skull through the vertex without intravenous contrast. COMPARISON:  10/21/2019 FINDINGS: Brain: Stable chronic ischemic changes are seen within the right temporoparietal region and throughout the periventricular white matter. No signs of acute infarct or hemorrhage. The lateral ventricles and midline structures are unremarkable. No acute extra-axial fluid collections. No mass effect. Vascular: No hyperdense vessel or unexpected calcification. Skull: Normal. Negative for fracture or focal lesion. Sinuses/Orbits: No acute finding. Other: None. IMPRESSION: 1. No acute intracranial process.  Stable chronic ischemic change. Electronically Signed   By: Randa Ngo M.D.   On: 10/01/2020 22:02   CT Cervical Spine Wo Contrast  Result Date: 10/01/2020 CLINICAL DATA:  [place], hit head EXAM: CT CERVICAL SPINE WITHOUT CONTRAST TECHNIQUE: Multidetector CT imaging of the cervical spine was performed without intravenous contrast. Multiplanar CT image reconstructions were also generated. COMPARISON:  10/21/2019 FINDINGS: Alignment: Alignment is grossly anatomic. Skull base and vertebrae: No acute fracture. No primary bone lesion or focal pathologic process. Soft tissues and spinal canal: No prevertebral fluid or swelling. No visible canal hematoma. Disc levels: Stable diffuse spondylosis and facet hypertrophy throughout the entirety of the cervical spine. Upper chest: Airway is patent.  Lung apices are clear. Other: Reconstructed images demonstrate no additional findings. IMPRESSION: 1. No acute cervical spine fracture. 2. Stable multilevel cervical spondylosis and facet hypertrophy. Electronically Signed   By: Randa Ngo M.D.   On: 10/01/2020 22:05   DG Hip Unilat With Pelvis 2-3 Views Left  Result Date: 10/01/2020 CLINICAL DATA:  Left hip pain following fall, initial encounter EXAM: DG HIP (WITH OR WITHOUT PELVIS) 3V LEFT COMPARISON:  09/26/2019,  12/21/2017 FINDINGS: Left hip replacement is again seen. Pelvic ring is intact. No acute fracture is seen. Degenerative changes of the lumbar spine are noted. IMPRESSION: Status post left hip replacement.  No acute abnormality is noted. Electronically Signed   By: Inez Catalina M.D.   On:  10/01/2020 21:45    Procedures Procedures   Medications Ordered in ED Medications  fentaNYL (SUBLIMAZE) injection 25 mcg (25 mcg Intravenous Given 10/01/20 2217)  ondansetron (ZOFRAN) injection 4 mg (4 mg Intravenous Given 10/01/20 2217)    ED Course  I have reviewed the triage vital signs and the nursing notes.  Pertinent labs & imaging results that were available during my care of the patient were reviewed by me and considered in my medical decision making (see chart for details).    MDM Rules/Calculators/A&P                          Patient is a 87 year old female with presentation as above in HPI.  Patient's son states he believes she needs to be in assisted living, her current facility does not have assisted living capability.  Son is also unable to care for her at home, he has an autistic son that he provides care for.  No other family local.  She has care plan form at bedside, comfort measures only.   CT imaging and plain films are negative for acute injury today.  Blood work reveals AKI 1.4, suspected to be due to poor p.o. intake per patient's son's report.  We will ambulate patient.  Pending UA and disposition, care handed off at shift change to [employer].  Final Clinical Impression(s) / ED Diagnoses Final diagnoses:  None    Rx / DC Orders ED Discharge Orders    None       Misha Antonini, [place] N, PA-C 10/02/20 0013    Arnaldo Natal, MD 10/02/20 [phone]

## 2020-10-02 ENCOUNTER — Other Ambulatory Visit: Payer: Self-pay

## 2020-10-02 DIAGNOSIS — N390 Urinary tract infection, site not specified: Secondary | ICD-10-CM | POA: Diagnosis present

## 2020-10-02 DIAGNOSIS — R296 Repeated falls: Secondary | ICD-10-CM

## 2020-10-02 DIAGNOSIS — N179 Acute kidney failure, unspecified: Secondary | ICD-10-CM

## 2020-10-02 HISTORY — DX: Urinary tract infection, site not specified: N39.0

## 2020-10-02 HISTORY — DX: Acute kidney failure, unspecified: N17.9

## 2020-10-02 LAB — BASIC METABOLIC PANEL
Anion gap: 5 (ref 5–15)
BUN: 28 mg/dL — ABNORMAL HIGH (ref 8–23)
CO2: 29 mmol/L (ref 22–32)
Calcium: 8.8 mg/dL — ABNORMAL LOW (ref 8.9–10.3)
Chloride: 110 mmol/L (ref 98–111)
Creatinine, Ser: 1.23 mg/dL — ABNORMAL HIGH (ref 0.44–1.00)
GFR, Estimated: 41 mL/min — ABNORMAL LOW (ref >60)
Glucose, Bld: 130 mg/dL — ABNORMAL HIGH (ref 70–99)
Potassium: 3.6 mmol/L (ref 3.5–5.1)
Sodium: 144 mmol/L (ref 135–145)

## 2020-10-02 LAB — URINALYSIS, ROUTINE W REFLEX MICROSCOPIC
Bilirubin Urine: NEGATIVE
Glucose, UA: 50 mg/dL — AB
Hgb urine dipstick: NEGATIVE
Ketones, ur: 5 mg/dL — AB
Nitrite: POSITIVE — AB
Protein, ur: NEGATIVE mg/dL
Specific Gravity, Urine: 1.017 (ref 1.005–1.030)
WBC, UA: >50 WBC/hpf — ABNORMAL HIGH (ref 0–5)
pH: 6 (ref 5.0–8.0)

## 2020-10-02 LAB — RESP PANEL BY RT-PCR (FLU A&B, COVID) ARPGX2
Influenza A by PCR: NEGATIVE
Influenza B by PCR: NEGATIVE
SARS Coronavirus 2 by RT PCR: NEGATIVE

## 2020-10-02 LAB — VITAMIN B12: Vitamin B-12: 83 pg/mL — ABNORMAL LOW (ref 180–914)

## 2020-10-02 LAB — AMMONIA: Ammonia: 14 umol/L (ref 9–35)

## 2020-10-02 LAB — TSH: TSH: 0.318 u[IU]/mL — ABNORMAL LOW (ref 0.350–4.500)

## 2020-10-02 MED ORDER — SODIUM CHLORIDE 0.9 % IV SOLN
1.0000 g | Freq: Once | INTRAVENOUS | Status: AC
  Administered 2020-10-02: 1 g via INTRAVENOUS
  Filled 2020-10-02: qty 10

## 2020-10-02 MED ORDER — METOPROLOL TARTRATE 50 MG PO TABS: 50.0000 mg | ORAL_TABLET | Freq: Two times a day (BID) | ORAL | Status: DC

## 2020-10-02 MED ORDER — SODIUM CHLORIDE 0.9 % IV SOLN: 1.0000 g | INTRAVENOUS | Status: DC

## 2020-10-02 MED ORDER — MEMANTINE HCL 10 MG PO TABS
10.0000 mg | ORAL_TABLET | Freq: Every day | ORAL | Status: DC
  Administered 2020-10-02 – 2020-10-04 (×3): 10 mg via ORAL
  Filled 2020-10-02 (×3): qty 1

## 2020-10-02 MED ORDER — ACETAMINOPHEN 650 MG RE SUPP: 650.0000 mg | Freq: Four times a day (QID) | RECTAL | Status: DC | PRN

## 2020-10-02 MED ORDER — OXYCODONE HCL 5 MG PO TABS
5.0000 mg | ORAL_TABLET | Freq: Four times a day (QID) | ORAL | Status: DC | PRN
  Administered 2020-10-02 – 2020-10-04 (×4): 5 mg via ORAL
  Filled 2020-10-02 (×6): qty 1

## 2020-10-02 MED ORDER — SODIUM CHLORIDE 0.9 % IV SOLN: INTRAVENOUS | Status: DC

## 2020-10-02 MED ORDER — METOPROLOL TARTRATE 50 MG PO TABS
50.0000 mg | ORAL_TABLET | Freq: Two times a day (BID) | ORAL | Status: DC
  Administered 2020-10-02 – 2020-10-05 (×6): 50 mg via ORAL
  Filled 2020-10-02 (×6): qty 1

## 2020-10-02 MED ORDER — SODIUM CHLORIDE 0.9 % IV SOLN
INTRAVENOUS | Status: DC
  Administered 2020-10-02: 75 mL via INTRAVENOUS

## 2020-10-02 MED ORDER — ACETAMINOPHEN 500 MG PO TABS: 1000.0000 mg | ORAL_TABLET | Freq: Three times a day (TID) | ORAL | Status: DC | PRN

## 2020-10-02 MED ORDER — ENOXAPARIN SODIUM 30 MG/0.3ML IJ SOSY
30.0000 mg | PREFILLED_SYRINGE | INTRAMUSCULAR | Status: DC
  Administered 2020-10-02 – 2020-10-05 (×4): 30 mg via SUBCUTANEOUS
  Filled 2020-10-02 (×4): qty 0.3

## 2020-10-02 MED ORDER — LEVOTHYROXINE SODIUM 88 MCG PO TABS
88.0000 ug | ORAL_TABLET | Freq: Every day | ORAL | Status: DC
  Administered 2020-10-03 – 2020-10-05 (×3): 88 ug via ORAL
  Filled 2020-10-02 (×3): qty 1

## 2020-10-02 MED ORDER — SERTRALINE HCL 25 MG PO TABS
25.0000 mg | ORAL_TABLET | Freq: Every day | ORAL | Status: DC
  Administered 2020-10-03 – 2020-10-05 (×3): 25 mg via ORAL
  Filled 2020-10-02 (×3): qty 1

## 2020-10-02 MED ORDER — ACETAMINOPHEN 325 MG PO TABS
650.0000 mg | ORAL_TABLET | Freq: Four times a day (QID) | ORAL | Status: DC | PRN
  Administered 2020-10-02 – 2020-10-04 (×2): 650 mg via ORAL
  Filled 2020-10-02 (×2): qty 2

## 2020-10-02 NOTE — H&P (Addendum)
History and Physical    Tiffany [name] KNL:[id number] DOB: 05-25-1926 DOA: 10/01/2020  PCP: Donald Prose, MD Patient coming from: Independent living facility  Chief Complaint: Falls  HPI: Tiffany [name] is a 87 y.o. female with medical history significant of dementia, hypertension, CKD stage IIIa, hypothyroidism, history of skin and breast cancer, history of CVA presenting to the ED from her independent living facility as she had 2 mechanical falls today and complained of left hip pain and left-sided rib pain.  Patient's son was concerned about increasing confusion as she had an episode where she urinated on the floor and told him that she was driving one of her sweaters.  Son was concerned about her ability to care for herself as she has become increasingly weak. Patient's son is not able to care for her at home as he is caring for his autistic son by himself.    In the ED, patient was not febrile or tachycardic.  Labs showing WBC 8.4, hemoglobin 12.0, platelet count 176K.  Sodium 145, potassium 4.0, chloride 107, bicarb 28, BUN 35, creatinine 1.4 (baseline 0.9), glucose 112.  UA showing positive nitrite, large amount of leukocytes, and greater than 50 WBCs.  Urine culture pending.  COVID and influenza PCR negative.  Chest x-ray not suggestive of pneumonia.  X-rays negative for hip or rib fracture.  Lumbar x-ray negative for acute finding.  CT head/C-spine negative for acute finding.  Patient had a MOST form with her with stated that the patient did not want IV fluids or antibiotics.  However, patient's son told ED PA that he would like the patient's AKI and urinalysis to be treated with fluids and antibiotics.  Per ED PA Mia McDonald's conversation with the patient's son: "Per the MOST form, it is currently signed by the patient's son that the patient does not want to receive IV fluids or antibiotics.  I reviewed the selections with the patient's son.  He states that when the representative from  [facility] brought the from that it was already prefilled and just required his signature.  When discussing the patient's work-up today, the patient's son is adamant that he would like her AKI and urinalysis treated today with fluids and antibiotics.  I recommended reviewing and possibly updating MOST form in the near future."  Patient was given Tylenol, ceftriaxone, fentanyl, and Zofran.  Patient reports 2 falls at her independent living facility since yesterday.  States yesterday she went to use the bathroom and was wearing socks.  She thinks she slipped on the bathroom floor and fell and hit her head.  Denies loss of consciousness.  Today while walking in her apartment she fell again which she thinks was due to her slipping as she was walking on hardwood floors wearing socks.  Denies loss of consciousness.  She is having some slight discomfort in her left hip.  For both the falls, she denies preceding dizziness, palpitations, chest pain, or shortness of breath.  No other complaints.  Denies fevers, cough, shortness of breath, nausea, vomiting, abdominal pain, diarrhea, or dysuria.  States her appetite is "very good."  Review of Systems:  All systems reviewed and apart from history of presenting illness, are negative.  Past Medical History:  Diagnosis Date  . Arthritis   . Atypical mole 09/23/2002   Right lower cheek (slight) (widershave)  . Atypical mole 09/23/2002   Left Lower Cheek (slight to moderate) (widershave and excision)  . Atypical mole 12/19/2008   Left Cheek (atypical nevi) (excision)  .  BCC (basal cell carcinoma of skin) 03/18/1995   Right Abdomen  . BCC (basal cell carcinoma of skin) 06/12/1999   Upper Right Chest (curet and 5FU)  . Breast cancer ([place])   . Colon polyps   . Hypertension   . Hypothyroidism   . Osteoporosis   . Rectal bleeding   . SCCA (squamous cell carcinoma) of skin 03/06/2004   Left Shin (curet and 5FU)  . SCCA (squamous cell carcinoma) of skin  10/07/2006   Left Hand (in situ) (curet and 5FU)  . Skin cancer   . Stroke ([place])   . Superficial basal cell carcinoma (BCC) 05/04/2007   Left Shin (curet and 5FU)    Past Surgical History:  Procedure Laterality Date  . ABDOMINAL HYSTERECTOMY    . BILATERAL TOTAL MASTECTOMY WITH AXILLARY LYMPH NODE DISSECTION    . CATARACT EXTRACTION    . CHOLECYSTECTOMY    . COLONOSCOPY N/A 10/07/2012   Procedure: COLONOSCOPY;  Surgeon: Arta Silence, MD;  Location: [facility] ENDOSCOPY;  Service: Endoscopy;  Laterality: N/A;  . hip replacment    . HIP SURGERY    . JOINT REPLACEMENT    . MASTECTOMY    . REPLACEMENT TOTAL KNEE    . shin repair       reports that she has never smoked. She has never used smokeless tobacco. She reports that she does not drink alcohol and does not use drugs.  Allergies  Allergen Reactions  . Codeine Nausea Only  . Morphine And Related Anxiety and Other (See Comments)    Feel very unwell and out there  . Sulfa Antibiotics Hives and Itching    Family History  Problem Relation Age of Onset  . Glaucoma Mother   . Coronary artery disease Father   . Diabetes Sister   . Diabetes Brother     Prior to Admission medications   Medication Sig Start Date End Date Taking? Authorizing Provider  acetaminophen (TYLENOL) 500 MG tablet Take 500 mg by mouth every 6 (six) hours as needed for mild pain or headache.    [provider]  aspirin EC 81 MG EC tablet Take 1 tablet (81 mg total) by mouth daily. Patient not taking: Reported on 09/26/2019 12/27/17   Shelly Coss, MD  irbesartan (AVAPRO) 300 MG tablet Take 300 mg by mouth every morning.    [provider]  levothyroxine (SYNTHROID, LEVOTHROID) 88 MCG tablet Take 88 mcg by mouth daily before breakfast.    [provider]  metoprolol (LOPRESSOR) 50 MG tablet Take 50 mg by mouth 3 (three) times daily.     [provider]  pantoprazole (PROTONIX) 20 MG tablet Take 1 tablet (20 mg total) by mouth  daily. Patient not taking: Reported on 09/26/2019 10/18/2018   Charlesetta Shanks, MD  PREVALITE 4 g packet Take 4 g by mouth daily. 01/29/19   [provider]  triamcinolone cream (KENALOG) 0.1 % Apply 1 application topically 2 (two) times daily. 09/09/19   Lavonna Monarch, MD    Physical Exam: Vitals:   10/02/20 0030 10/02/20 0130 10/02/20 0245 10/02/20 0345  BP: (!) 167/51 (!) 149/53 (!) 153/51 (!) 158/54  Pulse: (!) 59 79 65 66  Resp: 16 14 18 14   Temp:      TempSrc:      SpO2: 100% 100% 99% 100%    Physical Exam Constitutional:      General: She is not in acute distress. HENT:     Head: Normocephalic and atraumatic.  Mouth/Throat:     Mouth: Mucous membranes are dry.  Eyes:     Extraocular Movements: Extraocular movements intact.     Pupils: Pupils are equal, round, and reactive to light.  Cardiovascular:     Rate and Rhythm: Normal rate and regular rhythm.     Pulses: Normal pulses.  Pulmonary:     Effort: Pulmonary effort is normal. No respiratory distress.  Abdominal:     General: Bowel sounds are normal. There is no distension.     Palpations: Abdomen is soft.     Tenderness: There is no abdominal tenderness.  Musculoskeletal:        General: No swelling or tenderness.     Cervical back: Normal range of motion and neck supple.  Skin:    General: Skin is warm and dry.  Neurological:     General: No focal deficit present.     Mental Status: She is alert.     Cranial Nerves: No cranial nerve deficit.     Sensory: No sensory deficit.     Motor: No weakness.     Comments: Oriented to person and place Partially oriented to time (does not know the year but knows that the month is May)     Labs on Admission: I have personally reviewed following labs and imaging studies  CBC: Recent Labs  Lab 10/01/20 2210  WBC 8.4  NEUTROABS 6.7  HGB 12.0  HCT 37.8  MCV 100.0  PLT 829   Basic Metabolic Panel: Recent Labs  Lab 10/01/20 2210  NA 145  K 4.0  CL 107   CO2 28  GLUCOSE 112*  BUN 35*  CREATININE 1.40*  CALCIUM 9.6   GFR: CrCl cannot be calculated (Unknown ideal weight.). Liver Function Tests: No results for input(s): AST, ALT, ALKPHOS, BILITOT, PROT, ALBUMIN in the last 168 hours. No results for input(s): LIPASE, AMYLASE in the last 168 hours. No results for input(s): AMMONIA in the last 168 hours. Coagulation Profile: No results for input(s): INR, PROTIME in the last 168 hours. Cardiac Enzymes: No results for input(s): CKTOTAL, CKMB, CKMBINDEX, TROPONINI in the last 168 hours. BNP (last 3 results) No results for input(s): PROBNP in the last 8760 hours. HbA1C: No results for input(s): HGBA1C in the last 72 hours. CBG: No results for input(s): GLUCAP in the last 168 hours. Lipid Profile: No results for input(s): CHOL, HDL, LDLCALC, TRIG, CHOLHDL, LDLDIRECT in the last 72 hours. Thyroid Function Tests: No results for input(s): TSH, T4TOTAL, FREET4, T3FREE, THYROIDAB in the last 72 hours. Anemia Panel: No results for input(s): VITAMINB12, FOLATE, FERRITIN, TIBC, IRON, RETICCTPCT in the last 72 hours. Urine analysis:    Component Value Date/Time   COLORURINE YELLOW 10/01/2020 2231   APPEARANCEUR CLOUDY (A) 10/01/2020 2231   LABSPEC 1.017 10/01/2020 2231   PHURINE 6.0 10/01/2020 2231   GLUCOSEU 50 (A) 10/01/2020 2231   HGBUR NEGATIVE 10/01/2020 2231   BILIRUBINUR NEGATIVE 10/01/2020 2231   KETONESUR 5 (A) 10/01/2020 2231   PROTEINUR NEGATIVE 10/01/2020 2231   UROBILINOGEN 0.2 02/09/2015 0158   NITRITE POSITIVE (A) 10/01/2020 2231   LEUKOCYTESUR LARGE (A) 10/01/2020 2231    Radiological Exams on Admission: DG Ribs Unilateral W/Chest Left  Result Date: 10/01/2020 CLINICAL DATA:  Left-sided rib pain following fall, initial encounter EXAM: LEFT RIBS AND CHEST - 3+ VIEW COMPARISON:  09/26/2019 FINDINGS: Cardiac shadow is at the upper limits of normal in size. Aortic calcifications are noted. The lungs are well aerated  bilaterally. Chronic elevation  of left hemidiaphragm is seen. No pneumothorax is noted. No acute rib fracture is noted. IMPRESSION: No acute rib fracture is seen. Electronically Signed   By: Inez Catalina M.D.   On: 10/01/2020 21:46   DG Lumbar Spine Complete  Result Date: 10/01/2020 CLINICAL DATA:  [place], left hip pain EXAM: LUMBAR SPINE - COMPLETE 4+ VIEW COMPARISON:  12/21/2017 FINDINGS: Frontal, bilateral oblique, and lateral views of the lumbar spine are obtained. Five non-rib-bearing lumbar type vertebral bodies are again identified with severe right convex scoliosis centered at the thoracolumbar junction, stable. Bones are diffusely osteopenic. Stable L1 compression deformity. Stable degenerative changes surrounding the L3/L4 disc space, with invagination of the inferior L3 endplate and superior L4 endplate. Diffuse multilevel spondylosis and facet hypertrophy, greatest at the thoracolumbar junction, grossly unchanged. Diffuse vascular calcifications are again noted. IMPRESSION: 1. Stable appearance of the lumbar spine with multilevel degenerative changes and chronic compression deformities as above. No acute bony abnormality. Electronically Signed   By: Randa Ngo M.D.   On: 10/01/2020 23:09   CT Head Wo Contrast  Result Date: 10/01/2020 CLINICAL DATA:  [place], hit head EXAM: CT HEAD WITHOUT CONTRAST TECHNIQUE: Contiguous axial images were obtained from the base of the skull through the vertex without intravenous contrast. COMPARISON:  10/21/2019 FINDINGS: Brain: Stable chronic ischemic changes are seen within the right temporoparietal region and throughout the periventricular white matter. No signs of acute infarct or hemorrhage. The lateral ventricles and midline structures are unremarkable. No acute extra-axial fluid collections. No mass effect. Vascular: No hyperdense vessel or unexpected calcification. Skull: Normal. Negative for fracture or focal lesion. Sinuses/Orbits: No acute finding. Other:  None. IMPRESSION: 1. No acute intracranial process.  Stable chronic ischemic change. Electronically Signed   By: Randa Ngo M.D.   On: 10/01/2020 22:02   CT Cervical Spine Wo Contrast  Result Date: 10/01/2020 CLINICAL DATA:  [place], hit head EXAM: CT CERVICAL SPINE WITHOUT CONTRAST TECHNIQUE: Multidetector CT imaging of the cervical spine was performed without intravenous contrast. Multiplanar CT image reconstructions were also generated. COMPARISON:  10/21/2019 FINDINGS: Alignment: Alignment is grossly anatomic. Skull base and vertebrae: No acute fracture. No primary bone lesion or focal pathologic process. Soft tissues and spinal canal: No prevertebral fluid or swelling. No visible canal hematoma. Disc levels: Stable diffuse spondylosis and facet hypertrophy throughout the entirety of the cervical spine. Upper chest: Airway is patent.  Lung apices are clear. Other: Reconstructed images demonstrate no additional findings. IMPRESSION: 1. No acute cervical spine fracture. 2. Stable multilevel cervical spondylosis and facet hypertrophy. Electronically Signed   By: Randa Ngo M.D.   On: 10/01/2020 22:05   DG Hip Unilat With Pelvis 2-3 Views Left  Result Date: 10/01/2020 CLINICAL DATA:  Left hip pain following fall, initial encounter EXAM: DG HIP (WITH OR WITHOUT PELVIS) 3V LEFT COMPARISON:  09/26/2019, 12/21/2017 FINDINGS: Left hip replacement is again seen. Pelvic ring is intact. No acute fracture is seen. Degenerative changes of the lumbar spine are noted. IMPRESSION: Status post left hip replacement.  No acute abnormality is noted. Electronically Signed   By: Inez Catalina M.D.   On: 10/01/2020 21:45    Assessment/Plan Principal Problem:   UTI (urinary tract infection) Active Problems:   HTN (hypertension)   Hypothyroidism   AKI (acute kidney injury) ([place])   Frequent falls   UTI UA showing positive nitrite, large amount of leukocytes, and greater than 50 WBCs.  No fever, leukocytosis, or  signs of sepsis. -Continue ceftriaxone.  Urine culture  pending.  AKI on CKD stage IIIa Likely prerenal from dehydration.  BUN 35, creatinine 1.4 (baseline 0.9). -IV fluid hydration.  Monitor renal function and urine output.  Avoid nephrotoxic agents.  Acute metabolic encephalopathy Likely due to UTI and dehydration.  Head CT negative for acute finding.  No focal neurodeficit.  Does not appear confused at present and answering questions appropriately. -Continue IV fluid and antibiotic.  Check TSH, B12, and ammonia levels.  Frequent falls -PT/OT eval, fall precautions.  Check orthostatics.  TOC consulted requesting help with placement.  Dementia -Follow delirium precautions  Hypertension Stable. -Hold antihypertensives at this time, checking orthostatics  Hypothyroidism -Resume Synthroid after pharmacy med rec is done  DVT prophylaxis: Lovenox Code Status: DNR as stated in palliative care note from 09/28/2020. I am not able to find her MOST form at this time.  Patient tells me that it was in the room earlier but her son took care it away with him.  She confirms that she is DNR. Family Communication: No family available at this time. Disposition Plan: Status is: Observation  The patient remains OBS appropriate and will d/c before 2 midnights.  Dispo: The patient is from: Independent living facility              Anticipated d/c is to: SNF              Patient currently is not medically stable to d/c.   Difficult to place patient No  Level of care: Level of care: Med-Surg   The medical decision making on this patient was of high complexity and the patient is at high risk for clinical deterioration, therefore this is a level 3 visit.  Shela Leff MD [facility]  If 7PM-7AM, please contact night-coverage [email or web address]  10/02/2020, 4:07 AM

## 2020-10-02 NOTE — Progress Notes (Addendum)
PROGRESS NOTE    Tiffany [name]  FOY:[id number] DOB: October 13, 1926 DOA: 10/01/2020 PCP: Donald Prose, MD   Brief Narrative:  HPI: Tiffany [name] is a 87 y.o. female with medical history significant of dementia, hypertension, CKD stage IIIa, hypothyroidism, history of skin and breast cancer, history of CVA presenting to the ED from her independent living facility as she had 2 mechanical falls today and complained of left hip pain and left-sided rib pain.  Patient's son was concerned about increasing confusion as she had an episode where she urinated on the floor and told him that she was driving one of her sweaters.  Son was concerned about her ability to care for herself as she has become increasingly weak. Patient's son is not able to care for her at home as he is caring for his autistic son by himself.    In the ED, patient was not febrile or tachycardic.  Labs showing WBC 8.4, hemoglobin 12.0, platelet count 176K.  Sodium 145, potassium 4.0, chloride 107, bicarb 28, BUN 35, creatinine 1.4 (baseline 0.9), glucose 112.  UA showing positive nitrite, large amount of leukocytes, and greater than 50 WBCs.  Urine culture pending.  COVID and influenza PCR negative.  Chest x-ray not suggestive of pneumonia.  X-rays negative for hip or rib fracture.  Lumbar x-ray negative for acute finding.  CT head/C-spine negative for acute finding.  Patient had a MOST form with her with stated that the patient did not want IV fluids or antibiotics.  However, patient's son told ED PA that he would like the patient's AKI and urinalysis to be treated with fluids and antibiotics.  Per ED PA Mia McDonald's conversation with the patient's son: "Per the MOST form, it is currently signed by the patient's son that the patient does not want to receiveIV fluids or antibiotics. I reviewed the selections with the patient's son. He states that when the representative from [facility] already prefilled  and just required his signature.When discussing the patient's work-up today, the patient's son is adamant that he would like her AKI and urinalysis treated today with fluids and antibiotics. I recommended reviewing and possibly updating MOST form in the near future."  Patient was given Tylenol, ceftriaxone, fentanyl, and Zofran.  Patient reports 2 falls at her independent living facility since yesterday.  States yesterday she went to use the bathroom and was wearing socks.  She thinks she slipped on the bathroom floor and fell and hit her head.  Denies loss of consciousness.  Today while walking in her apartment she fell again which she thinks was due to her slipping as she was walking on hardwood floors wearing socks.  Denies loss of consciousness.  She is having some slight discomfort in her left hip.  For both the falls, she denies preceding dizziness, palpitations, chest pain, or shortness of breath.  No other complaints.  Denies fevers, cough, shortness of breath, nausea, vomiting, abdominal pain, diarrhea, or dysuria.  States her appetite is "very good."  Assessment & Plan:   Principal Problem:   UTI (urinary tract infection) Active Problems:   HTN (hypertension)   Hypothyroidism   AKI (acute kidney injury) ([place])   Frequent falls   UTI/bacteriuria: Patient denies any urinary complaints.  She has no leukocytosis or fever.  No suprapubic tenderness.  Discussed with the son, I learned that patient has progressively worsening and severe dementia and she cannot even operate the TV remote control and time does not mean anything to her.  She  tends to forget most of the things.  Based on this information, slight worsening confusion yesterday could be just delirium.  I will discontinue antibiotics.  AKI on CKD stage IIIa/dehydration: Slight improvement in renal function today.  We will continue IV fluids for another day as patient tends to forget drinking water.  Acute metabolic  encephalopathy: Likely delirium yesterday.  She is alert and partially oriented, at her baseline.  Frequent falls/debility: Seen by OT, they recommend SNF.  PT assessment pending but she will likely require either SNF or long-term care.  She lives in assisted living facility and she is not safe to go back and live by herself.  Son agrees with that.  I have alerted TOC to communicate with the family.  Dementia -Follow delirium precautions.  Resume home medications.  Hypertension: Slightly elevated, resume metoprolol but hold Avapro due to AKI.  Hypothyroidism: Continue Synthroid.  DVT prophylaxis: enoxaparin (LOVENOX) injection 30 mg Start: 10/02/20 1000   Code Status: DNR  Family Communication:  None present at bedside.  Discussed with her son over the phone.  Status is: Observation  The patient will require care spanning > 2 midnights and should be moved to inpatient because: Unsafe d/c plan  Dispo: The patient is from: Home              Anticipated d/c is to: SNF              Patient currently is not medically stable to d/c.   Difficult to place patient No        Estimated body mass index is 23.17 kg/m as calculated from the following:   Height as of this encounter: 5\' 7"  (1.702 m).   Weight as of this encounter: 67.1 kg.  Pressure Injury 12/22/17 Deep Tissue Injury - Purple or maroon localized area of discolored intact skin or blood-filled blister due to damage of underlying soft tissue from pressure and/or shear. (Active)  12/22/17 1850  Location: Buttocks  Location Orientation:   Staging: Deep Tissue Injury - Purple or maroon localized area of discolored intact skin or blood-filled blister due to damage of underlying soft tissue from pressure and/or shear.  Wound Description (Comments):   Present on Admission: Yes     Nutritional status:               Consultants:   None  Procedures:   None  Antimicrobials:  Anti-infectives (From admission,  onward)   Start     Dose/Rate Route Frequency Ordered Stop   10/03/20 0200  cefTRIAXone (ROCEPHIN) 1 g in sodium chloride 0.9 % 100 mL IVPB  Status:  Discontinued        1 g 200 mL/hr over 30 Minutes Intravenous Every 24 hours 10/02/20 0403 10/02/20 1354   10/02/20 0200  cefTRIAXone (ROCEPHIN) 1 g in sodium chloride 0.9 % 100 mL IVPB        1 g 200 mL/hr over 30 Minutes Intravenous  Once 10/02/20 0145 10/02/20 0436         Subjective: Seen and examined.  She is alert and partly oriented.  Denies any complaint.  She knows that she is in the hospital.  She also remembers that she fell yesterday twice.  Objective: Vitals:   10/02/20 0345 10/02/20 0510 10/02/20 0538 10/02/20 0935  BP: (!) 158/54 (!) 163/53  (!) 162/60  Pulse: 66 72  70  Resp: 14 16  18   Temp:  98.5 F (36.9 C)  98.2 F (36.8 C)  TempSrc:  Oral  Oral  SpO2: 100% 98%  98%  Weight:   67.1 kg   Height:   5\' 7"  (1.702 m)     Intake/Output Summary (Last 24 hours) at 10/02/2020 1355 Last data filed at 10/02/2020 0540 Gross per 24 hour  Intake 152.63 ml  Output --  Net 152.63 ml   Filed Weights   10/02/20 0538  Weight: 67.1 kg    Examination:  General exam: Appears calm and comfortable  Respiratory system: Clear to auscultation. Respiratory effort normal. Cardiovascular system: S1 & S2 heard, RRR. No JVD, murmurs, rubs, gallops or clicks. No pedal edema. Gastrointestinal system: Abdomen is nondistended, soft and nontender. No organomegaly or masses felt. Normal bowel sounds heard. Central nervous system: Alert and oriented x2. No focal neurological deficits. Extremities: Symmetric 5 x 5 power. Skin: No rashes, lesions or ulcers Psychiatry: Judgement and insight appear poor   Data Reviewed: I have personally reviewed following labs and imaging studies  CBC: Recent Labs  Lab 10/01/20 2210  WBC 8.4  NEUTROABS 6.7  HGB 12.0  HCT 37.8  MCV 100.0  PLT 341   Basic Metabolic Panel: Recent Labs  Lab  10/01/20 2210 10/02/20 0811  NA 145 144  K 4.0 3.6  CL 107 110  CO2 28 29  GLUCOSE 112* 130*  BUN 35* 28*  CREATININE 1.40* 1.23*  CALCIUM 9.6 8.8*   GFR: Estimated Creatinine Clearance: 29 mL/min (A) (by C-G formula based on SCr of 1.23 mg/dL (H)). Liver Function Tests: No results for input(s): AST, ALT, ALKPHOS, BILITOT, PROT, ALBUMIN in the last 168 hours. No results for input(s): LIPASE, AMYLASE in the last 168 hours. Recent Labs  Lab 10/02/20 0811  AMMONIA 14   Coagulation Profile: No results for input(s): INR, PROTIME in the last 168 hours. Cardiac Enzymes: No results for input(s): CKTOTAL, CKMB, CKMBINDEX, TROPONINI in the last 168 hours. BNP (last 3 results) No results for input(s): PROBNP in the last 8760 hours. HbA1C: No results for input(s): HGBA1C in the last 72 hours. CBG: No results for input(s): GLUCAP in the last 168 hours. Lipid Profile: No results for input(s): CHOL, HDL, LDLCALC, TRIG, CHOLHDL, LDLDIRECT in the last 72 hours. Thyroid Function Tests: Recent Labs    10/02/20 0811  TSH 0.318*   Anemia Panel: Recent Labs    10/02/20 0811  VITAMINB12 83*   Sepsis Labs: No results for input(s): PROCALCITON, LATICACIDVEN in the last 168 hours.  Recent Results (from the past 240 hour(s))  Resp Panel by RT-PCR (Flu A&B, Covid) Nasopharyngeal Swab     Status: None   Collection Time: 10/02/20  2:27 AM   Specimen: Nasopharyngeal Swab; Nasopharyngeal(NP) swabs in vial transport medium  Result Value Ref Range Status   SARS Coronavirus 2 by RT PCR NEGATIVE NEGATIVE Final    Comment: (NOTE) SARS-CoV-2 target nucleic acids are NOT DETECTED.  The SARS-CoV-2 RNA is generally detectable in upper respiratory specimens during the acute phase of infection. The lowest concentration of SARS-CoV-2 viral copies this assay can detect is 138 copies/mL. A negative result does not preclude SARS-Cov-2 infection and should not be used as the sole basis for treatment  or other patient management decisions. A negative result may occur with  improper specimen collection/handling, submission of specimen other than nasopharyngeal swab, presence of viral mutation(s) within the areas targeted by this assay, and inadequate number of viral copies(<138 copies/mL). A negative result must be combined with clinical observations, patient history, and epidemiological information. The expected  result is Negative.  Fact Sheet for Patients:  EntrepreneurPulse.com.au  Fact Sheet for Healthcare Providers:  IncredibleEmployment.be  This test is no t yet approved or cleared by the [place] FDA and  has been authorized for detection and/or diagnosis of SARS-CoV-2 by FDA under an Emergency Use Authorization (EUA). This EUA will remain  in effect (meaning this test can be used) for the duration of the COVID-19 declaration under Section 564(b)(1) of the Act, 21 U.S.C.section 360bbb-3(b)(1), unless the authorization is terminated  or revoked sooner.       Influenza A by PCR NEGATIVE NEGATIVE Final   Influenza B by PCR NEGATIVE NEGATIVE Final    Comment: (NOTE) The Xpert Xpress SARS-CoV-2/FLU/RSV plus assay is intended as an aid in the diagnosis of influenza from Nasopharyngeal swab specimens and should not be used as a sole basis for treatment. Nasal washings and aspirates are unacceptable for Xpert Xpress SARS-CoV-2/FLU/RSV testing.  Fact Sheet for Patients: EntrepreneurPulse.com.au  Fact Sheet for Healthcare Providers: IncredibleEmployment.be  This test is not yet approved or cleared by the [place] FDA and has been authorized for detection and/or diagnosis of SARS-CoV-2 by FDA under an Emergency Use Authorization (EUA). This EUA will remain in effect (meaning this test can be used) for the duration of the COVID-19 declaration under Section 564(b)(1) of the Act, 21 U.S.C. section  360bbb-3(b)(1), unless the authorization is terminated or revoked.  Performed at [facility], [place] [address], [place], [place] [address]       Radiology Studies: DG Ribs Unilateral W/Chest Left  Result Date: 10/01/2020 CLINICAL DATA:  Left-sided rib pain following fall, initial encounter EXAM: LEFT RIBS AND CHEST - 3+ VIEW COMPARISON:  09/26/2019 FINDINGS: Cardiac shadow is at the upper limits of normal in size. Aortic calcifications are noted. The lungs are well aerated bilaterally. Chronic elevation of left hemidiaphragm is seen. No pneumothorax is noted. No acute rib fracture is noted. IMPRESSION: No acute rib fracture is seen. Electronically Signed   By: Inez Catalina M.D.   On: 10/01/2020 21:46   DG Lumbar Spine Complete  Result Date: 10/01/2020 CLINICAL DATA:  [place], left hip pain EXAM: LUMBAR SPINE - COMPLETE 4+ VIEW COMPARISON:  12/21/2017 FINDINGS: Frontal, bilateral oblique, and lateral views of the lumbar spine are obtained. Five non-rib-bearing lumbar type vertebral bodies are again identified with severe right convex scoliosis centered at the thoracolumbar junction, stable. Bones are diffusely osteopenic. Stable L1 compression deformity. Stable degenerative changes surrounding the L3/L4 disc space, with invagination of the inferior L3 endplate and superior L4 endplate. Diffuse multilevel spondylosis and facet hypertrophy, greatest at the thoracolumbar junction, grossly unchanged. Diffuse vascular calcifications are again noted. IMPRESSION: 1. Stable appearance of the lumbar spine with multilevel degenerative changes and chronic compression deformities as above. No acute bony abnormality. Electronically Signed   By: Randa Ngo M.D.   On: 10/01/2020 23:09   CT Head Wo Contrast  Result Date: 10/01/2020 CLINICAL DATA:  [place], hit head EXAM: CT HEAD WITHOUT CONTRAST TECHNIQUE: Contiguous axial images were obtained from the base of the skull through the vertex without  intravenous contrast. COMPARISON:  10/21/2019 FINDINGS: Brain: Stable chronic ischemic changes are seen within the right temporoparietal region and throughout the periventricular white matter. No signs of acute infarct or hemorrhage. The lateral ventricles and midline structures are unremarkable. No acute extra-axial fluid collections. No mass effect. Vascular: No hyperdense vessel or unexpected calcification. Skull: Normal. Negative for fracture or focal lesion. Sinuses/Orbits: No acute finding. Other:  None. IMPRESSION: 1. No acute intracranial process.  Stable chronic ischemic change. Electronically Signed   By: Randa Ngo M.D.   On: 10/01/2020 22:02   CT Cervical Spine Wo Contrast  Result Date: 10/01/2020 CLINICAL DATA:  [place], hit head EXAM: CT CERVICAL SPINE WITHOUT CONTRAST TECHNIQUE: Multidetector CT imaging of the cervical spine was performed without intravenous contrast. Multiplanar CT image reconstructions were also generated. COMPARISON:  10/21/2019 FINDINGS: Alignment: Alignment is grossly anatomic. Skull base and vertebrae: No acute fracture. No primary bone lesion or focal pathologic process. Soft tissues and spinal canal: No prevertebral fluid or swelling. No visible canal hematoma. Disc levels: Stable diffuse spondylosis and facet hypertrophy throughout the entirety of the cervical spine. Upper chest: Airway is patent.  Lung apices are clear. Other: Reconstructed images demonstrate no additional findings. IMPRESSION: 1. No acute cervical spine fracture. 2. Stable multilevel cervical spondylosis and facet hypertrophy. Electronically Signed   By: Randa Ngo M.D.   On: 10/01/2020 22:05   DG Hip Unilat With Pelvis 2-3 Views Left  Result Date: 10/01/2020 CLINICAL DATA:  Left hip pain following fall, initial encounter EXAM: DG HIP (WITH OR WITHOUT PELVIS) 3V LEFT COMPARISON:  09/26/2019, 12/21/2017 FINDINGS: Left hip replacement is again seen. Pelvic ring is intact. No acute fracture is  seen. Degenerative changes of the lumbar spine are noted. IMPRESSION: Status post left hip replacement.  No acute abnormality is noted. Electronically Signed   By: Inez Catalina M.D.   On: 10/01/2020 21:45    Scheduled Meds: . enoxaparin (LOVENOX) injection  30 mg Subcutaneous Q24H  . [START ON 10/03/2020] levothyroxine  88 mcg Oral QAC breakfast  . memantine  10 mg Oral QHS  . metoprolol tartrate  50 mg Oral BID  . sertraline  25 mg Oral Daily   Continuous Infusions:   LOS: 0 days   Time spent: 35 minutes   Darliss Cheney, MD [facility]  10/02/2020, 1:55 PM   How to contact the [facility] Attending or Consulting provider [place] or covering provider during after hours [place], for this patient?  1. Check the care team in [facility] and look for a) attending/consulting [facility] provider listed and b) the [facility] team listed. Page or secure chat [phone]. 2. Log into [email or web address] and use [place]'s universal password to access. If you do not have the password, please contact the hospital operator. 3. Locate the [facility] provider you are looking for under [facility] and page to a number that you can be directly reached. 4. If you still have difficulty reaching the provider, please page the [facility] (Director on Call) for the Hospitalists listed on amion for assistance.

## 2020-10-02 NOTE — ED Provider Notes (Signed)
87 year old female received at signout from [facility] pending UA. Per her HPI:   "Tiffany [name] is a 87 y.o. female.w PMHx CVA, basal cell carcinoma, CKD, HTN, total left hip arthroplasty, presenting from independent living facility after mechanical fall today. Patient states she tripped and fell, she did hit her head without LOC. No associated HA, N/V, vision changes, CP, abd pain. Mostly having proximal left thigh pain, left posterior rib pain. Reports hx of left hip replacement. Not on anticoagulation. Denies numbness to lower extremity. Denies wounds.  Patient's son arrived later in workup and provides additional hx: He states early this morning patient fell while try to use the restroom.  She was able to call for help with the pull cord in the restroom.  She was found to have urinated on the floor during that time.  Family spent time with her later this afternoon and she was complaining of pain in the left hip.  She had another fall walking across the room to get her nighttime medications.  Patient's son states he does not believe she is taking well enough care of herself and probably needs to be transferred to an assisted living facility.  This progression has been gradual.  She has been acting her mental baseline today.  He states she has had UTIs in the past though nothing frequent.  No recent illnesses.  He states she is unable to go home with him because he cares for his autistic son by himself  The history is provided by the patient (Son)."   Physical Exam  BP (!) 149/53   Pulse 79   Temp 98.7 F (37.1 C) (Oral)   Resp 14   SpO2 100%   Physical Exam Vitals and nursing note reviewed.  Constitutional:      Appearance: She is well-developed.     Comments: Pleasant, elderly female. NAD.   HENT:     Head: Normocephalic and atraumatic.  Cardiovascular:     Rate and Rhythm: Normal rate.     Pulses: Normal pulses.  Pulmonary:     Effort: Pulmonary effort is normal.  Abdominal:      General: There is no distension.  Musculoskeletal:        General: Normal range of motion.     Cervical back: Normal range of motion.  Neurological:     Mental Status: She is alert.    ED Course/Procedures     Procedures  MDM  87 year old female received in signout from [facility] pending urinalysis.  Please see her note for further work-up and medical decision making.  In brief, this is a patient who had 2 falls earlier today.  She also had an episode where she was found to have urinated on the floor.  The patient currently lives in an independent living facility, but her son is concerned about increasing confusion.  He states that recently she called him and told him that she was driving one of her sweaters.  When he asked how, she stated that she had turned on the oven and open it and had the sweater hanging over the open oven door.  He is concerned about her ability to care for herself and she has become increasingly weak.  Vital signs are stable.  Urinalysis has been reviewed and inability to by me.  UA looks concerning for infection.  We will send urine culture.  Updated the patient's son, her POA, at bedside about labs and urinalysis results.  I reviewed the patient's DNR form  and MOST form.  Per the MOST form, it is currently signed by the patient's son that the patient does not want to receive IV fluids or antibiotics.  I reviewed the selections with the patient's son.  He states that when the representative from [facility] brought the from that it was already prefilled and just required his signature.  When discussing the patient's work-up today, the patient's son is adamant that he would like her AKI and urinalysis treated today with fluids and antibiotics.  I recommended reviewing and possibly updating MOST form in the near future.  Normal saline bolus and Rocephin has been ordered.  Urine culture is pending.  Consult to the hospitalist team and Dr. Jeannine Kitten accept the  patient for admission.       Joline Maxcy A, PA-C 10/02/20 0210    Tiffany Etienne, DO 10/02/20 [id number]

## 2020-10-02 NOTE — ED Notes (Signed)
ED TO INPATIENT HANDOFF REPORT  Name/Age/Gender Tiffany [name] 87 y.o. female  Code Status    Code Status Orders  (From admission, onward)         Start     Ordered   10/02/20 0403  Do not attempt resuscitation (DNR)  Continuous       Question Answer Comment  In the event of cardiac or respiratory ARREST Do not call a "code blue"   In the event of cardiac or respiratory ARREST Do not perform Intubation, CPR, defibrillation or ACLS   In the event of cardiac or respiratory ARREST Use medication by any route, position, wound care, and other measures to relive pain and suffering. May use oxygen, suction and manual treatment of airway obstruction as needed for comfort.      10/02/20 0403        Code Status History    Date Active Date Inactive Code Status Order ID Comments User Context   12/21/2017 1807 12/26/2017 1931 DNR [id number]  Dessa Phi, DO Inpatient   10/03/2012 2357 10/08/2012 1647 Full Code [id number]  Theressa Millard, MD ED   Advance Care Planning Activity    Advance Directive Documentation   Flowsheet Row Most Recent Value  Type of Advance Directive Out of facility DNR (pink MOST or yellow form)  Pre-existing out of facility DNR order (yellow form or pink MOST form) Pink MOST/Yellow Form most recent copy in chart - Physician notified to receive inpatient order  "MOST" Form in Place? --      Home/SNF/Other Nursing Home  Chief Complaint UTI (urinary tract infection) [N39.0]  Level of Care/Admitting Diagnosis ED Disposition    ED Disposition Condition [place]: [place] [[id number]]  Level of Care: Med-Surg [16]  Covid Evaluation: Asymptomatic Screening Protocol (No Symptoms)  Diagnosis: UTI (urinary tract infection) [950932]  Admitting Physician: Shela Leff [[id number]]  Attending Physician: Shela Leff [[id number]]       Medical History Past Medical History:  Diagnosis Date  . Arthritis   .  Atypical mole 09/23/2002   Right lower cheek (slight) (widershave)  . Atypical mole 09/23/2002   Left Lower Cheek (slight to moderate) (widershave and excision)  . Atypical mole 12/19/2008   Left Cheek (atypical nevi) (excision)  . BCC (basal cell carcinoma of skin) 03/18/1995   Right Abdomen  . BCC (basal cell carcinoma of skin) 06/12/1999   Upper Right Chest (curet and 5FU)  . Breast cancer ([place])   . Colon polyps   . Hypertension   . Hypothyroidism   . Osteoporosis   . Rectal bleeding   . SCCA (squamous cell carcinoma) of skin 03/06/2004   Left Shin (curet and 5FU)  . SCCA (squamous cell carcinoma) of skin 10/07/2006   Left Hand (in situ) (curet and 5FU)  . Skin cancer   . Stroke ([place])   . Superficial basal cell carcinoma (BCC) 05/04/2007   Left Shin (curet and 5FU)    Allergies Allergies  Allergen Reactions  . Codeine Nausea Only  . Morphine And Related Anxiety and Other (See Comments)    Feel very unwell and out there  . Sulfa Antibiotics Hives and Itching    IV Location/Drains/Wounds Patient Lines/Drains/Airways Status    Active Line/Drains/Airways    Name Placement date Placement time Site Days   Peripheral IV 10/01/20 20 G Anterior;Proximal;Right Forearm 10/01/20  2210  Forearm  1   Pressure Injury 12/22/17 Deep Tissue Injury - Purple or maroon localized  area of discolored intact skin or blood-filled blister due to damage of underlying soft tissue from pressure and/or shear. 12/22/17  1850  -- 1015   Wound / Incision (Open or Dehisced) Elbow Posterior;Right --  --  Elbow  --   Wound / Incision (Open or Dehisced) 12/22/17 Elbow Left;Posterior 12/22/17  1851  Elbow  1015          Labs/Imaging Results for orders placed or performed during the hospital encounter of 10/01/20 (from the past 48 hour(s))  Basic metabolic panel     Status: Abnormal   Collection Time: 10/01/20 10:10 PM  Result Value Ref Range   Sodium 145 135 - 145 mmol/L   Potassium 4.0 3.5 - 5.1  mmol/L   Chloride 107 98 - 111 mmol/L   CO2 28 22 - 32 mmol/L   Glucose, Bld 112 (H) 70 - 99 mg/dL    Comment: Glucose reference range applies only to samples taken after fasting for at least 8 hours.   BUN 35 (H) 8 - 23 mg/dL   Creatinine, Ser 1.40 (H) 0.44 - 1.00 mg/dL   Calcium 9.6 8.9 - 10.3 mg/dL   GFR, Estimated 36 (L) >60 mL/min    Comment: (NOTE) Calculated using the CKD-EPI Creatinine Equation (2021)    Anion gap 10 5 - 15    Comment: Performed at [facility], [place] [address], [place], [place] [address]  CBC with Differential     Status: Abnormal   Collection Time: 10/01/20 10:10 PM  Result Value Ref Range   WBC 8.4 4.0 - 10.5 K/uL   RBC 3.78 (L) 3.87 - 5.11 MIL/uL   Hemoglobin 12.0 12.0 - 15.0 g/dL   HCT 37.8 36.0 - 46.0 %   MCV 100.0 80.0 - 100.0 fL   MCH 31.7 26.0 - 34.0 pg   MCHC 31.7 30.0 - 36.0 g/dL   RDW 12.4 11.5 - 15.5 %   Platelets 176 150 - 400 K/uL   nRBC 0.0 0.0 - 0.2 %   Neutrophils Relative % 78 %   Neutro Abs 6.7 1.7 - 7.7 K/uL   Lymphocytes Relative 10 %   Lymphs Abs 0.8 0.7 - 4.0 K/uL   Monocytes Relative 9 %   Monocytes Absolute 0.7 0.1 - 1.0 K/uL   Eosinophils Relative 1 %   Eosinophils Absolute 0.1 0.0 - 0.5 K/uL   Basophils Relative 1 %   Basophils Absolute 0.0 0.0 - 0.1 K/uL   Immature Granulocytes 1 %   Abs Immature Granulocytes 0.05 0.00 - 0.07 K/uL    Comment: Performed at [facility], [place] [address], [place], [place] [address]  Urinalysis, Routine w reflex microscopic Urine, Clean Catch     Status: Abnormal   Collection Time: 10/01/20 10:31 PM  Result Value Ref Range   Color, Urine YELLOW YELLOW   APPearance CLOUDY (A) CLEAR   Specific Gravity, Urine 1.017 1.005 - 1.030   pH 6.0 5.0 - 8.0   Glucose, UA 50 (A) NEGATIVE mg/dL   Hgb urine dipstick NEGATIVE NEGATIVE   Bilirubin Urine NEGATIVE NEGATIVE   Ketones, ur 5 (A) NEGATIVE mg/dL   Protein, ur NEGATIVE NEGATIVE mg/dL   Nitrite POSITIVE (A)  NEGATIVE   Leukocytes,Ua LARGE (A) NEGATIVE   RBC / HPF 0-5 0 - 5 RBC/hpf   WBC, UA >50 (H) 0 - 5 WBC/hpf   Bacteria, UA RARE (A) NONE SEEN   Squamous Epithelial / LPF 0-5 0 - 5  Mucus PRESENT     Comment: Performed at [facility], [place] [address] [place], [place] [address]  Resp Panel by RT-PCR (Flu A&B, Covid) Nasopharyngeal Swab     Status: None   Collection Time: 10/02/20  2:27 AM   Specimen: Nasopharyngeal Swab; Nasopharyngeal(NP) swabs in vial transport medium  Result Value Ref Range   SARS Coronavirus 2 by RT PCR NEGATIVE NEGATIVE    Comment: (NOTE) SARS-CoV-2 target nucleic acids are NOT DETECTED.  The SARS-CoV-2 RNA is generally detectable in upper respiratory specimens during the acute phase of infection. The lowest concentration of SARS-CoV-2 viral copies this assay can detect is 138 copies/mL. A negative result does not preclude SARS-Cov-2 infection and should not be used as the sole basis for treatment or other patient management decisions. A negative result may occur with  improper specimen collection/handling, submission of specimen other than nasopharyngeal swab, presence of viral mutation(s) within the areas targeted by this assay, and inadequate number of viral copies(<138 copies/mL). A negative result must be combined with clinical observations, patient history, and epidemiological information. The expected result is Negative.  Fact Sheet for Patients:  EntrepreneurPulse.com.au  Fact Sheet for Healthcare Providers:  IncredibleEmployment.be  This test is no t yet approved or cleared by the [place] FDA and  has been authorized for detection and/or diagnosis of SARS-CoV-2 by FDA under an Emergency Use Authorization (EUA). This EUA will remain  in effect (meaning this test can be used) for the duration of the COVID-19 declaration under Section 564(b)(1) of the Act, 21 U.S.C.section 360bbb-3(b)(1),  unless the authorization is terminated  or revoked sooner.       Influenza A by PCR NEGATIVE NEGATIVE   Influenza B by PCR NEGATIVE NEGATIVE    Comment: (NOTE) The Xpert Xpress SARS-CoV-2/FLU/RSV plus assay is intended as an aid in the diagnosis of influenza from Nasopharyngeal swab specimens and should not be used as a sole basis for treatment. Nasal washings and aspirates are unacceptable for Xpert Xpress SARS-CoV-2/FLU/RSV testing.  Fact Sheet for Patients: EntrepreneurPulse.com.au  Fact Sheet for Healthcare Providers: IncredibleEmployment.be  This test is not yet approved or cleared by the [place] FDA and has been authorized for detection and/or diagnosis of SARS-CoV-2 by FDA under an Emergency Use Authorization (EUA). This EUA will remain in effect (meaning this test can be used) for the duration of the COVID-19 declaration under Section 564(b)(1) of the Act, 21 U.S.C. section 360bbb-3(b)(1), unless the authorization is terminated or revoked.  Performed at [facility], [place] [address], [place], [place] [address]    DG Ribs Unilateral W/Chest Left  Result Date: 10/01/2020 CLINICAL DATA:  Left-sided rib pain following fall, initial encounter EXAM: LEFT RIBS AND CHEST - 3+ VIEW COMPARISON:  09/26/2019 FINDINGS: Cardiac shadow is at the upper limits of normal in size. Aortic calcifications are noted. The lungs are well aerated bilaterally. Chronic elevation of left hemidiaphragm is seen. No pneumothorax is noted. No acute rib fracture is noted. IMPRESSION: No acute rib fracture is seen. Electronically Signed   By: Inez Catalina M.D.   On: 10/01/2020 21:46   DG Lumbar Spine Complete  Result Date: 10/01/2020 CLINICAL DATA:  [place], left hip pain EXAM: LUMBAR SPINE - COMPLETE 4+ VIEW COMPARISON:  12/21/2017 FINDINGS: Frontal, bilateral oblique, and lateral views of the lumbar spine are obtained. Five non-rib-bearing lumbar  type vertebral bodies are again identified with severe right convex scoliosis centered at the thoracolumbar junction, stable. Bones are diffusely osteopenic. Stable  L1 compression deformity. Stable degenerative changes surrounding the L3/L4 disc space, with invagination of the inferior L3 endplate and superior L4 endplate. Diffuse multilevel spondylosis and facet hypertrophy, greatest at the thoracolumbar junction, grossly unchanged. Diffuse vascular calcifications are again noted. IMPRESSION: 1. Stable appearance of the lumbar spine with multilevel degenerative changes and chronic compression deformities as above. No acute bony abnormality. Electronically Signed   By: Randa Ngo M.D.   On: 10/01/2020 23:09   CT Head Wo Contrast  Result Date: 10/01/2020 CLINICAL DATA:  [place], hit head EXAM: CT HEAD WITHOUT CONTRAST TECHNIQUE: Contiguous axial images were obtained from the base of the skull through the vertex without intravenous contrast. COMPARISON:  10/21/2019 FINDINGS: Brain: Stable chronic ischemic changes are seen within the right temporoparietal region and throughout the periventricular white matter. No signs of acute infarct or hemorrhage. The lateral ventricles and midline structures are unremarkable. No acute extra-axial fluid collections. No mass effect. Vascular: No hyperdense vessel or unexpected calcification. Skull: Normal. Negative for fracture or focal lesion. Sinuses/Orbits: No acute finding. Other: None. IMPRESSION: 1. No acute intracranial process.  Stable chronic ischemic change. Electronically Signed   By: Randa Ngo M.D.   On: 10/01/2020 22:02   CT Cervical Spine Wo Contrast  Result Date: 10/01/2020 CLINICAL DATA:  [place], hit head EXAM: CT CERVICAL SPINE WITHOUT CONTRAST TECHNIQUE: Multidetector CT imaging of the cervical spine was performed without intravenous contrast. Multiplanar CT image reconstructions were also generated. COMPARISON:  10/21/2019 FINDINGS: Alignment: Alignment  is grossly anatomic. Skull base and vertebrae: No acute fracture. No primary bone lesion or focal pathologic process. Soft tissues and spinal canal: No prevertebral fluid or swelling. No visible canal hematoma. Disc levels: Stable diffuse spondylosis and facet hypertrophy throughout the entirety of the cervical spine. Upper chest: Airway is patent.  Lung apices are clear. Other: Reconstructed images demonstrate no additional findings. IMPRESSION: 1. No acute cervical spine fracture. 2. Stable multilevel cervical spondylosis and facet hypertrophy. Electronically Signed   By: Randa Ngo M.D.   On: 10/01/2020 22:05   DG Hip Unilat With Pelvis 2-3 Views Left  Result Date: 10/01/2020 CLINICAL DATA:  Left hip pain following fall, initial encounter EXAM: DG HIP (WITH OR WITHOUT PELVIS) 3V LEFT COMPARISON:  09/26/2019, 12/21/2017 FINDINGS: Left hip replacement is again seen. Pelvic ring is intact. No acute fracture is seen. Degenerative changes of the lumbar spine are noted. IMPRESSION: Status post left hip replacement.  No acute abnormality is noted. Electronically Signed   By: Inez Catalina M.D.   On: 10/01/2020 21:45    Pending Labs Unresulted Labs (From admission, onward)          Start     Ordered   10/02/20 1517  Basic metabolic panel  Tomorrow morning,   R        10/02/20 0404   10/02/20 0405  TSH  Once,   STAT        10/02/20 0404   10/02/20 0405  Vitamin B12  Once,   STAT        10/02/20 0404   10/02/20 0405  Ammonia  Once,   STAT        10/02/20 0404   10/02/20 0145  Urine culture  ONCE - STAT,   STAT        10/02/20 0145          Vitals/Pain Today's Vitals   10/02/20 0040 10/02/20 0130 10/02/20 0245 10/02/20 0345  BP:  (!) 149/53 (!) 153/51 (!) 158/54  Pulse:  79 65 66  Resp:  14 18 14   Temp:      TempSrc:      SpO2:  100% 99% 100%  PainSc: 4        Isolation Precautions No active isolations  Medications Medications  cefTRIAXone (ROCEPHIN) 1 g in sodium chloride 0.9  % 100 mL IVPB (0 g Intravenous Stopped 10/02/20 0436)  cefTRIAXone (ROCEPHIN) 1 g in sodium chloride 0.9 % 100 mL IVPB (has no administration in time range)  enoxaparin (LOVENOX) injection 30 mg (has no administration in time range)  acetaminophen (TYLENOL) tablet 650 mg (has no administration in time range)    Or  acetaminophen (TYLENOL) suppository 650 mg (has no administration in time range)  0.9 %  sodium chloride infusion (has no administration in time range)  fentaNYL (SUBLIMAZE) injection 25 mcg (25 mcg Intravenous Given 10/01/20 2217)  ondansetron (ZOFRAN) injection 4 mg (4 mg Intravenous Given 10/01/20 2217)  acetaminophen (TYLENOL) tablet 650 mg (650 mg Oral Given 10/02/20 0006)    Mobility walks with device

## 2020-10-02 NOTE — Evaluation (Signed)
Physical Therapy Evaluation Patient Details Name: Tiffany [name] MRN: [id number] DOB: [birth year] Today's Date: 10/02/2020   History of Present Illness  87 y.o. female admitted with UTI, falls at home, L hip pain, L side posterior back pain, FTT. History significant of dementia, hypertension, CKD stage IIIa, hypothyroidism, history of skin and breast cancer, CVA.  Clinical Impression  On eval, pt required Mod A for mobility. She was able to stand and take a few side steps along the side of the bed with a RW. Mobility limited by weakness, pain (back, L hip). Assisted pt back to bed at end of session. Will plan to follow and progress activity as tolerated. Recommend SNF for rehab.     Follow Up Recommendations SNF    Equipment Recommendations  None recommended by PT    Recommendations for Other Services       Precautions / Restrictions Precautions Precautions: Fall Restrictions Weight Bearing Restrictions: No      Mobility  Bed Mobility Overal bed mobility: Needs Assistance Bed Mobility: Supine to Sit;Sit to Supine     Supine to sit: Mod assist Sit to supine: Mod assist   General bed mobility comments: Assist for trunk and bil LEs. Cues for safety, technique. Increased time. Utilized bepad to aid with scooting, positioning. Limited by pain    Transfers Overall transfer level: Needs assistance Equipment used: Rolling walker (2 wheeled) Transfers: Sit to/from Stand Sit to Stand: Min assist;From elevated surface        General transfer comment: Assist to rise, stabilize, control descent. Cues for safety, technique, hand placement. Increased time.  Ambulation/Gait Ambulation/Gait assistance: Min assist   Assistive device: Rolling walker (2 wheeled)       General Gait Details: side steps along side of bed with RW. Assist to stabilize/support pt and manage RW. Limited by pain.  Stairs            Wheelchair Mobility    Modified Rankin (Stroke Patients  Only)       Balance Overall balance assessment: Needs assistance Sitting-balance support: No upper extremity supported;Feet supported Sitting balance-Leahy Scale: Good     Standing balance support: Bilateral upper extremity supported Standing balance-Leahy Scale: Poor Standing balance comment: reliant on walker                             Pertinent Vitals/Pain Pain Assessment: Faces Faces Pain Scale: Hurts even more Pain Location: Left posterior back, L hip Pain Descriptors / Indicators: Discomfort;Sore Pain Intervention(s): Monitored during session;Limited activity within patient's tolerance;Repositioned    Home Living Family/patient expects to be discharged to:: Skilled nursing facility                 Additional Comments: Patient from [place]    Prior Function Level of Independence: Independent with assistive device(s)         Comments: Reports using a rollator. Independent with ADLs. A girl comes and helps her with bathing and her son helps with groceries. Has had falls at home prior to this admission.     Hand Dominance   Dominant Hand: Right    Extremity/Trunk Assessment   Upper Extremity Assessment Upper Extremity Assessment: Defer to OT evaluation    Lower Extremity Assessment Lower Extremity Assessment: Generalized weakness    Cervical / Trunk Assessment Cervical / Trunk Assessment: Kyphotic  Communication   Communication: No difficulties  Cognition Arousal/Alertness: Awake/alert Behavior During Therapy: WFL for tasks assessed/performed Overall  Cognitive Status: Within Functional Limits for tasks assessed                                 General Comments: A x 3 - not to time      General Comments      Exercises     Assessment/Plan    PT Assessment Patient needs continued PT services  PT Problem List Decreased strength;Decreased mobility;Decreased activity tolerance;Decreased  balance;Decreased knowledge of use of DME;Pain       PT Treatment Interventions DME instruction;Gait training;Therapeutic exercise;Balance training;Functional mobility training;Therapeutic activities;Patient/family education    PT Goals (Current goals can be found in the Care Plan section)  Acute Rehab PT Goals Patient Stated Goal: return to independent living PT Goal Formulation: With patient Time For Goal Achievement: 10/16/20 Potential to Achieve Goals: Good    Frequency Min 2X/week   Barriers to discharge Decreased caregiver support      Co-evaluation               AM-PAC PT "6 Clicks" Mobility  Outcome Measure Help needed turning from your back to your side while in a flat bed without using bedrails?: A Little Help needed moving from lying on your back to sitting on the side of a flat bed without using bedrails?: A Lot Help needed moving to and from a bed to a chair (including a wheelchair)?: A Lot Help needed standing up from a chair using your arms (e.g., wheelchair or bedside chair)?: A Lot Help needed to walk in hospital room?: A Lot Help needed climbing 3-5 steps with a railing? : Total 6 Click Score: 12    End of Session Equipment Utilized During Treatment: Gait belt Activity Tolerance: Patient limited by pain Patient left: in bed;with call bell/phone within reach;with bed alarm set   PT Visit Diagnosis: Muscle weakness (generalized) (M62.81);Difficulty in walking, not elsewhere classified (R26.2);Pain    Time: 7035-0093 PT Time Calculation (min) (ACUTE ONLY): 10 min   Charges:   PT Evaluation $PT Eval Moderate Complexity: 1 Mod            Doreatha Massed, PT Acute Rehabilitation  Office: [phone] Pager: [phone]

## 2020-10-02 NOTE — Evaluation (Signed)
Occupational Therapy Evaluation Patient Details Name: Tiffany [name] MRN: [id number] DOB: 1926-12-11 Today's Date: 10/02/2020    History of Present Illness Tiffany [name] is a 87 y.o. female with medical history significant of dementia, hypertension, CKD stage IIIa, hypothyroidism, history of skin and breast cancer, history of CVA presenting to the ED from her independent living facility as she had 2 mechanical falls today and complained of left hip pain and left-sided rib pain. Imaging negative.   Clinical Impression   Tiffany [name] is a 87 year old woman who presents with generalized weakness, decreased activity tolerance, impaired balance and pain resulting in a decline in functional abilities. At baseline patient lives in [place] and is able to ambulate with rolator and independent with most ADLs - having assistance for bathing for safety. Today patient min assist for standing and short ambulation for steadying - patient exhibiting altered gait due to pain in LLE and back. Patient requiring set up and seated position for UB ADLs and min-mod assist for LB ADLs and toileting. Patient reports pain not controlled and limited patient's tolerance for movement. Patient is at risk for continued falls and further injury. Recommend short term rehab at discharge.    Follow Up Recommendations  SNF    Equipment Recommendations  None recommended by OT    Recommendations for Other Services       Precautions / Restrictions Precautions Precautions: Fall Restrictions Weight Bearing Restrictions: No      Mobility Bed Mobility Overal bed mobility: Needs Assistance Bed Mobility: Supine to Sit     Supine to sit: Supervision;HOB elevated     General bed mobility comments: increased time    Transfers Overall transfer level: Needs assistance Equipment used: Rolling walker (2 wheeled) Transfers: Sit to/from [facility] Sit to Stand: Min assist Stand pivot  transfers: Min assist            Balance Overall balance assessment: Needs assistance Sitting-balance support: No upper extremity supported;Feet supported Sitting balance-Leahy Scale: Good     Standing balance support: No upper extremity supported;During functional activity Standing balance-Leahy Scale: Poor Standing balance comment: reliant on walker                           ADL either performed or assessed with clinical judgement   ADL Overall ADL's : Needs assistance/impaired Eating/Feeding: Set up;Sitting   Grooming: Set up;Sitting   Upper Body Bathing: Set up;Sitting   Lower Body Bathing: Minimal assistance;Sit to/from stand   Upper Body Dressing : Set up;Sitting   Lower Body Dressing: Moderate assistance;Sit to/from stand   Toilet Transfer: Minimal assistance;Regular Toilet;Grab bars;RW   Toileting- Clothing Manipulation and Hygiene: Minimal assistance;Sit to/from stand       Functional mobility during ADLs: Minimal assistance;Rolling walker       Vision Patient Visual Report: No change from baseline       Perception     Praxis      Pertinent Vitals/Pain Pain Assessment: Faces Faces Pain Scale: Hurts even more Pain Location: Left back Pain Descriptors / Indicators: Grimacing;Guarding Pain Intervention(s): Monitored during session;Limited activity within patient's tolerance     Hand Dominance Right   Extremity/Trunk Assessment Upper Extremity Assessment Upper Extremity Assessment: Generalized weakness   Lower Extremity Assessment Lower Extremity Assessment: Defer to PT evaluation   Cervical / Trunk Assessment Cervical / Trunk Assessment: Kyphotic   Communication Communication Communication: No difficulties   Cognition Arousal/Alertness: Awake/alert Behavior During Therapy: [facility]  for tasks assessed/performed Overall Cognitive Status: Within Functional Limits for tasks assessed                                  General Comments: A x 3 - not to time   General Comments       Exercises     Shoulder Instructions      Home Living Family/patient expects to be discharged to:: Skilled nursing facility                                 Additional Comments: Patient from [place]      Prior Functioning/Environment Level of Independence: Independent with assistive device(s)        Comments: Reports using a rolator. Independent with ADLs. A girl comes and helps her with bathing and her son helps with groceries.        OT Problem List: Decreased strength;Decreased activity tolerance;Impaired balance (sitting and/or standing);Decreased cognition;Decreased knowledge of use of DME or AE;Pain      OT Treatment/Interventions: Self-care/ADL training;Therapeutic exercise;DME and/or AE instruction;Therapeutic activities;Balance training;Patient/family education    OT Goals(Current goals can be found in the care plan section) Acute Rehab OT Goals Patient Stated Goal: return to independent living OT Goal Formulation: With patient Time For Goal Achievement: 10/16/20 Potential to Achieve Goals: Good  OT Frequency: Min 2X/week   Barriers to D/C:            Co-evaluation              AM-PAC OT "6 Clicks" Daily Activity     Outcome Measure Help from another person eating meals?: A Little Help from another person taking care of personal grooming?: A Little Help from another person toileting, which includes using toliet, bedpan, or urinal?: A Little Help from another person bathing (including washing, rinsing, drying)?: A Little Help from another person to put on and taking off regular upper body clothing?: A Little Help from another person to put on and taking off regular lower body clothing?: A Lot 6 Click Score: 17   End of Session Equipment Utilized During Treatment: Rolling walker;Gait belt Nurse Communication: Mobility status  Activity Tolerance:  Patient limited by pain Patient left: in chair;with call bell/phone within reach;with chair alarm set  OT Visit Diagnosis: Unsteadiness on feet (R26.81);Other abnormalities of gait and mobility (R26.89);Pain;Muscle weakness (generalized) (M62.81)                Time: 4315-4008 OT Time Calculation (min): 27 min Charges:  OT General Charges $OT Visit: 1 Visit OT Evaluation $OT Eval Low Complexity: 1 Low OT Treatments $Self Care/Home Management : 8-22 mins  Sherill Mangen, OTR/L [place] [phone] Pager: [place] 10/02/2020, 11:08 AM

## 2020-10-03 DIAGNOSIS — N179 Acute kidney failure, unspecified: Secondary | ICD-10-CM | POA: Diagnosis not present

## 2020-10-03 DIAGNOSIS — Y92031 Bathroom in apartment as the place of occurrence of the external cause: Secondary | ICD-10-CM | POA: Diagnosis not present

## 2020-10-03 DIAGNOSIS — E039 Hypothyroidism, unspecified: Secondary | ICD-10-CM | POA: Diagnosis not present

## 2020-10-03 DIAGNOSIS — G9341 Metabolic encephalopathy: Secondary | ICD-10-CM | POA: Diagnosis not present

## 2020-10-03 DIAGNOSIS — R8271 Bacteriuria: Secondary | ICD-10-CM | POA: Diagnosis not present

## 2020-10-03 DIAGNOSIS — E876 Hypokalemia: Secondary | ICD-10-CM | POA: Diagnosis not present

## 2020-10-03 DIAGNOSIS — Z79890 Hormone replacement therapy (postmenopausal): Secondary | ICD-10-CM | POA: Diagnosis not present

## 2020-10-03 DIAGNOSIS — E860 Dehydration: Secondary | ICD-10-CM | POA: Diagnosis present

## 2020-10-03 DIAGNOSIS — Y9301 Activity, walking, marching and hiking: Secondary | ICD-10-CM | POA: Diagnosis not present

## 2020-10-03 DIAGNOSIS — N390 Urinary tract infection, site not specified: Secondary | ICD-10-CM | POA: Diagnosis not present

## 2020-10-03 DIAGNOSIS — Z7982 Long term (current) use of aspirin: Secondary | ICD-10-CM | POA: Diagnosis not present

## 2020-10-03 DIAGNOSIS — M1990 Unspecified osteoarthritis, unspecified site: Secondary | ICD-10-CM | POA: Diagnosis present

## 2020-10-03 DIAGNOSIS — R509 Fever, unspecified: Secondary | ICD-10-CM | POA: Diagnosis not present

## 2020-10-03 DIAGNOSIS — Z8249 Family history of ischemic heart disease and other diseases of the circulatory system: Secondary | ICD-10-CM | POA: Diagnosis not present

## 2020-10-03 DIAGNOSIS — Z833 Family history of diabetes mellitus: Secondary | ICD-10-CM | POA: Diagnosis not present

## 2020-10-03 DIAGNOSIS — W010XXA Fall on same level from slipping, tripping and stumbling without subsequent striking against object, initial encounter: Secondary | ICD-10-CM | POA: Diagnosis not present

## 2020-10-03 DIAGNOSIS — F0390 Unspecified dementia without behavioral disturbance: Secondary | ICD-10-CM | POA: Diagnosis present

## 2020-10-03 DIAGNOSIS — Z66 Do not resuscitate: Secondary | ICD-10-CM | POA: Diagnosis not present

## 2020-10-03 DIAGNOSIS — Z20822 Contact with and (suspected) exposure to covid-19: Secondary | ICD-10-CM | POA: Diagnosis not present

## 2020-10-03 DIAGNOSIS — I129 Hypertensive chronic kidney disease with stage 1 through stage 4 chronic kidney disease, or unspecified chronic kidney disease: Secondary | ICD-10-CM | POA: Diagnosis not present

## 2020-10-03 DIAGNOSIS — R531 Weakness: Secondary | ICD-10-CM | POA: Diagnosis present

## 2020-10-03 DIAGNOSIS — B9620 Unspecified Escherichia coli [E. coli] as the cause of diseases classified elsewhere: Secondary | ICD-10-CM | POA: Diagnosis present

## 2020-10-03 DIAGNOSIS — I10 Essential (primary) hypertension: Secondary | ICD-10-CM | POA: Diagnosis not present

## 2020-10-03 DIAGNOSIS — R296 Repeated falls: Secondary | ICD-10-CM | POA: Diagnosis not present

## 2020-10-03 DIAGNOSIS — E875 Hyperkalemia: Secondary | ICD-10-CM | POA: Diagnosis not present

## 2020-10-03 DIAGNOSIS — N1831 Chronic kidney disease, stage 3a: Secondary | ICD-10-CM | POA: Diagnosis present

## 2020-10-03 DIAGNOSIS — D539 Nutritional anemia, unspecified: Secondary | ICD-10-CM | POA: Diagnosis present

## 2020-10-03 DIAGNOSIS — Z83511 Family history of glaucoma: Secondary | ICD-10-CM | POA: Diagnosis not present

## 2020-10-03 DIAGNOSIS — Z9013 Acquired absence of bilateral breasts and nipples: Secondary | ICD-10-CM | POA: Diagnosis not present

## 2020-10-03 LAB — BASIC METABOLIC PANEL
Anion gap: 5 (ref 5–15)
BUN: 22 mg/dL (ref 8–23)
CO2: 23 mmol/L (ref 22–32)
Calcium: 8.5 mg/dL — ABNORMAL LOW (ref 8.9–10.3)
Chloride: 113 mmol/L — ABNORMAL HIGH (ref 98–111)
Creatinine, Ser: 1.57 mg/dL — ABNORMAL HIGH (ref 0.44–1.00)
GFR, Estimated: 31 mL/min — ABNORMAL LOW (ref >60)
Glucose, Bld: 108 mg/dL — ABNORMAL HIGH (ref 70–99)
Potassium: 5.7 mmol/L — ABNORMAL HIGH (ref 3.5–5.1)
Sodium: 141 mmol/L (ref 135–145)

## 2020-10-03 LAB — POTASSIUM: Potassium: 4.6 mmol/L (ref 3.5–5.1)

## 2020-10-03 NOTE — Care Management Obs Status (Signed)
[place] NOTIFICATION   Patient Details  Name: Tiffany [name] MRN: [id number] Date of Birth: 1926/07/19   Medicare Observation Status Notification Given:  Yes    [name], RN 10/03/2020, 1:25 PM

## 2020-10-03 NOTE — Plan of Care (Signed)
Miss Tiffany [name] was able to rest comfortably last night; no s/s of acute distress reported or observed; Rx management effective for acute hip pain; call light within reach and bed at lowest position for safety.

## 2020-10-03 NOTE — NC FL2 (Signed)
[place] LEVEL OF CARE SCREENING TOOL     IDENTIFICATION  Patient Name: Tiffany [name] Birthdate: [birth year] Sex: female Admission Date (Current Location): 10/01/2020  [facility] and [place] Number:  Herbalist and Address:  [facility],  [place] [address] [place]      Provider Number: [id number]  Attending Physician Name and Address:  Darliss Cheney, MD  Relative Name and Phone Number:       Current Level of Care: Hospital Recommended Level of Care: [place] Prior Approval Number:    Date Approved/Denied:   PASRR Number:    Discharge Plan: SNF    Current Diagnoses: Patient Active Problem List   Diagnosis Date Noted  . UTI (urinary tract infection) 10/02/2020  . AKI (acute kidney injury) ([place]) 10/02/2020  . Frequent falls 10/02/2020  . Cerebral embolism with cerebral infarction 12/23/2017  . Pressure injury of skin 12/22/2017  . Fall 12/21/2017  . Generalized weakness 12/21/2017  . Rhabdomyolysis 12/21/2017  . CKD (chronic kidney disease) stage 3, GFR 30-59 ml/min (HCC) 12/21/2017  . Hypothyroidism 12/21/2017  . Palpitations 12/17/2012  . Internal bleeding hemorrhoids 10/26/2012  . Rectal bleeding 10/04/2012  . HTN (hypertension) 10/04/2012    Orientation RESPIRATION BLADDER Height & Weight     Self,Situation,Place  Normal Incontinent Weight: 67.1 kg Height:  5\' 7"  (170.2 cm)  BEHAVIORAL SYMPTOMS/MOOD NEUROLOGICAL BOWEL NUTRITION STATUS      Continent Diet  AMBULATORY STATUS COMMUNICATION OF NEEDS Skin   Extensive Assist Verbally Normal                       Personal Care Assistance Level of Assistance  Bathing,Dressing Bathing Assistance: Limited assistance Feeding assistance: Independent Dressing Assistance: Limited assistance     Functional Limitations Info  Sight,Hearing,Speech Sight Info: Adequate Hearing Info: Adequate Speech Info: Adequate    SPECIAL CARE FACTORS FREQUENCY   PT (By licensed PT),OT (By licensed OT)     PT Frequency: 5 x weekly OT Frequency: 5 x weekly            Contractures Contractures Info: Not present    Additional Factors Info  Code Status,Allergies Code Status Info: DNR Allergies Info: Codeine, Morphine, Sulfa           Current Medications (10/03/2020):  This is the current hospital active medication list Current Facility-Administered Medications  Medication Dose Route Frequency Provider Last Rate Last Admin  . 0.9 %  sodium chloride infusion   Intravenous Continuous Darliss Cheney, MD 75 mL/hr at 10/03/20 0600 Infusion Verify at 10/03/20 0600  . acetaminophen (TYLENOL) tablet 650 mg  650 mg Oral Q6H PRN Shela Leff, MD   650 mg at 10/02/20 4540   Or  . acetaminophen (TYLENOL) suppository 650 mg  650 mg Rectal Q6H PRN Shela Leff, MD      . enoxaparin (LOVENOX) injection 30 mg  30 mg Subcutaneous Q24H Shela Leff, MD   30 mg at 10/03/20 0949  . levothyroxine (SYNTHROID) tablet 88 mcg  88 mcg Oral QAC breakfast Darliss Cheney, MD   88 mcg at 10/03/20 0541  . memantine (NAMENDA) tablet 10 mg  10 mg Oral QHS Darliss Cheney, MD   10 mg at 10/02/20 2203  . metoprolol tartrate (LOPRESSOR) tablet 50 mg  50 mg Oral BID Darliss Cheney, MD   50 mg at 10/03/20 0949  . oxyCODONE (Oxy IR/ROXICODONE) immediate release tablet 5 mg  5 mg Oral Q6H PRN Pahwani, Einar Grad,  MD   5 mg at 10/03/20 0952  . sertraline (ZOLOFT) tablet 25 mg  25 mg Oral Daily Darliss Cheney, MD   25 mg at 10/03/20 5916     Discharge Medications: Please see discharge summary for a list of discharge medications.  Relevant Imaging Results:  Relevant Lab Results:   Additional Information SS# [id number]  Mariaeduarda Defranco, Marjie Skiff, RN

## 2020-10-03 NOTE — TOC Initial Note (Addendum)
Transition of Care [facility]) - Initial/Assessment Note    Patient Details  Name: Tiffany [name] MRN: [id number] Date of Birth: 1927/04/01  Transition of Care [facility]) CM/SW Contact:    Tiffany Catalan, RN Phone Number: 10/03/2020, 12:12 PM  Clinical Narrative:                 Spoke with son Tiffany [name] for [employer]. Pt lives in an [place] living facility [facility]). She has friends/family come and assist her as they are able but no one is with her 24hrs a day. Spoke with the liaison at [facility] who states that they only have independent living level at this facility. Tiffany [name] gives permission to fax out FL2 to area facilities for SNF beds. TOC will give Tiffany [name] bed offers as they are received.   Expected Discharge Plan: Skilled Nursing Facility Barriers to Discharge: No Barriers Identified   Patient Goals and CMS Choice        Expected Discharge Plan and Services Expected Discharge Plan: [place]   Discharge Planning Services: CM Consult   Living arrangements for the past 2 months: [place]                                      Prior Living Arrangements/Services Living arrangements for the past 2 months: [place] Lives with:: Self Patient language and need for interpreter reviewed:: Yes        Need for Family Participation in Patient Care: Yes (Comment) Care giver support system in place?: Yes (comment)   Criminal Activity/Legal Involvement Pertinent to Current Situation/Hospitalization: No - Comment as needed  Activities of Daily Living Home Assistive Devices/Equipment: Wheelchair,Walker (specify type) ADL Screening (condition at time of admission) Patient's cognitive ability adequate to safely complete daily activities?: No Is the patient deaf or have difficulty hearing?: No Does the patient have difficulty seeing, even when wearing glasses/contacts?: No Does the patient have difficulty concentrating, remembering,  or making decisions?: Yes Patient able to express need for assistance with ADLs?: Yes Does the patient have difficulty dressing or bathing?: Yes Independently performs ADLs?: No Does the patient have difficulty walking or climbing stairs?: Yes Weakness of Legs: Left Weakness of Arms/Hands: None  Permission Sought/Granted                  Emotional Assessment              Admission diagnosis:  UTI (urinary tract infection) [N39.0] Acute cystitis without hematuria [N30.00] AKI (acute kidney injury) ([place]) [N17.9] Fall, initial encounter [W19.XXXA] Patient Active Problem List   Diagnosis Date Noted  . UTI (urinary tract infection) 10/02/2020  . AKI (acute kidney injury) ([place]) 10/02/2020  . Frequent falls 10/02/2020  . Cerebral embolism with cerebral infarction 12/23/2017  . Pressure injury of skin 12/22/2017  . Fall 12/21/2017  . Generalized weakness 12/21/2017  . Rhabdomyolysis 12/21/2017  . CKD (chronic kidney disease) stage 3, GFR 30-59 ml/min (HCC) 12/21/2017  . Hypothyroidism 12/21/2017  . Palpitations 12/17/2012  . Internal bleeding hemorrhoids 10/26/2012  . Rectal bleeding 10/04/2012  . HTN (hypertension) 10/04/2012   PCP:  Donald Prose, MD Pharmacy:   [facility] [address] [place] [facility] [address] [place] [address] Phone: [phone] Fax: [phone]     Social Determinants of Health (SDOH) Interventions    Readmission Risk Interventions No flowsheet data found.

## 2020-10-03 NOTE — Progress Notes (Signed)
PROGRESS NOTE    [name]  JQB:[id number] DOB: May 29, 1926 DOA: 10/01/2020 PCP: Donald Prose, MD   Brief Narrative:  Angle [name] is a 87 y.o. female with medical history significant of dementia, hypertension, CKD stage IIIa, hypothyroidism, history of skin and breast cancer, history of CVA presented to the ED from her independent living facility as she had 2 mechanical falls and left hip pain and left-sided rib pain.  Patient's son was concerned about increasing confusion as she had an episode where she urinated on the floor and told him that she was driving one of her sweaters.  Son was concerned about her ability to care for herself as she has become increasingly weak. Patient's son is not able to care for her at home as he is caring for his autistic son by himself.    In the ED, patient was hemodynamically stable. COVID and influenza PCR negative.  Chest x-ray not suggestive of pneumonia.  X-rays negative for hip or rib fracture.  Lumbar x-ray negative for acute finding.  CT head/C-spine negative for acute finding.  Admitting hospitalist had thought that she was having UTI.  She was admitted with acute encephalopathy secondary to UTI.  Assessment & Plan:   Principal Problem:   UTI (urinary tract infection) Active Problems:   HTN (hypertension)   Hypothyroidism   AKI (acute kidney injury) ([place])   Frequent falls   UTI/bacteriuria: Patient denies any urinary complaints.  She has no leukocytosis or fever.  No suprapubic tenderness.  Discussed with the son, I learned that patient has progressively worsening and severe dementia and she cannot even operate the TV remote control and time does not mean anything to her.  She tends to forget most of the things.  Based on this information, slight worsening confusion yesterday could be just delirium.  I discontinued antibiotics.  AKI on CKD stage IIIa/dehydration: Creatinine slightly worse than yesterday.  She has poor p.o. intake despite  of encouragement.  I will increase her frequency of IV fluids to 125 cc/h from 75 cc.  Hypokalemia: Again likely secondary to dehydration.  We will repeat potassium later.  Hopefully this will improve with normal saline hydration.  Acute metabolic encephalopathy: Likely delirium at her ALF.  She is alert and partially oriented, at her baseline.  Frequent falls/debility: Seen by OT, they recommend SNF.  PT assessment pending but she will likely require either SNF or long-term care.  She lives in assisted living facility and she is not safe to go back and live by herself.  Son agrees with that.  TOC working with son for placement.  Dementia -Follow delirium precautions.  Resume home medications.  Hypertension: Only slightly elevated.  Continue metoprolol but continue to hold Avapro due to AKI.  Hypothyroidism: Continue Synthroid.  DVT prophylaxis: enoxaparin (LOVENOX) injection 30 mg Start: 10/02/20 1000   Code Status: DNR  Family Communication:  None present at bedside.  Discussed with her son over the yesterday.  Status is: Observation  The patient will require care spanning > 2 midnights and should be moved to inpatient because: Unsafe d/c plan  Dispo: The patient is from: Home              Anticipated d/c is to: SNF              Patient currently is not medically stable to d/c.   Difficult to place patient No        Estimated body mass index is 23.17 kg/m as calculated  from the following:   Height as of this encounter: 5\' 7"  (1.702 m).   Weight as of this encounter: 67.1 kg.  Pressure Injury 12/22/17 Deep Tissue Injury - Purple or maroon localized area of discolored intact skin or blood-filled blister due to damage of underlying soft tissue from pressure and/or shear. (Active)  12/22/17 1850  Location: Buttocks  Location Orientation:   Staging: Deep Tissue Injury - Purple or maroon localized area of discolored intact skin or blood-filled blister due to damage of  underlying soft tissue from pressure and/or shear.  Wound Description (Comments):   Present on Admission: Yes     Nutritional status:               Consultants:   None  Procedures:   None  Antimicrobials:  Anti-infectives (From admission, onward)   Start     Dose/Rate Route Frequency Ordered Stop   10/03/20 0200  cefTRIAXone (ROCEPHIN) 1 g in sodium chloride 0.9 % 100 mL IVPB  Status:  Discontinued        1 g 200 mL/hr over 30 Minutes Intravenous Every 24 hours 10/02/20 0403 10/02/20 1354   10/02/20 0200  cefTRIAXone (ROCEPHIN) 1 g in sodium chloride 0.9 % 100 mL IVPB        1 g 200 mL/hr over 30 Minutes Intravenous  Once 10/02/20 0145 10/02/20 0436         Subjective: Seen and examined.  She has no complaints.  She remains alert and partially oriented like she was yesterday.  Pleasant.  Objective: Vitals:   10/02/20 1414 10/02/20 1728 10/02/20 2029 10/03/20 0557  BP: (!) 150/65 (!) 146/48 (!) 162/92 (!) 157/77  Pulse: 74 79 80 61  Resp: 18 18 18 19   Temp: 98.1 F (36.7 C) 98.4 F (36.9 C) 97.7 F (36.5 C) 98.7 F (37.1 C)  TempSrc: Oral Oral Oral Oral  SpO2: 99% 98% 92% 96%  Weight:      Height:        Intake/Output Summary (Last 24 hours) at 10/03/2020 1309 Last data filed at 10/03/2020 0846 Gross per 24 hour  Intake 1020 ml  Output 400 ml  Net 620 ml   Filed Weights   10/02/20 0538  Weight: 67.1 kg    Examination:  General exam: Appears calm and comfortable  Respiratory system: Clear to auscultation. Respiratory effort normal. Cardiovascular system: S1 & S2 heard, RRR. No JVD, murmurs, rubs, gallops or clicks. No pedal edema. Gastrointestinal system: Abdomen is nondistended, soft and nontender. No organomegaly or masses felt. Normal bowel sounds heard. Central nervous system: Alert and oriented x2. No focal neurological deficits. Extremities: Symmetric 5 x 5 power. Skin: No rashes, lesions or ulcers.  Psychiatry: Judgement and insight  appear poor  Data Reviewed: I have personally reviewed following labs and imaging studies  CBC: Recent Labs  Lab 10/01/20 2210  WBC 8.4  NEUTROABS 6.7  HGB 12.0  HCT 37.8  MCV 100.0  PLT 824   Basic Metabolic Panel: Recent Labs  Lab 10/01/20 2210 10/02/20 0811 10/03/20 0521  NA 145 144 141  K 4.0 3.6 5.7*  CL 107 110 113*  CO2 28 29 23   GLUCOSE 112* 130* 108*  BUN 35* 28* 22  CREATININE 1.40* 1.23* 1.57*  CALCIUM 9.6 8.8* 8.5*   GFR: Estimated Creatinine Clearance: 22.7 mL/min (A) (by C-G formula based on SCr of 1.57 mg/dL (H)). Liver Function Tests: No results for input(s): AST, ALT, ALKPHOS, BILITOT, PROT, ALBUMIN in the last 168  hours. No results for input(s): LIPASE, AMYLASE in the last 168 hours. Recent Labs  Lab 10/02/20 0811  AMMONIA 14   Coagulation Profile: No results for input(s): INR, PROTIME in the last 168 hours. Cardiac Enzymes: No results for input(s): CKTOTAL, CKMB, CKMBINDEX, TROPONINI in the last 168 hours. BNP (last 3 results) No results for input(s): PROBNP in the last 8760 hours. HbA1C: No results for input(s): HGBA1C in the last 72 hours. CBG: No results for input(s): GLUCAP in the last 168 hours. Lipid Profile: No results for input(s): CHOL, HDL, LDLCALC, TRIG, CHOLHDL, LDLDIRECT in the last 72 hours. Thyroid Function Tests: Recent Labs    10/02/20 0811  TSH 0.318*   Anemia Panel: Recent Labs    10/02/20 0811  VITAMINB12 83*   Sepsis Labs: No results for input(s): PROCALCITON, LATICACIDVEN in the last 168 hours.  Recent Results (from the past 240 hour(s))  Urine culture     Status: Abnormal (Preliminary result)   Collection Time: 10/01/20 11:45 PM   Specimen: Urine, Catheterized  Result Value Ref Range Status   Specimen Description   Final    URINE, CATHETERIZED Performed at [facility] [address] [place], [place] [address]    Special Requests   Final    NONE Performed at [facility], [place] [address] [place], [place] [address]    Culture (A)  Final    >=100,000 COLONIES/mL KLEBSIELLA PNEUMONIAE >=100,000 COLONIES/mL ESCHERICHIA COLI    Report Status PENDING  Incomplete  Resp Panel by RT-PCR (Flu A&B, Covid) Nasopharyngeal Swab     Status: None   Collection Time: 10/02/20  2:27 AM   Specimen: Nasopharyngeal Swab; Nasopharyngeal(NP) swabs in vial transport medium  Result Value Ref Range Status   SARS Coronavirus 2 by RT PCR NEGATIVE NEGATIVE Final    Comment: (NOTE) SARS-CoV-2 target nucleic acids are NOT DETECTED.  The SARS-CoV-2 RNA is generally detectable in upper respiratory specimens during the acute phase of infection. The lowest concentration of SARS-CoV-2 viral copies this assay can detect is 138 copies/mL. A negative result does not preclude SARS-Cov-2 infection and should not be used as the sole basis for treatment or other patient management decisions. A negative result may occur with  improper specimen collection/handling, submission of specimen other than nasopharyngeal swab, presence of viral mutation(s) within the areas targeted by this assay, and inadequate number of viral copies(<138 copies/mL). A negative result must be combined with clinical observations, patient history, and epidemiological information. The expected result is Negative.  Fact Sheet for Patients:  EntrepreneurPulse.com.au  Fact Sheet for Healthcare Providers:  IncredibleEmployment.be  This test is no t yet approved or cleared by the [place] FDA and  has been authorized for detection and/or diagnosis of SARS-CoV-2 by FDA under an Emergency Use Authorization (EUA). This EUA will remain  in effect (meaning this test can be used) for the duration of the COVID-19 declaration under Section 564(b)(1) of the Act, 21 U.S.C.section 360bbb-3(b)(1), unless the authorization is terminated  or revoked sooner.        Influenza A by PCR NEGATIVE NEGATIVE Final   Influenza B by PCR NEGATIVE NEGATIVE Final    Comment: (NOTE) The Xpert Xpress SARS-CoV-2/FLU/RSV plus assay is intended as an aid in the diagnosis of influenza from Nasopharyngeal swab specimens and should not be used as a sole basis for treatment. Nasal washings and aspirates are unacceptable for Xpert Xpress SARS-CoV-2/FLU/RSV testing.  Fact Sheet for Patients: EntrepreneurPulse.com.au  Fact Sheet for  Healthcare Providers: IncredibleEmployment.be  This test is not yet approved or cleared by the [place] and has been authorized for detection and/or diagnosis of SARS-CoV-2 by FDA under an Emergency Use Authorization (EUA). This EUA will remain in effect (meaning this test can be used) for the duration of the COVID-19 declaration under Section 564(b)(1) of the Act, 21 U.S.C. section 360bbb-3(b)(1), unless the authorization is terminated or revoked.  Performed at [facility], [place] [address] [place], [place] [address]       Radiology Studies: DG Ribs Unilateral W/Chest Left  Result Date: 10/01/2020 CLINICAL DATA:  Left-sided rib pain following fall, initial encounter EXAM: LEFT RIBS AND CHEST - 3+ VIEW COMPARISON:  09/26/2019 FINDINGS: Cardiac shadow is at the upper limits of normal in size. Aortic calcifications are noted. The lungs are well aerated bilaterally. Chronic elevation of left hemidiaphragm is seen. No pneumothorax is noted. No acute rib fracture is noted. IMPRESSION: No acute rib fracture is seen. Electronically Signed   By: Inez Catalina M.D.   On: 10/01/2020 21:46   DG Lumbar Spine Complete  Result Date: 10/01/2020 CLINICAL DATA:  [place], left hip pain EXAM: LUMBAR SPINE - COMPLETE 4+ VIEW COMPARISON:  12/21/2017 FINDINGS: Frontal, bilateral oblique, and lateral views of the lumbar spine are obtained. Five non-rib-bearing lumbar type vertebral bodies are again  identified with severe right convex scoliosis centered at the thoracolumbar junction, stable. Bones are diffusely osteopenic. Stable L1 compression deformity. Stable degenerative changes surrounding the L3/L4 disc space, with invagination of the inferior L3 endplate and superior L4 endplate. Diffuse multilevel spondylosis and facet hypertrophy, greatest at the thoracolumbar junction, grossly unchanged. Diffuse vascular calcifications are again noted. IMPRESSION: 1. Stable appearance of the lumbar spine with multilevel degenerative changes and chronic compression deformities as above. No acute bony abnormality. Electronically Signed   By: Randa Ngo M.D.   On: 10/01/2020 23:09   CT Head Wo Contrast  Result Date: 10/01/2020 CLINICAL DATA:  [place], hit head EXAM: CT HEAD WITHOUT CONTRAST TECHNIQUE: Contiguous axial images were obtained from the base of the skull through the vertex without intravenous contrast. COMPARISON:  10/21/2019 FINDINGS: Brain: Stable chronic ischemic changes are seen within the right temporoparietal region and throughout the periventricular white matter. No signs of acute infarct or hemorrhage. The lateral ventricles and midline structures are unremarkable. No acute extra-axial fluid collections. No mass effect. Vascular: No hyperdense vessel or unexpected calcification. Skull: Normal. Negative for fracture or focal lesion. Sinuses/Orbits: No acute finding. Other: None. IMPRESSION: 1. No acute intracranial process.  Stable chronic ischemic change. Electronically Signed   By: Randa Ngo M.D.   On: 10/01/2020 22:02   CT Cervical Spine Wo Contrast  Result Date: 10/01/2020 CLINICAL DATA:  [place], hit head EXAM: CT CERVICAL SPINE WITHOUT CONTRAST TECHNIQUE: Multidetector CT imaging of the cervical spine was performed without intravenous contrast. Multiplanar CT image reconstructions were also generated. COMPARISON:  10/21/2019 FINDINGS: Alignment: Alignment is grossly anatomic. Skull base  and vertebrae: No acute fracture. No primary bone lesion or focal pathologic process. Soft tissues and spinal canal: No prevertebral fluid or swelling. No visible canal hematoma. Disc levels: Stable diffuse spondylosis and facet hypertrophy throughout the entirety of the cervical spine. Upper chest: Airway is patent.  Lung apices are clear. Other: Reconstructed images demonstrate no additional findings. IMPRESSION: 1. No acute cervical spine fracture. 2. Stable multilevel cervical spondylosis and facet hypertrophy. Electronically Signed   By: Randa Ngo M.D.   On: 10/01/2020 22:05  DG Hip Unilat With Pelvis 2-3 Views Left  Result Date: 10/01/2020 CLINICAL DATA:  Left hip pain following fall, initial encounter EXAM: DG HIP (WITH OR WITHOUT PELVIS) 3V LEFT COMPARISON:  09/26/2019, 12/21/2017 FINDINGS: Left hip replacement is again seen. Pelvic ring is intact. No acute fracture is seen. Degenerative changes of the lumbar spine are noted. IMPRESSION: Status post left hip replacement.  No acute abnormality is noted. Electronically Signed   By: Inez Catalina M.D.   On: 10/01/2020 21:45    Scheduled Meds: . enoxaparin (LOVENOX) injection  30 mg Subcutaneous Q24H  . levothyroxine  88 mcg Oral QAC breakfast  . memantine  10 mg Oral QHS  . metoprolol tartrate  50 mg Oral BID  . sertraline  25 mg Oral Daily   Continuous Infusions: . sodium chloride 75 mL/hr at 10/03/20 0600     LOS: 0 days   Time spent: 30 minutes   Darliss Cheney, MD [facility]  10/03/2020, 1:09 PM   How to contact the [facility] Attending or Consulting provider [place] or covering provider during after hours [place], for this patient?  1. Check the care team in [facility] and look for a) attending/consulting [facility] provider listed and b) the [facility] team listed. Page or secure chat [phone]. 2. Log into [email or web address] and use [place]'s universal password to access. If you do not have the password, please contact the hospital  operator. 3. Locate the [facility] provider you are looking for under [facility] and page to a number that you can be directly reached. 4. If you still have difficulty reaching the provider, please page the [facility] (Director on Call) for the Hospitalists listed on amion for assistance.

## 2020-10-04 ENCOUNTER — Encounter (HOSPITAL_COMMUNITY): Payer: Self-pay | Admitting: Internal Medicine

## 2020-10-04 DIAGNOSIS — R296 Repeated falls: Secondary | ICD-10-CM

## 2020-10-04 DIAGNOSIS — N179 Acute kidney failure, unspecified: Secondary | ICD-10-CM

## 2020-10-04 DIAGNOSIS — E039 Hypothyroidism, unspecified: Secondary | ICD-10-CM

## 2020-10-04 DIAGNOSIS — R8271 Bacteriuria: Principal | ICD-10-CM

## 2020-10-04 DIAGNOSIS — I10 Essential (primary) hypertension: Secondary | ICD-10-CM

## 2020-10-04 LAB — CBC WITH DIFFERENTIAL/PLATELET
Abs Immature Granulocytes: 0.05 10*3/uL (ref 0.00–0.07)
Basophils Absolute: 0 10*3/uL (ref 0.0–0.1)
Basophils Relative: 1 %
Eosinophils Absolute: 0.1 10*3/uL (ref 0.0–0.5)
Eosinophils Relative: 2 %
HCT: 35 % — ABNORMAL LOW (ref 36.0–46.0)
Hemoglobin: 11.1 g/dL — ABNORMAL LOW (ref 12.0–15.0)
Immature Granulocytes: 1 %
Lymphocytes Relative: 10 %
Lymphs Abs: 0.8 10*3/uL (ref 0.7–4.0)
MCH: 31.8 pg (ref 26.0–34.0)
MCHC: 31.7 g/dL (ref 30.0–36.0)
MCV: 100.3 fL — ABNORMAL HIGH (ref 80.0–100.0)
Monocytes Absolute: 0.6 10*3/uL (ref 0.1–1.0)
Monocytes Relative: 8 %
Neutro Abs: 6.4 10*3/uL (ref 1.7–7.7)
Neutrophils Relative %: 78 %
Platelets: 154 10*3/uL (ref 150–400)
RBC: 3.49 MIL/uL — ABNORMAL LOW (ref 3.87–5.11)
RDW: 12.6 % (ref 11.5–15.5)
WBC: 8 10*3/uL (ref 4.0–10.5)
nRBC: 0 % (ref 0.0–0.2)

## 2020-10-04 LAB — BASIC METABOLIC PANEL
Anion gap: 3 — ABNORMAL LOW (ref 5–15)
BUN: 24 mg/dL — ABNORMAL HIGH (ref 8–23)
CO2: 27 mmol/L (ref 22–32)
Calcium: 8.6 mg/dL — ABNORMAL LOW (ref 8.9–10.3)
Chloride: 112 mmol/L — ABNORMAL HIGH (ref 98–111)
Creatinine, Ser: 1.15 mg/dL — ABNORMAL HIGH (ref 0.44–1.00)
GFR, Estimated: 45 mL/min — ABNORMAL LOW (ref >60)
Glucose, Bld: 118 mg/dL — ABNORMAL HIGH (ref 70–99)
Potassium: 4.4 mmol/L (ref 3.5–5.1)
Sodium: 142 mmol/L (ref 135–145)

## 2020-10-04 LAB — URINE CULTURE: Culture: >=100000 — AB

## 2020-10-04 LAB — PHOSPHORUS: Phosphorus: 2.8 mg/dL (ref 2.5–4.6)

## 2020-10-04 LAB — MAGNESIUM: Magnesium: 1.9 mg/dL (ref 1.7–2.4)

## 2020-10-04 MED ORDER — CYANOCOBALAMIN 1000 MCG/ML IJ SOLN
1000.0000 ug | Freq: Once | INTRAMUSCULAR | Status: AC
  Administered 2020-10-04: 1000 ug via INTRAMUSCULAR
  Filled 2020-10-04: qty 1

## 2020-10-04 NOTE — Progress Notes (Signed)
PROGRESS NOTE    Tiffany [name]  VOZ:[id number] DOB: 11-Apr-1927 DOA: 10/01/2020 PCP: Donald Prose, MD   Brief Narrative:  Is a 87 year old Caucasian female with a past medical history significant for but not limited to, dementia, hypertension, CKD stage IIIa, hypothyroidism, history of skin and breast cancer, history of CVA as well as other comorbidities who presented to the ED from her independent living facility after she had 2 mechanical falls and left hip pain as well as left-sided rib pain.  Patient son was concerned about her increasing confusion as she had an episode where she urinated on the floor and told that she was driving one of her sweaters.  Son was concerned about her ability to care for self and she became increasingly weak.  Patient son is not able to take care of her at home as he is caring for an autistic son himself.  In the ED she was hemodynamically stable and COVID and influenza PCR were negative.  Chest x-ray showed no signs of pneumonia.  X-rays were negative for hip or rib fracture.  Lumbar spine x-ray done showed no acute finding.  She also had a's CT head and spine which were negative for acute finding.  She is admitted with the thought that she is having a UTI and acute encephalopathy from that however her urine did show E. coli and Klebsiella but she was asymptomatic so antibiotics were discontinued.  She is given IV fluid hydration and her AKI is improving and she will be discharged to skilled nursing facility at the recommendations of PT and OT once bed is available.  Assessment & Plan:   Principal Problem:   UTI (urinary tract infection) Active Problems:   HTN (hypertension)   Hypothyroidism   AKI (acute kidney injury) ([place])   Frequent falls  Asymptomatic Bacteruria -UTI ruled out -She denies any Urinary Complaints -Has no Leukocytosis (8.4 -> 8.0) , Fever, or Suprapubic Tenderness -Urine Cx Growing >100,000 CFU of Klebsiella and E Coli  -Dr. Doristine Bosworth  discussed with the son and it was found that hs has been having progressively worsening and sever dementia to the point that she can't operate the TV remote control -Per son she tends to forget most things -Her confusion could be delirium superimposed on dementia -Abx have been discontinued given lack of urinary symptoms  -Continue to Monitor  AKI on CKD Stage 3a, improving  Dehydration -Improving -Patient's BUN/Cr went from 28/1.23 -> 22/1.57 -> 24/1.15 -Currently getting IV fluid hydration with NS at 75 mL/hr -Avoid nephrotoxic medications, contrast dyes, hypotension and renally dose medications -Repeat CMP in the AM   Hyperkalemia  -K+ went from 5.7 -> 4.4 -Continue to Monitor and Trend -Repeat CMP in the AM   Macrocytic Anemia -Patient's Hgb/Hct went from 12.0/37.8 -> 11.1/35.0 -Given a dose of Cyanocobalamin 1000 mcg x1 and start po in the AM as Vitamin B12 was 83 -Continue to Monitor for S/Sx of Bleeding; No overt bleeding noted -Repeat CBC in the AM   Acute Metabolic Encephalopathy/Delirium superimposed on Chronic Dementia -At baseline she is alert and partially oriented -C/w Memantine 10 mg po qhS and Sertraline 25 mg po Daily  -C/w Delirium Precautions  Frequent Falls/Generalized Weakness/Debility  -Seen by PT/OT and recommending SNF -She lives in an ALF and not safe to go back and live by herself -[facility] consulted for assistance with [place]  Hypothyroidism -TSH was 0.318 -C/w Levothyroxine 88 mcg po Daily   Hypertension -BP is mildly elevated -Continue Metoprolol Tartrate 50  mg BID -Continue to Hold Avapro due to AKI   DVT prophylaxis: Enoxaparin 30 mg sq q24h Code Status: DO NOT RESUSCITATE  Family Communication: No family present at bedside  Disposition Plan: SNF once bed is available   Status is: Inpatient  Remains inpatient appropriate because:Unsafe d/c plan, IV treatments appropriate due to intensity of illness or inability to take PO and Inpatient  level of care appropriate due to severity of illness   Dispo: The patient is from: ALF              Anticipated d/c is to: SNF              Patient currently is not medically stable to d/c.   Difficult to place patient No  Consultants:   None  Procedures:   None  Antimicrobials:  Anti-infectives (From admission, onward)   Start     Dose/Rate Route Frequency Ordered Stop   10/03/20 0200  cefTRIAXone (ROCEPHIN) 1 g in sodium chloride 0.9 % 100 mL IVPB  Status:  Discontinued        1 g 200 mL/hr over 30 Minutes Intravenous Every 24 hours 10/02/20 0403 10/02/20 1354   10/02/20 0200  cefTRIAXone (ROCEPHIN) 1 g in sodium chloride 0.9 % 100 mL IVPB        1 g 200 mL/hr over 30 Minutes Intravenous  Once 10/02/20 0145 10/02/20 0436        Subjective: Seen and examined at bedside and she is complaining about some mild abdominal pain.  No nausea or vomiting.  Denies any lightheadedness or dizziness.  Still feels weak.  No other concerns or complaints at this time.  Objective: Vitals:   10/03/20 0557 10/03/20 2156 10/04/20 0541 10/04/20 1318  BP: (!) 157/77 (!) 160/78 (!) 165/72 (!) 164/81  Pulse: 61 61 65 86  Resp: 19 14 14 18   Temp: 98.7 F (37.1 C) 98.5 F (36.9 C) 98.5 F (36.9 C) 99.3 F (37.4 C)  TempSrc: Oral Oral Oral Oral  SpO2: 96% 94% 94% 95%  Weight:      Height:        Intake/Output Summary (Last 24 hours) at 10/04/2020 1504 Last data filed at 10/04/2020 1300 Gross per 24 hour  Intake 480 ml  Output 1100 ml  Net -620 ml   Filed Weights   10/02/20 0538  Weight: 67.1 kg   Examination: Physical Exam:  Constitutional: WN/WD elderly Caucasian female currently in NAD and appears calm and comfortable Eyes: Lids and conjunctivae normal, sclerae anicteric  ENMT: External Ears, Nose appear normal. Grossly normal hearing. Neck: Appears normal, supple, no cervical masses, normal ROM, no appreciable thyromegaly; no JVD Respiratory: Diminished to auscultation  bilaterally with coarse breath sounds, no wheezing, rales, rhonchi or crackles. Normal respiratory effort and patient is not tachypenic. No accessory muscle use.  Cardiovascular: RRR, no murmurs / rubs / gallops. S1 and S2 auscultated.  Minimal extremity edema Abdomen: Soft, non-tender, non-distended. Bowel sounds positive.  GU: Deferred. Musculoskeletal: No clubbing / cyanosis of digits/nails. No joint deformity upper and lower extremities.  Skin: No rashes, lesions, ulcers on limited skin evaluation. No induration; Warm and dry.  Neurologic: CN 2-12 grossly intact with no focal deficits. Romberg sign and cerebellar reflexes not assessed.  Psychiatric: Normal judgment and insight. Alert and oriented x 2. Normal mood and appropriate affect.   Data Reviewed: I have personally reviewed following labs and imaging studies  CBC: Recent Labs  Lab 10/01/20 2210 10/04/20 0955  WBC  8.4 8.0  NEUTROABS 6.7 6.4  HGB 12.0 11.1*  HCT 37.8 35.0*  MCV 100.0 100.3*  PLT 176 161   Basic Metabolic Panel: Recent Labs  Lab 10/01/20 2210 10/02/20 0811 10/03/20 0521 10/03/20 1551 10/04/20 0512  NA 145 144 141  --  142  K 4.0 3.6 5.7* 4.6 4.4  CL 107 110 113*  --  112*  CO2 28 29 23   --  27  GLUCOSE 112* 130* 108*  --  118*  BUN 35* 28* 22  --  24*  CREATININE 1.40* 1.23* 1.57*  --  1.15*  CALCIUM 9.6 8.8* 8.5*  --  8.6*  MG  --   --   --   --  1.9  PHOS  --   --   --   --  2.8   GFR: Estimated Creatinine Clearance: 31 mL/min (A) (by C-G formula based on SCr of 1.15 mg/dL (H)). Liver Function Tests: No results for input(s): AST, ALT, ALKPHOS, BILITOT, PROT, ALBUMIN in the last 168 hours. No results for input(s): LIPASE, AMYLASE in the last 168 hours. Recent Labs  Lab 10/02/20 0811  AMMONIA 14   Coagulation Profile: No results for input(s): INR, PROTIME in the last 168 hours. Cardiac Enzymes: No results for input(s): CKTOTAL, CKMB, CKMBINDEX, TROPONINI in the last 168 hours. BNP (last  3 results) No results for input(s): PROBNP in the last 8760 hours. HbA1C: No results for input(s): HGBA1C in the last 72 hours. CBG: No results for input(s): GLUCAP in the last 168 hours. Lipid Profile: No results for input(s): CHOL, HDL, LDLCALC, TRIG, CHOLHDL, LDLDIRECT in the last 72 hours. Thyroid Function Tests: Recent Labs    10/02/20 0811  TSH 0.318*   Anemia Panel: Recent Labs    10/02/20 0811  VITAMINB12 83*   Sepsis Labs: No results for input(s): PROCALCITON, LATICACIDVEN in the last 168 hours.  Recent Results (from the past 240 hour(s))  Urine culture     Status: Abnormal   Collection Time: 10/01/20 11:45 PM   Specimen: Urine, Catheterized  Result Value Ref Range Status   Specimen Description   Final    URINE, CATHETERIZED Performed at [facility] [address], [place], [place] [address]    Special Requests   Final    NONE Performed at [facility], [place] [address], [place], [place] [address]    Culture (A)  Final    >=100,000 COLONIES/mL KLEBSIELLA PNEUMONIAE >=100,000 COLONIES/mL ESCHERICHIA COLI    Report Status 10/04/2020 FINAL  Final   Organism ID, Bacteria KLEBSIELLA PNEUMONIAE (A)  Final   Organism ID, Bacteria ESCHERICHIA COLI (A)  Final      Susceptibility   Escherichia coli - MIC*    AMPICILLIN 4 SENSITIVE Sensitive     CEFAZOLIN <=4 SENSITIVE Sensitive     CEFEPIME <=0.12 SENSITIVE Sensitive     CEFTRIAXONE <=0.25 SENSITIVE Sensitive     CIPROFLOXACIN >=4 RESISTANT Resistant     GENTAMICIN <=1 SENSITIVE Sensitive     IMIPENEM <=0.25 SENSITIVE Sensitive     NITROFURANTOIN <=16 SENSITIVE Sensitive     TRIMETH/SULFA <=20 SENSITIVE Sensitive     AMPICILLIN/SULBACTAM <=2 SENSITIVE Sensitive     PIP/TAZO <=4 SENSITIVE Sensitive     * >=100,000 COLONIES/mL ESCHERICHIA COLI   Klebsiella pneumoniae - MIC*    AMPICILLIN RESISTANT Resistant     CEFAZOLIN <=4 SENSITIVE Sensitive     CEFEPIME <=0.12  SENSITIVE Sensitive     CEFTRIAXONE <=0.25 SENSITIVE  Sensitive     CIPROFLOXACIN <=0.25 SENSITIVE Sensitive     GENTAMICIN <=1 SENSITIVE Sensitive     IMIPENEM <=0.25 SENSITIVE Sensitive     NITROFURANTOIN <=16 SENSITIVE Sensitive     TRIMETH/SULFA <=20 SENSITIVE Sensitive     AMPICILLIN/SULBACTAM <=2 SENSITIVE Sensitive     PIP/TAZO <=4 SENSITIVE Sensitive     * >=100,000 COLONIES/mL KLEBSIELLA PNEUMONIAE  Resp Panel by RT-PCR (Flu A&B, Covid) Nasopharyngeal Swab     Status: None   Collection Time: 10/02/20  2:27 AM   Specimen: Nasopharyngeal Swab; Nasopharyngeal(NP) swabs in vial transport medium  Result Value Ref Range Status   SARS Coronavirus 2 by RT PCR NEGATIVE NEGATIVE Final    Comment: (NOTE) SARS-CoV-2 target nucleic acids are NOT DETECTED.  The SARS-CoV-2 RNA is generally detectable in upper respiratory specimens during the acute phase of infection. The lowest concentration of SARS-CoV-2 viral copies this assay can detect is 138 copies/mL. A negative result does not preclude SARS-Cov-2 infection and should not be used as the sole basis for treatment or other patient management decisions. A negative result may occur with  improper specimen collection/handling, submission of specimen other than nasopharyngeal swab, presence of viral mutation(s) within the areas targeted by this assay, and inadequate number of viral copies(<138 copies/mL). A negative result must be combined with clinical observations, patient history, and epidemiological information. The expected result is Negative.  Fact Sheet for Patients:  EntrepreneurPulse.com.au  Fact Sheet for Healthcare Providers:  IncredibleEmployment.be  This test is no t yet approved or cleared by the [place] FDA and  has been authorized for detection and/or diagnosis of SARS-CoV-2 by FDA under an Emergency Use Authorization (EUA). This EUA will remain  in effect (meaning this test can  be used) for the duration of the COVID-19 declaration under Section 564(b)(1) of the Act, 21 U.S.C.section 360bbb-3(b)(1), unless the authorization is terminated  or revoked sooner.       Influenza A by PCR NEGATIVE NEGATIVE Final   Influenza B by PCR NEGATIVE NEGATIVE Final    Comment: (NOTE) The Xpert Xpress SARS-CoV-2/FLU/RSV plus assay is intended as an aid in the diagnosis of influenza from Nasopharyngeal swab specimens and should not be used as a sole basis for treatment. Nasal washings and aspirates are unacceptable for Xpert Xpress SARS-CoV-2/FLU/RSV testing.  Fact Sheet for Patients: EntrepreneurPulse.com.au  Fact Sheet for Healthcare Providers: IncredibleEmployment.be  This test is not yet approved or cleared by the [place] FDA and has been authorized for detection and/or diagnosis of SARS-CoV-2 by FDA under an Emergency Use Authorization (EUA). This EUA will remain in effect (meaning this test can be used) for the duration of the COVID-19 declaration under Section 564(b)(1) of the Act, 21 U.S.C. section 360bbb-3(b)(1), unless the authorization is terminated or revoked.  Performed at [facility], [place] [address], [place], [place] [address]      RN Pressure Injury Documentation: Pressure Injury 12/22/17 Deep Tissue Injury - Purple or maroon localized area of discolored intact skin or blood-filled blister due to damage of underlying soft tissue from pressure and/or shear. (Active)  12/22/17 1850  Location: Buttocks  Location Orientation:   Staging: Deep Tissue Injury - Purple or maroon localized area of discolored intact skin or blood-filled blister due to damage of underlying soft tissue from pressure and/or shear.  Wound Description (Comments):   Present on Admission: Yes    Estimated body mass index is 23.17 kg/m as calculated from the following:   Height as of  this encounter: 5\' 7"  (1.702 m).    Weight as of this encounter: 67.1 kg.  Malnutrition Type:   Malnutrition Characteristics:   Nutrition Interventions:     Radiology Studies: No results found.  Scheduled Meds: . enoxaparin (LOVENOX) injection  30 mg Subcutaneous Q24H  . levothyroxine  88 mcg Oral QAC breakfast  . memantine  10 mg Oral QHS  . metoprolol tartrate  50 mg Oral BID  . sertraline  25 mg Oral Daily   Continuous Infusions: . sodium chloride 75 mL/hr at 10/04/20 0948    LOS: 1 day   Kerney Elbe, DO [facility] PAGER is on [place]  If 7PM-7AM, please contact night-coverage [email or web address]

## 2020-10-04 NOTE — TOC Progression Note (Addendum)
Transition of Care [facility]) - Progression Note    Patient Details  Name: [name] MRN: [id number] Date of Birth: 10/29/1926  [facility] CM/SW Contact  Gazelle Towe, Marjie Skiff, RN Phone Number: 10/04/2020, 2:14 PM  Clinical Narrative:    SNF bed offers given to son [name]. He chose [facility]. [facility] liaison alerted of bed acceptance and she has started insurance auth with [employer].  Pasrr:  [phone] [id number]   Expected Discharge Plan: [place] Barriers to Discharge: No Barriers Identified  Expected Discharge Plan and Services Expected Discharge Plan: [place]   Discharge Planning Services: CM Consult   Living arrangements for the past 2 months: [place]                     Readmission Risk Interventions No flowsheet data found.

## 2020-10-05 DIAGNOSIS — W19XXXA Unspecified fall, initial encounter: Secondary | ICD-10-CM | POA: Diagnosis not present

## 2020-10-05 DIAGNOSIS — R2689 Other abnormalities of gait and mobility: Secondary | ICD-10-CM | POA: Diagnosis not present

## 2020-10-05 DIAGNOSIS — R296 Repeated falls: Secondary | ICD-10-CM | POA: Diagnosis not present

## 2020-10-05 DIAGNOSIS — I959 Hypotension, unspecified: Secondary | ICD-10-CM | POA: Diagnosis not present

## 2020-10-05 DIAGNOSIS — Z79899 Other long term (current) drug therapy: Secondary | ICD-10-CM | POA: Diagnosis not present

## 2020-10-05 DIAGNOSIS — I10 Essential (primary) hypertension: Secondary | ICD-10-CM | POA: Diagnosis not present

## 2020-10-05 DIAGNOSIS — I6340 Cerebral infarction due to embolism of unspecified cerebral artery: Secondary | ICD-10-CM | POA: Diagnosis not present

## 2020-10-05 DIAGNOSIS — E039 Hypothyroidism, unspecified: Secondary | ICD-10-CM | POA: Diagnosis not present

## 2020-10-05 DIAGNOSIS — I63511 Cerebral infarction due to unspecified occlusion or stenosis of right middle cerebral artery: Secondary | ICD-10-CM | POA: Diagnosis not present

## 2020-10-05 DIAGNOSIS — N183 Chronic kidney disease, stage 3 unspecified: Secondary | ICD-10-CM | POA: Diagnosis not present

## 2020-10-05 DIAGNOSIS — N390 Urinary tract infection, site not specified: Secondary | ICD-10-CM | POA: Diagnosis not present

## 2020-10-05 DIAGNOSIS — R52 Pain, unspecified: Secondary | ICD-10-CM | POA: Diagnosis not present

## 2020-10-05 DIAGNOSIS — F0390 Unspecified dementia without behavioral disturbance: Secondary | ICD-10-CM | POA: Diagnosis not present

## 2020-10-05 DIAGNOSIS — Z743 Need for continuous supervision: Secondary | ICD-10-CM | POA: Diagnosis not present

## 2020-10-05 DIAGNOSIS — I679 Cerebrovascular disease, unspecified: Secondary | ICD-10-CM | POA: Diagnosis not present

## 2020-10-05 DIAGNOSIS — R295 Transient paralysis: Secondary | ICD-10-CM | POA: Diagnosis not present

## 2020-10-05 DIAGNOSIS — R8271 Bacteriuria: Secondary | ICD-10-CM | POA: Diagnosis not present

## 2020-10-05 DIAGNOSIS — R531 Weakness: Secondary | ICD-10-CM | POA: Diagnosis not present

## 2020-10-05 DIAGNOSIS — N179 Acute kidney failure, unspecified: Secondary | ICD-10-CM | POA: Diagnosis not present

## 2020-10-05 DIAGNOSIS — R5381 Other malaise: Secondary | ICD-10-CM | POA: Diagnosis not present

## 2020-10-05 DIAGNOSIS — Z87440 Personal history of urinary (tract) infections: Secondary | ICD-10-CM | POA: Diagnosis not present

## 2020-10-05 DIAGNOSIS — R69 Illness, unspecified: Secondary | ICD-10-CM | POA: Diagnosis not present

## 2020-10-05 LAB — CBC WITH DIFFERENTIAL/PLATELET
Abs Immature Granulocytes: 0.03 10*3/uL (ref 0.00–0.07)
Basophils Absolute: 0 10*3/uL (ref 0.0–0.1)
Basophils Relative: 0 %
Eosinophils Absolute: 0.1 10*3/uL (ref 0.0–0.5)
Eosinophils Relative: 1 %
HCT: 32.9 % — ABNORMAL LOW (ref 36.0–46.0)
Hemoglobin: 10.5 g/dL — ABNORMAL LOW (ref 12.0–15.0)
Immature Granulocytes: 0 %
Lymphocytes Relative: 12 %
Lymphs Abs: 0.9 10*3/uL (ref 0.7–4.0)
MCH: 31.8 pg (ref 26.0–34.0)
MCHC: 31.9 g/dL (ref 30.0–36.0)
MCV: 99.7 fL (ref 80.0–100.0)
Monocytes Absolute: 0.9 10*3/uL (ref 0.1–1.0)
Monocytes Relative: 11 %
Neutro Abs: 6.1 10*3/uL (ref 1.7–7.7)
Neutrophils Relative %: 76 %
Platelets: 152 10*3/uL (ref 150–400)
RBC: 3.3 MIL/uL — ABNORMAL LOW (ref 3.87–5.11)
RDW: 12.4 % (ref 11.5–15.5)
WBC: 8 10*3/uL (ref 4.0–10.5)
nRBC: 0 % (ref 0.0–0.2)

## 2020-10-05 LAB — COMPREHENSIVE METABOLIC PANEL
ALT: 11 U/L (ref 0–44)
AST: 15 U/L (ref 15–41)
Albumin: 2.9 g/dL — ABNORMAL LOW (ref 3.5–5.0)
Alkaline Phosphatase: 64 U/L (ref 38–126)
Anion gap: 5 (ref 5–15)
BUN: 20 mg/dL (ref 8–23)
CO2: 25 mmol/L (ref 22–32)
Calcium: 8.9 mg/dL (ref 8.9–10.3)
Chloride: 110 mmol/L (ref 98–111)
Creatinine, Ser: 1.15 mg/dL — ABNORMAL HIGH (ref 0.44–1.00)
GFR, Estimated: 45 mL/min — ABNORMAL LOW (ref >60)
Glucose, Bld: 129 mg/dL — ABNORMAL HIGH (ref 70–99)
Potassium: 4.2 mmol/L (ref 3.5–5.1)
Sodium: 140 mmol/L (ref 135–145)
Total Bilirubin: 1 mg/dL (ref 0.3–1.2)
Total Protein: 5.6 g/dL — ABNORMAL LOW (ref 6.5–8.1)

## 2020-10-05 LAB — MAGNESIUM: Magnesium: 1.9 mg/dL (ref 1.7–2.4)

## 2020-10-05 LAB — PHOSPHORUS: Phosphorus: 3.2 mg/dL (ref 2.5–4.6)

## 2020-10-05 MED ORDER — VITAMIN B-12 1000 MCG PO TABS
1000.0000 ug | ORAL_TABLET | Freq: Every day | ORAL | Status: DC
  Administered 2020-10-05: 1000 ug via ORAL
  Filled 2020-10-05: qty 1

## 2020-10-05 MED ORDER — MAGNESIUM SULFATE IN D5W 1-5 GM/100ML-% IV SOLN
1.0000 g | Freq: Once | INTRAVENOUS | Status: AC
  Administered 2020-10-05: 1 g via INTRAVENOUS
  Filled 2020-10-05: qty 100

## 2020-10-05 MED ORDER — FUROSEMIDE 40 MG PO TABS: 40.0000 mg | ORAL_TABLET | Freq: Every day | ORAL | Status: DC

## 2020-10-05 MED ORDER — OXYCODONE HCL 5 MG PO TABS: 5.0000 mg | ORAL_TABLET | Freq: Four times a day (QID) | ORAL | 0 refills | Status: DC | PRN

## 2020-10-05 MED ORDER — CYANOCOBALAMIN 1000 MCG PO TABS: 1000.0000 ug | ORAL_TABLET | Freq: Every day | ORAL | 0 refills | Status: DC

## 2020-10-05 MED ORDER — CEPHALEXIN 500 MG PO CAPS: 500.0000 mg | ORAL_CAPSULE | Freq: Three times a day (TID) | ORAL | 0 refills | Status: AC

## 2020-10-05 MED ORDER — SODIUM CHLORIDE 0.9 % IV SOLN
1.0000 g | Freq: Every day | INTRAVENOUS | Status: DC
  Administered 2020-10-05: 1 g via INTRAVENOUS
  Filled 2020-10-05: qty 1

## 2020-10-05 NOTE — Progress Notes (Signed)
Occupational Therapy Treatment Patient Details Name: Tiffany [name] MRN: [id number] DOB: 1926/08/27 Today's Date: 10/05/2020    History of present illness 87 y.o. female admitted with UTI, falls at home, L hip pain, L side posterior back pain, FTT. History significant of dementia, hypertension, CKD stage IIIa, hypothyroidism, history of skin and breast cancer, CVA.   OT comments  Decline in overall endurance and patient reporting pain in ribs, L hip and lower back with mobility. Patient needing max A for bed mobility as well as stand pivot with walker to bedside commode. Patient with R lateral lean during transfer and in sitting on commode, possibly due to L hip pain. Unable to safely stand pivot to recliner after bedside as patient attempting to sit before aligned with chair and leaning to R therefore used sara stedy. Continue to recommend ST rehab at D/C.    Follow Up Recommendations  SNF    Equipment Recommendations  None recommended by OT       Precautions / Restrictions Precautions Precautions: Fall       Mobility Bed Mobility Overal bed mobility: Needs Assistance Bed Mobility: Rolling;Sidelying to Sit Rolling: Max assist Sidelying to sit: Max assist;HOB elevated       General bed mobility comments: max cues to sequence and instruct patient to hug pillow to try and alleviate rib pain    Transfers Overall transfer level: Needs assistance Equipment used: Rolling walker (2 wheeled) Transfers: Sit to/from [facility] Sit to Stand: Max assist Stand pivot transfers: Max assist       General transfer comment: needs multimodal cues for body mechanics, limited ability to extend at hips, and leaning towards right. max A with walker to bedside commode then stedy to recliner    Balance Overall balance assessment: Needs assistance;History of Falls Sitting-balance support: Feet supported Sitting balance-Leahy Scale: Poor Sitting balance - Comments: reliant on  UE   Standing balance support: Bilateral upper extremity supported Standing balance-Leahy Scale: Zero Standing balance comment: max A and support of walker                           ADL either performed or assessed with clinical judgement   ADL Overall ADL's : Needs assistance/impaired             Lower Body Bathing: Total assistance;Sitting/lateral leans Lower Body Bathing Details (indicate cue type and reason): to wash feet after incontinent of urine     Lower Body Dressing: Total assistance;Sitting/lateral leans Lower Body Dressing Details (indicate cue type and reason): to doff soiled socks and don clean pair Toilet Transfer: Maximal assistance;Cueing for safety;Cueing for sequencing;Stand-pivot;RW Toilet Transfer Details (indicate cue type and reason): patient needing max A to stand pivot from edge of bed to bedside commode. attempted stand pivot to recliner after however patient unsafe trying to sit before near chair and leaning toward R. had to use stedy to safely transfer patient to [place] and Hygiene: Maximal assistance;Sitting/lateral lean;Sit to/from stand Toileting - Clothing Manipulation Details (indicate cue type and reason): able to perform peri care seated on toilet, total A for clothing management     Functional mobility during ADLs: Maximal assistance;Cueing for safety;Cueing for sequencing General ADL Comments: significant decline in mobility and selfcare from eval, may be due to pain, high fall risk               Cognition Arousal/Alertness: Awake/alert Behavior During Therapy: WFL for tasks assessed/performed Overall Cognitive  Status: No family/caregiver present to determine baseline cognitive functioning                                 General Comments: patient knows she is in [facility] hospital, poor safety awareness trying to go opposite direction of recliner during stand pivot and asking if she can  sit with no chair behind her                   Pertinent Vitals/ Pain       Pain Assessment: Faces Faces Pain Scale: Hurts even more Pain Location: Left posterior back, L hip, ribs Pain Descriptors / Indicators: Discomfort;Sore Pain Intervention(s): Monitored during session;Patient requesting pain meds-RN notified         Frequency  Min 2X/week        Progress Toward Goals  OT Goals(current goals can now be found in the care plan section)  Progress towards OT goals: Not progressing toward goals - comment (pain, weakness)  Acute Rehab OT Goals Patient Stated Goal: return to independent living OT Goal Formulation: With patient Time For Goal Achievement: 10/16/20 Potential to Achieve Goals: Good ADL Goals Pt Will Perform Lower Body Dressing: with supervision;sit to/from stand Pt Will Transfer to Toilet: with supervision;ambulating;regular height toilet;grab bars Pt Will Perform Toileting - Clothing Manipulation and hygiene: with supervision;sit to/from stand Additional ADL Goal #1: Patient will stand at sink to perform 3 min grooming task as evidence of improving activity tolerance  Plan Discharge plan remains appropriate       AM-PAC OT "6 Clicks" Daily Activity     Outcome Measure   Help from another person eating meals?: A Little Help from another person taking care of personal grooming?: A Little Help from another person toileting, which includes using toliet, bedpan, or urinal?: Total Help from another person bathing (including washing, rinsing, drying)?: A Lot Help from another person to put on and taking off regular upper body clothing?: A Little Help from another person to put on and taking off regular lower body clothing?: Total 6 Click Score: 13    End of Session Equipment Utilized During Treatment: Rolling walker  OT Visit Diagnosis: Unsteadiness on feet (R26.81);Other abnormalities of gait and mobility (R26.89);Pain;Muscle weakness (generalized)  (M62.81) Pain - Right/Left: Left Pain - part of body: Hip;Leg   Activity Tolerance Patient limited by pain   Patient Left in chair;with call bell/phone within reach;with chair alarm set   Nurse Communication Mobility status;Need for lift equipment        Time: 5056-9794 OT Time Calculation (min): 38 min  Charges: OT General Charges $OT Visit: 1 Visit OT Treatments $Self Care/Home Management : 38-52 mins  Delbert Phenix OT OT pager: [phone]   Rosemary Holms 10/05/2020, 10:09 AM

## 2020-10-05 NOTE — Progress Notes (Signed)
Physical Therapy Treatment Patient Details Name: Tiffany [name] MRN: [id number] DOB: January 21, 1927 Today's Date: 10/05/2020    History of Present Illness 87 y.o. female admitted with UTI, falls at home, L hip pain, L side posterior back pain, FTT. History significant of dementia, hypertension, CKD stage IIIa, hypothyroidism, history of skin and breast cancer, CVA.    PT Comments    Pt in recliner on arrival.  Pt assisted with performing sit to stands for technique and strengthening.  Pt able to stand fully upright upon third attempt however fatigued very quickly.  Continue to recommend SNF upon d/c.   Follow Up Recommendations  SNF     Equipment Recommendations  None recommended by PT    Recommendations for Other Services       Precautions / Restrictions Precautions Precautions: Fall    Mobility  Bed Mobility Overal bed mobility: Needs Assistance Bed Mobility: Rolling;Sidelying to Sit Rolling: Max assist Sidelying to sit: Max assist;HOB elevated       General bed mobility comments: max cues to sequence and instruct patient to hug pillow to try and alleviate rib pain    Transfers Overall transfer level: Needs assistance Equipment used: Rolling walker (2 wheeled) Transfers: Sit to/from Stand Sit to Stand: Mod assist Stand pivot transfers: Max assist       General transfer comment: verbal cues for hand placement and weight shifting, pt able to clear buttocks from sitting surface however not extend upright with first and second attempts, upon third attempt, pt able to rise standing upright with min assist; fatigued quickly and required brief rest break after each attempt  Ambulation/Gait                 Stairs             Wheelchair Mobility    Modified Rankin (Stroke Patients Only)       Balance Overall balance assessment: Needs assistance;History of Falls Sitting-balance support: Feet supported;Single extremity supported Sitting balance-Leahy  Scale: Poor Sitting balance - Comments: reliant on UE   Standing balance support: Bilateral upper extremity supported Standing balance-Leahy Scale: Poor Standing balance comment: reliant on UE support                            Cognition Arousal/Alertness: Awake/alert Behavior During Therapy: WFL for tasks assessed/performed Overall Cognitive Status: No family/caregiver present to determine baseline cognitive functioning                                 General Comments: progressing dementia per MD notes      Exercises      General Comments        Pertinent Vitals/Pain Pain Assessment: Faces Faces Pain Scale: Hurts little more Pain Location: left ribs Pain Descriptors / Indicators: Discomfort;Sore Pain Intervention(s): Repositioned;Monitored during session    Home Living                      Prior Function            PT Goals (current goals can now be found in the care plan section) Acute Rehab PT Goals Patient Stated Goal: return to independent living Progress towards PT goals: Progressing toward goals    Frequency    Min 2X/week      PT Plan Current plan remains appropriate    Co-evaluation  AM-PAC PT "6 Clicks" Mobility   Outcome Measure  Help needed turning from your back to your side while in a flat bed without using bedrails?: A Little Help needed moving from lying on your back to sitting on the side of a flat bed without using bedrails?: A Lot Help needed moving to and from a bed to a chair (including a wheelchair)?: A Lot Help needed standing up from a chair using your arms (e.g., wheelchair or bedside chair)?: A Lot Help needed to walk in hospital room?: A Lot Help needed climbing 3-5 steps with a railing? : Total 6 Click Score: 12    End of Session Equipment Utilized During Treatment: Gait belt Activity Tolerance: Patient tolerated treatment well Patient left: in chair;with call bell/phone  within reach;with chair alarm set   PT Visit Diagnosis: Muscle weakness (generalized) (M62.81);Difficulty in walking, not elsewhere classified (R26.2)     Time: 3142-7670 PT Time Calculation (min) (ACUTE ONLY): 13 min  Charges:  $Therapeutic Activity: 8-22 mins                    Jannette Spanner PT, DPT Acute Rehabilitation Services Pager: [phone] Office: [phone]  York Ram E 10/05/2020, 12:44 PM

## 2020-10-05 NOTE — TOC Transition Note (Signed)
Transition of Care [facility]) - CM/SW Discharge Note   Patient Details  Name: Reniah Cottingham MRN: [id number] Date of Birth: [birth year]  [facility] of Care [facility] CM/SW Contact:  [name], RN Phone Number: 10/05/2020, 12:39 PM   Clinical Narrative:    Damaris Schooner with Helene Kelp at [place] who has received insurance auth from [employer]. Pt to dc to [facility] today. [facility] to transport pt to room 106 at [facility]. Yellow DNR and Most form on the chart for transport. RN to call report to [phone].   Final next level of care: Skilled Nursing Facility Barriers to Discharge: No Barriers Identified   Patient Goals and CMS Choice   CMS Medicare.gov Compare Post Acute Care list provided to:: Patient Represenative (must comment) (Son [name]) Choice offered to / list presented to : Adult Children  Discharge Placement              Patient chooses bed at: Other - please specify in the comment section below: ([facility]) Patient to be transferred to facility by: [place] Name of family member notified: Son [name] Patient and family notified of of transfer: 10/05/20  Discharge Plan and Services   Discharge Planning Services: CM Consult                                 Social Determinants of Health (SDOH) Interventions     Readmission Risk Interventions No flowsheet data found.

## 2020-10-05 NOTE — Discharge Summary (Signed)
Physician Discharge Summary  Tiffany [name] YTK:[id number] DOB: [birth year] DOA: 10/01/2020  PCP: Donald Prose, MD  Admit date: 10/01/2020 Discharge date: 10/05/2020  Admitted From: Home Disposition: SNF  Recommendations for Outpatient Follow-up:  1. Follow up with PCP in 1-2 weeks 2. Please obtain CMP/CBC, Mag, Phos in one week 3. Please follow up on the following pending results:  Home Health: None Equipment/Devices: None  Discharge Condition: Stable CODE STATUS: DO NOT RESUSCITATE  Diet recommendation: Heart Healthy Diet   Brief/Interim Summary: The patient Is a 87 year old Caucasian female with a past medical history significant for but not limited to, dementia, hypertension, CKD stage IIIa, hypothyroidism, history of skin and breast cancer, history of CVA as well as other comorbidities who presented to the ED from her independent living facility after she had 2 mechanical falls and left hip pain as well as left-sided rib pain.  Patient son was concerned about her increasing confusion as she had an episode where she urinated on the floor and told that she was driving one of her sweaters.  Son was concerned about her ability to care for self and she became increasingly weak.  Patient son is not able to take care of her at home as he is caring for an autistic son himself.  In the ED she was hemodynamically stable and COVID and influenza PCR were negative.  Chest x-ray showed no signs of pneumonia.  X-rays were negative for hip or rib fracture.  Lumbar spine x-ray done showed no acute finding.  She also had a's CT head and spine which were negative for acute finding.  She is admitted with the thought that she is having a UTI and acute encephalopathy from that however her urine did show E. coli and Klebsiella but she was asymptomatic so antibiotics were discontinued.  She is given IV fluid hydration and her AKI is improving and she will be discharged to skilled nursing facility at the  recommendations of PT and OT once bed is available.  The night she spiked a temperature so she is started on IV antibiotics and changed to p.o.  She is been deemed medically stable to be discharged now that she has a bed available.  She will need to follow-up with her PCP in 1 to 2 weeks.  Discharge Diagnoses:  Principal Problem:   UTI (urinary tract infection) Active Problems:   HTN (hypertension)   Hypothyroidism   AKI (acute kidney injury) ([place])   Frequent falls  Asymptomatic Bacteruria with Concern for Suspected UTI -UTI initially ruled out but she had a temperature of 100.8 yesterday -She denies any Urinary Complaints -Has no Leukocytosis (8.4 -> 8.0) , or Suprapubic Tenderness but did spike a temperature -Urine Cx Growing >100,000 CFU of Klebsiella and E Coli  -Dr. Doristine Bosworth discussed with the son and it was found that hs has been having progressively worsening and sever dementia to the point that she can't operate the TV remote control -Per son she tends to forget most things -Her confusion could be delirium superimposed on dementia -Abx had been discontinued given lack of urinary symptoms but will resume because of slight fever and treat empirically for 5 days -Started IV Ceftriaxone and will transition to po Keflex for 5 days total  -Continue to Monitor  AKI on CKD Stage 3a, improving  Dehydration -Improving -Patient's BUN/Cr went from 28/1.23 -> 22/1.57 -> 24/1.15 -> 20/1.16 -Currently getting IV fluid hydration with NS at 75 mL/hr and will stop at D/C -Avoid nephrotoxic medications, contrast  dyes, hypotension and renally dose medications -Repeat CMP in the AM   Hyperkalemia  -K+ went from 5.7 -> 4.4 -> 4.2 -Continue to Monitor and Trend -Repeat CMP in the AM   Macrocytic Anemia -Patient's Hgb/Hct went from 12.0/37.8 -> 11.1/35.0 -> 10.5/32.9 -Given a dose of Cyanocobalamin 1000 mcg x1 and start po in the AM as Vitamin B12 was 83 -Continue to Monitor for S/Sx of  Bleeding; No overt bleeding noted -Repeat CBC in the AM   Acute Metabolic Encephalopathy/Delirium superimposed on Chronic Dementia -At baseline she is alert and partially oriented -C/w Memantine 10 mg po qhS and Sertraline 25 mg po Daily  -C/w Delirium Precautions  Frequent Falls/Generalized Weakness/Debility  -Seen by PT/OT and recommending SNF -She lives in an ALF and not safe to go back and live by herself -[facility] consulted for assistance with Placement and she has bed today   Hypothyroidism -TSH was 0.318 -C/w Levothyroxine 88 mcg po Daily   Hypertension -BP is elevated at 178/61 -Continue Metoprolol Tartrate 50 mg BID -Continued to Hold Avapro due to AKI but will resume at D/C along with Lasix but at half the dose.  Discharge Instructions  Discharge Instructions    Call MD for:  difficulty breathing, headache or visual disturbances   Complete by: As directed    Call MD for:  extreme fatigue   Complete by: As directed    Call MD for:  hives   Complete by: As directed    Call MD for:  persistant dizziness or light-headedness   Complete by: As directed    Call MD for:  persistant nausea and vomiting   Complete by: As directed    Call MD for:  redness, tenderness, or signs of infection (pain, swelling, redness, odor or green/yellow discharge around incision site)   Complete by: As directed    Call MD for:  severe uncontrolled pain   Complete by: As directed    Call MD for:  temperature >100.4   Complete by: As directed    Diet - low sodium heart healthy   Complete by: As directed    Discharge instructions   Complete by: As directed    You were cared for by a hospitalist during your hospital stay. If you have any questions about your discharge medications or the care you received while you were in the hospital after you are discharged, you can call the unit and ask to speak with the hospitalist on call if the hospitalist that took care of you is not available. Once you  are discharged, your primary care physician will handle any further medical issues. Please note that NO REFILLS for any discharge medications will be authorized once you are discharged, as it is imperative that you return to your primary care physician (or establish a relationship with a primary care physician if you do not have one) for your aftercare needs so that they can reassess your need for medications and monitor your lab values.  Follow up with PCP within 1-2 weeks. Take all medications as prescribed. If symptoms change or worsen please return to the ED for evaluation   Increase activity slowly   Complete by: As directed      Allergies as of 10/05/2020      Reactions   Codeine Nausea Only   Morphine And Related Anxiety, Other (See Comments)   Feel very unwell and out there   Sulfa Antibiotics Hives, Itching      Medication List  STOP taking these medications   aspirin 81 MG EC tablet     TAKE these medications   acetaminophen 500 MG tablet Commonly known as: TYLENOL Take 1,000 mg by mouth in the morning and at bedtime.   cephALEXin 500 MG capsule Commonly known as: KEFLEX Take 1 capsule (500 mg total) by mouth 3 (three) times daily for 4 days.   cyanocobalamin 1000 MCG tablet Take 1 tablet (1,000 mcg total) by mouth daily.   furosemide 40 MG tablet Commonly known as: LASIX Take 1 tablet (40 mg total) by mouth daily. What changed:   medication strength  how much to take   irbesartan 300 MG tablet Commonly known as: AVAPRO Take 300 mg by mouth every morning.   levothyroxine 88 MCG tablet Commonly known as: SYNTHROID Take 88 mcg by mouth daily before breakfast.   memantine 10 MG tablet Commonly known as: NAMENDA Take 10 mg by mouth at bedtime.   metoprolol tartrate 50 MG tablet Commonly known as: LOPRESSOR Take 50 mg by mouth 2 (two) times daily.   oxyCODONE 5 MG immediate release tablet Commonly known as: Oxy IR/ROXICODONE Take 1 tablet (5 mg total)  by mouth every 6 (six) hours as needed for breakthrough pain.   pantoprazole 20 MG tablet Commonly known as: PROTONIX Take 1 tablet (20 mg total) by mouth daily.   sertraline 25 MG tablet Commonly known as: ZOLOFT Take 25 mg by mouth daily.   triamcinolone cream 0.1 % Commonly known as: KENALOG Apply 1 application topically 2 (two) times daily.       Follow-up Information    Donald Prose, MD. Call.   Specialty: Family Medicine Why: Follow up within 1-2 weeks  Contact information: [address], [address] [place] [address] [phone]              Allergies  Allergen Reactions  . Codeine Nausea Only  . Morphine And Related Anxiety and Other (See Comments)    Feel very unwell and out there  . Sulfa Antibiotics Hives and Itching   Consultations:  None  Procedures/Studies: DG Ribs Unilateral W/Chest Left  Result Date: 10/01/2020 CLINICAL DATA:  Left-sided rib pain following fall, initial encounter EXAM: LEFT RIBS AND CHEST - 3+ VIEW COMPARISON:  09/26/2019 FINDINGS: Cardiac shadow is at the upper limits of normal in size. Aortic calcifications are noted. The lungs are well aerated bilaterally. Chronic elevation of left hemidiaphragm is seen. No pneumothorax is noted. No acute rib fracture is noted. IMPRESSION: No acute rib fracture is seen. Electronically Signed   By: Inez Catalina M.D.   On: 10/01/2020 21:46   DG Lumbar Spine Complete  Result Date: 10/01/2020 CLINICAL DATA:  [place], left hip pain EXAM: LUMBAR SPINE - COMPLETE 4+ VIEW COMPARISON:  12/21/2017 FINDINGS: Frontal, bilateral oblique, and lateral views of the lumbar spine are obtained. Five non-rib-bearing lumbar type vertebral bodies are again identified with severe right convex scoliosis centered at the thoracolumbar junction, stable. Bones are diffusely osteopenic. Stable L1 compression deformity. Stable degenerative changes surrounding the L3/L4 disc space, with invagination of the inferior L3  endplate and superior L4 endplate. Diffuse multilevel spondylosis and facet hypertrophy, greatest at the thoracolumbar junction, grossly unchanged. Diffuse vascular calcifications are again noted. IMPRESSION: 1. Stable appearance of the lumbar spine with multilevel degenerative changes and chronic compression deformities as above. No acute bony abnormality. Electronically Signed   By: Randa Ngo M.D.   On: 10/01/2020 23:09   CT Head Wo Contrast  Result  Date: 10/01/2020 CLINICAL DATA:  [place], hit head EXAM: CT HEAD WITHOUT CONTRAST TECHNIQUE: Contiguous axial images were obtained from the base of the skull through the vertex without intravenous contrast. COMPARISON:  10/21/2019 FINDINGS: Brain: Stable chronic ischemic changes are seen within the right temporoparietal region and throughout the periventricular white matter. No signs of acute infarct or hemorrhage. The lateral ventricles and midline structures are unremarkable. No acute extra-axial fluid collections. No mass effect. Vascular: No hyperdense vessel or unexpected calcification. Skull: Normal. Negative for fracture or focal lesion. Sinuses/Orbits: No acute finding. Other: None. IMPRESSION: 1. No acute intracranial process.  Stable chronic ischemic change. Electronically Signed   By: Randa Ngo M.D.   On: 10/01/2020 22:02   CT Cervical Spine Wo Contrast  Result Date: 10/01/2020 CLINICAL DATA:  [place], hit head EXAM: CT CERVICAL SPINE WITHOUT CONTRAST TECHNIQUE: Multidetector CT imaging of the cervical spine was performed without intravenous contrast. Multiplanar CT image reconstructions were also generated. COMPARISON:  10/21/2019 FINDINGS: Alignment: Alignment is grossly anatomic. Skull base and vertebrae: No acute fracture. No primary bone lesion or focal pathologic process. Soft tissues and spinal canal: No prevertebral fluid or swelling. No visible canal hematoma. Disc levels: Stable diffuse spondylosis and facet hypertrophy throughout the  entirety of the cervical spine. Upper chest: Airway is patent.  Lung apices are clear. Other: Reconstructed images demonstrate no additional findings. IMPRESSION: 1. No acute cervical spine fracture. 2. Stable multilevel cervical spondylosis and facet hypertrophy. Electronically Signed   By: Randa Ngo M.D.   On: 10/01/2020 22:05   DG Hip Unilat With Pelvis 2-3 Views Left  Result Date: 10/01/2020 CLINICAL DATA:  Left hip pain following fall, initial encounter EXAM: DG HIP (WITH OR WITHOUT PELVIS) 3V LEFT COMPARISON:  09/26/2019, 12/21/2017 FINDINGS: Left hip replacement is again seen. Pelvic ring is intact. No acute fracture is seen. Degenerative changes of the lumbar spine are noted. IMPRESSION: Status post left hip replacement.  No acute abnormality is noted. Electronically Signed   By: Inez Catalina M.D.   On: 10/01/2020 21:45   Subjective: Seen and examined at bedside and was complaining about pain but states that she just took her pain pill.  Thinks that she slept okay.  No nausea or vomiting.  No other concerns or complaint this time and felt well.   Discharge Exam: Vitals:   10/04/20 2112 10/05/20 0534  BP: (!) 154/73 (!) 178/61  Pulse: 70 60  Resp: 16 16  Temp: (!) 100.8 F (38.2 C) 98.8 F (37.1 C)  SpO2: 95% 96%   Vitals:   10/04/20 0541 10/04/20 1318 10/04/20 2112 10/05/20 0534  BP: (!) 165/72 (!) 164/81 (!) 154/73 (!) 178/61  Pulse: 65 86 70 60  Resp: 14 18 16 16   Temp: 98.5 F (36.9 C) 99.3 F (37.4 C) (!) 100.8 F (38.2 C) 98.8 F (37.1 C)  TempSrc: Oral Oral Oral Oral  SpO2: 94% 95% 95% 96%  Weight:      Height:       General: Pt is alert, awake, not in acute distress Cardiovascular: RRR, S1/S2 +, no rubs, no gallops Respiratory: Slightly diminished bilaterally with coarse breath sounds, no wheezing, no rhonchi Abdominal: Soft, NT, ND, bowel sounds + Extremities: Trace edema, no cyanosis  The results of significant diagnostics from this hospitalization  (including imaging, microbiology, ancillary and laboratory) are listed below for reference.    Microbiology: Recent Results (from the past 240 hour(s))  Urine culture     Status: Abnormal  Collection Time: 10/01/20 11:45 PM   Specimen: Urine, Catheterized  Result Value Ref Range Status   Specimen Description   Final    URINE, CATHETERIZED Performed at [facility] [address], [place], [place] [address]    Special Requests   Final    NONE Performed at [facility], [place] [address], [place], [place] [address]    Culture (A)  Final    >=100,000 COLONIES/mL KLEBSIELLA PNEUMONIAE >=100,000 COLONIES/mL ESCHERICHIA COLI    Report Status 10/04/2020 FINAL  Final   Organism ID, Bacteria KLEBSIELLA PNEUMONIAE (A)  Final   Organism ID, Bacteria ESCHERICHIA COLI (A)  Final      Susceptibility   Escherichia coli - MIC*    AMPICILLIN 4 SENSITIVE Sensitive     CEFAZOLIN <=4 SENSITIVE Sensitive     CEFEPIME <=0.12 SENSITIVE Sensitive     CEFTRIAXONE <=0.25 SENSITIVE Sensitive     CIPROFLOXACIN >=4 RESISTANT Resistant     GENTAMICIN <=1 SENSITIVE Sensitive     IMIPENEM <=0.25 SENSITIVE Sensitive     NITROFURANTOIN <=16 SENSITIVE Sensitive     TRIMETH/SULFA <=20 SENSITIVE Sensitive     AMPICILLIN/SULBACTAM <=2 SENSITIVE Sensitive     PIP/TAZO <=4 SENSITIVE Sensitive     * >=100,000 COLONIES/mL ESCHERICHIA COLI   Klebsiella pneumoniae - MIC*    AMPICILLIN RESISTANT Resistant     CEFAZOLIN <=4 SENSITIVE Sensitive     CEFEPIME <=0.12 SENSITIVE Sensitive     CEFTRIAXONE <=0.25 SENSITIVE Sensitive     CIPROFLOXACIN <=0.25 SENSITIVE Sensitive     GENTAMICIN <=1 SENSITIVE Sensitive     IMIPENEM <=0.25 SENSITIVE Sensitive     NITROFURANTOIN <=16 SENSITIVE Sensitive     TRIMETH/SULFA <=20 SENSITIVE Sensitive     AMPICILLIN/SULBACTAM <=2 SENSITIVE Sensitive     PIP/TAZO <=4 SENSITIVE Sensitive     * >=100,000 COLONIES/mL KLEBSIELLA PNEUMONIAE  Resp  Panel by RT-PCR (Flu A&B, Covid) Nasopharyngeal Swab     Status: None   Collection Time: 10/02/20  2:27 AM   Specimen: Nasopharyngeal Swab; Nasopharyngeal(NP) swabs in vial transport medium  Result Value Ref Range Status   SARS Coronavirus 2 by RT PCR NEGATIVE NEGATIVE Final    Comment: (NOTE) SARS-CoV-2 target nucleic acids are NOT DETECTED.  The SARS-CoV-2 RNA is generally detectable in upper respiratory specimens during the acute phase of infection. The lowest concentration of SARS-CoV-2 viral copies this assay can detect is 138 copies/mL. A negative result does not preclude SARS-Cov-2 infection and should not be used as the sole basis for treatment or other patient management decisions. A negative result may occur with  improper specimen collection/handling, submission of specimen other than nasopharyngeal swab, presence of viral mutation(s) within the areas targeted by this assay, and inadequate number of viral copies(<138 copies/mL). A negative result must be combined with clinical observations, patient history, and epidemiological information. The expected result is Negative.  Fact Sheet for Patients:  EntrepreneurPulse.com.au  Fact Sheet for Healthcare Providers:  IncredibleEmployment.be  This test is no t yet approved or cleared by the [place] FDA and  has been authorized for detection and/or diagnosis of SARS-CoV-2 by FDA under an Emergency Use Authorization (EUA). This EUA will remain  in effect (meaning this test can be used) for the duration of the COVID-19 declaration under Section 564(b)(1) of the Act, 21 U.S.C.section 360bbb-3(b)(1), unless the authorization is terminated  or revoked sooner.       Influenza A by PCR NEGATIVE NEGATIVE Final   Influenza B  by PCR NEGATIVE NEGATIVE Final    Comment: (NOTE) The Xpert Xpress SARS-CoV-2/FLU/RSV plus assay is intended as an aid in the diagnosis of influenza from Nasopharyngeal  swab specimens and should not be used as a sole basis for treatment. Nasal washings and aspirates are unacceptable for Xpert Xpress SARS-CoV-2/FLU/RSV testing.  Fact Sheet for Patients: EntrepreneurPulse.com.au  Fact Sheet for Healthcare Providers: IncredibleEmployment.be  This test is not yet approved or cleared by the [place] FDA and has been authorized for detection and/or diagnosis of SARS-CoV-2 by FDA under an Emergency Use Authorization (EUA). This EUA will remain in effect (meaning this test can be used) for the duration of the COVID-19 declaration under Section 564(b)(1) of the Act, 21 U.S.C. section 360bbb-3(b)(1), unless the authorization is terminated or revoked.  Performed at [facility], [place] [address] [place], [place] [address]     Labs: BNP (last 3 results) No results for input(s): BNP in the last 8760 hours. Basic Metabolic Panel: Recent Labs  Lab 10/01/20 2210 10/02/20 0811 10/03/20 0521 10/03/20 1551 10/04/20 0512 10/05/20 0542  NA 145 144 141  --  142 140  K 4.0 3.6 5.7* 4.6 4.4 4.2  CL 107 110 113*  --  112* 110  CO2 28 29 23   --  27 25  GLUCOSE 112* 130* 108*  --  118* 129*  BUN 35* 28* 22  --  24* 20  CREATININE 1.40* 1.23* 1.57*  --  1.15* 1.15*  CALCIUM 9.6 8.8* 8.5*  --  8.6* 8.9  MG  --   --   --   --  1.9 1.9  PHOS  --   --   --   --  2.8 3.2   Liver Function Tests: Recent Labs  Lab 10/05/20 0542  AST 15  ALT 11  ALKPHOS 64  BILITOT 1.0  PROT 5.6*  ALBUMIN 2.9*   No results for input(s): LIPASE, AMYLASE in the last 168 hours. Recent Labs  Lab 10/02/20 0811  AMMONIA 14   CBC: Recent Labs  Lab 10/01/20 2210 10/04/20 0955 10/05/20 0542  WBC 8.4 8.0 8.0  NEUTROABS 6.7 6.4 6.1  HGB 12.0 11.1* 10.5*  HCT 37.8 35.0* 32.9*  MCV 100.0 100.3* 99.7  PLT 176 154 152   Cardiac Enzymes: No results for input(s): CKTOTAL, CKMB, CKMBINDEX, TROPONINI in the last 168  hours. BNP: Invalid input(s): POCBNP CBG: No results for input(s): GLUCAP in the last 168 hours. D-Dimer No results for input(s): DDIMER in the last 72 hours. Hgb A1c No results for input(s): HGBA1C in the last 72 hours. Lipid Profile No results for input(s): CHOL, HDL, LDLCALC, TRIG, CHOLHDL, LDLDIRECT in the last 72 hours. Thyroid function studies No results for input(s): TSH, T4TOTAL, T3FREE, THYROIDAB in the last 72 hours.  Invalid input(s): FREET3 Anemia work up No results for input(s): VITAMINB12, FOLATE, FERRITIN, TIBC, IRON, RETICCTPCT in the last 72 hours. Urinalysis    Component Value Date/Time   COLORURINE YELLOW 10/01/2020 2231   APPEARANCEUR CLOUDY (A) 10/01/2020 2231   LABSPEC 1.017 10/01/2020 2231   PHURINE 6.0 10/01/2020 2231   GLUCOSEU 50 (A) 10/01/2020 2231   HGBUR NEGATIVE 10/01/2020 2231   BILIRUBINUR NEGATIVE 10/01/2020 2231   KETONESUR 5 (A) 10/01/2020 2231   PROTEINUR NEGATIVE 10/01/2020 2231   UROBILINOGEN 0.2 02/09/2015 0158   NITRITE POSITIVE (A) 10/01/2020 2231   LEUKOCYTESUR LARGE (A) 10/01/2020 2231   Sepsis Labs Invalid input(s): PROCALCITONIN,  WBC,  LACTICIDVEN Microbiology Recent Results (from  the past 240 hour(s))  Urine culture     Status: Abnormal   Collection Time: 10/01/20 11:45 PM   Specimen: Urine, Catheterized  Result Value Ref Range Status   Specimen Description   Final    URINE, CATHETERIZED Performed at [facility] [address], [place], [place] [address]    Special Requests   Final    NONE Performed at [facility], [place] [address], [place], [place] [address]    Culture (A)  Final    >=100,000 COLONIES/mL KLEBSIELLA PNEUMONIAE >=100,000 COLONIES/mL ESCHERICHIA COLI    Report Status 10/04/2020 FINAL  Final   Organism ID, Bacteria KLEBSIELLA PNEUMONIAE (A)  Final   Organism ID, Bacteria ESCHERICHIA COLI (A)  Final      Susceptibility   Escherichia coli - MIC*    AMPICILLIN 4  SENSITIVE Sensitive     CEFAZOLIN <=4 SENSITIVE Sensitive     CEFEPIME <=0.12 SENSITIVE Sensitive     CEFTRIAXONE <=0.25 SENSITIVE Sensitive     CIPROFLOXACIN >=4 RESISTANT Resistant     GENTAMICIN <=1 SENSITIVE Sensitive     IMIPENEM <=0.25 SENSITIVE Sensitive     NITROFURANTOIN <=16 SENSITIVE Sensitive     TRIMETH/SULFA <=20 SENSITIVE Sensitive     AMPICILLIN/SULBACTAM <=2 SENSITIVE Sensitive     PIP/TAZO <=4 SENSITIVE Sensitive     * >=100,000 COLONIES/mL ESCHERICHIA COLI   Klebsiella pneumoniae - MIC*    AMPICILLIN RESISTANT Resistant     CEFAZOLIN <=4 SENSITIVE Sensitive     CEFEPIME <=0.12 SENSITIVE Sensitive     CEFTRIAXONE <=0.25 SENSITIVE Sensitive     CIPROFLOXACIN <=0.25 SENSITIVE Sensitive     GENTAMICIN <=1 SENSITIVE Sensitive     IMIPENEM <=0.25 SENSITIVE Sensitive     NITROFURANTOIN <=16 SENSITIVE Sensitive     TRIMETH/SULFA <=20 SENSITIVE Sensitive     AMPICILLIN/SULBACTAM <=2 SENSITIVE Sensitive     PIP/TAZO <=4 SENSITIVE Sensitive     * >=100,000 COLONIES/mL KLEBSIELLA PNEUMONIAE  Resp Panel by RT-PCR (Flu A&B, Covid) Nasopharyngeal Swab     Status: None   Collection Time: 10/02/20  2:27 AM   Specimen: Nasopharyngeal Swab; Nasopharyngeal(NP) swabs in vial transport medium  Result Value Ref Range Status   SARS Coronavirus 2 by RT PCR NEGATIVE NEGATIVE Final    Comment: (NOTE) SARS-CoV-2 target nucleic acids are NOT DETECTED.  The SARS-CoV-2 RNA is generally detectable in upper respiratory specimens during the acute phase of infection. The lowest concentration of SARS-CoV-2 viral copies this assay can detect is 138 copies/mL. A negative result does not preclude SARS-Cov-2 infection and should not be used as the sole basis for treatment or other patient management decisions. A negative result may occur with  improper specimen collection/handling, submission of specimen other than nasopharyngeal swab, presence of viral mutation(s) within the areas targeted by  this assay, and inadequate number of viral copies(<138 copies/mL). A negative result must be combined with clinical observations, patient history, and epidemiological information. The expected result is Negative.  Fact Sheet for Patients:  EntrepreneurPulse.com.au  Fact Sheet for Healthcare Providers:  IncredibleEmployment.be  This test is no t yet approved or cleared by the [place] FDA and  has been authorized for detection and/or diagnosis of SARS-CoV-2 by FDA under an Emergency Use Authorization (EUA). This EUA will remain  in effect (meaning this test can be used) for the duration of the COVID-19 declaration under Section 564(b)(1) of the Act, 21 U.S.C.section 360bbb-3(b)(1), unless the authorization is terminated  or revoked sooner.  Influenza A by PCR NEGATIVE NEGATIVE Final   Influenza B by PCR NEGATIVE NEGATIVE Final    Comment: (NOTE) The Xpert Xpress SARS-CoV-2/FLU/RSV plus assay is intended as an aid in the diagnosis of influenza from Nasopharyngeal swab specimens and should not be used as a sole basis for treatment. Nasal washings and aspirates are unacceptable for Xpert Xpress SARS-CoV-2/FLU/RSV testing.  Fact Sheet for Patients: EntrepreneurPulse.com.au  Fact Sheet for Healthcare Providers: IncredibleEmployment.be  This test is not yet approved or cleared by the [place] FDA and has been authorized for detection and/or diagnosis of SARS-CoV-2 by FDA under an Emergency Use Authorization (EUA). This EUA will remain in effect (meaning this test can be used) for the duration of the COVID-19 declaration under Section 564(b)(1) of the Act, 21 U.S.C. section 360bbb-3(b)(1), unless the authorization is terminated or revoked.  Performed at [facility], [place] [address] [place], [place] [address]    Time coordinating discharge: 35 minutes  SIGNED:  Kerney Elbe, DO [facility] 10/05/2020, 11:04 AM Pager is on [place]  If 7PM-7AM, please contact night-coverage [email or web address]

## 2020-10-06 DIAGNOSIS — E039 Hypothyroidism, unspecified: Secondary | ICD-10-CM | POA: Diagnosis not present

## 2020-10-06 DIAGNOSIS — I679 Cerebrovascular disease, unspecified: Secondary | ICD-10-CM | POA: Diagnosis not present

## 2020-10-06 DIAGNOSIS — I10 Essential (primary) hypertension: Secondary | ICD-10-CM | POA: Diagnosis not present

## 2020-10-06 DIAGNOSIS — N183 Chronic kidney disease, stage 3 unspecified: Secondary | ICD-10-CM | POA: Diagnosis not present

## 2020-10-06 DIAGNOSIS — Z87440 Personal history of urinary (tract) infections: Secondary | ICD-10-CM | POA: Diagnosis not present

## 2020-10-06 DIAGNOSIS — R52 Pain, unspecified: Secondary | ICD-10-CM | POA: Diagnosis not present

## 2020-10-06 DIAGNOSIS — Z79899 Other long term (current) drug therapy: Secondary | ICD-10-CM | POA: Diagnosis not present

## 2020-10-06 DIAGNOSIS — R69 Illness, unspecified: Secondary | ICD-10-CM | POA: Diagnosis not present

## 2020-10-10 DIAGNOSIS — R5381 Other malaise: Secondary | ICD-10-CM | POA: Diagnosis not present

## 2020-10-10 DIAGNOSIS — Z87440 Personal history of urinary (tract) infections: Secondary | ICD-10-CM | POA: Diagnosis not present

## 2020-10-13 DIAGNOSIS — R69 Illness, unspecified: Secondary | ICD-10-CM | POA: Diagnosis not present

## 2020-10-13 DIAGNOSIS — I10 Essential (primary) hypertension: Secondary | ICD-10-CM | POA: Diagnosis not present

## 2020-10-13 DIAGNOSIS — Z79899 Other long term (current) drug therapy: Secondary | ICD-10-CM | POA: Diagnosis not present

## 2020-10-13 DIAGNOSIS — N183 Chronic kidney disease, stage 3 unspecified: Secondary | ICD-10-CM | POA: Diagnosis not present

## 2020-10-13 DIAGNOSIS — R52 Pain, unspecified: Secondary | ICD-10-CM | POA: Diagnosis not present

## 2020-10-13 DIAGNOSIS — I63511 Cerebral infarction due to unspecified occlusion or stenosis of right middle cerebral artery: Secondary | ICD-10-CM | POA: Diagnosis not present

## 2020-10-13 DIAGNOSIS — R5381 Other malaise: Secondary | ICD-10-CM | POA: Diagnosis not present

## 2020-10-13 DIAGNOSIS — E039 Hypothyroidism, unspecified: Secondary | ICD-10-CM | POA: Diagnosis not present

## 2020-10-25 DIAGNOSIS — N183 Chronic kidney disease, stage 3 unspecified: Secondary | ICD-10-CM | POA: Diagnosis not present

## 2020-10-25 DIAGNOSIS — I10 Essential (primary) hypertension: Secondary | ICD-10-CM | POA: Diagnosis not present

## 2020-10-25 DIAGNOSIS — I63511 Cerebral infarction due to unspecified occlusion or stenosis of right middle cerebral artery: Secondary | ICD-10-CM | POA: Diagnosis not present

## 2020-10-25 DIAGNOSIS — R5381 Other malaise: Secondary | ICD-10-CM | POA: Diagnosis not present

## 2020-10-25 DIAGNOSIS — I679 Cerebrovascular disease, unspecified: Secondary | ICD-10-CM | POA: Diagnosis not present

## 2020-10-25 DIAGNOSIS — R69 Illness, unspecified: Secondary | ICD-10-CM | POA: Diagnosis not present

## 2020-11-14 DIAGNOSIS — I129 Hypertensive chronic kidney disease with stage 1 through stage 4 chronic kidney disease, or unspecified chronic kidney disease: Secondary | ICD-10-CM | POA: Diagnosis not present

## 2020-11-14 DIAGNOSIS — S7000XA Contusion of unspecified hip, initial encounter: Secondary | ICD-10-CM | POA: Diagnosis not present

## 2020-11-14 DIAGNOSIS — Z87440 Personal history of urinary (tract) infections: Secondary | ICD-10-CM | POA: Diagnosis not present

## 2020-11-14 DIAGNOSIS — N1832 Chronic kidney disease, stage 3b: Secondary | ICD-10-CM | POA: Diagnosis not present

## 2020-11-14 DIAGNOSIS — W19XXXA Unspecified fall, initial encounter: Secondary | ICD-10-CM | POA: Diagnosis not present

## 2020-11-14 DIAGNOSIS — R42 Dizziness and giddiness: Secondary | ICD-10-CM | POA: Diagnosis not present

## 2020-11-14 DIAGNOSIS — E039 Hypothyroidism, unspecified: Secondary | ICD-10-CM | POA: Diagnosis not present

## 2020-11-14 DIAGNOSIS — R69 Illness, unspecified: Secondary | ICD-10-CM | POA: Diagnosis not present

## 2020-11-14 DIAGNOSIS — I10 Essential (primary) hypertension: Secondary | ICD-10-CM | POA: Diagnosis not present

## 2020-11-17 ENCOUNTER — Telehealth: Payer: Self-pay

## 2020-11-17 NOTE — Telephone Encounter (Signed)
(  10:06 am) SW attempted to call patient's son-[name] Bunch to schedule and initial palliative care visit with his mother. SW left a message requesting a return call.

## 2021-03-11 IMAGING — CT CT HEAD W/O CM
3 of 4 series · 15 of 47 positions shown, 18 images · non-contrast
Comparison: 09/26/2019

CLINICAL DATA: Tripped and fell

EXAM:
CT HEAD WITHOUT CONTRAST
TECHNIQUE: Contiguous axial images were obtained from the base of the skull
through the vertex without intravenous contrast.

[Series 3: head wo · axial · 0.45mm/px · z∈[-176,-56]mm · 9 of 32 slices shown, 12 images]
[im 4/32  brain]
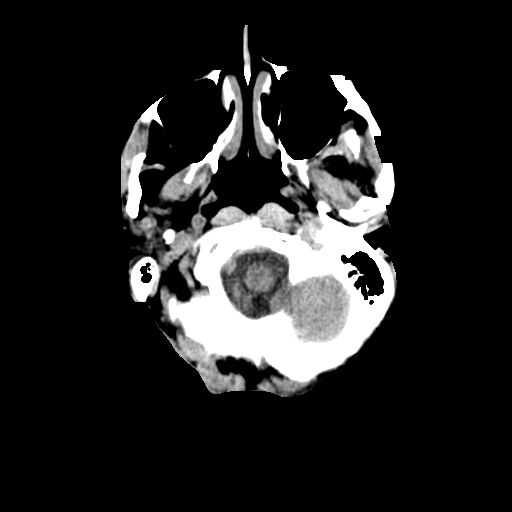
[im 4/32  bone]
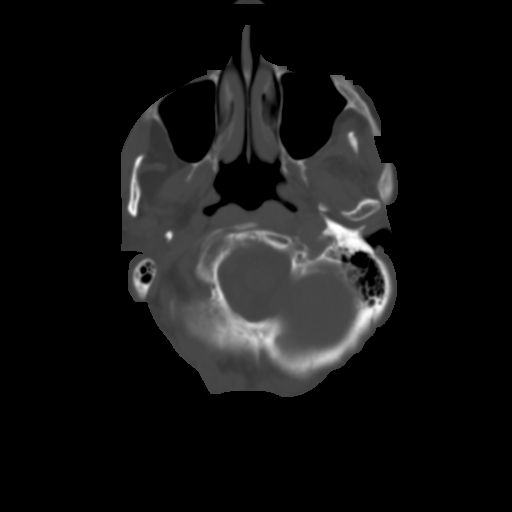
[im 7/32  brain]
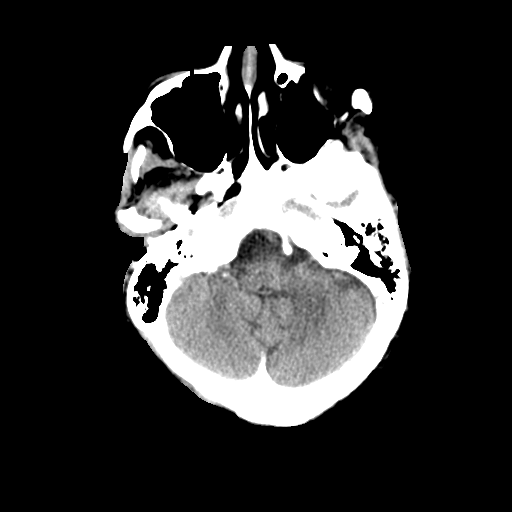
[im 10/32  brain]
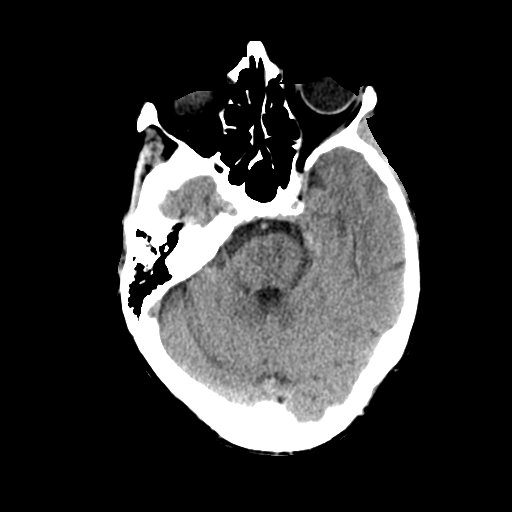
[im 13/32  brain]
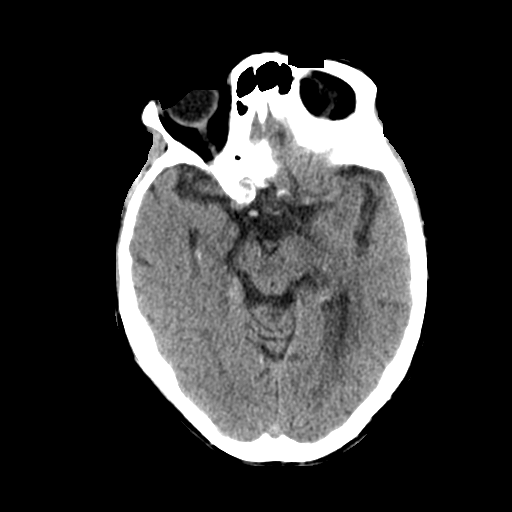
[im 16/32  brain]
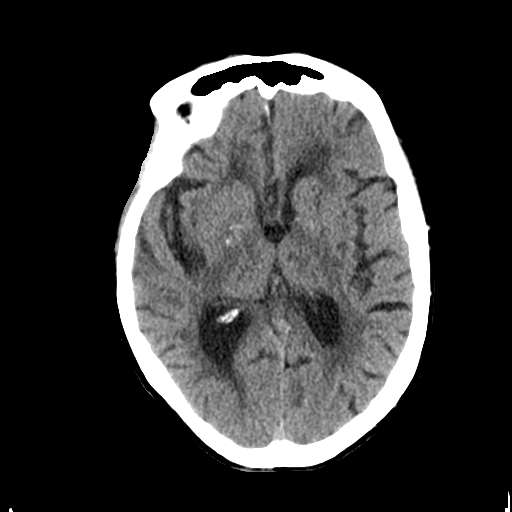
[im 16/32  bone]
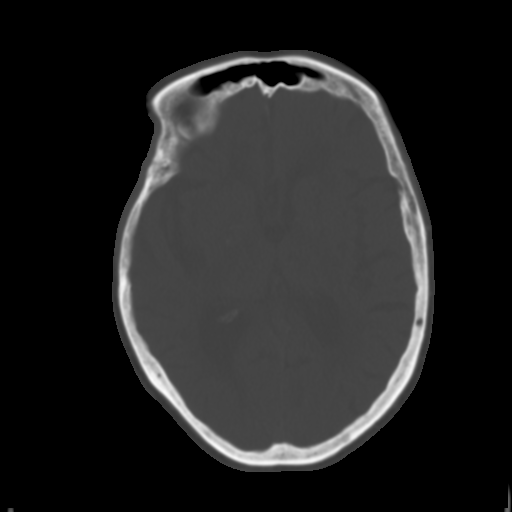
[im 19/32  brain]
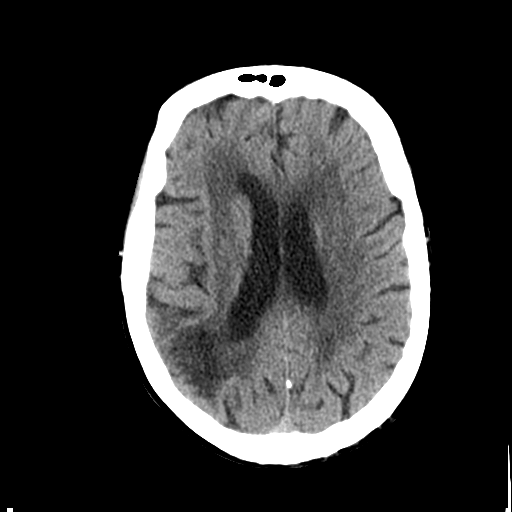
[im 22/32  brain]
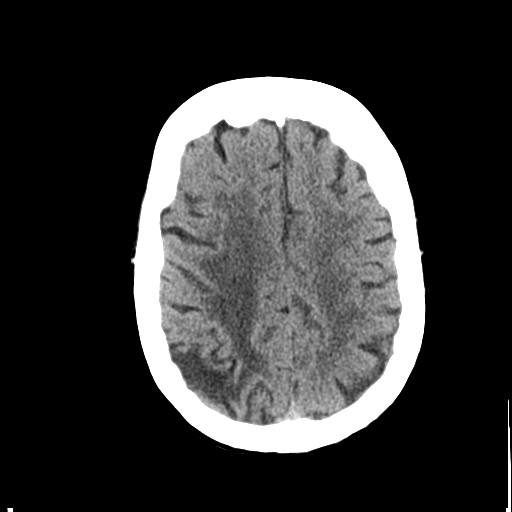
[im 25/32  brain]
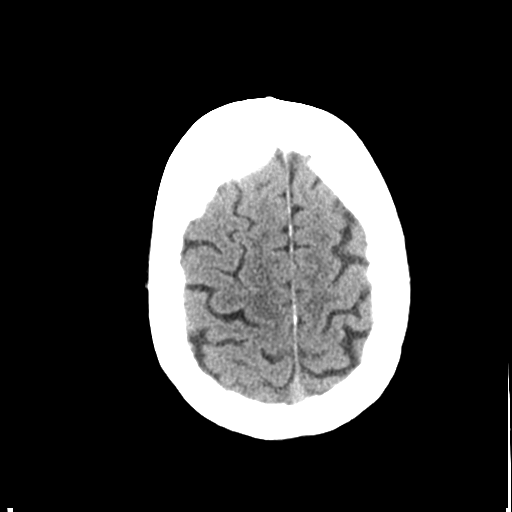
[im 28/32  brain]
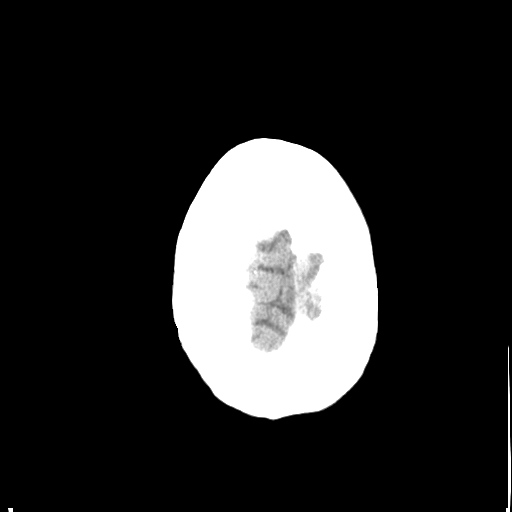
[im 28/32  bone]
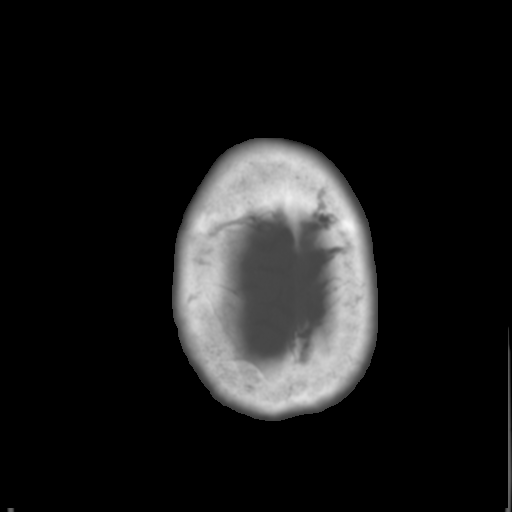

[Series 5: coronal soft tissue · coronal · 0.33mm/px · 3 of 74 slices shown]
[im 25/74  brain]
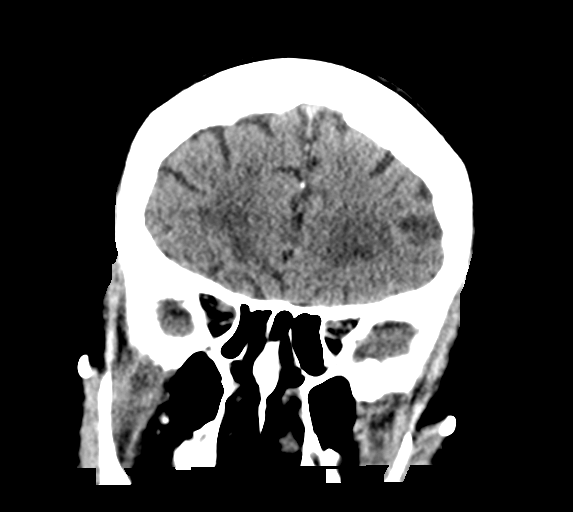
[im 33/74  brain]
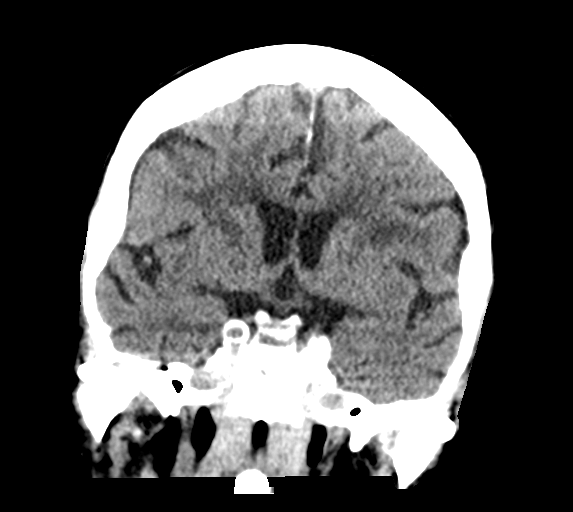
[im 41/74  brain]
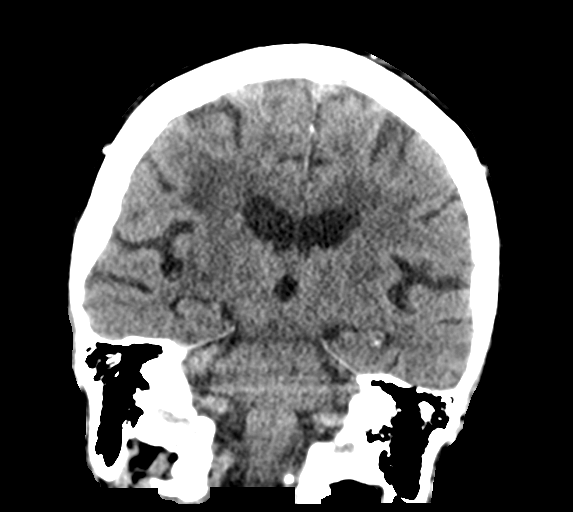

[Series 6: sagittal soft tissue · sagittal · 0.33mm/px · 3 of 58 slices shown]
[im 20/58  brain]
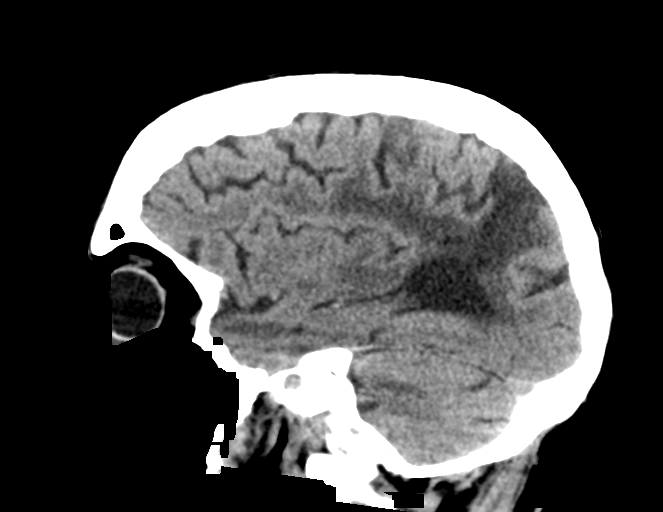
[im 29/58  brain]
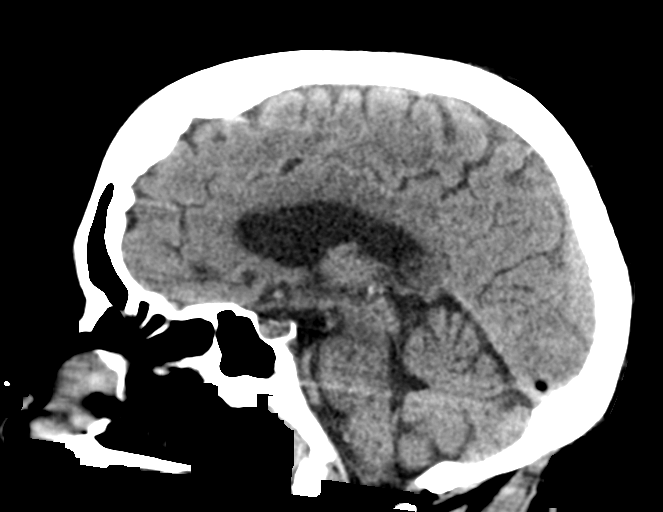
[im 39/58  brain]
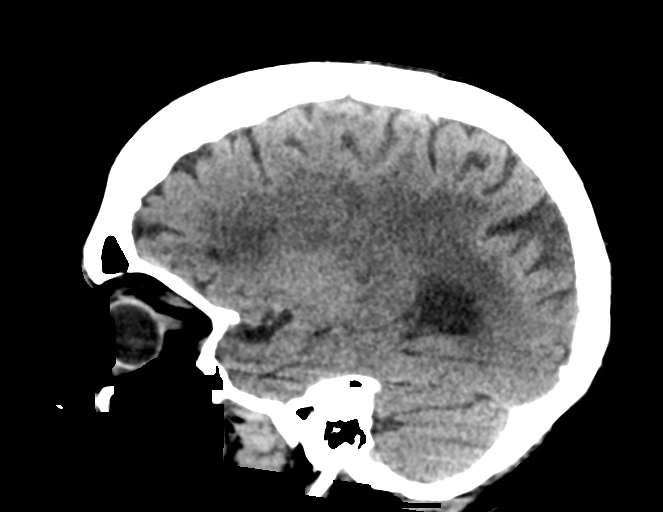

[15 of 47 positions shown; findings below may reference images not displayed]

FINDINGS: Brain: Chronic hypodensities throughout the periventricular white
matter and right parietal lobe consistent with chronic ischemic
change. No signs of acute infarct or hemorrhage. Lateral ventricles
and midline structures are stable. No acute extra-axial fluid
collections. No mass effect.

Vascular: No hyperdense vessel or unexpected calcification.

Skull: Normal. Negative for fracture or focal lesion.

Sinuses/Orbits: No acute finding.

Other: None.
IMPRESSION: 1. Chronic ischemic changes, no acute process.

## 2021-03-11 IMAGING — CT CT CERVICAL SPINE W/O CM
3 series · 15 of 27 positions shown, 18 images · non-contrast
Comparison: 09/26/2019

CLINICAL DATA: Un witnessed fall, tripped and fell

EXAM:
CT CERVICAL SPINE WITHOUT CONTRAST
TECHNIQUE: Multidetector CT imaging of the cervical spine was performed without
intravenous contrast. Multiplanar CT image reconstructions were also
generated.

[Series 4: c spine soft · axial · 0.39mm/px · z∈[-290,-198]mm · 4 of 78 slices shown]
[im 16/78  soft-tissue]
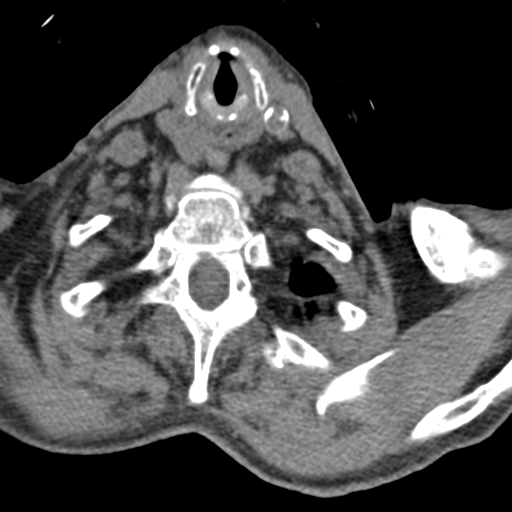
[im 31/78  soft-tissue]
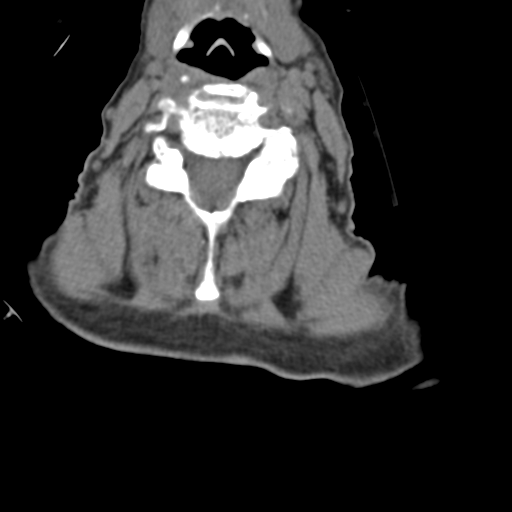
[im 47/78  soft-tissue]
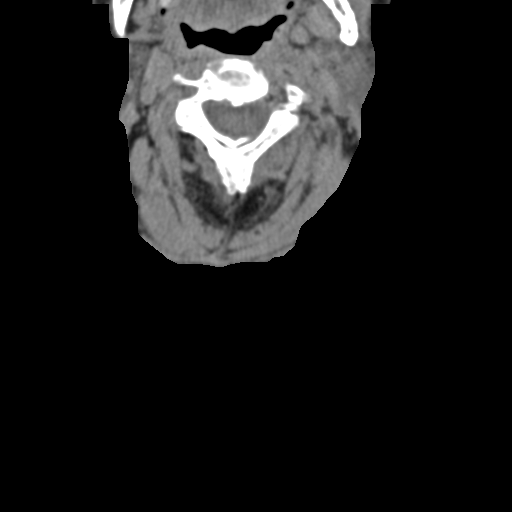
[im 62/78  soft-tissue]
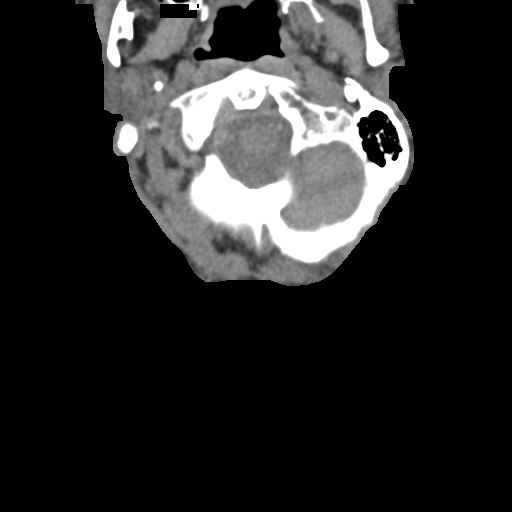

[Series 5: orthogonal bone · axial · 0.23mm/px · z∈[-365,-243]mm · 6 of 103 slices shown, 8 images]
[im 15/103  soft-tissue]
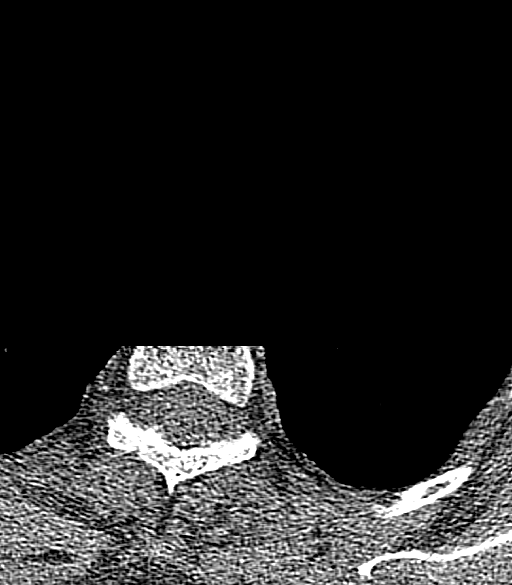
[im 15/103  bone]
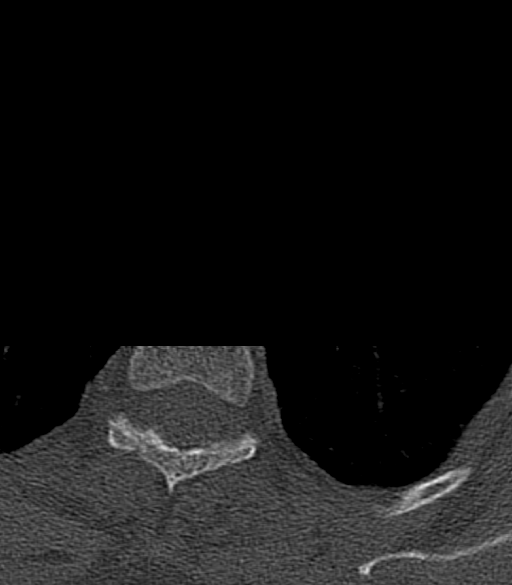
[im 30/103  bone]
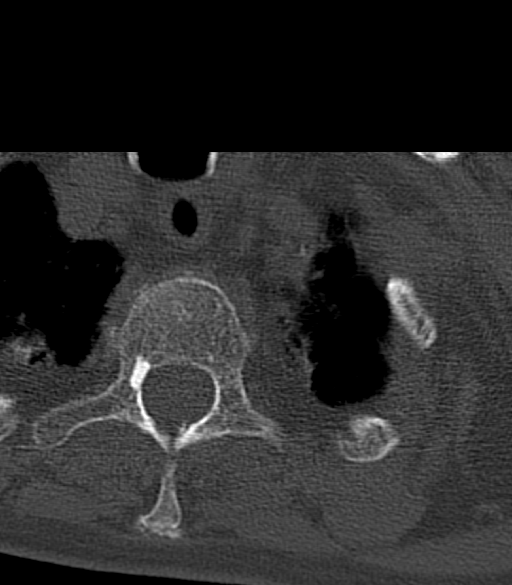
[im 44/103  bone]
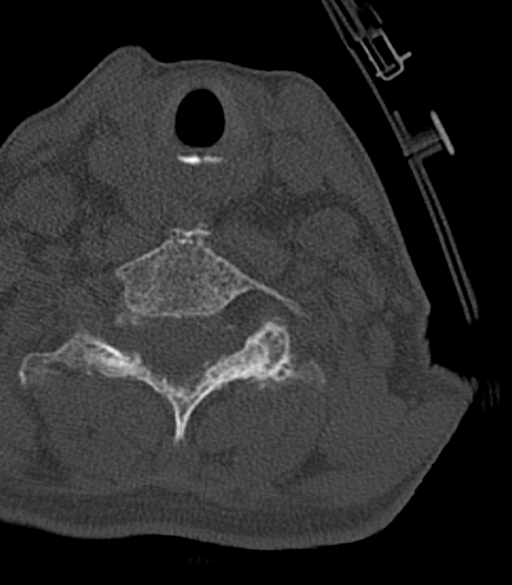
[im 59/103  bone]
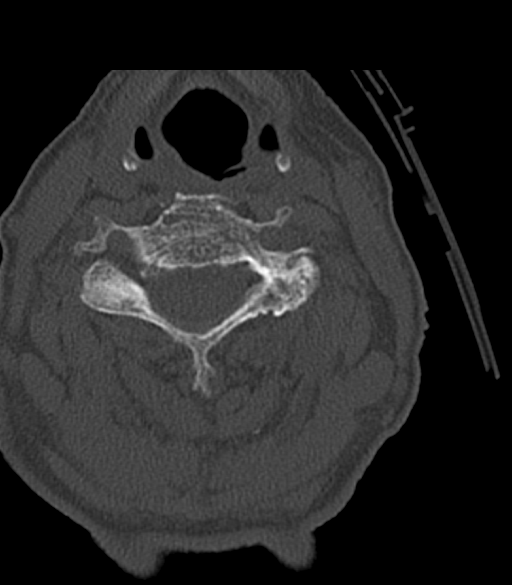
[im 73/103  soft-tissue]
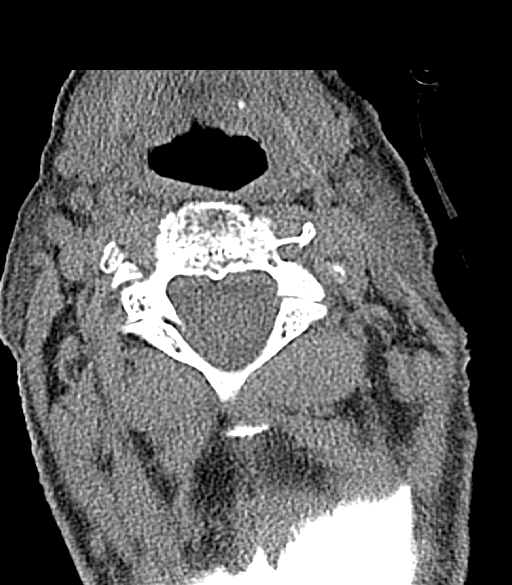
[im 73/103  bone]
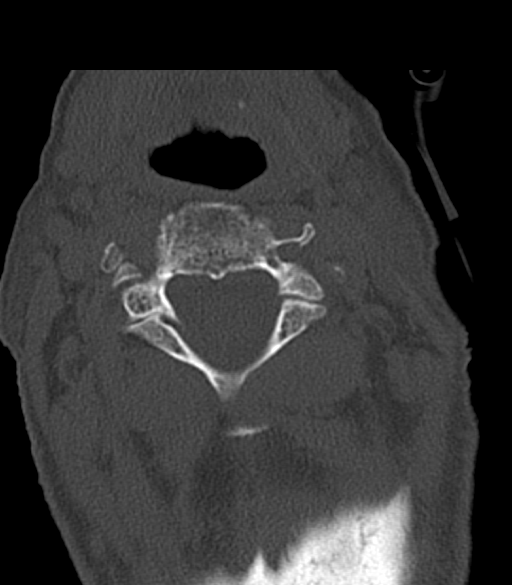
[im 88/103  bone]
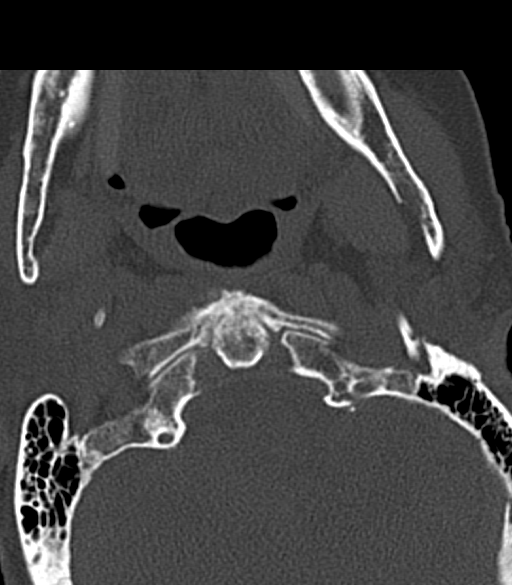

[Series 7: sagittal bone · sagittal · 0.31mm/px · 5 of 61 slices shown, 6 images]
[im 21/61  bone]
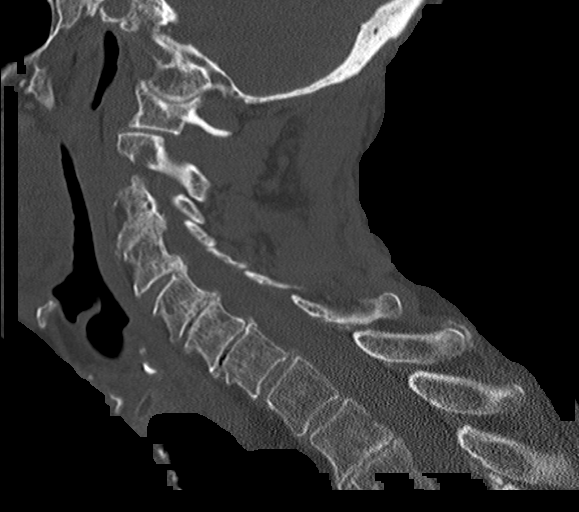
[im 26/61  bone]
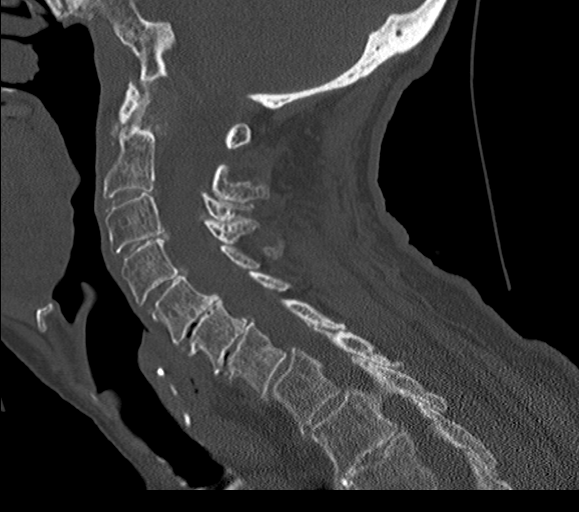
[im 31/61  soft-tissue]
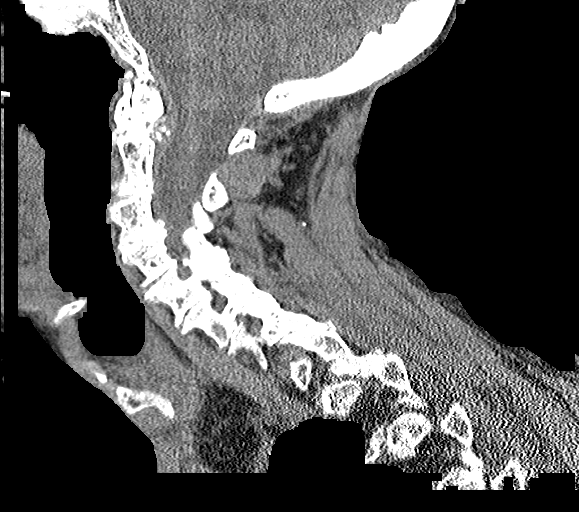
[im 31/61  bone]
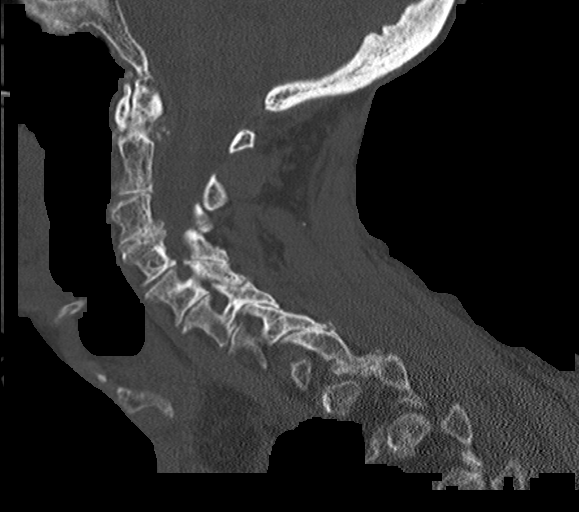
[im 36/61  bone]
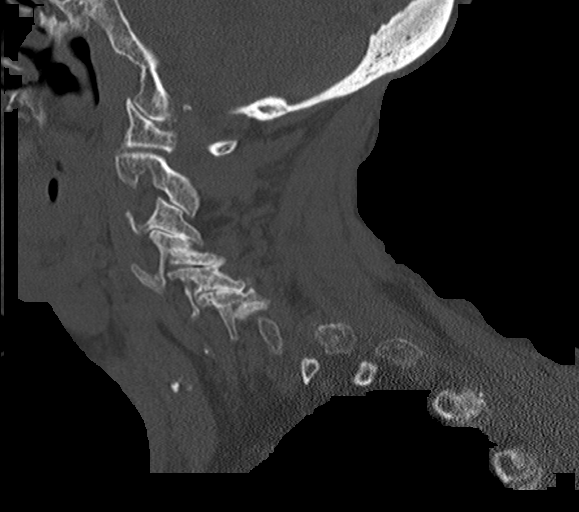
[im 41/61  bone]
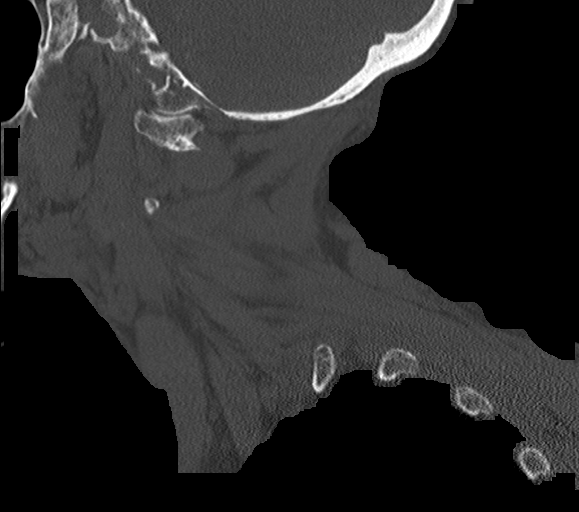

[15 of 27 positions shown; findings below may reference images not displayed]

FINDINGS: Alignment: Alignment is grossly anatomic.

Skull base and vertebrae: No acute displaced fractures.

Soft tissues and spinal canal: No prevertebral fluid or swelling. No
visible canal hematoma.

Disc levels: Stable hypertrophic changes at the C1-C2 interface.
There is diffuse multilevel spondylosis and facet hypertrophy
without significant change since prior exam. Multilevel neural
foraminal encroachment greatest at C3/C4. No significant bony
encroachment on the central canal.

Upper chest: Airway is patent. Stable biapical pleural and
parenchymal scarring.

Other: Reconstructed images demonstrate no additional findings.
IMPRESSION: 1. No acute cervical spine fracture.
2. Stable multilevel cervical spondylosis and facet hypertrophy.

## 2021-03-14 DIAGNOSIS — R319 Hematuria, unspecified: Secondary | ICD-10-CM | POA: Diagnosis not present

## 2021-03-14 DIAGNOSIS — N1831 Chronic kidney disease, stage 3a: Secondary | ICD-10-CM | POA: Diagnosis not present

## 2021-03-14 DIAGNOSIS — Z0000 Encounter for general adult medical examination without abnormal findings: Secondary | ICD-10-CM | POA: Diagnosis not present

## 2021-03-14 DIAGNOSIS — R600 Localized edema: Secondary | ICD-10-CM | POA: Diagnosis not present

## 2021-03-14 DIAGNOSIS — I129 Hypertensive chronic kidney disease with stage 1 through stage 4 chronic kidney disease, or unspecified chronic kidney disease: Secondary | ICD-10-CM | POA: Diagnosis not present

## 2021-03-14 DIAGNOSIS — Z23 Encounter for immunization: Secondary | ICD-10-CM | POA: Diagnosis not present

## 2021-03-14 DIAGNOSIS — I700 Atherosclerosis of aorta: Secondary | ICD-10-CM | POA: Diagnosis not present

## 2021-03-14 DIAGNOSIS — E039 Hypothyroidism, unspecified: Secondary | ICD-10-CM | POA: Diagnosis not present

## 2021-03-14 DIAGNOSIS — M810 Age-related osteoporosis without current pathological fracture: Secondary | ICD-10-CM | POA: Diagnosis not present

## 2021-03-14 DIAGNOSIS — R69 Illness, unspecified: Secondary | ICD-10-CM | POA: Diagnosis not present

## 2021-09-04 DIAGNOSIS — Z23 Encounter for immunization: Secondary | ICD-10-CM | POA: Diagnosis not present

## 2021-09-04 DIAGNOSIS — I129 Hypertensive chronic kidney disease with stage 1 through stage 4 chronic kidney disease, or unspecified chronic kidney disease: Secondary | ICD-10-CM | POA: Diagnosis not present

## 2021-09-04 DIAGNOSIS — E441 Mild protein-calorie malnutrition: Secondary | ICD-10-CM | POA: Diagnosis not present

## 2021-09-04 DIAGNOSIS — N1832 Chronic kidney disease, stage 3b: Secondary | ICD-10-CM | POA: Diagnosis not present

## 2021-09-04 DIAGNOSIS — R69 Illness, unspecified: Secondary | ICD-10-CM | POA: Diagnosis not present

## 2021-09-04 DIAGNOSIS — R600 Localized edema: Secondary | ICD-10-CM | POA: Diagnosis not present

## 2021-09-04 DIAGNOSIS — L821 Other seborrheic keratosis: Secondary | ICD-10-CM | POA: Diagnosis not present

## 2021-11-21 ENCOUNTER — Inpatient Hospital Stay (HOSPITAL_COMMUNITY)
Admission: EM | Admit: 2021-11-21 | Discharge: 2021-11-26 | DRG: 689 | Disposition: A | Discharge status: Home / Self Care | Payer: Medicare HMO | Attending: Internal Medicine | Admitting: Internal Medicine

## 2021-11-21 ENCOUNTER — Emergency Department (HOSPITAL_COMMUNITY): Payer: Medicare HMO

## 2021-11-21 ENCOUNTER — Encounter (HOSPITAL_COMMUNITY): Payer: Self-pay

## 2021-11-21 ENCOUNTER — Other Ambulatory Visit: Payer: Self-pay

## 2021-11-21 DIAGNOSIS — Z79890 Hormone replacement therapy (postmenopausal): Secondary | ICD-10-CM

## 2021-11-21 DIAGNOSIS — N390 Urinary tract infection, site not specified: Secondary | ICD-10-CM | POA: Diagnosis not present

## 2021-11-21 DIAGNOSIS — Z90710 Acquired absence of both cervix and uterus: Secondary | ICD-10-CM

## 2021-11-21 DIAGNOSIS — N183 Chronic kidney disease, stage 3 unspecified: Secondary | ICD-10-CM | POA: Diagnosis present

## 2021-11-21 DIAGNOSIS — E440 Moderate protein-calorie malnutrition: Secondary | ICD-10-CM | POA: Insufficient documentation

## 2021-11-21 DIAGNOSIS — Z9013 Acquired absence of bilateral breasts and nipples: Secondary | ICD-10-CM

## 2021-11-21 DIAGNOSIS — I10 Essential (primary) hypertension: Secondary | ICD-10-CM | POA: Diagnosis present

## 2021-11-21 DIAGNOSIS — I129 Hypertensive chronic kidney disease with stage 1 through stage 4 chronic kidney disease, or unspecified chronic kidney disease: Secondary | ICD-10-CM | POA: Diagnosis present

## 2021-11-21 DIAGNOSIS — E032 Hypothyroidism due to medicaments and other exogenous substances: Secondary | ICD-10-CM | POA: Diagnosis present

## 2021-11-21 DIAGNOSIS — F0392 Unspecified dementia, unspecified severity, with psychotic disturbance: Secondary | ICD-10-CM | POA: Diagnosis present

## 2021-11-21 DIAGNOSIS — N1831 Chronic kidney disease, stage 3a: Secondary | ICD-10-CM | POA: Diagnosis not present

## 2021-11-21 DIAGNOSIS — Z6823 Body mass index (BMI) 23.0-23.9, adult: Secondary | ICD-10-CM

## 2021-11-21 DIAGNOSIS — Z743 Need for continuous supervision: Secondary | ICD-10-CM | POA: Diagnosis not present

## 2021-11-21 DIAGNOSIS — E039 Hypothyroidism, unspecified: Secondary | ICD-10-CM | POA: Diagnosis present

## 2021-11-21 DIAGNOSIS — Z885 Allergy status to narcotic agent status: Secondary | ICD-10-CM

## 2021-11-21 DIAGNOSIS — Z86010 Personal history of colonic polyps: Secondary | ICD-10-CM

## 2021-11-21 DIAGNOSIS — R410 Disorientation, unspecified: Secondary | ICD-10-CM | POA: Diagnosis not present

## 2021-11-21 DIAGNOSIS — Z66 Do not resuscitate: Secondary | ICD-10-CM | POA: Diagnosis present

## 2021-11-21 DIAGNOSIS — M810 Age-related osteoporosis without current pathological fracture: Secondary | ICD-10-CM | POA: Diagnosis present

## 2021-11-21 DIAGNOSIS — Z9049 Acquired absence of other specified parts of digestive tract: Secondary | ICD-10-CM

## 2021-11-21 DIAGNOSIS — G9341 Metabolic encephalopathy: Secondary | ICD-10-CM | POA: Diagnosis present

## 2021-11-21 DIAGNOSIS — N179 Acute kidney failure, unspecified: Secondary | ICD-10-CM | POA: Diagnosis present

## 2021-11-21 DIAGNOSIS — Z79899 Other long term (current) drug therapy: Secondary | ICD-10-CM

## 2021-11-21 DIAGNOSIS — Z882 Allergy status to sulfonamides status: Secondary | ICD-10-CM

## 2021-11-21 DIAGNOSIS — Z85828 Personal history of other malignant neoplasm of skin: Secondary | ICD-10-CM

## 2021-11-21 DIAGNOSIS — Z853 Personal history of malignant neoplasm of breast: Secondary | ICD-10-CM

## 2021-11-21 DIAGNOSIS — E860 Dehydration: Secondary | ICD-10-CM | POA: Diagnosis present

## 2021-11-21 DIAGNOSIS — Z8673 Personal history of transient ischemic attack (TIA), and cerebral infarction without residual deficits: Secondary | ICD-10-CM

## 2021-11-21 LAB — URINALYSIS, ROUTINE W REFLEX MICROSCOPIC
Bilirubin Urine: NEGATIVE
Glucose, UA: NEGATIVE mg/dL
Hgb urine dipstick: NEGATIVE
Ketones, ur: NEGATIVE mg/dL
Nitrite: NEGATIVE
Protein, ur: NEGATIVE mg/dL
Specific Gravity, Urine: 1.018 (ref 1.005–1.030)
pH: 5 (ref 5.0–8.0)

## 2021-11-21 LAB — COMPREHENSIVE METABOLIC PANEL
ALT: 11 U/L (ref 0–44)
AST: 16 U/L (ref 15–41)
Albumin: 3.6 g/dL (ref 3.5–5.0)
Alkaline Phosphatase: 75 U/L (ref 38–126)
Anion gap: 7 (ref 5–15)
BUN: 31 mg/dL — ABNORMAL HIGH (ref 8–23)
CO2: 25 mmol/L (ref 22–32)
Calcium: 9.3 mg/dL (ref 8.9–10.3)
Chloride: 110 mmol/L (ref 98–111)
Creatinine, Ser: 1.78 mg/dL — ABNORMAL HIGH (ref 0.44–1.00)
GFR, Estimated: 26 mL/min — ABNORMAL LOW (ref >60)
Glucose, Bld: 102 mg/dL — ABNORMAL HIGH (ref 70–99)
Potassium: 4 mmol/L (ref 3.5–5.1)
Sodium: 142 mmol/L (ref 135–145)
Total Bilirubin: 0.4 mg/dL (ref 0.3–1.2)
Total Protein: 6.4 g/dL — ABNORMAL LOW (ref 6.5–8.1)

## 2021-11-21 LAB — BRAIN NATRIURETIC PEPTIDE: B Natriuretic Peptide: 310.3 pg/mL — ABNORMAL HIGH (ref 0.0–100.0)

## 2021-11-21 LAB — PROTIME-INR
INR: 0.9 (ref 0.8–1.2)
Prothrombin Time: 12.5 seconds (ref 11.4–15.2)

## 2021-11-21 LAB — CBC
HCT: 35.8 % — ABNORMAL LOW (ref 36.0–46.0)
Hemoglobin: 11.5 g/dL — ABNORMAL LOW (ref 12.0–15.0)
MCH: 31.4 pg (ref 26.0–34.0)
MCHC: 32.1 g/dL (ref 30.0–36.0)
MCV: 97.8 fL (ref 80.0–100.0)
Platelets: 176 10*3/uL (ref 150–400)
RBC: 3.66 MIL/uL — ABNORMAL LOW (ref 3.87–5.11)
RDW: 13.2 % (ref 11.5–15.5)
WBC: 6 10*3/uL (ref 4.0–10.5)
nRBC: 0 % (ref 0.0–0.2)

## 2021-11-21 LAB — TSH: TSH: 0.192 u[IU]/mL — ABNORMAL LOW (ref 0.350–4.500)

## 2021-11-21 LAB — CBG MONITORING, ED: Glucose-Capillary: 121 mg/dL — ABNORMAL HIGH (ref 70–99)

## 2021-11-21 MED ORDER — LACTATED RINGERS IV SOLN: INTRAVENOUS | Status: DC

## 2021-11-21 MED ORDER — METOPROLOL TARTRATE 50 MG PO TABS
50.0000 mg | ORAL_TABLET | Freq: Two times a day (BID) | ORAL | Status: DC
  Administered 2021-11-21 – 2021-11-26 (×10): 50 mg via ORAL
  Filled 2021-11-21: qty 1
  Filled 2021-11-21: qty 2
  Filled 2021-11-21 (×2): qty 1
  Filled 2021-11-21: qty 2
  Filled 2021-11-21 (×5): qty 1

## 2021-11-21 MED ORDER — ONDANSETRON HCL 4 MG/2ML IJ SOLN: 4.0000 mg | Freq: Four times a day (QID) | INTRAMUSCULAR | Status: DC | PRN

## 2021-11-21 MED ORDER — LEVOTHYROXINE SODIUM 75 MCG PO TABS
75.0000 ug | ORAL_TABLET | Freq: Every day | ORAL | Status: DC
  Administered 2021-11-22 – 2021-11-26 (×5): 75 ug via ORAL
  Filled 2021-11-21 (×5): qty 1

## 2021-11-21 MED ORDER — ONDANSETRON HCL 4 MG PO TABS: 4.0000 mg | ORAL_TABLET | Freq: Four times a day (QID) | ORAL | Status: DC | PRN

## 2021-11-21 MED ORDER — ACETAMINOPHEN 650 MG RE SUPP: 650.0000 mg | Freq: Four times a day (QID) | RECTAL | Status: DC | PRN

## 2021-11-21 MED ORDER — ACETAMINOPHEN 500 MG PO TABS
1000.0000 mg | ORAL_TABLET | Freq: Two times a day (BID) | ORAL | Status: DC
  Administered 2021-11-21 – 2021-11-26 (×10): 1000 mg via ORAL
  Filled 2021-11-21 (×10): qty 2

## 2021-11-21 MED ORDER — ENOXAPARIN SODIUM 30 MG/0.3ML IJ SOSY
30.0000 mg | PREFILLED_SYRINGE | INTRAMUSCULAR | Status: DC
  Administered 2021-11-22 – 2021-11-26 (×5): 30 mg via SUBCUTANEOUS
  Filled 2021-11-21 (×5): qty 0.3

## 2021-11-21 MED ORDER — SODIUM CHLORIDE 0.9 % IV BOLUS
250.0000 mL | Freq: Once | INTRAVENOUS | Status: AC
  Administered 2021-11-21: 250 mL via INTRAVENOUS

## 2021-11-21 MED ORDER — LOPERAMIDE HCL 1 MG/7.5ML PO SUSP
1.0000 mg | Freq: Every day | ORAL | Status: DC | PRN
Start: 2021-11-21 — End: 2021-11-26

## 2021-11-21 MED ORDER — ACETAMINOPHEN 325 MG PO TABS: 650.0000 mg | ORAL_TABLET | Freq: Four times a day (QID) | ORAL | Status: DC | PRN

## 2021-11-21 MED ORDER — SODIUM CHLORIDE 0.9 % IV SOLN
1.0000 g | Freq: Once | INTRAVENOUS | Status: AC
  Administered 2021-11-21: 1 g via INTRAVENOUS
  Filled 2021-11-21: qty 10

## 2021-11-21 NOTE — ED Notes (Signed)
Pt oriented to self, place. Pt unable to give this RN the year, pt has asked repeatedly for her son. Pt asking if son will find her here in the ED. Pt states she feels like she has been more forgetful.

## 2021-11-21 NOTE — Assessment & Plan Note (Addendum)
Resume synthroid when med-rec completed. May wish to slightly reduce dose as TSH today slightly low at 0.192. Update: will decrease synthroid dose to 72mg.

## 2021-11-21 NOTE — H&P (Addendum)
History and Physical    Patient: Tiffany [name] VOZ:[id number] DOB: 10/21/1926 DOA: 11/21/2021 DOS: the patient was seen and examined on 11/21/2021 PCP: Donald Prose, MD  Patient coming from:  ILF  Chief Complaint:  Chief Complaint  Patient presents with   Altered Mental Status   HPI: Tiffany [name] is a 88 y.o. female with medical history significant of dementia, hypothyroidism, HTN.  At baseline she has fairly significant dementia, probably should be in at least an ALF if not higher level of care though Son admits hasnt been able to move her due to financial situation.  This past week however, she has acute worsening of mental status with hallucinations and increased confusion from baseline.  Has had this in past with UTI.  Also has foul smelling urine this past week.  Pt oriented to person and place, not time.  H/o admit for UTI + AKI + delirium in May 2022.  Mostly sensitive E.Coli (resistant to cipro only) + K.Pneumo (resistant to ampicillin only) in urine that admit.   Review of Systems: As mentioned in the history of present illness. All other systems reviewed and are negative. Past Medical History:  Diagnosis Date   Arthritis    Atypical mole 09/23/2002   Right lower cheek (slight) (widershave)   Atypical mole 09/23/2002   Left Lower Cheek (slight to moderate) (widershave and excision)   Atypical mole 12/19/2008   Left Cheek (atypical nevi) (excision)   BCC (basal cell carcinoma of skin) 03/18/1995   Right Abdomen   BCC (basal cell carcinoma of skin) 06/12/1999   Upper Right Chest (curet and 5FU)   Breast cancer (HCC)    Colon polyps    Hypertension    Hypothyroidism    Osteoporosis    Rectal bleeding    SCCA (squamous cell carcinoma) of skin 03/06/2004   Left Shin (curet and 5FU)   SCCA (squamous cell carcinoma) of skin 10/07/2006   Left Hand (in situ) (curet and 5FU)   Skin cancer    Stroke ([place])    Superficial basal cell carcinoma (BCC) 05/04/2007   Left  Shin (curet and 5FU)   Past Surgical History:  Procedure Laterality Date   ABDOMINAL HYSTERECTOMY     BILATERAL TOTAL MASTECTOMY WITH AXILLARY LYMPH NODE DISSECTION     CATARACT EXTRACTION     CHOLECYSTECTOMY     COLONOSCOPY N/A 10/07/2012   Procedure: COLONOSCOPY;  Surgeon: Arta Silence, MD;  Location: [facility] ENDOSCOPY;  Service: Endoscopy;  Laterality: N/A;   hip replacment     HIP SURGERY     JOINT REPLACEMENT     MASTECTOMY     REPLACEMENT TOTAL KNEE     shin repair     Social History:  reports that she has never smoked. She has never used smokeless tobacco. She reports that she does not drink alcohol and does not use drugs.  Allergies  Allergen Reactions   Codeine Nausea Only   Morphine And Related Anxiety and Other (See Comments)    Feel very unwell and out there   Sulfa Antibiotics Hives and Itching    Family History  Problem Relation Age of Onset   Glaucoma Mother    Coronary artery disease Father    Diabetes Sister    Diabetes Brother     Prior to Admission medications   Medication Sig Start Date End Date Taking? Authorizing Provider  acetaminophen (TYLENOL) 500 MG tablet Take 1,000 mg by mouth in the morning and at bedtime.    [provider]  furosemide (LASIX) 40 MG tablet Take 1 tablet (40 mg total) by mouth daily. 10/05/20   Sheikh, Omair Latif, DO  irbesartan (AVAPRO) 300 MG tablet Take 300 mg by mouth every morning.    [provider]  levothyroxine (SYNTHROID, LEVOTHROID) 88 MCG tablet Take 88 mcg by mouth daily before breakfast.    [provider]  memantine (NAMENDA) 10 MG tablet Take 10 mg by mouth at bedtime.    [provider]  metoprolol (LOPRESSOR) 50 MG tablet Take 50 mg by mouth 2 (two) times daily.    [provider]  oxyCODONE (OXY IR/ROXICODONE) 5 MG immediate release tablet Take 1 tablet (5 mg total) by mouth every 6 (six) hours as needed for breakthrough pain. 10/05/20   Raiford Noble Latif, DO   pantoprazole (PROTONIX) 20 MG tablet Take 1 tablet (20 mg total) by mouth daily. Patient not taking: No sig reported 10/14/2018   Charlesetta Shanks, MD  sertraline (ZOLOFT) 25 MG tablet Take 25 mg by mouth daily. 08/17/20   [provider]  triamcinolone cream (KENALOG) 0.1 % Apply 1 application topically 2 (two) times daily. Patient not taking: Reported on 10/02/2020 09/09/19   Lavonna Monarch, MD  vitamin B-12 1000 MCG tablet Take 1 tablet (1,000 mcg total) by mouth daily. 10/05/20   Raiford Noble [place], DO    Physical Exam: Vitals:   11/21/21 1925 11/21/21 2015 11/21/21 2029 11/21/21 2045  BP: (!) 157/62 (!) 148/72  (!) 174/53  Pulse: (!) 51 68  (!) 56  Resp: 20 17  (!) 28  Temp:   98 F (36.7 C)   TempSrc:   Oral   SpO2: 100% 99%  100%  Weight:      Height:       Constitutional: NAD, calm, comfortable Eyes: PERRL, lids and conjunctivae normal ENMT: Mucous membranes are moist. Posterior pharynx clear of any exudate or lesions.Normal dentition.  Neck: normal, supple, no masses, no thyromegaly Respiratory: clear to auscultation bilaterally, no wheezing, no crackles. Normal respiratory effort. No accessory muscle use.  Cardiovascular: Regular rate and rhythm, no murmurs / rubs / gallops. No extremity edema. 2+ pedal pulses. No carotid bruits.  Abdomen: Suprapubic TTP Musculoskeletal: no clubbing / cyanosis. No joint deformity upper and lower extremities. Good ROM, no contractures. Normal muscle tone.  Skin: no rashes, lesions, ulcers. No induration Neurologic: CN 2-12 grossly intact. Sensation intact, DTR normal. Strength 5/5 in all 4.  Psychiatric: Oriented to person and place only, not time.  Data Reviewed:    Urinalysis    Component Value Date/Time   COLORURINE YELLOW 11/21/2021 1928   APPEARANCEUR HAZY (A) 11/21/2021 1928   LABSPEC 1.018 11/21/2021 1928   PHURINE 5.0 11/21/2021 1928   GLUCOSEU NEGATIVE 11/21/2021 1928   HGBUR NEGATIVE 11/21/2021 1928   BILIRUBINUR  NEGATIVE 11/21/2021 1928   KETONESUR NEGATIVE 11/21/2021 1928   PROTEINUR NEGATIVE 11/21/2021 1928   UROBILINOGEN 0.2 02/09/2015 0158   NITRITE NEGATIVE 11/21/2021 1928   LEUKOCYTESUR LARGE (A) 11/21/2021 1928       Latest Ref Rng & Units 11/21/2021    5:00 PM 10/05/2020    5:42 AM 10/04/2020    5:12 AM  BMP  Glucose 70 - 99 mg/dL 102  129  118   BUN 8 - 23 mg/dL '31  20  24   '$ Creatinine 0.44 - 1.00 mg/dL 1.78  1.15  1.15   Sodium 135 - 145 mmol/L 142  140  142   Potassium 3.5 -  5.1 mmol/L 4.0  4.2  4.4   Chloride 98 - 111 mmol/L 110  110  112   CO2 22 - 32 mmol/L '25  25  27   '$ Calcium 8.9 - 10.3 mg/dL 9.3  8.9  8.6      Assessment and Plan: * UTI (urinary tract infection) Urinary symptoms + UA findings + lower abd TTP = high suspicion for UTI Empiric rocephin UCx pending  Acute metabolic encephalopathy Delirium from UTI + AKI superimposed on chronic dementia. Pt apparently very forgetful at baseline. Sounds like she needs at least ALF at baseline but hasnt been able to go due to financial situation per Son report. SW consult.  AKI (acute kidney injury) ([place]) AKI on CKD 3a. Creat 1.7 today up from baseline of ~1.2 it looks like Likely dehydration / pre-renal in setting of UTI + Delirium. IVF Strict intake and output Repeat BMP in AM Avoid nephrotoxic meds. Consider imaging to r/o obstruction if not rapidly resolving  CKD (chronic kidney disease) stage 3, GFR 30-59 ml/min (HCC) Looks like baseline creat ~1.2 historically.  HTN (hypertension) Cont BB when med rec completed Hold ARB + Diuretic in setting of AKI.  Hypothyroidism Resume synthroid when med-rec completed. May wish to slightly reduce dose as TSH today slightly low at 0.192. Update: will decrease synthroid dose to 34mg.      Advance Care Planning:   Code Status: DNR (Yellow form in chart, MOST form in chart, though son DOES want her admitted for IV fluids and ABx)  Consults: None  Family  Communication: Son at bedside  Severity of Illness: The appropriate patient status for this patient is OBSERVATION. Observation status is judged to be reasonable and necessary in order to provide the required intensity of service to ensure the patient's safety. The patient's presenting symptoms, physical exam findings, and initial radiographic and laboratory data in the context of their medical condition is felt to place them at decreased risk for further clinical deterioration. Furthermore, it is anticipated that the patient will be medically stable for discharge from the hospital within 2 midnights of admission.   Author: GEtta Quill, DO 11/21/2021 9:27 PM  For on call review [email or web address]

## 2021-11-21 NOTE — Assessment & Plan Note (Signed)
AKI on CKD 3a. Creat 1.7 today up from baseline of ~1.2 it looks like Likely dehydration / pre-renal in setting of UTI + Delirium. 1. IVF 2. Strict intake and output 3. Repeat BMP in AM 4. Avoid nephrotoxic meds. 5. Consider imaging to r/o obstruction if not rapidly resolving

## 2021-11-21 NOTE — ED Triage Notes (Signed)
BIBA Per EMS: Pt coming from home w/ c/o hallucinations & confusion x 1 week.  Hx dementia & UTI  VSS

## 2021-11-21 NOTE — Assessment & Plan Note (Signed)
Urinary symptoms + UA findings + lower abd TTP = high suspicion for UTI 1. Empiric rocephin 2. UCx pending

## 2021-11-21 NOTE — Assessment & Plan Note (Signed)
Cont BB when med rec completed Hold ARB + Diuretic in setting of AKI.

## 2021-11-21 NOTE — ED Provider Notes (Signed)
[facility] DEPT Provider Note   CSN: [id number] Arrival date & time: 11/21/21  1619     History  Chief Complaint  Patient presents with   Altered Mental Status    Tiffany [name] is a 88 y.o. female.  88 year old female presents to the ED via EMS from facility with [facility] [place].  According to staff, patient has been having hallucinations, confused from her baseline for the past week.  Patient does have a prior history of dementia, however has deviated from baseline.  Level 5 caveat due to dementia.  The history is provided by the patient and medical records.  Altered Mental Status Presenting symptoms: confusion and disorientation   Severity:  Mild Most recent episode:  More than 2 days ago Episode history:  Multiple Duration:  1 week Timing:  Intermittent      Home Medications Prior to Admission medications   Medication Sig Start Date End Date Taking? Authorizing Provider  acetaminophen (TYLENOL) 500 MG tablet Take 1,000 mg by mouth in the morning and at bedtime.    [provider]  furosemide (LASIX) 40 MG tablet Take 1 tablet (40 mg total) by mouth daily. 10/05/20   Sheikh, Omair Latif, DO  irbesartan (AVAPRO) 300 MG tablet Take 300 mg by mouth every morning.    [provider]  levothyroxine (SYNTHROID, LEVOTHROID) 88 MCG tablet Take 88 mcg by mouth daily before breakfast.    [provider]  memantine (NAMENDA) 10 MG tablet Take 10 mg by mouth at bedtime.    [provider]  metoprolol (LOPRESSOR) 50 MG tablet Take 50 mg by mouth 2 (two) times daily.    [provider]  oxyCODONE (OXY IR/ROXICODONE) 5 MG immediate release tablet Take 1 tablet (5 mg total) by mouth every 6 (six) hours as needed for breakthrough pain. 10/05/20   Raiford Noble Latif, DO  pantoprazole (PROTONIX) 20 MG tablet Take 1 tablet (20 mg total) by mouth daily. Patient not taking: No sig reported 10/21/2018   Charlesetta Shanks, MD   sertraline (ZOLOFT) 25 MG tablet Take 25 mg by mouth daily. 08/17/20   [provider]  triamcinolone cream (KENALOG) 0.1 % Apply 1 application topically 2 (two) times daily. Patient not taking: Reported on 10/02/2020 09/09/19   Lavonna Monarch, MD  vitamin B-12 1000 MCG tablet Take 1 tablet (1,000 mcg total) by mouth daily. 10/05/20   Raiford Noble Latif, DO      Allergies    Codeine, Morphine and related, and Sulfa antibiotics    Review of Systems   Review of Systems  Unable to perform ROS: Dementia  Psychiatric/Behavioral:  Positive for confusion.     Physical Exam Updated Vital Signs BP (!) 174/53   Pulse (!) 56   Temp 98 F (36.7 C) (Oral)   Resp (!) 28   Ht 5' 6.5" (1.689 m)   Wt 66.7 kg   SpO2 100%   BMI 23.37 kg/m  Physical Exam Vitals and nursing note reviewed.  Constitutional:      Appearance: Normal appearance.  HENT:     Head: Normocephalic and atraumatic.     Comments: No signs of head trauma.  No pain along the cervical spine.    Nose: Nose normal.  Cardiovascular:     Rate and Rhythm: Normal rate.     Comments: Signs of fluid overload, no pitting edema or calf tenderness. Pulmonary:     Effort: Pulmonary effort is normal.     Breath sounds: No  wheezing.     Comments: Lungs are clear to auscultation. Abdominal:     General: Abdomen is flat.     Tenderness: There is no abdominal tenderness.     Comments: Pain with palpation along the abdominal area.  Musculoskeletal:     Cervical back: Normal range of motion and neck supple.     Right lower leg: No edema.     Left lower leg: No edema.  Skin:    General: Skin is warm and dry.  Neurological:     Mental Status: Mental status is at baseline.     Comments: Patient alert to place, and self.     ED Results / Procedures / Treatments   Labs (all labs ordered are listed, but only abnormal results are displayed) Labs Reviewed  COMPREHENSIVE METABOLIC PANEL - Abnormal; Notable for the following  components:      Result Value   Glucose, Bld 102 (*)    BUN 31 (*)    Creatinine, Ser 1.78 (*)    Total Protein 6.4 (*)    GFR, Estimated 26 (*)    All other components within normal limits  CBC - Abnormal; Notable for the following components:   RBC 3.66 (*)    Hemoglobin 11.5 (*)    HCT 35.8 (*)    All other components within normal limits  URINALYSIS, ROUTINE W REFLEX MICROSCOPIC - Abnormal; Notable for the following components:   APPearance HAZY (*)    Leukocytes,Ua LARGE (*)    Bacteria, UA RARE (*)    All other components within normal limits  BRAIN NATRIURETIC PEPTIDE - Abnormal; Notable for the following components:   B Natriuretic Peptide 310.3 (*)    All other components within normal limits  TSH - Abnormal; Notable for the following components:   TSH 0.192 (*)    All other components within normal limits  CBG MONITORING, ED - Abnormal; Notable for the following components:   Glucose-Capillary 121 (*)    All other components within normal limits  URINE CULTURE  PROTIME-INR  CBC  BASIC METABOLIC PANEL    EKG EKG Interpretation  Date/Time:  Wednesday November 21 2021 17:48:11 EDT Ventricular Rate:  52 PR Interval:  211 QRS Duration: 140 QT Interval:  451 QTC Calculation: 420 R Axis:   67 Text Interpretation: Sinus rhythm Right bundle branch block Similar to 2021 tracing Confirmed by Nanda Quinton [phone]) on 11/21/2021 5:53:24 PM  Radiology CT HEAD WO CONTRAST (5MM)  Result Date: 11/21/2021 CLINICAL DATA:  Hallucinations and confusion EXAM: CT HEAD WITHOUT CONTRAST TECHNIQUE: Contiguous axial images were obtained from the base of the skull through the vertex without intravenous contrast. RADIATION DOSE REDUCTION: This exam was performed according to the departmental dose-optimization program which includes automated exposure control, adjustment of the mA and/or kV according to patient size and/or use of iterative reconstruction technique. COMPARISON:  10/01/2020  FINDINGS: Brain: No evidence of acute infarction, hemorrhage, cerebral edema, mass, mass effect, or midline shift. No hydrocephalus or extra-axial fluid collection. Redemonstrated encephalomalacia in the right temporoparietal region. Periventricular white matter changes, likely the sequela of chronic small vessel ischemic disease. Vascular: No hyperdense vessel. Atherosclerotic calcifications in the intracranial carotid and vertebral arteries. Skull: Hyperostosis frontalis.  No acute fracture or focal lesion. Sinuses/Orbits: Air-fluid levels in the sphenoid sinuses with bubbly fluid in the left sphenoid sinus. Status post bilateral lens replacements. Other: The mastoid air cells are well aerated. IMPRESSION: 1.  No acute intracranial process. 2. Air-fluid level with bubbly  fluid in the left sphenoid sinus, as can be seen with acute sinusitis. Correlate with symptoms. Electronically Signed   By: Merilyn Baba M.D.   On: 11/21/2021 19:00   DG Chest 2 View  Result Date: 11/21/2021 CLINICAL DATA:  Hallucinations and confusion. EXAM: CHEST - 2 VIEW COMPARISON:  Oct 01, 2020 FINDINGS: Tortuosity and calcific atherosclerotic disease of the aorta. Cardiomediastinal silhouette is normal. Mediastinal contours appear intact. Chronic elevation of the left hemidiaphragm. No focal airspace consolidation. Osseous structures are without acute abnormality. Soft tissues are grossly normal. IMPRESSION: No active cardiopulmonary disease. Electronically Signed   By: Fidela Salisbury M.D.   On: 11/21/2021 17:22    Procedures Procedures    Medications Ordered in ED Medications  cefTRIAXone (ROCEPHIN) 1 g in sodium chloride 0.9 % 100 mL IVPB (has no administration in time range)  lactated ringers infusion (has no administration in time range)  enoxaparin (LOVENOX) injection 30 mg (has no administration in time range)  acetaminophen (TYLENOL) tablet 650 mg (has no administration in time range)    Or  acetaminophen  (TYLENOL) suppository 650 mg (has no administration in time range)  ondansetron (ZOFRAN) tablet 4 mg (has no administration in time range)    Or  ondansetron (ZOFRAN) injection 4 mg (has no administration in time range)  sodium chloride 0.9 % bolus 250 mL (0 mLs Intravenous Stopped 11/21/21 2122)    ED Course/ Medical Decision Making/ A&P Clinical Course as of 11/21/21 2128  Wed Nov 21, 2021  2041 Leukocytes,Ua(!): LARGE [JS]  2041 WBC, UA: 21-50 [JS]    Clinical Course User Index [JS] Janeece Fitting, PA-C                           Medical Decision Making Amount and/or Complexity of Data Reviewed Labs: ordered. Decision-making details documented in ED Course. Radiology: ordered.  Risk Decision regarding hospitalization.    This patient presents to the ED for concern of AMS, this involves a number of treatment options, and is a complaint that carries with it a high risk of complications and morbidity.  The differential diagnosis includes CVA, UTI versus exacerbated dementia.    Co morbidities: Discussed in HPI   Brief History:  Patient with underlying hypertension, CKD 3, hypothyroid, previous CVA and dementia presents to the ED via EMS for worsening mental status.  Limited history due to ongoing history of dementia.  She does not voice any pain at this time.  She is alert to self, place.  EMR reviewed including pt PMHx, past surgical history and past visits to ER.   See HPI for more details   Lab Tests:  I ordered and independently interpreted labs.  The pertinent results include:    Labs notable for [facility] with no abnormalities.  CMP with a worsening creatinine along with GFR.  BUN slightly elevated today.  According to son patient has had decrease in p.o. intake over the last couple of days.  BNP elevated at 310 however without any signs of volume overload on exam.  UA with 21-50 white blood cell count, large leukocytes but no nitrites.   Imaging Studies:  CT Head  showed: 1.  No acute intracranial process.  2. Air-fluid level with bubbly fluid in the left sphenoid sinus, as  can be seen with acute sinusitis. Correlate with symptoms.   Cardiac Monitoring:  The patient was maintained on a cardiac monitor.  I personally viewed and interpreted the cardiac monitored  which showed an underlying rhythm of: EKG non-ischemic   Medicines ordered:  I ordered medication including 250 ml  for hydration Reevaluation of the patient after these medicines showed that the patient stayed the same I have reviewed the patients home medicines and have made adjustments as needed Given rocephin 1g for UTI.   Reevaluation:  After the interventions noted above I re-evaluated patient and found that they have :stayed the same   Social Determinants of Health:  The patient's social determinants of health were a factor in the care of this patient Patient currently lives in independent facility however needs assisted living facility placement, TOC was involved.    Problem List / ED Course:  Patient here with worsen mental status, currently lives in independent living.  Spoke to son [name] at the bedside who reported patient has had changes in her mental status over the past 3 days.  Over the weekend patient packed all her belongings and trying to walk out of her independent living facility.  In addition, she tried to exit the building a couple times and was redirected back into her apartment.  According to son, he lives 20 miles away and she is only able to be cared for by a person that sees her for 3 hours a day.  He reports changing her depends last week, noted a foul odor to her urine which caused him to vomit.  He reports wanting to move her to assisted living, however he is unable to do this due to financial cost. UA significant for urinary tract infection, given Rocephin 1 g.  I do feel that patient currently living at independent living, would need further assistance  with medication compliance and she is not safe to be discharged home at this time.  Dispostion:  After consideration of the diagnostic results and the patients response to treatment, I feel that the patent would benefit from admission for further management of UTI, worsening AKI along with worsening mental status.   Portions of this note were generated with Lobbyist. Dictation errors may occur despite best attempts at proofreading.   Final Clinical Impression(s) / ED Diagnoses Final diagnoses:  Disorientation  Lower urinary tract infectious disease    Rx / DC Orders ED Discharge Orders     None         Janeece Fitting, Hershal Coria 11/21/21 2128    Margette Fast, MD 11/28/21 [phone]

## 2021-11-21 NOTE — Assessment & Plan Note (Signed)
Looks like baseline creat ~1.2 historically.

## 2021-11-21 NOTE — Assessment & Plan Note (Addendum)
Delirium from UTI + AKI superimposed on chronic dementia. Pt apparently very forgetful at baseline. Sounds like she needs at least ALF at baseline but hasnt been able to go due to financial situation per Son report. SW consult.

## 2021-11-22 ENCOUNTER — Observation Stay (HOSPITAL_COMMUNITY): Payer: Medicare HMO

## 2021-11-22 DIAGNOSIS — R4182 Altered mental status, unspecified: Secondary | ICD-10-CM | POA: Diagnosis not present

## 2021-11-22 DIAGNOSIS — G9341 Metabolic encephalopathy: Secondary | ICD-10-CM | POA: Diagnosis not present

## 2021-11-22 DIAGNOSIS — I129 Hypertensive chronic kidney disease with stage 1 through stage 4 chronic kidney disease, or unspecified chronic kidney disease: Secondary | ICD-10-CM | POA: Diagnosis not present

## 2021-11-22 DIAGNOSIS — Z885 Allergy status to narcotic agent status: Secondary | ICD-10-CM | POA: Diagnosis not present

## 2021-11-22 DIAGNOSIS — F0392 Unspecified dementia, unspecified severity, with psychotic disturbance: Secondary | ICD-10-CM | POA: Diagnosis present

## 2021-11-22 DIAGNOSIS — R69 Illness, unspecified: Secondary | ICD-10-CM | POA: Diagnosis not present

## 2021-11-22 DIAGNOSIS — Z90710 Acquired absence of both cervix and uterus: Secondary | ICD-10-CM | POA: Diagnosis not present

## 2021-11-22 DIAGNOSIS — N1831 Chronic kidney disease, stage 3a: Secondary | ICD-10-CM | POA: Diagnosis not present

## 2021-11-22 DIAGNOSIS — E032 Hypothyroidism due to medicaments and other exogenous substances: Secondary | ICD-10-CM | POA: Diagnosis not present

## 2021-11-22 DIAGNOSIS — Z66 Do not resuscitate: Secondary | ICD-10-CM | POA: Diagnosis not present

## 2021-11-22 DIAGNOSIS — M810 Age-related osteoporosis without current pathological fracture: Secondary | ICD-10-CM | POA: Diagnosis not present

## 2021-11-22 DIAGNOSIS — Z79899 Other long term (current) drug therapy: Secondary | ICD-10-CM | POA: Diagnosis not present

## 2021-11-22 DIAGNOSIS — E860 Dehydration: Secondary | ICD-10-CM | POA: Diagnosis not present

## 2021-11-22 DIAGNOSIS — E440 Moderate protein-calorie malnutrition: Secondary | ICD-10-CM | POA: Diagnosis not present

## 2021-11-22 DIAGNOSIS — Z9013 Acquired absence of bilateral breasts and nipples: Secondary | ICD-10-CM | POA: Diagnosis not present

## 2021-11-22 DIAGNOSIS — Z8673 Personal history of transient ischemic attack (TIA), and cerebral infarction without residual deficits: Secondary | ICD-10-CM | POA: Diagnosis not present

## 2021-11-22 DIAGNOSIS — N179 Acute kidney failure, unspecified: Secondary | ICD-10-CM | POA: Diagnosis not present

## 2021-11-22 DIAGNOSIS — Z9049 Acquired absence of other specified parts of digestive tract: Secondary | ICD-10-CM | POA: Diagnosis not present

## 2021-11-22 DIAGNOSIS — Z86010 Personal history of colonic polyps: Secondary | ICD-10-CM | POA: Diagnosis not present

## 2021-11-22 DIAGNOSIS — Z882 Allergy status to sulfonamides status: Secondary | ICD-10-CM | POA: Diagnosis not present

## 2021-11-22 DIAGNOSIS — Z6823 Body mass index (BMI) 23.0-23.9, adult: Secondary | ICD-10-CM | POA: Diagnosis not present

## 2021-11-22 DIAGNOSIS — Z85828 Personal history of other malignant neoplasm of skin: Secondary | ICD-10-CM | POA: Diagnosis not present

## 2021-11-22 DIAGNOSIS — Z853 Personal history of malignant neoplasm of breast: Secondary | ICD-10-CM | POA: Diagnosis not present

## 2021-11-22 DIAGNOSIS — Z79890 Hormone replacement therapy (postmenopausal): Secondary | ICD-10-CM | POA: Diagnosis not present

## 2021-11-22 DIAGNOSIS — N390 Urinary tract infection, site not specified: Secondary | ICD-10-CM | POA: Diagnosis not present

## 2021-11-22 LAB — CBC
HCT: 29.6 % — ABNORMAL LOW (ref 36.0–46.0)
Hemoglobin: 9.2 g/dL — ABNORMAL LOW (ref 12.0–15.0)
MCH: 31 pg (ref 26.0–34.0)
MCHC: 31.1 g/dL (ref 30.0–36.0)
MCV: 99.7 fL (ref 80.0–100.0)
Platelets: 134 10*3/uL — ABNORMAL LOW (ref 150–400)
RBC: 2.97 MIL/uL — ABNORMAL LOW (ref 3.87–5.11)
RDW: 13.2 % (ref 11.5–15.5)
WBC: 4.6 10*3/uL (ref 4.0–10.5)
nRBC: 0 % (ref 0.0–0.2)

## 2021-11-22 LAB — BASIC METABOLIC PANEL
Anion gap: 4 — ABNORMAL LOW (ref 5–15)
BUN: 23 mg/dL (ref 8–23)
CO2: 26 mmol/L (ref 22–32)
Calcium: 8.7 mg/dL — ABNORMAL LOW (ref 8.9–10.3)
Chloride: 111 mmol/L (ref 98–111)
Creatinine, Ser: 1.27 mg/dL — ABNORMAL HIGH (ref 0.44–1.00)
GFR, Estimated: 40 mL/min — ABNORMAL LOW (ref >60)
Glucose, Bld: 104 mg/dL — ABNORMAL HIGH (ref 70–99)
Potassium: 3.6 mmol/L (ref 3.5–5.1)
Sodium: 141 mmol/L (ref 135–145)

## 2021-11-22 MED ORDER — HYDRALAZINE HCL 25 MG PO TABS
25.0000 mg | ORAL_TABLET | Freq: Four times a day (QID) | ORAL | Status: DC | PRN
  Administered 2021-11-22: 25 mg via ORAL
  Filled 2021-11-22: qty 1

## 2021-11-22 MED ORDER — SODIUM CHLORIDE 0.9 % IV SOLN
1.0000 g | INTRAVENOUS | Status: DC
  Administered 2021-11-22 – 2021-11-24 (×3): 1 g via INTRAVENOUS
  Filled 2021-11-22 (×3): qty 10

## 2021-11-22 NOTE — Progress Notes (Signed)
PROGRESS NOTE  Tiffany [name] PJK:[id number] DOB: 01-08-1927 DOA: 11/21/2021 PCP: Donald Prose, MD   LOS: 0 days   Brief Narrative / Interim history: 88 year old female with history of dementia, hypothyroidism, hypertension, comes into the hospital for a weeks long worsening of mental status, hallucinations, and increased confusion from baseline.  This is similar to her prior episodes of UTI, she also had foul-smelling urine in the past week.  Subjective / 24h Interval events: Confused, watching TV. Appears comfortable. Unaware of location, time, situation, which appears off her baseline per son  Assesement and Plan: Principal Problem:   UTI (urinary tract infection) Active Problems:   Acute metabolic encephalopathy   AKI (acute kidney injury) ([facility])   CKD (chronic kidney disease) stage 3, GFR 30-59 ml/min ([facility])   HTN (hypertension)   Hypothyroidism   Principal problem UTI (urinary tract infection) -continue Ceftriaxone. Son tells me that she has been having significantly foul smelling urine, worse in the past week. Urine cultures pending, based on prior microbiology, using Ceftriaxone for now  Active problems Acute metabolic encephalopathy, underlying dementia - there is a component of progressive dementia, worsening in the past 2-3 weeks likely in the setting of her underlying UTI. Son tells me that she has declined overall in the 2-3 years, still living by herself but increasingly doing unsafe things in the home, such as starting on the oven to dry out her clothes and trying to leave to "move to [place]".  Her current living situation seems to be unsafe as she is living by herself in an independent living facility and the son is able to visit her but once or twice daily max  AKI (acute kidney injury) on chronic kidney disease stage IIIa-baseline creatinine 1.2, up to 1.7, likely due to poor p.o. intake per son.  Creatinine has reached baseline with fluids   HTN (hypertension)  -Home metoprolol.  Hold ARB and diuretic in the setting of AKI  Hypothyroidism-continue Synthroid.  TSH slightly low at 0.192, decrease Synthroid    Scheduled Meds:  acetaminophen  1,000 mg Oral BID   enoxaparin (LOVENOX) injection  30 mg Subcutaneous Q24H   levothyroxine  75 mcg Oral Q0600   metoprolol tartrate  50 mg Oral BID   Continuous Infusions:  lactated ringers 125 mL/hr at 11/22/21 0928   PRN Meds:.acetaminophen **OR** acetaminophen, loperamide HCl, ondansetron **OR** ondansetron (ZOFRAN) IV  Diet Orders (From admission, onward)     Start     Ordered   11/21/21 2115  Diet Heart Room service appropriate? Yes; Fluid consistency: Thin  Diet effective now       Question Answer Comment  Room service appropriate? Yes   Fluid consistency: Thin      11/21/21 2114            DVT prophylaxis: enoxaparin (LOVENOX) injection 30 mg Start: 11/22/21 1000   Lab Results  Component Value Date   PLT 134 (L) 11/22/2021      Code Status: DNR  Family Communication: Son updated over the phone  Status is: Observation The patient will require care spanning > 2 midnights and should be moved to inpatient because: Persistent encephalopathy, confusion  Level of care: Med-Surg  Consultants:  none  Objective: Vitals:   11/22/21 0200 11/22/21 0447 11/22/21 0600 11/22/21 0601  BP: (!) 164/47 (!) 160/96 (!) 174/61   Pulse: (!) 53 64 61   Resp: 16 16 (!) 26   Temp:    98.3 F (36.8 C)  TempSrc:  Oral  SpO2: 99% 92% 98%   Weight:      Height:        Intake/Output Summary (Last 24 hours) at 11/22/2021 1002 Last data filed at 11/21/2021 2231 Gross per 24 hour  Intake 350 ml  Output --  Net 350 ml   Wt Readings from Last 3 Encounters:  11/21/21 66.7 kg  10/02/20 67.1 kg  02/02/19 59 kg    Examination:  Constitutional: NAD Eyes: no scleral icterus ENMT: Mucous membranes are moist.  Neck: normal, supple Respiratory: clear to auscultation bilaterally, no wheezing,  no crackles. Normal respiratory effort. No accessory muscle use.  Cardiovascular: Regular rate and rhythm, no murmurs / rubs / gallops. No LE edema.  Abdomen: non distended, no tenderness. Bowel sounds positive.  Musculoskeletal: no clubbing / cyanosis.  Skin: no rashes Neurologic: non focal   Data Reviewed: I have independently reviewed following labs and imaging studies   CBC Recent Labs  Lab 11/21/21 1700 11/22/21 0245  WBC 6.0 4.6  HGB 11.5* 9.2*  HCT 35.8* 29.6*  PLT 176 134*  MCV 97.8 99.7  MCH 31.4 31.0  MCHC 32.1 31.1  RDW 13.2 13.2    Recent Labs  Lab 11/21/21 1700 11/22/21 0245  NA 142 141  K 4.0 3.6  CL 110 111  CO2 25 26  GLUCOSE 102* 104*  BUN 31* 23  CREATININE 1.78* 1.27*  CALCIUM 9.3 8.7*  AST 16  --   ALT 11  --   ALKPHOS 75  --   BILITOT 0.4  --   ALBUMIN 3.6  --   INR 0.9  --   TSH 0.192*  --   BNP 310.3*  --     ------------------------------------------------------------------------------------------------------------------ No results for input(s): "CHOL", "HDL", "LDLCALC", "TRIG", "CHOLHDL", "LDLDIRECT" in the last 72 hours.  Lab Results  Component Value Date   HGBA1C 6.0 (H) 12/23/2017   ------------------------------------------------------------------------------------------------------------------ Recent Labs    11/21/21 1700  TSH 0.192*    Cardiac Enzymes No results for input(s): "CKMB", "TROPONINI", "MYOGLOBIN" in the last 168 hours.  Invalid input(s): "CK" ------------------------------------------------------------------------------------------------------------------    Component Value Date/Time   BNP 310.3 (H) 11/21/2021 1700    CBG: Recent Labs  Lab 11/21/21 1651  GLUCAP 121*    No results found for this or any previous visit (from the past 240 hour(s)).   Radiology Studies: CT HEAD WO CONTRAST (5MM)  Result Date: 11/21/2021 CLINICAL DATA:  Hallucinations and confusion EXAM: CT HEAD WITHOUT CONTRAST  TECHNIQUE: Contiguous axial images were obtained from the base of the skull through the vertex without intravenous contrast. RADIATION DOSE REDUCTION: This exam was performed according to the departmental dose-optimization program which includes automated exposure control, adjustment of the mA and/or kV according to patient size and/or use of iterative reconstruction technique. COMPARISON:  10/01/2020 FINDINGS: Brain: No evidence of acute infarction, hemorrhage, cerebral edema, mass, mass effect, or midline shift. No hydrocephalus or extra-axial fluid collection. Redemonstrated encephalomalacia in the right temporoparietal region. Periventricular white matter changes, likely the sequela of chronic small vessel ischemic disease. Vascular: No hyperdense vessel. Atherosclerotic calcifications in the intracranial carotid and vertebral arteries. Skull: Hyperostosis frontalis.  No acute fracture or focal lesion. Sinuses/Orbits: Air-fluid levels in the sphenoid sinuses with bubbly fluid in the left sphenoid sinus. Status post bilateral lens replacements. Other: The mastoid air cells are well aerated. IMPRESSION: 1.  No acute intracranial process. 2. Air-fluid level with bubbly fluid in the left sphenoid sinus, as can be seen with acute sinusitis.  Correlate with symptoms. Electronically Signed   By: Merilyn Baba M.D.   On: 11/21/2021 19:00   DG Chest 2 View  Result Date: 11/21/2021 CLINICAL DATA:  Hallucinations and confusion. EXAM: CHEST - 2 VIEW COMPARISON:  Oct 01, 2020 FINDINGS: Tortuosity and calcific atherosclerotic disease of the aorta. Cardiomediastinal silhouette is normal. Mediastinal contours appear intact. Chronic elevation of the left hemidiaphragm. No focal airspace consolidation. Osseous structures are without acute abnormality. Soft tissues are grossly normal. IMPRESSION: No active cardiopulmonary disease. Electronically Signed   By: Fidela Salisbury M.D.   On: 11/21/2021 17:22     Marzetta Board,  MD, PhD [facility]  Between 7 am - 7 pm I am available, please contact me via Amion (for emergencies) or Securechat (non urgent messages)  Between 7 pm - 7 am I am not available, please contact night coverage MD/APP via Amion

## 2021-11-22 NOTE — Evaluation (Signed)
Physical Therapy Evaluation Patient Details Name: Tiffany [name] MRN: [id number] DOB: 02-05-1927 Today's Date: 11/22/2021  History of Present Illness  88 yo female brought from [place] with worsening MS, Hallucinations, eloping attempts, UTI. WUJ:[id number], HTN, CKD, hypothyroid, skina nd breast cancer and CVA.  Clinical Impression  The patient  is pleasant,and confused, looking for her "pink bunny". Patient required supervision for mobility, Patient  limited ambulation stating that she was cold and returned self back into bed. Patient comes from [facility] and  as documented was ambulatory as   she attempted to leave facility.  Patient  will benefit from higher level of care with no acute PT follow-up needs needs at DC. PT will follow or turn over to Mobility Specialist if remains in hospital to  prevent further decline.     Recommendations for follow up therapy are one component of a multi-disciplinary discharge planning process, led by the attending physician.  Recommendations may be updated based on patient status, additional functional criteria and insurance authorization.  Follow Up Recommendations Long-term institutional care without follow-up therapy Can patient physically be transported by private vehicle: Yes    Assistance Recommended at Discharge Intermittent Supervision/Assistance  Patient can return home with the following  Assistance with cooking/housework;Assist for transportation;Help with stairs or ramp for entrance;Direct supervision/assist for financial management;A little help with bathing/dressing/bathroom    Equipment Recommendations None recommended by PT  Recommendations for Other Services       Functional Status Assessment Patient has not had a recent decline in their functional status     Precautions / Restrictions Precautions Precautions: Fall      Mobility  Bed Mobility Overal bed mobility: Needs Assistance             General bed  mobility comments: assist with lines    Transfers Overall transfer level: Needs assistance Equipment used: Rolling walker (2 wheels) Transfers: Sit to/from Stand Sit to Stand: Min assist           General transfer comment: patient safety as patient kept trying to get  back into bed, "I am cold"    Ambulation/Gait Ambulation/Gait assistance: Min assist   Assistive device: Rolling walker (2 wheels)         General Gait Details: side steps along the bed.  Stairs            Wheelchair Mobility    Modified Rankin (Stroke Patients Only)       Balance Overall balance assessment: Needs assistance Sitting-balance support: Feet supported, No upper extremity supported Sitting balance-Leahy Scale: Fair     Standing balance support: During functional activity, Bilateral upper extremity supported Standing balance-Leahy Scale: Fair                               Pertinent Vitals/Pain Pain Assessment Pain Assessment: No/denies pain Breathing: normal Negative Vocalization: none Facial Expression: smiling or inexpressive Body Language: relaxed Consolability: no need to console PAINAD Score: 0    Home Living Family/patient expects to be discharged to::  (needs ALF from ILF) Living Arrangements: Alone                 Additional Comments: has someone 3 hours per day, unsure of care provided    Prior Function Prior Level of Function : Patient poor historian/Family not available             Mobility Comments: from ILF, documented that she  ambulates as  she  packed her bags and attempted to leave on multiple occasssions.       Hand Dominance   Dominant Hand: Right    Extremity/Trunk Assessment   Upper Extremity Assessment Upper Extremity Assessment: Overall WFL for tasks assessed    Lower Extremity Assessment Lower Extremity Assessment: Overall WFL for tasks assessed    Cervical / Trunk Assessment Cervical / Trunk Assessment:  Kyphotic  Communication   Communication: No difficulties  Cognition Arousal/Alertness: Awake/alert Behavior During Therapy: WFL for tasks assessed/performed Overall Cognitive Status: No family/caregiver present to determine baseline cognitive functioning                                 General Comments: Upon PT entrance, patient states that she is looking for her pink bunny. patient oriented to self and hospital, stated her BD is next week(10/30/28), stated her son lives in [place] then stated that he gets her groceries.        General Comments      Exercises     Assessment/Plan    PT Assessment Patient needs continued PT services (or  mobility services while in hospital)  PT Problem List Decreased mobility;Decreased cognition       PT Treatment Interventions Gait training;Cognitive remediation;Functional mobility training    PT Goals (Current goals can be found in the Care Plan section)  Acute Rehab PT Goals PT Goal Formulation: Patient unable to participate in goal setting Time For Goal Achievement: 12/06/21 Potential to Achieve Goals: Good    Frequency Min 2X/week     Co-evaluation               AM-PAC PT "6 Clicks" Mobility  Outcome Measure Help needed turning from your back to your side while in a flat bed without using bedrails?: None Help needed moving from lying on your back to sitting on the side of a flat bed without using bedrails?: None Help needed moving to and from a bed to a chair (including a wheelchair)?: A Little Help needed standing up from a chair using your arms (e.g., wheelchair or bedside chair)?: A Little Help needed to walk in hospital room?: A Little Help needed climbing 3-5 steps with a railing? : A Lot 6 Click Score: 19    End of Session   Activity Tolerance: Patient tolerated treatment well Patient left: in bed;with call bell/phone within reach Nurse Communication: Mobility status      Time: 5456-2563 PT Time  Calculation (min) (ACUTE ONLY): 18 min   Charges:   PT Evaluation $PT Eval Low Complexity: [place] Office [phone] Weekend [phone]   Claretha Cooper 11/22/2021, 9:10 AM

## 2021-11-22 NOTE — Plan of Care (Signed)
?  Problem: Coping: ?Goal: Level of anxiety will decrease ?Outcome: Progressing ?  ?Problem: Safety: ?Goal: Ability to remain free from injury will improve ?Outcome: Progressing ?  ?

## 2021-11-22 NOTE — ED Notes (Signed)
Pt given am synthroid medication. Takes each pill one at a time, endorses difficulty swallowing. Pt repositioned sitting up. TV on news channel per pt request. Call bell within reach. Pt denies any additional needs.

## 2021-11-23 DIAGNOSIS — N390 Urinary tract infection, site not specified: Secondary | ICD-10-CM | POA: Diagnosis not present

## 2021-11-23 LAB — CBC
HCT: 31.6 % — ABNORMAL LOW (ref 36.0–46.0)
Hemoglobin: 10 g/dL — ABNORMAL LOW (ref 12.0–15.0)
MCH: 31.3 pg (ref 26.0–34.0)
MCHC: 31.6 g/dL (ref 30.0–36.0)
MCV: 98.8 fL (ref 80.0–100.0)
Platelets: 129 10*3/uL — ABNORMAL LOW (ref 150–400)
RBC: 3.2 MIL/uL — ABNORMAL LOW (ref 3.87–5.11)
RDW: 13.3 % (ref 11.5–15.5)
WBC: 4.5 10*3/uL (ref 4.0–10.5)
nRBC: 0 % (ref 0.0–0.2)

## 2021-11-23 LAB — BASIC METABOLIC PANEL
Anion gap: 6 (ref 5–15)
BUN: 17 mg/dL (ref 8–23)
CO2: 24 mmol/L (ref 22–32)
Calcium: 8.8 mg/dL — ABNORMAL LOW (ref 8.9–10.3)
Chloride: 113 mmol/L — ABNORMAL HIGH (ref 98–111)
Creatinine, Ser: 1.14 mg/dL — ABNORMAL HIGH (ref 0.44–1.00)
GFR, Estimated: 45 mL/min — ABNORMAL LOW (ref >60)
Glucose, Bld: 97 mg/dL (ref 70–99)
Potassium: 3.8 mmol/L (ref 3.5–5.1)
Sodium: 143 mmol/L (ref 135–145)

## 2021-11-23 MED ORDER — ENSURE ENLIVE PO LIQD
237.0000 mL | Freq: Two times a day (BID) | ORAL | Status: DC
  Administered 2021-11-23 – 2021-11-25 (×5): 237 mL via ORAL

## 2021-11-23 MED ORDER — ADULT MULTIVITAMIN W/MINERALS CH
1.0000 | ORAL_TABLET | Freq: Every day | ORAL | Status: DC
Start: 2021-11-23 — End: 2021-11-26
  Administered 2021-11-23 – 2021-11-26 (×4): 1 via ORAL
  Filled 2021-11-23 (×4): qty 1

## 2021-11-23 NOTE — Progress Notes (Signed)
Initial Nutrition Assessment  DOCUMENTATION CODES:   Non-severe (moderate) malnutrition in context of chronic illness  INTERVENTION:   -Ensure Plus High Protein po BID, each supplement provides 350 kcal and 20 grams of protein.   -Multivitamin with minerals daily  -Liberalized heart healthy diet to regular  NUTRITION DIAGNOSIS:   Moderate Malnutrition related to chronic illness (dementia) as evidenced by moderate fat depletion, severe muscle depletion.  GOAL:   Patient will meet greater than or equal to 90% of their needs  MONITOR:   PO intake, Supplement acceptance, Weight trends, Labs, I & O's  REASON FOR ASSESSMENT:   Malnutrition Screening Tool    ASSESSMENT:   88 year old female with history of dementia, hypothyroidism, hypertension, comes into the hospital for a weeks long worsening of mental status, hallucinations, and increased confusion from baseline.  This is similar to her prior episodes of UTI, she also had foul-smelling urine in the past week.  Patient in room, no family present at time of visit. Pt states she has a good appetite. She tries to consume soft foods, does not eat meat other than some fish like tuna. Likes the Ensure that was ordered, drank all of it already. Will change diet to regular to maximize menu options.   Per weight records, no weight loss noted.  Medications: Lactated ringers  Labs reviewed.  NUTRITION - FOCUSED PHYSICAL EXAM:  Flowsheet Row Most Recent Value  Orbital Region Moderate depletion  Upper Arm Region Severe depletion  Thoracic and Lumbar Region Unable to assess  Buccal Region Moderate depletion  Temple Region Moderate depletion  Clavicle Bone Region Severe depletion  Clavicle and Acromion Bone Region Severe depletion  Scapular Bone Region Severe depletion  Dorsal Hand Severe depletion  Patellar Region Unable to assess  Anterior Thigh Region Unable to assess  Posterior Calf Region Unable to assess  Edema (RD  Assessment) None  Hair Reviewed  Eyes Reviewed  Mouth Reviewed  Skin Reviewed  Nails Reviewed       Diet Order:   Diet Order             Diet regular Room service appropriate? No; Fluid consistency: Thin  Diet effective now                   EDUCATION NEEDS:   No education needs have been identified at this time  Skin:  Skin Assessment: Reviewed RN Assessment  Last BM:  7/19  Height:   Ht Readings from Last 1 Encounters:  11/21/21 5' 6.5" (1.689 m)    Weight:   Wt Readings from Last 1 Encounters:  11/21/21 66.7 kg    BMI:  Body mass index is 23.37 kg/m.  Estimated Nutritional Needs:   Kcal:  1600-1800  Protein:  75-85g  Fluid:  1.8L/day   Clayton Bibles, MS, RD, LDN Inpatient Clinical Dietitian Contact information available via Amion

## 2021-11-23 NOTE — Progress Notes (Signed)
PROGRESS NOTE  Tiffany [name] MGQ:[id number] DOB: 06-27-1926 DOA: 11/21/2021 PCP: Donald Prose, MD   LOS: 1 day   Brief Narrative / Interim history: 88 year old female with history of dementia, hypothyroidism, hypertension, comes into the hospital for a weeks long worsening of mental status, hallucinations, and increased confusion from baseline.  This is similar to her prior episodes of UTI, she also had foul-smelling urine in the past week.  Subjective / 24h Interval events: Confused this morning.  She appears comfortable, no chest pain, no shortness of breath.  Assesement and Plan: Principal Problem:   UTI (urinary tract infection) Active Problems:   Acute metabolic encephalopathy   AKI (acute kidney injury) ([facility])   CKD (chronic kidney disease) stage 3, GFR 30-59 ml/min (HCC)   HTN (hypertension)   Hypothyroidism   Principal problem UTI (urinary tract infection) -continue Ceftriaxone. Son tells me that she has been having significantly foul smelling urine, worse in the past week. Urine cultures showing gram-negative rods today, keep on ceftriaxone and monitor final results  Active problems Acute metabolic encephalopathy, underlying dementia - there is a component of progressive dementia, worsening in the past 2-3 weeks likely in the setting of her underlying UTI. Son tells me that she has declined overall in the 2-3 years, still living by herself but increasingly doing unsafe things in the home, such as starting on the oven to dry out her clothes and trying to leave to "move to [place]".  Her current living situation seems to be unsafe as she is living by herself in an independent living facility and the son is able to visit her but once or twice daily max  AKI (acute kidney injury) on chronic kidney disease stage IIIa-creatinine has normalized with fluids   HTN (hypertension) -Home metoprolol.  Hold ARB and diuretic in the setting of AKI  Hypothyroidism-continue Synthroid.   TSH slightly low at 0.192, decrease Synthroid    Scheduled Meds:  acetaminophen  1,000 mg Oral BID   enoxaparin (LOVENOX) injection  30 mg Subcutaneous Q24H   feeding supplement  237 mL Oral BID BM   levothyroxine  75 mcg Oral Q0600   metoprolol tartrate  50 mg Oral BID   Continuous Infusions:  cefTRIAXone (ROCEPHIN)  IV 1 g (11/22/21 2003)   lactated ringers 125 mL/hr at 11/23/21 0618   PRN Meds:.acetaminophen **OR** acetaminophen, hydrALAZINE, loperamide HCl, ondansetron **OR** ondansetron (ZOFRAN) IV  Diet Orders (From admission, onward)     Start     Ordered   11/23/21 0935  Diet regular Room service appropriate? No; Fluid consistency: Thin  Diet effective now       Question Answer Comment  Room service appropriate? No   Fluid consistency: Thin      11/23/21 0935            DVT prophylaxis: enoxaparin (LOVENOX) injection 30 mg Start: 11/22/21 1000   Lab Results  Component Value Date   PLT 129 (L) 11/23/2021      Code Status: DNR  Family Communication: Son updated over the phone    Persistent encephalopathy, confusion  Level of care: Med-Surg  Consultants:  none  Objective: Vitals:   11/22/21 1836 11/22/21 2305 11/23/21 0143 11/23/21 0553  BP: (!) 187/57 95/83 102/88 (!) 133/93  Pulse: (!) 57 60 60 66  Resp:  '18 18 18  '$ Temp:  (!) 97.5 F (36.4 C) (!) 97.4 F (36.3 C) 98.3 F (36.8 C)  TempSrc:  Oral Oral Oral  SpO2: 100% 96% 97%  95%  Weight:      Height:        Intake/Output Summary (Last 24 hours) at 11/23/2021 1031 Last data filed at 11/23/2021 1000 Gross per 24 hour  Intake 1979.94 ml  Output 700 ml  Net 1279.94 ml    Wt Readings from Last 3 Encounters:  11/21/21 66.7 kg  10/02/20 67.1 kg  02/02/19 59 kg    Examination:  Constitutional: NAD Eyes: lids and conjunctivae normal, no scleral icterus ENMT: mmm Neck: normal, supple Respiratory: clear to auscultation bilaterally, no wheezing, no crackles. Normal respiratory effort.   Cardiovascular: Regular rate and rhythm, no murmurs / rubs / gallops. No LE edema. Abdomen: soft, no distention, no tenderness. Bowel sounds positive.  Skin: no rashes Neurologic: no focal deficits, equal strength   Data Reviewed: I have independently reviewed following labs and imaging studies   CBC Recent Labs  Lab 11/21/21 1700 11/22/21 0245 11/23/21 0337  WBC 6.0 4.6 4.5  HGB 11.5* 9.2* 10.0*  HCT 35.8* 29.6* 31.6*  PLT 176 134* 129*  MCV 97.8 99.7 98.8  MCH 31.4 31.0 31.3  MCHC 32.1 31.1 31.6  RDW 13.2 13.2 13.3     Recent Labs  Lab 11/21/21 1700 11/22/21 0245 11/23/21 0337  NA 142 141 143  K 4.0 3.6 3.8  CL 110 111 113*  CO2 '25 26 24  '$ GLUCOSE 102* 104* 97  BUN 31* 23 17  CREATININE 1.78* 1.27* 1.14*  CALCIUM 9.3 8.7* 8.8*  AST 16  --   --   ALT 11  --   --   ALKPHOS 75  --   --   BILITOT 0.4  --   --   ALBUMIN 3.6  --   --   INR 0.9  --   --   TSH 0.192*  --   --   BNP 310.3*  --   --      ------------------------------------------------------------------------------------------------------------------ No results for input(s): "CHOL", "HDL", "LDLCALC", "TRIG", "CHOLHDL", "LDLDIRECT" in the last 72 hours.  Lab Results  Component Value Date   HGBA1C 6.0 (H) 12/23/2017   ------------------------------------------------------------------------------------------------------------------ Recent Labs    11/21/21 1700  TSH 0.192*     Cardiac Enzymes No results for input(s): "CKMB", "TROPONINI", "MYOGLOBIN" in the last 168 hours.  Invalid input(s): "CK" ------------------------------------------------------------------------------------------------------------------    Component Value Date/Time   BNP 310.3 (H) 11/21/2021 1700    CBG: Recent Labs  Lab 11/21/21 1651  GLUCAP 121*     Recent Results (from the past 240 hour(s))  Urine Culture     Status: Abnormal (Preliminary result)   Collection Time: 11/21/21  7:28 PM   Specimen: Urine,  Clean Catch  Result Value Ref Range Status   Specimen Description   Final    URINE, CLEAN CATCH Performed at [facility], [place] [address], [place], [place] [address]    Special Requests   Final    NONE Performed at [facility], [place] [address] [place], [place] [address]    Culture (A)  Final    >=100,000 COLONIES/mL GRAM NEGATIVE RODS IDENTIFICATION AND SUSCEPTIBILITIES TO FOLLOW CULTURE REINCUBATED FOR BETTER GROWTH Performed at [facility], [place] [address] [place], [place] [address]    Report Status PENDING  Incomplete     Radiology Studies: MR BRAIN WO CONTRAST  Result Date: 11/22/2021 CLINICAL DATA:  Mental status change, unknown cause EXAM: MRI HEAD WITHOUT CONTRAST TECHNIQUE: Multiplanar, multiecho pulse sequences of the brain and surrounding structures were  obtained without intravenous contrast. COMPARISON:  None Available. FINDINGS: Brain: There is no acute infarction or intracranial hemorrhage. There is no intracranial mass, mass effect, or edema. There is no hydrocephalus or extra-axial fluid collection. Chronic right parietotemporal infarct with chronic blood products. Additional confluent areas of T2 hyperintensity in the supratentorial greater than pontine white matter are nonspecific but may reflect advanced chronic microvascular ischemic changes. Prominence of the ventricles and sulci reflects parenchymal volume loss. These findings have progressed since 2019. Vascular: Major vessel flow voids at the skull base are preserved. Skull and upper cervical spine: Normal marrow signal is preserved. Sinuses/Orbits: Minor mucosal thickening. Bilateral lens replacements. Other: Sella is unremarkable.  Mastoid air cells are clear. IMPRESSION: No acute infarction, hemorrhage, or mass. Chronic right parietotemporal infarct. Progression of parenchymal volume loss and advanced chronic microvascular ischemic changes since 2019. Electronically Signed    By: Macy Mis M.D.   On: 11/22/2021 11:44     Marzetta Board, MD, PhD [facility]  Between 7 am - 7 pm I am available, please contact me via Amion (for emergencies) or Securechat (non urgent messages)  Between 7 pm - 7 am I am not available, please contact night coverage MD/APP via Amion
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
## 2021-11-24 DIAGNOSIS — N390 Urinary tract infection, site not specified: Secondary | ICD-10-CM | POA: Diagnosis not present

## 2021-11-24 DIAGNOSIS — E440 Moderate protein-calorie malnutrition: Secondary | ICD-10-CM | POA: Insufficient documentation

## 2021-11-24 NOTE — Plan of Care (Signed)

## 2021-11-24 NOTE — Progress Notes (Signed)
PROGRESS NOTE  Tiffany [name] NGE:[id number] DOB: 1927-01-25 DOA: 11/21/2021 PCP: Donald Prose, MD   LOS: 2 days   Brief Narrative / Interim history: 88 year old female with history of dementia, hypothyroidism, hypertension, comes into the hospital for a weeks long worsening of mental status, hallucinations, and increased confusion from baseline.  This is similar to her prior episodes of UTI, she also had foul-smelling urine in the past week.  Subjective / 24h Interval events: Remains alert, pleasantly confused  Assesement and Plan: Principal Problem:   UTI (urinary tract infection) Active Problems:   Acute metabolic encephalopathy   AKI (acute kidney injury) ([place])   CKD (chronic kidney disease) stage 3, GFR 30-59 ml/min (HCC)   HTN (hypertension)   Hypothyroidism   Malnutrition of moderate degree   Principal problem UTI (urinary tract infection) -continue Ceftriaxone. Son tells me that she has been having significantly foul smelling urine, worse in the past week. Urine cultures showing E. coli, continue ceftriaxone until final sensitivities are back  Active problems Acute metabolic encephalopathy, underlying dementia - there is a component of progressive dementia, worsening in the past 2-3 weeks likely in the setting of her underlying UTI. Son tells me that she has declined overall in the 2-3 years, still living by herself but increasingly doing unsafe things in the home, such as starting on the oven to dry out her clothes and trying to leave to "move to [place]".  It does not look like she is candidate for Medicaid or long-term placement given financial issues as described in the social workers note.  Son will take her back home on Monday, he will look into additional help  AKI (acute kidney injury) on chronic kidney disease stage IIIa-creatinine has normalized with fluids.  Stop fluids today, she is eating well   HTN (hypertension) -Home metoprolol.  Hold ARB and diuretic  in the setting of AKI  Hypothyroidism-continue Synthroid.  TSH slightly low at 0.192, decrease Synthroid    Scheduled Meds:  acetaminophen  1,000 mg Oral BID   enoxaparin (LOVENOX) injection  30 mg Subcutaneous Q24H   feeding supplement  237 mL Oral BID BM   levothyroxine  75 mcg Oral Q0600   metoprolol tartrate  50 mg Oral BID   multivitamin with minerals  1 tablet Oral Daily   Continuous Infusions:  cefTRIAXone (ROCEPHIN)  IV 1 g (11/23/21 2057)   PRN Meds:.acetaminophen **OR** acetaminophen, hydrALAZINE, loperamide HCl, ondansetron **OR** ondansetron (ZOFRAN) IV  Diet Orders (From admission, onward)     Start     Ordered   11/23/21 0935  Diet regular Room service appropriate? No; Fluid consistency: Thin  Diet effective now       Question Answer Comment  Room service appropriate? No   Fluid consistency: Thin      11/23/21 0935            DVT prophylaxis: enoxaparin (LOVENOX) injection 30 mg Start: 11/22/21 1000   Lab Results  Component Value Date   PLT 129 (L) 11/23/2021      Code Status: DNR  Family Communication: Son updated over the phone    Persistent encephalopathy, confusion  Level of care: Med-Surg  Consultants:  none  Objective: Vitals:   11/23/21 1235 11/23/21 2052 11/23/21 2145 11/24/21 0700  BP: (!) 113/49  (!) 134/55 (!) 167/67  Pulse: 64 72 66 64  Resp: '18  17 17  '$ Temp: 98.3 F (36.8 C)  98.4 F (36.9 C) 97.8 F (36.6 C)  TempSrc: Oral  Oral   SpO2: 96%  96% 98%  Weight:      Height:        Intake/Output Summary (Last 24 hours) at 11/24/2021 1129 Last data filed at 11/24/2021 1000 Gross per 24 hour  Intake 2307.24 ml  Output 650 ml  Net 1657.24 ml    Wt Readings from Last 3 Encounters:  11/21/21 66.7 kg  10/02/20 67.1 kg  02/02/19 59 kg    Examination:  Constitutional: NAD Eyes: lids and conjunctivae normal, no scleral icterus ENMT: mmm Neck: normal, supple Respiratory: clear to auscultation bilaterally, no  wheezing, no crackles. Normal respiratory effort.  Cardiovascular: Regular rate and rhythm, no murmurs / rubs / gallops. No LE edema. Abdomen: soft, no distention, no tenderness. Bowel sounds positive.  Skin: no rashes Neurologic: no focal deficits, equal strength   Data Reviewed: I have independently reviewed following labs and imaging studies   CBC Recent Labs  Lab 11/21/21 1700 11/22/21 0245 11/23/21 0337  WBC 6.0 4.6 4.5  HGB 11.5* 9.2* 10.0*  HCT 35.8* 29.6* 31.6*  PLT 176 134* 129*  MCV 97.8 99.7 98.8  MCH 31.4 31.0 31.3  MCHC 32.1 31.1 31.6  RDW 13.2 13.2 13.3     Recent Labs  Lab 11/21/21 1700 11/22/21 0245 11/23/21 0337  NA 142 141 143  K 4.0 3.6 3.8  CL 110 111 113*  CO2 '25 26 24  '$ GLUCOSE 102* 104* 97  BUN 31* 23 17  CREATININE 1.78* 1.27* 1.14*  CALCIUM 9.3 8.7* 8.8*  AST 16  --   --   ALT 11  --   --   ALKPHOS 75  --   --   BILITOT 0.4  --   --   ALBUMIN 3.6  --   --   INR 0.9  --   --   TSH 0.192*  --   --   BNP 310.3*  --   --      ------------------------------------------------------------------------------------------------------------------ No results for input(s): "CHOL", "HDL", "LDLCALC", "TRIG", "CHOLHDL", "LDLDIRECT" in the last 72 hours.  Lab Results  Component Value Date   HGBA1C 6.0 (H) 12/23/2017   ------------------------------------------------------------------------------------------------------------------ Recent Labs    11/21/21 1700  TSH 0.192*     Cardiac Enzymes No results for input(s): "CKMB", "TROPONINI", "MYOGLOBIN" in the last 168 hours.  Invalid input(s): "CK" ------------------------------------------------------------------------------------------------------------------    Component Value Date/Time   BNP 310.3 (H) 11/21/2021 1700    CBG: Recent Labs  Lab 11/21/21 1651  GLUCAP 121*     Recent Results (from the past 240 hour(s))  Urine Culture     Status: Abnormal (Preliminary result)    Collection Time: 11/21/21  7:28 PM   Specimen: Urine, Clean Catch  Result Value Ref Range Status   Specimen Description   Final    URINE, CLEAN CATCH Performed at [facility], [place] [address], [place], [place] [address]    Special Requests   Final    NONE Performed at [facility], [place] [address], [place], [place] [address]    Culture (A)  Final    >=100,000 COLONIES/mL ESCHERICHIA COLI SUSCEPTIBILITIES TO FOLLOW Performed at [facility], [place] [address], [place], [place] [address]    Report Status PENDING  Incomplete     Radiology Studies: No results found.   Marzetta Board, MD, PhD [facility]  Between 7 am - 7 pm I am available, please contact me via Amion (for emergencies) or Securechat (non urgent messages)  Between 7 pm - 7 am I am not available, please contact night coverage MD/APP via Amion

## 2021-11-24 NOTE — Plan of Care (Signed)
  Problem: Coping: Goal: Level of anxiety will decrease Outcome: Progressing   Problem: Pain Managment: Goal: General experience of comfort will improve Outcome: Progressing   Problem: Safety: Goal: Ability to remain free from injury will improve Outcome: Progressing   

## 2021-11-24 NOTE — TOC Progression Note (Signed)
Transition of Care [facility]) - Progression Note   Patient Details  Name: Tiffany [name] MRN: [id number] Date of Birth: 25-Dec-1926  Transition of [facility]) CM/SW [place], LCSW Phone Number: 11/24/2021, 12:29 PM  Clinical Narrative: CSW received voicemail from Ms. Tim Lair [phone]) with [place] that she is the Education officer, museum that has been assigned to the patient's case. CSW left VM for Ms. Tim Lair regarding patient's room assignment so that the patient can be assessed. TOC to follow.  Expected Discharge Plan: Home/Self Care Barriers to Discharge: Continued Medical Work up, Family Issues  Expected Discharge Plan and Services Expected Discharge Plan: Home/Self Care In-house Referral: Clinical Social Work Post Acute Care Choice: NA Living arrangements for the past 2 months: [place]            DME Arranged: N/A DME Agency: NA  Readmission Risk Interventions     No data to display

## 2021-11-25 DIAGNOSIS — N390 Urinary tract infection, site not specified: Secondary | ICD-10-CM | POA: Diagnosis not present

## 2021-11-25 LAB — URINE CULTURE: Culture: >=100000 — AB

## 2021-11-25 MED ORDER — AMLODIPINE BESYLATE 5 MG PO TABS
5.0000 mg | ORAL_TABLET | Freq: Every day | ORAL | Status: DC
  Administered 2021-11-25 – 2021-11-26 (×2): 5 mg via ORAL
  Filled 2021-11-25 (×2): qty 1

## 2021-11-25 MED ORDER — AMOXICILLIN-POT CLAVULANATE 875-125 MG PO TABS
1.0000 | ORAL_TABLET | Freq: Two times a day (BID) | ORAL | Status: DC
  Administered 2021-11-25 – 2021-11-26 (×2): 1 via ORAL
  Filled 2021-11-25 (×2): qty 1

## 2021-11-25 NOTE — Plan of Care (Signed)
  Problem: Coping: Goal: Level of anxiety will decrease Outcome: Progressing   Problem: Pain Managment: Goal: General experience of comfort will improve Outcome: Progressing   Problem: Pain Managment: Goal: General experience of comfort will improve Outcome: Progressing   Problem: Elimination: Goal: Will not experience complications related to bowel motility Outcome: Progressing Goal: Will not experience complications related to urinary retention Outcome: Progressing   Problem: Safety: Goal: Ability to remain free from injury will improve Outcome: Progressing

## 2021-11-25 NOTE — Progress Notes (Signed)
PROGRESS NOTE  Tiffany [name] FXT:[id number] DOB: May 12, 1926 DOA: 11/21/2021 PCP: Donald Prose, MD   LOS: 3 days   Brief Narrative / Interim history: 88 year old female with history of dementia, hypothyroidism, hypertension, comes into the hospital for a weeks long worsening of mental status, hallucinations, and increased confusion from baseline.  This is similar to her prior episodes of UTI, she also had foul-smelling urine in the past week.  Subjective / 24h Interval events: Remains alert, pleasantly confused  Assesement and Plan: Principal Problem:   UTI (urinary tract infection) Active Problems:   Acute metabolic encephalopathy   AKI (acute kidney injury) ([place])   CKD (chronic kidney disease) stage 3, GFR 30-59 ml/min (HCC)   HTN (hypertension)   Hypothyroidism   Malnutrition of moderate degree   Principal problem UTI (urinary tract infection) -initially admitted to the hospital and placed on ceftriaxone.  Urine culture shows E. coli which is pansensitive.  Narrowed to Augmentin  Active problems Acute metabolic encephalopathy, underlying dementia - there is a component of progressive dementia, worsening in the past 2-3 weeks likely in the setting of her underlying UTI. Son tells me that she has declined overall in the 2-3 years, still living by herself but increasingly doing unsafe things in the home, such as starting on the oven to dry out her clothes and trying to leave to "move to [place]".  It does not look like she is candidate for Medicaid or long-term placement given financial issues as described in the social workers note.  Plan discharge back home tomorrow  AKI (acute kidney injury) on chronic kidney disease stage IIIa-creatinine has returned to her baseline after fluids   HTN (hypertension) -continue home metoprolol  Hypothyroidism-continue Synthroid.  TSH slightly low at 0.192, decrease Synthroid    Scheduled Meds:  acetaminophen  1,000 mg Oral BID    amLODipine  5 mg Oral Daily   enoxaparin (LOVENOX) injection  30 mg Subcutaneous Q24H   feeding supplement  237 mL Oral BID BM   levothyroxine  75 mcg Oral Q0600   metoprolol tartrate  50 mg Oral BID   multivitamin with minerals  1 tablet Oral Daily   Continuous Infusions:  cefTRIAXone (ROCEPHIN)  IV 1 g (11/24/21 1959)   PRN Meds:.acetaminophen **OR** acetaminophen, hydrALAZINE, loperamide HCl, ondansetron **OR** ondansetron (ZOFRAN) IV  Diet Orders (From admission, onward)     Start     Ordered   11/23/21 0935  Diet regular Room service appropriate? No; Fluid consistency: Thin  Diet effective now       Question Answer Comment  Room service appropriate? No   Fluid consistency: Thin      11/23/21 0935            DVT prophylaxis: enoxaparin (LOVENOX) injection 30 mg Start: 11/22/21 1000   Lab Results  Component Value Date   PLT 129 (L) 11/23/2021      Code Status: DNR  Family Communication: Son updated over the phone    Persistent encephalopathy, confusion  Level of care: Med-Surg  Consultants:  none  Objective: Vitals:   11/24/21 1300 11/24/21 2001 11/24/21 2123 11/25/21 0633  BP: (!) 162/52  (!) 158/57 (!) 182/71  Pulse: 62 76 71 63  Resp: '16  16 17  '$ Temp: 98.2 F (36.8 C)  98.2 F (36.8 C) 97.6 F (36.4 C)  TempSrc: Oral  Oral Oral  SpO2: 96%  96% 98%  Weight:      Height:        Intake/Output  Summary (Last 24 hours) at 11/25/2021 1126 Last data filed at 11/25/2021 1000 Gross per 24 hour  Intake 940 ml  Output 1050 ml  Net -110 ml    Wt Readings from Last 3 Encounters:  11/21/21 66.7 kg  10/02/20 67.1 kg  02/02/19 59 kg    Examination:  Constitutional: NAD Eyes: lids and conjunctivae normal, no scleral icterus ENMT: mmm Neck: normal, supple Respiratory: clear to auscultation bilaterally, no wheezing, no crackles. Normal respiratory effort.  Cardiovascular: Regular rate and rhythm, no murmurs / rubs / gallops. No LE edema. Abdomen:  soft, no distention, no tenderness. Bowel sounds positive.  Skin: no rashes Neurologic: no focal deficits, equal strength   Data Reviewed: I have independently reviewed following labs and imaging studies   CBC Recent Labs  Lab 11/21/21 1700 11/22/21 0245 11/23/21 0337  WBC 6.0 4.6 4.5  HGB 11.5* 9.2* 10.0*  HCT 35.8* 29.6* 31.6*  PLT 176 134* 129*  MCV 97.8 99.7 98.8  MCH 31.4 31.0 31.3  MCHC 32.1 31.1 31.6  RDW 13.2 13.2 13.3     Recent Labs  Lab 11/21/21 1700 11/22/21 0245 11/23/21 0337  NA 142 141 143  K 4.0 3.6 3.8  CL 110 111 113*  CO2 '25 26 24  '$ GLUCOSE 102* 104* 97  BUN 31* 23 17  CREATININE 1.78* 1.27* 1.14*  CALCIUM 9.3 8.7* 8.8*  AST 16  --   --   ALT 11  --   --   ALKPHOS 75  --   --   BILITOT 0.4  --   --   ALBUMIN 3.6  --   --   INR 0.9  --   --   TSH 0.192*  --   --   BNP 310.3*  --   --      ------------------------------------------------------------------------------------------------------------------ No results for input(s): "CHOL", "HDL", "LDLCALC", "TRIG", "CHOLHDL", "LDLDIRECT" in the last 72 hours.  Lab Results  Component Value Date   HGBA1C 6.0 (H) 12/23/2017   ------------------------------------------------------------------------------------------------------------------ No results for input(s): "TSH", "T4TOTAL", "T3FREE", "THYROIDAB" in the last 72 hours.  Invalid input(s): "FREET3"   Cardiac Enzymes No results for input(s): "CKMB", "TROPONINI", "MYOGLOBIN" in the last 168 hours.  Invalid input(s): "CK" ------------------------------------------------------------------------------------------------------------------    Component Value Date/Time   BNP 310.3 (H) 11/21/2021 1700    CBG: Recent Labs  Lab 11/21/21 1651  GLUCAP 121*     Recent Results (from the past 240 hour(s))  Urine Culture     Status: Abnormal   Collection Time: 11/21/21  7:28 PM   Specimen: Urine, Clean Catch  Result Value Ref Range Status    Specimen Description   Final    URINE, CLEAN CATCH Performed at [facility], [place] [address], [place], [place] [address]    Special Requests   Final    NONE Performed at [facility], [place] [address], [place], [place] [address]    Culture >=100,000 COLONIES/mL ESCHERICHIA COLI (A)  Final   Report Status 11/25/2021 FINAL  Final   Organism ID, Bacteria ESCHERICHIA COLI (A)  Final      Susceptibility   Escherichia coli - MIC*    AMPICILLIN <=2 SENSITIVE Sensitive     CEFAZOLIN <=4 SENSITIVE Sensitive     CEFEPIME <=0.12 SENSITIVE Sensitive     CEFTRIAXONE <=0.25 SENSITIVE Sensitive     CIPROFLOXACIN <=0.25 SENSITIVE Sensitive     GENTAMICIN <=1 SENSITIVE Sensitive     IMIPENEM <=0.25 SENSITIVE Sensitive  NITROFURANTOIN <=16 SENSITIVE Sensitive     TRIMETH/SULFA <=20 SENSITIVE Sensitive     AMPICILLIN/SULBACTAM <=2 SENSITIVE Sensitive     PIP/TAZO <=4 SENSITIVE Sensitive     * >=100,000 COLONIES/mL ESCHERICHIA COLI     Radiology Studies: No results found.   Marzetta Board, MD, PhD [facility]  Between 7 am - 7 pm I am available, please contact me via Amion (for emergencies) or Securechat (non urgent messages)  Between 7 pm - 7 am I am not available, please contact night coverage MD/APP via Amion

## 2021-11-25 NOTE — Plan of Care (Signed)
  Problem: Education: Goal: Knowledge of General Education information will improve Description Including pain rating scale, medication(s)/side effects and non-pharmacologic comfort measures Outcome: Progressing   Problem: Activity: Goal: Risk for activity intolerance will decrease Outcome: Progressing   Problem: Safety: Goal: Ability to remain free from injury will improve Outcome: Progressing   

## 2021-11-26 DIAGNOSIS — N390 Urinary tract infection, site not specified: Secondary | ICD-10-CM | POA: Diagnosis not present

## 2021-11-26 DIAGNOSIS — Z743 Need for continuous supervision: Secondary | ICD-10-CM | POA: Diagnosis not present

## 2021-11-26 DIAGNOSIS — Z7401 Bed confinement status: Secondary | ICD-10-CM | POA: Diagnosis not present

## 2021-11-26 DIAGNOSIS — R531 Weakness: Secondary | ICD-10-CM | POA: Diagnosis not present

## 2021-11-26 MED ORDER — AMOXICILLIN-POT CLAVULANATE 875-125 MG PO TABS: 1.0000 | ORAL_TABLET | Freq: Two times a day (BID) | ORAL | 0 refills | Status: AC

## 2021-11-26 MED ORDER — LEVOTHYROXINE SODIUM 75 MCG PO TABS: 75.0000 ug | ORAL_TABLET | Freq: Every day | ORAL | 0 refills | Status: AC

## 2021-11-26 NOTE — Plan of Care (Signed)
  Problem: Elimination: Goal: Will not experience complications related to bowel motility 11/26/2021 0134 by Blase Mess, RN Outcome: Progressing 11/26/2021 0127 by Blase Mess, RN Outcome: Progressing Goal: Will not experience complications related to urinary retention 11/26/2021 0134 by Blase Mess, RN Outcome: Progressing 11/26/2021 0127 by Blase Mess, RN Outcome: Progressing   Problem: Safety: Goal: Ability to remain free from injury will improve 11/26/2021 0134 by Blase Mess, RN Outcome: Progressing 11/26/2021 0127 by Blase Mess, RN Outcome: Progressing   Problem: Skin Integrity: Goal: Risk for impaired skin integrity will decrease 11/26/2021 0134 by Blase Mess, RN Outcome: Progressing 11/26/2021 0127 by Blase Mess, RN Outcome: Progressing

## 2021-11-26 NOTE — Discharge Summary (Signed)
Physician Discharge Summary  Tiffany [name] EQA:[id number] DOB: 09/17/1926 DOA: 11/21/2021  PCP: Donald Prose, MD  Admit date: 11/21/2021 Discharge date: 11/26/2021  Admitted From: home Disposition:  home  Recommendations for Outpatient Follow-up:  Follow up with PCP in 1-2 weeks  Home Health: PT Equipment/Devices: none  Discharge Condition: stable CODE STATUS: DNR Diet Orders (From admission, onward)     Start     Ordered   11/23/21 0935  Diet regular Room service appropriate? No; Fluid consistency: Thin  Diet effective now       Question Answer Comment  Room service appropriate? No   Fluid consistency: Thin      11/23/21 0935            HPI: Per admitting MD, Tiffany [name] is a 88 y.o. female with medical history significant of dementia, hypothyroidism, HTN. At baseline she has fairly significant dementia, probably should be in at least an ALF if not higher level of care though Son admits hasnt been able to move her due to financial situation. This past week however, she has acute worsening of mental status with hallucinations and increased confusion from baseline.  Has had this in past with UTI.  Also has foul smelling urine this past week. Pt oriented to person and place, not time. H/o admit for UTI + AKI + delirium in May 2022.  Mostly sensitive E.Coli (resistant to cipro only) + K.Pneumo (resistant to ampicillin only) in urine that admit.  Hospital Course / Discharge diagnoses: Principal Problem:   UTI (urinary tract infection) Active Problems:   Acute metabolic encephalopathy   AKI (acute kidney injury) ([place])   CKD (chronic kidney disease) stage 3, GFR 30-59 ml/min (HCC)   HTN (hypertension)   Hypothyroidism   Malnutrition of moderate degree   Principal problem UTI (urinary tract infection) -initially admitted to the hospital and placed on ceftriaxone.  Urine culture shows E. coli which is pansensitive.  Narrowed to Augmentin, will need 3 additional days  upon discharge   Active problems Acute metabolic encephalopathy, underlying dementia - there is a component of progressive dementia, worsening in the past 2-3 weeks likely in the setting of her underlying UTI. Son tells me that she has declined overall in the 2-3 years, suspect progressive dementia.  MRI of the brain without acute findings  AKI (acute kidney injury) on chronic kidney disease stage IIIa-creatinine has returned to her baseline after fluids HTN (hypertension) -resume home medications Hypothyroidism-continue Synthroid.  TSH slightly low at 0.192, decrease Synthroid.  Please recheck a TSH in 4 weeks  Sepsis ruled out   Discharge Instructions   Allergies as of 11/26/2021       Reactions   Codeine Nausea Only   Morphine And Related Anxiety, Other (See Comments)   Feel very unwell and out there   Sulfa Antibiotics Hives, Itching        Medication List     TAKE these medications    acetaminophen 500 MG tablet Commonly known as: TYLENOL Take 1,000 mg by mouth in the morning and at bedtime.   amoxicillin-clavulanate 875-125 MG tablet Commonly known as: AUGMENTIN Take 1 tablet by mouth every 12 (twelve) hours for 3 days.   furosemide 40 MG tablet Commonly known as: LASIX Take 1 tablet (40 mg total) by mouth daily. What changed: when to take this   irbesartan 300 MG tablet Commonly known as: AVAPRO Take 300 mg by mouth every morning.   levothyroxine 75 MCG tablet Commonly known as: SYNTHROID Take  1 tablet (75 mcg total) by mouth daily at 6 (six) AM. Start taking on: November 27, 2021 What changed:  medication strength how much to take when to take this   loperamide 2 MG tablet Commonly known as: IMODIUM A-D Take 1 mg by mouth daily as needed for diarrhea or loose stools.   metoprolol tartrate 50 MG tablet Commonly known as: LOPRESSOR Take 50 mg by mouth 2 (two) times daily.       Consultations: none  Procedures/Studies:  MR BRAIN WO  CONTRAST  Result Date: 11/22/2021 CLINICAL DATA:  Mental status change, unknown cause EXAM: MRI HEAD WITHOUT CONTRAST TECHNIQUE: Multiplanar, multiecho pulse sequences of the brain and surrounding structures were obtained without intravenous contrast. COMPARISON:  None Available. FINDINGS: Brain: There is no acute infarction or intracranial hemorrhage. There is no intracranial mass, mass effect, or edema. There is no hydrocephalus or extra-axial fluid collection. Chronic right parietotemporal infarct with chronic blood products. Additional confluent areas of T2 hyperintensity in the supratentorial greater than pontine white matter are nonspecific but may reflect advanced chronic microvascular ischemic changes. Prominence of the ventricles and sulci reflects parenchymal volume loss. These findings have progressed since 2019. Vascular: Major vessel flow voids at the skull base are preserved. Skull and upper cervical spine: Normal marrow signal is preserved. Sinuses/Orbits: Minor mucosal thickening. Bilateral lens replacements. Other: Sella is unremarkable.  Mastoid air cells are clear. IMPRESSION: No acute infarction, hemorrhage, or mass. Chronic right parietotemporal infarct. Progression of parenchymal volume loss and advanced chronic microvascular ischemic changes since 2019. Electronically Signed   By: Macy Mis M.D.   On: 11/22/2021 11:44   CT HEAD WO CONTRAST (5MM)  Result Date: 11/21/2021 CLINICAL DATA:  Hallucinations and confusion EXAM: CT HEAD WITHOUT CONTRAST TECHNIQUE: Contiguous axial images were obtained from the base of the skull through the vertex without intravenous contrast. RADIATION DOSE REDUCTION: This exam was performed according to the departmental dose-optimization program which includes automated exposure control, adjustment of the mA and/or kV according to patient size and/or use of iterative reconstruction technique. COMPARISON:  10/01/2020 FINDINGS: Brain: No evidence of acute  infarction, hemorrhage, cerebral edema, mass, mass effect, or midline shift. No hydrocephalus or extra-axial fluid collection. Redemonstrated encephalomalacia in the right temporoparietal region. Periventricular white matter changes, likely the sequela of chronic small vessel ischemic disease. Vascular: No hyperdense vessel. Atherosclerotic calcifications in the intracranial carotid and vertebral arteries. Skull: Hyperostosis frontalis.  No acute fracture or focal lesion. Sinuses/Orbits: Air-fluid levels in the sphenoid sinuses with bubbly fluid in the left sphenoid sinus. Status post bilateral lens replacements. Other: The mastoid air cells are well aerated. IMPRESSION: 1.  No acute intracranial process. 2. Air-fluid level with bubbly fluid in the left sphenoid sinus, as can be seen with acute sinusitis. Correlate with symptoms. Electronically Signed   By: Merilyn Baba M.D.   On: 11/21/2021 19:00   DG Chest 2 View  Result Date: 11/21/2021 CLINICAL DATA:  Hallucinations and confusion. EXAM: CHEST - 2 VIEW COMPARISON:  Oct 01, 2020 FINDINGS: Tortuosity and calcific atherosclerotic disease of the aorta. Cardiomediastinal silhouette is normal. Mediastinal contours appear intact. Chronic elevation of the left hemidiaphragm. No focal airspace consolidation. Osseous structures are without acute abnormality. Soft tissues are grossly normal. IMPRESSION: No active cardiopulmonary disease. Electronically Signed   By: Fidela Salisbury M.D.   On: 11/21/2021 17:22     Subjective: - no chest pain, shortness of breath, no abdominal pain, nausea or vomiting.   Discharge Exam: BP (!) 143/82 (  BP Location: Right Arm)   Pulse 66   Temp 98 F (36.7 C) (Oral)   Resp 16   Ht 5' 6.5" (1.689 m)   Wt 66.7 kg   SpO2 96%   BMI 23.37 kg/m   General: Pt is alert, awake, not in acute distress Cardiovascular: RRR, S1/S2 +, no rubs, no gallops Respiratory: CTA bilaterally, no wheezing, no rhonchi Abdominal: Soft, NT,  ND, bowel sounds + Extremities: no edema, no cyanosis   The results of significant diagnostics from this hospitalization (including imaging, microbiology, ancillary and laboratory) are listed below for reference.     Microbiology: Recent Results (from the past 240 hour(s))  Urine Culture     Status: Abnormal   Collection Time: 11/21/21  7:28 PM   Specimen: Urine, Clean Catch  Result Value Ref Range Status   Specimen Description   Final    URINE, CLEAN CATCH Performed at [facility], [place] [address] [place], [place] [address]    Special Requests   Final    NONE Performed at [facility], [place] [address] [place], [place] [address]    Culture >=100,000 COLONIES/mL ESCHERICHIA COLI (A)  Final   Report Status 11/25/2021 FINAL  Final   Organism ID, Bacteria ESCHERICHIA COLI (A)  Final      Susceptibility   Escherichia coli - MIC*    AMPICILLIN <=2 SENSITIVE Sensitive     CEFAZOLIN <=4 SENSITIVE Sensitive     CEFEPIME <=0.12 SENSITIVE Sensitive     CEFTRIAXONE <=0.25 SENSITIVE Sensitive     CIPROFLOXACIN <=0.25 SENSITIVE Sensitive     GENTAMICIN <=1 SENSITIVE Sensitive     IMIPENEM <=0.25 SENSITIVE Sensitive     NITROFURANTOIN <=16 SENSITIVE Sensitive     TRIMETH/SULFA <=20 SENSITIVE Sensitive     AMPICILLIN/SULBACTAM <=2 SENSITIVE Sensitive     PIP/TAZO <=4 SENSITIVE Sensitive     * >=100,000 COLONIES/mL ESCHERICHIA COLI     Labs: Basic Metabolic Panel: Recent Labs  Lab 11/21/21 1700 11/22/21 0245 11/23/21 0337  NA 142 141 143  K 4.0 3.6 3.8  CL 110 111 113*  CO2 '25 26 24  '$ GLUCOSE 102* 104* 97  BUN 31* 23 17  CREATININE 1.78* 1.27* 1.14*  CALCIUM 9.3 8.7* 8.8*   Liver Function Tests: Recent Labs  Lab 11/21/21 1700  AST 16  ALT 11  ALKPHOS 75  BILITOT 0.4  PROT 6.4*  ALBUMIN 3.6   CBC: Recent Labs  Lab 11/21/21 1700 11/22/21 0245 11/23/21 0337  WBC 6.0 4.6 4.5  HGB 11.5* 9.2* 10.0*  HCT 35.8* 29.6* 31.6*  MCV  97.8 99.7 98.8  PLT 176 134* 129*   CBG: Recent Labs  Lab 11/21/21 1651  GLUCAP 121*   Hgb A1c No results for input(s): "HGBA1C" in the last 72 hours. Lipid Profile No results for input(s): "CHOL", "HDL", "LDLCALC", "TRIG", "CHOLHDL", "LDLDIRECT" in the last 72 hours. Thyroid function studies No results for input(s): "TSH", "T4TOTAL", "T3FREE", "THYROIDAB" in the last 72 hours.  Invalid input(s): "FREET3" Urinalysis    Component Value Date/Time   COLORURINE YELLOW 11/21/2021 1928   APPEARANCEUR HAZY (A) 11/21/2021 1928   LABSPEC 1.018 11/21/2021 1928   PHURINE 5.0 11/21/2021 1928   GLUCOSEU NEGATIVE 11/21/2021 1928   HGBUR NEGATIVE 11/21/2021 1928   BILIRUBINUR NEGATIVE 11/21/2021 1928   KETONESUR NEGATIVE 11/21/2021 1928   PROTEINUR NEGATIVE 11/21/2021 1928   UROBILINOGEN 0.2 02/09/2015 0158   NITRITE NEGATIVE 11/21/2021 1928   LEUKOCYTESUR LARGE (A) 11/21/2021 1928  FURTHER DISCHARGE INSTRUCTIONS:   Get Medicines reviewed and adjusted: Please take all your medications with you for your next visit with your Primary MD   Laboratory/radiological data: Please request your Primary MD to go over all hospital tests and procedure/radiological results at the follow up, please ask your Primary MD to get all Hospital records sent to his/her office.   In some cases, they will be blood work, cultures and biopsy results pending at the time of your discharge. Please request that your primary care M.D. goes through all the records of your hospital data and follows up on these results.   Also Note the following: If you experience worsening of your admission symptoms, develop shortness of breath, life threatening emergency, suicidal or homicidal thoughts you must seek medical attention immediately by calling 911 or calling your MD immediately  if symptoms less severe.   You must read complete instructions/literature along with all the possible adverse reactions/side effects for all  the Medicines you take and that have been prescribed to you. Take any new Medicines after you have completely understood and accpet all the possible adverse reactions/side effects.    Do not drive when taking Pain medications or sleeping medications (Benzodaizepines)   Do not take more than prescribed Pain, Sleep and Anxiety Medications. It is not advisable to combine anxiety,sleep and pain medications without talking with your primary care practitioner   Special Instructions: If you have smoked or chewed Tobacco  in the last 2 yrs please stop smoking, stop any regular Alcohol  and or any Recreational drug use.   Wear Seat belts while driving.   Please note: You were cared for by a hospitalist during your hospital stay. Once you are discharged, your primary care physician will handle any further medical issues. Please note that NO REFILLS for any discharge medications will be authorized once you are discharged, as it is imperative that you return to your primary care physician (or establish a relationship with a primary care physician if you do not have one) for your post hospital discharge needs so that they can reassess your need for medications and monitor your lab values.  Time coordinating discharge: 35 minutes  SIGNED:  Marzetta Board, MD, PhD 11/26/2021, 7:34 AM

## 2021-11-26 NOTE — Plan of Care (Signed)
  Problem: Coping: Goal: Level of anxiety will decrease Outcome: Progressing   Problem: Pain Managment: Goal: General experience of comfort will improve Outcome: Progressing   Problem: Safety: Goal: Ability to remain free from injury will improve Outcome: Progressing   

## 2021-11-26 NOTE — TOC Transition Note (Signed)
Transition of Care [facility]) - CM/SW Discharge Note  Patient Details  Name: Tiffany [name] MRN: [id number] Date of Birth: 02/02/1927  [facility] CM/SW Contact:  Sherie Don, LCSW Phone Number: 11/26/2021, 12:59 PM  Clinical Narrative: Patient medically ready for discharge back home to her ILF. Son agreeable to waiting at the apartment to let in the patient when PTAR arrives. Medical necessity form done; PTAR scheduled. RN updated. TOC signing off.  Final next level of care: Home/Self Care Barriers to Discharge: Barriers Resolved  Patient Goals and CMS Choice Patient states their goals for this hospitalization and ongoing recovery are:: Find placement for patient Choice offered to / list presented to : NA  Discharge Plan and Services In-house Referral: Clinical Social Work Post Acute Care Choice: NA          DME Arranged: N/A DME Agency: NA  Readmission Risk Interventions     No data to display

## 2021-11-30 ENCOUNTER — Encounter: Payer: Self-pay | Admitting: Family Medicine

## 2021-11-30 ENCOUNTER — Other Ambulatory Visit: Payer: Medicare HMO | Admitting: Family Medicine

## 2021-11-30 VITALS — BP 156/80 | HR 70

## 2021-11-30 DIAGNOSIS — G301 Alzheimer's disease with late onset: Secondary | ICD-10-CM | POA: Diagnosis not present

## 2021-11-30 DIAGNOSIS — F0280 Dementia in other diseases classified elsewhere without behavioral disturbance: Secondary | ICD-10-CM | POA: Diagnosis not present

## 2021-11-30 DIAGNOSIS — E039 Hypothyroidism, unspecified: Secondary | ICD-10-CM | POA: Diagnosis not present

## 2021-11-30 DIAGNOSIS — R69 Illness, unspecified: Secondary | ICD-10-CM | POA: Diagnosis not present

## 2021-11-30 NOTE — Progress Notes (Signed)
[facility] Palliative Care Consult Note Telephone: [phone]  Fax: [phone]    Date of encounter: 11/30/21 9:46 AM PATIENT NAME: Tiffany [name] [address] [place] [address]   [phone] (home)  DOB: 04-30-1927 MRN: [id number] PRIMARY CARE PROVIDER:    Donald Prose, MD,  [address] [place] [address] [phone]  REFERRING PROVIDER:   Donald Prose, MD [place],  [place] [address] [phone]  RESPONSIBLE PARTY:    Contact Information     Name Relation Home Work Mobile   Tiffany [name] Son [phone]  [phone]   Tiffany [name] Relative   [phone]        I met face to face with patient in her independent living facility. Palliative Care was asked to follow this patient by consultation request of  Donald Prose, MD to address and complex medical decision making. This is a follow up visit.                                   ASSESSMENT, SYMPTOM AND PLAN / RECOMMENDATIONS:   Alzheimer's Dementia  FAST 7 score 6e Not currently on medication. Monitor for s/sx of dysphagia   Hypothyroidism Low TSH, Synthroid dose decreased to 75 mcg daily Recommend repeat TSH in 3 weeks  3.  AKI Resolving Avoid nephrotoxic substances, encourage fluid intake Recommend repeat BMP in 3 weeks with TSH  Advance Care Planning/Goals of Care: Goals include to maximize quality of life and symptom management.  Exploration of goals of care in the event of a sudden injury or illness  Identification of a healthcare agent-son Tiffany [name] POA Review of an advance directive document-Has MOST. Decision not to resuscitate or to de-escalate disease focused treatments due to poor prognosis. CODE STATUS: MOST as of 06/01/20: DNR/DNI, comfort measures No antibiotics/IV fluids/no feeding tube    Follow up Palliative Care Visit: Palliative care will continue to follow for complex medical decision  making, advance care planning, and clarification of goals. Return 4 weeks or prn.   This visit was coded based on medical decision making (MDM).  PPS: 40%  HOSPICE ELIGIBILITY/DIAGNOSIS: TBD  Chief Complaint:  Palliative Care is following up with patient who has dementia following recent hospitalization for AMS and UTI.  HISTORY OF PRESENT ILLNESS:  Tiffany [name] is a 88 y.o. year old female with dementia, HTN, hx of embolic CVA, hypothyroidism, CKD stage 3, frequent falls, and malnutrition of moderate degree. During hospitalization, TSH was found to be low and Synthroid decreased to 75 mcg daily.  Recent hospitalization for AMS with pansensitive E coli uti treated.  Incontinent of bowel and bladder.  At d/c [facility] PT was recommended. She has dementia but in week prior to admission had worsening hallucinations and confusion with foul smelling urine. She was treated with Ceftriaxone, narrowed to Augmentin. Pt asked multiple times during assessment if I had seen her father who was coming to pick her up.  When asked she acknowledged that she is 88 and has a birthday soon but then states she was going to go home and her dad was coming. Suspect pt is incontinent as she has many packs of briefs in her room, has waterproof chux in the chair and on her bed. She states her son gives her her medicine.  History obtained from review of EMR, discussion with Tiffany [name].   11/23/21: CR 1.14, BUN  17, Ca 8.8 CBC remarkable for HGB 10.0, HCT 31.6% and PLT 129  MRI head wo contrast 11/22/21: IMPRESSION: No acute infarction, hemorrhage, or mass. Chronic right parietotemporal infarct. Progression of parenchymal volume loss and advanced chronic microvascular ischemic changes since 2019.   CT head wo contrast 11/21/21:  IMPRESSION: 1.  No acute intracranial process. 2. Air-fluid level with bubbly fluid in the left sphenoid sinus, as can be seen with acute sinusitis. Correlate with symptoms.  CXR  11/21/21: IMPRESSION: No active cardiopulmonary disease.   11/21/21: BMP with elevated Cr 1.78, BUN 31 CBC remarkable for HGB 11.5 and HCT 35.8%  I reviewed available labs, medications, imaging, studies and related documents from the EMR.  Records reviewed and summarized above.   ROS Not a completely reliable historian General: NAD ENMT: denies dysphagia Cardiovascular: denies chest pain, denies DOE Pulmonary: denies cough, denies increased SOB Abdomen: endorses good appetite, denies constipation, GU: denies dysuria MSK:  denies increased weakness, no falls reported Skin: denies rashes or wounds Neurological: denies pain, denies insomnia Psych: Endorses positive mood   Physical Exam: Current and past weights: 146.74 lbs on 11/23/21 Constitutional: NAD General: frail appearing ENMT: hoh, oral mucous membranes moist, dentition intact CV: S1S2, RRR with lusb murmur, no LE edema Pulmonary: CTA in BUL, diminished in bll, no increased work of breathing, no cough, room air Abdomen: normo-active BS + 4 quadrants, soft and non tender MSK: no sarcopenia, moves all extremities, ambulatory with rw Skin: warm and dry, no rashes or wounds on visible skin Neuro:  no generalized weakness,  no cognitive impairment Psych: non-anxious affect, A and O x 1 Hem/lymph/immuno: no widespread bruising   Thank you for the opportunity to participate in the care of Tiffany [name].  The palliative care team will continue to follow. Please call our office at [phone] if we can be of additional assistance.   Marijo Conception, FNP - C  COVID-19 PATIENT SCREENING TOOL Asked and negative response unless otherwise noted:   Have you had symptoms of covid, tested positive or been in contact with someone with symptoms/positive test in the past 5-10 days?  no

## 2021-12-05 DIAGNOSIS — N390 Urinary tract infection, site not specified: Secondary | ICD-10-CM | POA: Diagnosis not present

## 2021-12-05 DIAGNOSIS — E039 Hypothyroidism, unspecified: Secondary | ICD-10-CM | POA: Diagnosis not present

## 2021-12-05 DIAGNOSIS — G309 Alzheimer's disease, unspecified: Secondary | ICD-10-CM | POA: Diagnosis not present

## 2021-12-05 DIAGNOSIS — I129 Hypertensive chronic kidney disease with stage 1 through stage 4 chronic kidney disease, or unspecified chronic kidney disease: Secondary | ICD-10-CM | POA: Diagnosis not present

## 2021-12-05 DIAGNOSIS — N1832 Chronic kidney disease, stage 3b: Secondary | ICD-10-CM | POA: Diagnosis not present

## 2021-12-11 DIAGNOSIS — M1990 Unspecified osteoarthritis, unspecified site: Secondary | ICD-10-CM | POA: Diagnosis not present

## 2021-12-11 DIAGNOSIS — R2231 Localized swelling, mass and lump, right upper limb: Secondary | ICD-10-CM | POA: Diagnosis not present

## 2021-12-11 DIAGNOSIS — R69 Illness, unspecified: Secondary | ICD-10-CM | POA: Diagnosis not present

## 2021-12-11 DIAGNOSIS — I129 Hypertensive chronic kidney disease with stage 1 through stage 4 chronic kidney disease, or unspecified chronic kidney disease: Secondary | ICD-10-CM | POA: Diagnosis not present

## 2021-12-11 DIAGNOSIS — K589 Irritable bowel syndrome without diarrhea: Secondary | ICD-10-CM | POA: Diagnosis not present

## 2021-12-11 DIAGNOSIS — I10 Essential (primary) hypertension: Secondary | ICD-10-CM | POA: Diagnosis not present

## 2021-12-11 DIAGNOSIS — M810 Age-related osteoporosis without current pathological fracture: Secondary | ICD-10-CM | POA: Diagnosis not present

## 2021-12-11 DIAGNOSIS — J309 Allergic rhinitis, unspecified: Secondary | ICD-10-CM | POA: Diagnosis not present

## 2021-12-11 DIAGNOSIS — M5137 Other intervertebral disc degeneration, lumbosacral region: Secondary | ICD-10-CM | POA: Diagnosis not present

## 2021-12-11 DIAGNOSIS — I700 Atherosclerosis of aorta: Secondary | ICD-10-CM | POA: Diagnosis not present

## 2021-12-11 DIAGNOSIS — N183 Chronic kidney disease, stage 3 unspecified: Secondary | ICD-10-CM | POA: Diagnosis not present

## 2021-12-11 DIAGNOSIS — Z9183 Wandering in diseases classified elsewhere: Secondary | ICD-10-CM | POA: Diagnosis not present

## 2021-12-11 DIAGNOSIS — N390 Urinary tract infection, site not specified: Secondary | ICD-10-CM | POA: Diagnosis not present

## 2021-12-11 DIAGNOSIS — D519 Vitamin B12 deficiency anemia, unspecified: Secondary | ICD-10-CM | POA: Diagnosis not present

## 2021-12-11 DIAGNOSIS — G309 Alzheimer's disease, unspecified: Secondary | ICD-10-CM | POA: Diagnosis not present

## 2021-12-11 DIAGNOSIS — Z86718 Personal history of other venous thrombosis and embolism: Secondary | ICD-10-CM | POA: Diagnosis not present

## 2021-12-12 DIAGNOSIS — R8290 Unspecified abnormal findings in urine: Secondary | ICD-10-CM | POA: Diagnosis not present

## 2021-12-17 DIAGNOSIS — Z86718 Personal history of other venous thrombosis and embolism: Secondary | ICD-10-CM | POA: Diagnosis not present

## 2021-12-17 DIAGNOSIS — M5137 Other intervertebral disc degeneration, lumbosacral region: Secondary | ICD-10-CM | POA: Diagnosis not present

## 2021-12-17 DIAGNOSIS — M1990 Unspecified osteoarthritis, unspecified site: Secondary | ICD-10-CM | POA: Diagnosis not present

## 2021-12-17 DIAGNOSIS — I700 Atherosclerosis of aorta: Secondary | ICD-10-CM | POA: Diagnosis not present

## 2021-12-17 DIAGNOSIS — K589 Irritable bowel syndrome without diarrhea: Secondary | ICD-10-CM | POA: Diagnosis not present

## 2021-12-17 DIAGNOSIS — N183 Chronic kidney disease, stage 3 unspecified: Secondary | ICD-10-CM | POA: Diagnosis not present

## 2021-12-17 DIAGNOSIS — Z9183 Wandering in diseases classified elsewhere: Secondary | ICD-10-CM | POA: Diagnosis not present

## 2021-12-17 DIAGNOSIS — M810 Age-related osteoporosis without current pathological fracture: Secondary | ICD-10-CM | POA: Diagnosis not present

## 2021-12-17 DIAGNOSIS — G309 Alzheimer's disease, unspecified: Secondary | ICD-10-CM | POA: Diagnosis not present

## 2021-12-17 DIAGNOSIS — R69 Illness, unspecified: Secondary | ICD-10-CM | POA: Diagnosis not present

## 2021-12-17 DIAGNOSIS — J309 Allergic rhinitis, unspecified: Secondary | ICD-10-CM | POA: Diagnosis not present

## 2021-12-17 DIAGNOSIS — R2231 Localized swelling, mass and lump, right upper limb: Secondary | ICD-10-CM | POA: Diagnosis not present

## 2021-12-17 DIAGNOSIS — D519 Vitamin B12 deficiency anemia, unspecified: Secondary | ICD-10-CM | POA: Diagnosis not present

## 2021-12-17 DIAGNOSIS — N390 Urinary tract infection, site not specified: Secondary | ICD-10-CM | POA: Diagnosis not present

## 2021-12-17 DIAGNOSIS — I129 Hypertensive chronic kidney disease with stage 1 through stage 4 chronic kidney disease, or unspecified chronic kidney disease: Secondary | ICD-10-CM | POA: Diagnosis not present

## 2021-12-19 DIAGNOSIS — Z86718 Personal history of other venous thrombosis and embolism: Secondary | ICD-10-CM | POA: Diagnosis not present

## 2021-12-19 DIAGNOSIS — G309 Alzheimer's disease, unspecified: Secondary | ICD-10-CM | POA: Diagnosis not present

## 2021-12-19 DIAGNOSIS — N183 Chronic kidney disease, stage 3 unspecified: Secondary | ICD-10-CM | POA: Diagnosis not present

## 2021-12-19 DIAGNOSIS — D519 Vitamin B12 deficiency anemia, unspecified: Secondary | ICD-10-CM | POA: Diagnosis not present

## 2021-12-19 DIAGNOSIS — M5137 Other intervertebral disc degeneration, lumbosacral region: Secondary | ICD-10-CM | POA: Diagnosis not present

## 2021-12-19 DIAGNOSIS — N390 Urinary tract infection, site not specified: Secondary | ICD-10-CM | POA: Diagnosis not present

## 2021-12-19 DIAGNOSIS — M1990 Unspecified osteoarthritis, unspecified site: Secondary | ICD-10-CM | POA: Diagnosis not present

## 2021-12-19 DIAGNOSIS — Z9183 Wandering in diseases classified elsewhere: Secondary | ICD-10-CM | POA: Diagnosis not present

## 2021-12-19 DIAGNOSIS — K589 Irritable bowel syndrome without diarrhea: Secondary | ICD-10-CM | POA: Diagnosis not present

## 2021-12-19 DIAGNOSIS — R69 Illness, unspecified: Secondary | ICD-10-CM | POA: Diagnosis not present

## 2021-12-19 DIAGNOSIS — I700 Atherosclerosis of aorta: Secondary | ICD-10-CM | POA: Diagnosis not present

## 2021-12-19 DIAGNOSIS — R2231 Localized swelling, mass and lump, right upper limb: Secondary | ICD-10-CM | POA: Diagnosis not present

## 2021-12-19 DIAGNOSIS — M810 Age-related osteoporosis without current pathological fracture: Secondary | ICD-10-CM | POA: Diagnosis not present

## 2021-12-19 DIAGNOSIS — J309 Allergic rhinitis, unspecified: Secondary | ICD-10-CM | POA: Diagnosis not present

## 2021-12-19 DIAGNOSIS — I129 Hypertensive chronic kidney disease with stage 1 through stage 4 chronic kidney disease, or unspecified chronic kidney disease: Secondary | ICD-10-CM | POA: Diagnosis not present

## 2021-12-22 ENCOUNTER — Encounter (HOSPITAL_COMMUNITY): Payer: Self-pay

## 2021-12-22 ENCOUNTER — Other Ambulatory Visit: Payer: Self-pay

## 2021-12-22 ENCOUNTER — Emergency Department (HOSPITAL_COMMUNITY)
Admission: EM | Admit: 2021-12-22 | Discharge: 2021-12-22 | Disposition: A | Discharge status: Skilled Nursing Facility | Payer: Medicare HMO | Attending: Emergency Medicine | Admitting: Emergency Medicine

## 2021-12-22 DIAGNOSIS — R69 Illness, unspecified: Secondary | ICD-10-CM | POA: Diagnosis not present

## 2021-12-22 DIAGNOSIS — I10 Essential (primary) hypertension: Secondary | ICD-10-CM | POA: Diagnosis not present

## 2021-12-22 DIAGNOSIS — R1110 Vomiting, unspecified: Secondary | ICD-10-CM | POA: Diagnosis not present

## 2021-12-22 DIAGNOSIS — R531 Weakness: Secondary | ICD-10-CM

## 2021-12-22 DIAGNOSIS — F0390 Unspecified dementia without behavioral disturbance: Secondary | ICD-10-CM | POA: Insufficient documentation

## 2021-12-22 DIAGNOSIS — Z7401 Bed confinement status: Secondary | ICD-10-CM | POA: Diagnosis not present

## 2021-12-22 DIAGNOSIS — Z743 Need for continuous supervision: Secondary | ICD-10-CM | POA: Diagnosis not present

## 2021-12-22 DIAGNOSIS — R739 Hyperglycemia, unspecified: Secondary | ICD-10-CM | POA: Diagnosis not present

## 2021-12-22 LAB — CBC WITH DIFFERENTIAL/PLATELET
Abs Immature Granulocytes: 0.02 10*3/uL (ref 0.00–0.07)
Basophils Absolute: 0.1 10*3/uL (ref 0.0–0.1)
Basophils Relative: 1 %
Eosinophils Absolute: 0.1 10*3/uL (ref 0.0–0.5)
Eosinophils Relative: 3 %
HCT: 36.8 % (ref 36.0–46.0)
Hemoglobin: 11.5 g/dL — ABNORMAL LOW (ref 12.0–15.0)
Immature Granulocytes: 1 %
Lymphocytes Relative: 21 %
Lymphs Abs: 0.9 10*3/uL (ref 0.7–4.0)
MCH: 32 pg (ref 26.0–34.0)
MCHC: 31.3 g/dL (ref 30.0–36.0)
MCV: 102.5 fL — ABNORMAL HIGH (ref 80.0–100.0)
Monocytes Absolute: 0.4 10*3/uL (ref 0.1–1.0)
Monocytes Relative: 10 %
Neutro Abs: 2.8 10*3/uL (ref 1.7–7.7)
Neutrophils Relative %: 64 %
Platelets: 143 10*3/uL — ABNORMAL LOW (ref 150–400)
RBC: 3.59 MIL/uL — ABNORMAL LOW (ref 3.87–5.11)
RDW: 13.7 % (ref 11.5–15.5)
WBC: 4.3 10*3/uL (ref 4.0–10.5)
nRBC: 0 % (ref 0.0–0.2)

## 2021-12-22 LAB — URINALYSIS, ROUTINE W REFLEX MICROSCOPIC
Bilirubin Urine: NEGATIVE
Glucose, UA: NEGATIVE mg/dL
Hgb urine dipstick: NEGATIVE
Ketones, ur: NEGATIVE mg/dL
Leukocytes,Ua: NEGATIVE
Nitrite: NEGATIVE
Protein, ur: NEGATIVE mg/dL
Specific Gravity, Urine: 1.016 (ref 1.005–1.030)
pH: 5 (ref 5.0–8.0)

## 2021-12-22 LAB — BASIC METABOLIC PANEL
Anion gap: 6 (ref 5–15)
BUN: 15 mg/dL (ref 8–23)
CO2: 26 mmol/L (ref 22–32)
Calcium: 9.2 mg/dL (ref 8.9–10.3)
Chloride: 112 mmol/L — ABNORMAL HIGH (ref 98–111)
Creatinine, Ser: 1.2 mg/dL — ABNORMAL HIGH (ref 0.44–1.00)
GFR, Estimated: 42 mL/min — ABNORMAL LOW (ref >60)
Glucose, Bld: 121 mg/dL — ABNORMAL HIGH (ref 70–99)
Potassium: 4.1 mmol/L (ref 3.5–5.1)
Sodium: 144 mmol/L (ref 135–145)

## 2021-12-22 MED ORDER — LACTATED RINGERS IV SOLN: INTRAVENOUS | Status: DC

## 2021-12-22 NOTE — ED Notes (Signed)
Called report to [facility] at [phone] and spoke with staff member [place] regarding pt disposition.

## 2021-12-22 NOTE — ED Notes (Signed)
Urine sent to lab 

## 2021-12-22 NOTE — ED Notes (Signed)
[facility] is been call to transport patient back to [facility] senior living. Patient had son too patient code status paper home with him.

## 2021-12-22 NOTE — ED Notes (Signed)
I gave patient a sandwich and drink per nurse

## 2021-12-22 NOTE — ED Triage Notes (Signed)
Patient present to the ed with c/o UTI. Patient recently had UTI and was treated with antibiotic, but she have been throwing up her medication. Patient have decrease in urine output and decrease oral intake per nursing facility. Patient has dementia and at baseline.

## 2021-12-22 NOTE — ED Provider Notes (Signed)
[facility] DEPT Provider Note   CSN: [id number] Arrival date & time: 12/22/21  1402     History  No chief complaint on file.   Tiffany [name] is a 88 y.o. female.  88 year old female presents from nursing home due to UTI and emesis.  Patient has a history of dementia.  She is however alert and oriented x3.  Denies any abdominal or flank pain.  No fever or chills.  States that she had trouble taking down her medication.  Patient sent here for further evaluation.       Home Medications Prior to Admission medications   Medication Sig Start Date End Date Taking? Authorizing Provider  acetaminophen (TYLENOL) 500 MG tablet Take 1,000 mg by mouth in the morning and at bedtime.    [provider]  furosemide (LASIX) 40 MG tablet Take 1 tablet (40 mg total) by mouth daily. Patient taking differently: Take 40 mg by mouth every other day. 10/05/20   Sheikh, Omair Latif, DO  irbesartan (AVAPRO) 300 MG tablet Take 300 mg by mouth every morning.    [provider]  levothyroxine (SYNTHROID) 75 MCG tablet Take 1 tablet (75 mcg total) by mouth daily at 6 (six) AM. 11/27/21   Gherghe, Vella Redhead, MD  loperamide (IMODIUM A-D) 2 MG tablet Take 1 mg by mouth daily as needed for diarrhea or loose stools.    [provider]  metoprolol (LOPRESSOR) 50 MG tablet Take 50 mg by mouth 2 (two) times daily.    [provider]      Allergies    Codeine, Morphine and related, and Sulfa antibiotics    Review of Systems   Review of Systems  All other systems reviewed and are negative.   Physical Exam Updated Vital Signs BP (!) 218/73 (BP Location: Right Arm)   Pulse (!) 59   Temp 98 F (36.7 C) (Oral)   Resp 17   SpO2 100%  Physical Exam Vitals and nursing note reviewed.  Constitutional:      General: She is not in acute distress.    Appearance: Normal appearance. She is well-developed. She is not toxic-appearing.  HENT:     Head:  Normocephalic and atraumatic.  Eyes:     General: Lids are normal.     Conjunctiva/sclera: Conjunctivae normal.     Pupils: Pupils are equal, round, and reactive to light.  Neck:     Thyroid: No thyroid mass.     Trachea: No tracheal deviation.  Cardiovascular:     Rate and Rhythm: Normal rate and regular rhythm.     Heart sounds: Normal heart sounds. No murmur heard.    No gallop.  Pulmonary:     Effort: Pulmonary effort is normal. No respiratory distress.     Breath sounds: Normal breath sounds. No stridor. No decreased breath sounds, wheezing, rhonchi or rales.  Abdominal:     General: There is no distension.     Palpations: Abdomen is soft.     Tenderness: There is no abdominal tenderness. There is no rebound.  Musculoskeletal:        General: No tenderness. Normal range of motion.     Cervical back: Normal range of motion and neck supple.  Skin:    General: Skin is warm and dry.     Findings: No abrasion or rash.  Neurological:     Mental Status: She is alert and oriented to person, place, and time. Mental status is at baseline.  GCS: GCS eye subscore is 4. GCS verbal subscore is 5. GCS motor subscore is 6.     Cranial Nerves: No cranial nerve deficit.     Sensory: No sensory deficit.     Motor: Motor function is intact.  Psychiatric:        Attention and Perception: Attention normal.        Speech: Speech normal.        Behavior: Behavior normal.     ED Results / Procedures / Treatments   Labs (all labs ordered are listed, but only abnormal results are displayed) Labs Reviewed  CBC WITH DIFFERENTIAL/PLATELET  URINALYSIS, ROUTINE W REFLEX MICROSCOPIC  BASIC METABOLIC PANEL    EKG None  Radiology No results found.  Procedures Procedures    Medications Ordered in ED Medications  lactated ringers infusion (has no administration in time range)    ED Course/ Medical Decision Making/ A&P                           Medical Decision Making Amount  and/or Complexity of Data Reviewed Labs: ordered.  Risk Prescription drug management.  Labs reviewed no evidence of leukocytosis.  Doubt serious infection. On discussion with family at bedside who states that patient has had dementia for some time.  They are working on placement at this time.  Son states he has failed FL 2 form.  They do have home health for a few hours a day.  He checks in on her as well 2. Patient given IV fluids here.  Her urinalysis was checked and was negative for infection.  She is eating potato chips and is hungry for [place] sandwich which is at bedside.  Patient has no indication admission at this time and will be discharged home        Final Clinical Impression(s) / ED Diagnoses Final diagnoses:  None    Rx / DC Orders ED Discharge Orders     None         Lacretia Leigh, MD 12/22/21 1811

## 2021-12-22 NOTE — ED Notes (Signed)
Patient blood pressure high. Dr. Zenia Resides is made aware. No further orders given.

## 2021-12-22 NOTE — ED Notes (Signed)
Provider aware of pt's elevated BP

## 2022-01-02 DIAGNOSIS — M5137 Other intervertebral disc degeneration, lumbosacral region: Secondary | ICD-10-CM | POA: Diagnosis not present

## 2022-01-02 DIAGNOSIS — I700 Atherosclerosis of aorta: Secondary | ICD-10-CM | POA: Diagnosis not present

## 2022-01-02 DIAGNOSIS — R2231 Localized swelling, mass and lump, right upper limb: Secondary | ICD-10-CM | POA: Diagnosis not present

## 2022-01-02 DIAGNOSIS — M810 Age-related osteoporosis without current pathological fracture: Secondary | ICD-10-CM | POA: Diagnosis not present

## 2022-01-02 DIAGNOSIS — D519 Vitamin B12 deficiency anemia, unspecified: Secondary | ICD-10-CM | POA: Diagnosis not present

## 2022-01-02 DIAGNOSIS — N390 Urinary tract infection, site not specified: Secondary | ICD-10-CM | POA: Diagnosis not present

## 2022-01-02 DIAGNOSIS — K589 Irritable bowel syndrome without diarrhea: Secondary | ICD-10-CM | POA: Diagnosis not present

## 2022-01-02 DIAGNOSIS — M1990 Unspecified osteoarthritis, unspecified site: Secondary | ICD-10-CM | POA: Diagnosis not present

## 2022-01-02 DIAGNOSIS — E039 Hypothyroidism, unspecified: Secondary | ICD-10-CM | POA: Diagnosis not present

## 2022-01-02 DIAGNOSIS — J309 Allergic rhinitis, unspecified: Secondary | ICD-10-CM | POA: Diagnosis not present

## 2022-01-02 DIAGNOSIS — R69 Illness, unspecified: Secondary | ICD-10-CM | POA: Diagnosis not present

## 2022-01-02 DIAGNOSIS — Z86718 Personal history of other venous thrombosis and embolism: Secondary | ICD-10-CM | POA: Diagnosis not present

## 2022-01-02 DIAGNOSIS — G309 Alzheimer's disease, unspecified: Secondary | ICD-10-CM | POA: Diagnosis not present

## 2022-01-02 DIAGNOSIS — N183 Chronic kidney disease, stage 3 unspecified: Secondary | ICD-10-CM | POA: Diagnosis not present

## 2022-01-02 DIAGNOSIS — Z9183 Wandering in diseases classified elsewhere: Secondary | ICD-10-CM | POA: Diagnosis not present

## 2022-01-02 DIAGNOSIS — I129 Hypertensive chronic kidney disease with stage 1 through stage 4 chronic kidney disease, or unspecified chronic kidney disease: Secondary | ICD-10-CM | POA: Diagnosis not present

## 2022-01-10 ENCOUNTER — Other Ambulatory Visit: Payer: Medicare HMO

## 2022-01-10 DIAGNOSIS — Z515 Encounter for palliative care: Secondary | ICD-10-CM

## 2022-01-10 NOTE — Progress Notes (Signed)
PATIENT NAME: Tiffany [name] DOB: [birth year] MRN: [id number]  PRIMARY CARE PROVIDER: Donald Prose, MD  RESPONSIBLE PARTY:  Acct ID - Guarantor Home Phone Work Phone Relationship Acct Type  [id number] - Kiser,DO* [phone]  Self P/F     [address], [place], [place] [address]    Return call made to son [name].  He has advised of patient's decline in overall status.  Patient was hospitalized in the last month due to UTI and also has CKD.  Due to patient's decline, care had become more [place].   Son has applied for Medicaid but is having a difficult time connecting with the DSS case worker assigned to the patient.  I have encouraged him to contact the main DSS number and ask to speak with a supervisor since he has not heard back from the case worker.  Patient attempted to elope from the building about 2 weeks ago.  She managed to get to the main lobby and was attempting to leave at 4 am to look for her son.  Hallucinations have worsened.  Patient is now incontinent of bowel and bladder.  She is unable to safely ambulate independently and mostly stays in her bed or on the sofa.  She has a Database administrator that comes for 2-3 hours daily to assist with ADL's.  Appetite has decreased and weight loss is likely as clothes are looser on patient.  We discussed possible hospice evaluation and so is open to this.  Damaris Hippo, NP will assess patient tomorrow and further discuss options with son.   Lorenza Burton, RN

## 2022-01-11 ENCOUNTER — Other Ambulatory Visit: Payer: Medicare HMO | Admitting: Family Medicine

## 2022-01-11 ENCOUNTER — Encounter: Payer: Self-pay | Admitting: Family Medicine

## 2022-01-11 ENCOUNTER — Inpatient Hospital Stay (HOSPITAL_COMMUNITY)
Admission: EM | Admit: 2022-01-11 | Discharge: 2022-01-21 | DRG: 689 | Disposition: A | Discharge status: Skilled Nursing Facility | Payer: Medicare HMO | Attending: Internal Medicine | Admitting: Internal Medicine

## 2022-01-11 ENCOUNTER — Encounter (HOSPITAL_COMMUNITY): Payer: Self-pay | Admitting: Emergency Medicine

## 2022-01-11 VITALS — BP 140/80 | HR 56 | Resp 18

## 2022-01-11 DIAGNOSIS — N3000 Acute cystitis without hematuria: Secondary | ICD-10-CM | POA: Diagnosis not present

## 2022-01-11 DIAGNOSIS — Z833 Family history of diabetes mellitus: Secondary | ICD-10-CM

## 2022-01-11 DIAGNOSIS — I129 Hypertensive chronic kidney disease with stage 1 through stage 4 chronic kidney disease, or unspecified chronic kidney disease: Secondary | ICD-10-CM | POA: Diagnosis present

## 2022-01-11 DIAGNOSIS — Z79890 Hormone replacement therapy (postmenopausal): Secondary | ICD-10-CM

## 2022-01-11 DIAGNOSIS — R296 Repeated falls: Secondary | ICD-10-CM | POA: Diagnosis present

## 2022-01-11 DIAGNOSIS — L8990 Pressure ulcer of unspecified site, unspecified stage: Secondary | ICD-10-CM | POA: Diagnosis present

## 2022-01-11 DIAGNOSIS — R4182 Altered mental status, unspecified: Principal | ICD-10-CM

## 2022-01-11 DIAGNOSIS — Z8249 Family history of ischemic heart disease and other diseases of the circulatory system: Secondary | ICD-10-CM

## 2022-01-11 DIAGNOSIS — F0282 Dementia in other diseases classified elsewhere, unspecified severity, with psychotic disturbance: Secondary | ICD-10-CM | POA: Diagnosis present

## 2022-01-11 DIAGNOSIS — R8290 Unspecified abnormal findings in urine: Secondary | ICD-10-CM | POA: Diagnosis not present

## 2022-01-11 DIAGNOSIS — R001 Bradycardia, unspecified: Secondary | ICD-10-CM | POA: Diagnosis present

## 2022-01-11 DIAGNOSIS — Z20822 Contact with and (suspected) exposure to covid-19: Secondary | ICD-10-CM | POA: Diagnosis present

## 2022-01-11 DIAGNOSIS — Z79899 Other long term (current) drug therapy: Secondary | ICD-10-CM

## 2022-01-11 DIAGNOSIS — G301 Alzheimer's disease with late onset: Secondary | ICD-10-CM

## 2022-01-11 DIAGNOSIS — F02C2 Dementia in other diseases classified elsewhere, severe, with psychotic disturbance: Secondary | ICD-10-CM | POA: Diagnosis not present

## 2022-01-11 DIAGNOSIS — M810 Age-related osteoporosis without current pathological fracture: Secondary | ICD-10-CM | POA: Diagnosis present

## 2022-01-11 DIAGNOSIS — Z83511 Family history of glaucoma: Secondary | ICD-10-CM

## 2022-01-11 DIAGNOSIS — L89156 Pressure-induced deep tissue damage of sacral region: Secondary | ICD-10-CM

## 2022-01-11 DIAGNOSIS — Z853 Personal history of malignant neoplasm of breast: Secondary | ICD-10-CM

## 2022-01-11 DIAGNOSIS — Z882 Allergy status to sulfonamides status: Secondary | ICD-10-CM

## 2022-01-11 DIAGNOSIS — E860 Dehydration: Secondary | ICD-10-CM | POA: Diagnosis not present

## 2022-01-11 DIAGNOSIS — Z87440 Personal history of urinary (tract) infections: Secondary | ICD-10-CM

## 2022-01-11 DIAGNOSIS — N183 Chronic kidney disease, stage 3 unspecified: Secondary | ICD-10-CM | POA: Diagnosis present

## 2022-01-11 DIAGNOSIS — R627 Adult failure to thrive: Secondary | ICD-10-CM | POA: Diagnosis present

## 2022-01-11 DIAGNOSIS — E039 Hypothyroidism, unspecified: Secondary | ICD-10-CM | POA: Diagnosis present

## 2022-01-11 DIAGNOSIS — N390 Urinary tract infection, site not specified: Principal | ICD-10-CM | POA: Diagnosis present

## 2022-01-11 DIAGNOSIS — I10 Essential (primary) hypertension: Secondary | ICD-10-CM | POA: Diagnosis not present

## 2022-01-11 DIAGNOSIS — I160 Hypertensive urgency: Secondary | ICD-10-CM | POA: Diagnosis present

## 2022-01-11 DIAGNOSIS — Z85828 Personal history of other malignant neoplasm of skin: Secondary | ICD-10-CM

## 2022-01-11 DIAGNOSIS — R69 Illness, unspecified: Secondary | ICD-10-CM | POA: Diagnosis not present

## 2022-01-11 DIAGNOSIS — E43 Unspecified severe protein-calorie malnutrition: Secondary | ICD-10-CM | POA: Diagnosis present

## 2022-01-11 DIAGNOSIS — N1832 Chronic kidney disease, stage 3b: Secondary | ICD-10-CM | POA: Diagnosis present

## 2022-01-11 DIAGNOSIS — Z96659 Presence of unspecified artificial knee joint: Secondary | ICD-10-CM | POA: Diagnosis present

## 2022-01-11 DIAGNOSIS — Z885 Allergy status to narcotic agent status: Secondary | ICD-10-CM

## 2022-01-11 DIAGNOSIS — G309 Alzheimer's disease, unspecified: Secondary | ICD-10-CM | POA: Diagnosis present

## 2022-01-11 DIAGNOSIS — L89151 Pressure ulcer of sacral region, stage 1: Secondary | ICD-10-CM | POA: Diagnosis present

## 2022-01-11 DIAGNOSIS — Z66 Do not resuscitate: Secondary | ICD-10-CM | POA: Diagnosis present

## 2022-01-11 DIAGNOSIS — G9341 Metabolic encephalopathy: Secondary | ICD-10-CM | POA: Diagnosis present

## 2022-01-11 LAB — CBC WITH DIFFERENTIAL/PLATELET
Abs Immature Granulocytes: 0.02 10*3/uL (ref 0.00–0.07)
Basophils Absolute: 0 10*3/uL (ref 0.0–0.1)
Basophils Relative: 1 %
Eosinophils Absolute: 0.1 10*3/uL (ref 0.0–0.5)
Eosinophils Relative: 2 %
HCT: 37.8 % (ref 36.0–46.0)
Hemoglobin: 11.7 g/dL — ABNORMAL LOW (ref 12.0–15.0)
Immature Granulocytes: 0 %
Lymphocytes Relative: 21 %
Lymphs Abs: 1 10*3/uL (ref 0.7–4.0)
MCH: 31.7 pg (ref 26.0–34.0)
MCHC: 31 g/dL (ref 30.0–36.0)
MCV: 102.4 fL — ABNORMAL HIGH (ref 80.0–100.0)
Monocytes Absolute: 0.5 10*3/uL (ref 0.1–1.0)
Monocytes Relative: 9 %
Neutro Abs: 3.3 10*3/uL (ref 1.7–7.7)
Neutrophils Relative %: 67 %
Platelets: 140 10*3/uL — ABNORMAL LOW (ref 150–400)
RBC: 3.69 MIL/uL — ABNORMAL LOW (ref 3.87–5.11)
RDW: 12.9 % (ref 11.5–15.5)
WBC: 4.9 10*3/uL (ref 4.0–10.5)
nRBC: 0 % (ref 0.0–0.2)

## 2022-01-11 LAB — URINALYSIS, ROUTINE W REFLEX MICROSCOPIC
Bacteria, UA: NONE SEEN
Bilirubin Urine: NEGATIVE
Glucose, UA: NEGATIVE mg/dL
Hgb urine dipstick: NEGATIVE
Ketones, ur: NEGATIVE mg/dL
Nitrite: NEGATIVE
Protein, ur: NEGATIVE mg/dL
Specific Gravity, Urine: 1.017 (ref 1.005–1.030)
pH: 5 (ref 5.0–8.0)

## 2022-01-11 LAB — D-DIMER, QUANTITATIVE: D-Dimer, Quant: 1.62 ug/mL-FEU — ABNORMAL HIGH (ref 0.00–0.50)

## 2022-01-11 LAB — RESP PANEL BY RT-PCR (FLU A&B, COVID) ARPGX2
Influenza A by PCR: NEGATIVE
Influenza B by PCR: NEGATIVE
SARS Coronavirus 2 by RT PCR: NEGATIVE

## 2022-01-11 LAB — COMPREHENSIVE METABOLIC PANEL
ALT: 9 U/L (ref 0–44)
AST: 14 U/L — ABNORMAL LOW (ref 15–41)
Albumin: 3.3 g/dL — ABNORMAL LOW (ref 3.5–5.0)
Alkaline Phosphatase: 53 U/L (ref 38–126)
Anion gap: 4 — ABNORMAL LOW (ref 5–15)
BUN: 25 mg/dL — ABNORMAL HIGH (ref 8–23)
CO2: 25 mmol/L (ref 22–32)
Calcium: 9.2 mg/dL (ref 8.9–10.3)
Chloride: 110 mmol/L (ref 98–111)
Creatinine, Ser: 1.17 mg/dL — ABNORMAL HIGH (ref 0.44–1.00)
GFR, Estimated: 44 mL/min — ABNORMAL LOW (ref >60)
Glucose, Bld: 138 mg/dL — ABNORMAL HIGH (ref 70–99)
Potassium: 4.2 mmol/L (ref 3.5–5.1)
Sodium: 139 mmol/L (ref 135–145)
Total Bilirubin: 0.7 mg/dL (ref 0.3–1.2)
Total Protein: 5.9 g/dL — ABNORMAL LOW (ref 6.5–8.1)

## 2022-01-11 LAB — LACTIC ACID, PLASMA
Lactic Acid, Venous: 1 mmol/L (ref 0.5–1.9)
Lactic Acid, Venous: 1.1 mmol/L (ref 0.5–1.9)

## 2022-01-11 MED ORDER — HYDRALAZINE HCL 25 MG PO TABS
25.0000 mg | ORAL_TABLET | Freq: Four times a day (QID) | ORAL | Status: DC | PRN
  Administered 2022-01-11 – 2022-01-20 (×4): 25 mg via ORAL
  Filled 2022-01-11 (×4): qty 1

## 2022-01-11 MED ORDER — ZINC SULFATE 220 (50 ZN) MG PO CAPS
220.0000 mg | ORAL_CAPSULE | Freq: Every day | ORAL | Status: DC
  Administered 2022-01-11 – 2022-01-21 (×11): 220 mg via ORAL
  Filled 2022-01-11 (×11): qty 1

## 2022-01-11 MED ORDER — HYDRALAZINE HCL 20 MG/ML IJ SOLN
10.0000 mg | Freq: Once | INTRAMUSCULAR | Status: AC
  Administered 2022-01-11: 10 mg via INTRAVENOUS
  Filled 2022-01-11: qty 1

## 2022-01-11 MED ORDER — NYSTATIN 100000 UNIT/GM EX POWD
Freq: Three times a day (TID) | CUTANEOUS | Status: DC
  Administered 2022-01-18 – 2022-01-20 (×2): 1 via TOPICAL
  Filled 2022-01-11: qty 15

## 2022-01-11 MED ORDER — ONDANSETRON HCL 4 MG PO TABS: 4.0000 mg | ORAL_TABLET | Freq: Four times a day (QID) | ORAL | Status: DC | PRN

## 2022-01-11 MED ORDER — ONDANSETRON HCL 4 MG/2ML IJ SOLN: 4.0000 mg | Freq: Four times a day (QID) | INTRAMUSCULAR | Status: DC | PRN

## 2022-01-11 MED ORDER — AMLODIPINE BESYLATE 5 MG PO TABS
2.5000 mg | ORAL_TABLET | Freq: Every day | ORAL | Status: DC
  Administered 2022-01-11 – 2022-01-14 (×4): 2.5 mg via ORAL
  Filled 2022-01-11 (×5): qty 1

## 2022-01-11 MED ORDER — ACETAMINOPHEN 325 MG PO TABS
650.0000 mg | ORAL_TABLET | Freq: Four times a day (QID) | ORAL | Status: DC | PRN
  Administered 2022-01-12 – 2022-01-17 (×4): 650 mg via ORAL
  Filled 2022-01-11 (×4): qty 2

## 2022-01-11 MED ORDER — ENOXAPARIN SODIUM 30 MG/0.3ML IJ SOSY
30.0000 mg | PREFILLED_SYRINGE | INTRAMUSCULAR | Status: DC
Start: 2022-01-11 — End: 2022-01-21
  Administered 2022-01-11 – 2022-01-20 (×9): 30 mg via SUBCUTANEOUS
  Filled 2022-01-11 (×9): qty 0.3

## 2022-01-11 MED ORDER — ACETAMINOPHEN 650 MG RE SUPP: 650.0000 mg | Freq: Four times a day (QID) | RECTAL | Status: DC | PRN

## 2022-01-11 MED ORDER — VITAMIN C 500 MG PO TABS
500.0000 mg | ORAL_TABLET | Freq: Every day | ORAL | Status: DC
  Administered 2022-01-11 – 2022-01-21 (×11): 500 mg via ORAL
  Filled 2022-01-11 (×11): qty 1

## 2022-01-11 MED ORDER — SODIUM CHLORIDE 0.9 % IV SOLN
1.0000 g | INTRAVENOUS | Status: AC
  Administered 2022-01-12 – 2022-01-15 (×4): 1 g via INTRAVENOUS
  Filled 2022-01-11 (×5): qty 10

## 2022-01-11 MED ORDER — LACTATED RINGERS IV SOLN: INTRAVENOUS | Status: AC

## 2022-01-11 MED ORDER — SODIUM CHLORIDE 0.9 % IV SOLN
1.0000 g | Freq: Once | INTRAVENOUS | Status: AC
  Administered 2022-01-11: 1 g via INTRAVENOUS
  Filled 2022-01-11: qty 10

## 2022-01-11 MED ORDER — NIRMATRELVIR/RITONAVIR (PAXLOVID) TABLET (RENAL DOSING)
2.0000 | ORAL_TABLET | Freq: Two times a day (BID) | ORAL | Status: DC
  Filled 2022-01-11: qty 20

## 2022-01-11 MED ORDER — MOLNUPIRAVIR EUA 200MG CAPSULE: 4.0000 | ORAL_CAPSULE | Freq: Two times a day (BID) | ORAL | Status: DC

## 2022-01-11 NOTE — Progress Notes (Signed)
[facility] Pall[facility] Consult Note Telephone: [phone]  Fax: [phone]    Date of encounter: 01/11/22 9:49 AM PATIENT NAME: Araina Butrick [address] [place] [address]   [phone] (home)  DOB: [birth year] MRN: [id number] PRIMARY CARE PROVIDER:    Donald Prose, MD,  [place] [address] [phone]  REFERRING PROVIDER:   Donald Prose, MD [address] [place],  [place] [address] [phone]  RESPONSIBLE PARTY:    Contact Information     Name Relation Home Work Mobile   Fisher Son [phone]  [phone]   [place] Relative   [phone]        I met face to face with patient and son [name] in her independent living facility. Palliative Care was asked to follow this patient by consultation request of  Donald Prose, MD to address advance care planning and complex medical decision making. This is a follow up visit   ASSESSMENT , SYMPTOM MANAGEMENT AND PLAN / RECOMMENDATIONS:  Foul smelling urine Has had 2 recurrent UTIs in the last 3 months with 2 requiring hospitalization-for E coli and Citrobacter. Incontinent and in independent living, son has been working for skilled placement Recommend admission with lab assessment of renal function and nutritional status  2.  Protein Calorie malnutrition/dehydration Poor nutritional intake and poor fluid intake Needs CMP  3. Late onset Alzheimer's Dementia with behavioral disturbance FAST 7 score 6e PPS declined from 40 to 30% with poor intake and dehydration Disturbing hallucinations for the last 6 weeks. Recommend admission, PT assessment of potential rehab, TB skin test and CXR.  4.  Pressure injury of deep tissue of sacrum Needs evaluation by wound nurse for care Alleviate pressure--pt poorly able to reposition due to cognitive status and alleviate pressure and incontinent   Advance Care Planning/Goals  of Care: Goals include to maximize quality of life and symptom management. Patient/health care surrogate gave his/her permission to discuss.  Identification of a healthcare agent -son Gevena Barre is [facility] POA Review of DNR and MOST. Decision not to resuscitate or to de-escalate disease focused treatments due to poor prognosis. CODE STATUS: MOST DNR/DNI Comfort No antibiotics, IV fluids or feeding tubes     Follow up Palliative Care Visit: Palliative care will continue to follow for complex medical decision making, advance care planning, and clarification of goals. Return 4 weeks or prn.    This visit was coded based on medical decision making (MDM).  PPS: 30%  HOSPICE ELIGIBILITY/DIAGNOSIS: TBD  Chief Complaint:  Palliative Care is following for chronic medical management in setting of dementia.  HISTORY OF PRESENT ILLNESS:  Lilou Kneip is a 88 y.o. year old female with dementia, HTN, hx of embolic CVA, hypothyroidism, CKD stage 3, frequent falls, and malnutrition of moderate degree. During hospitalization, TSH was found to be low and Synthroid decreased to 75 mcg daily.  Received call from son (Bur[place]) [name].  He states pt is refusing most meals or only eating bites, is not drinking and has very foul smelling urine.  Pt has been trying to wander out to the front desk at 4 am "to meet her father who came to pick her up" and is very unsteady on her feet such that he had to remove access to her walker unless someone was present.  He has someone  paid coming in to help her for about 3 hours a day but otherwise he is in for another 3-4  hours around his job.  PCP has completed FL2 and he has applied for LTC Medicaid but has been unable to get placement yet.  In the last 6 weeks or so pt has been hallucinating that there are men outside her window that are coming in to kill her. She has had 2 hospitalizations in the last 3 months for UTI, one with E coli, the other Citrobacter.  Her eating has  declined and she can only ambulate to the bathroom with rolling walker and significant assistance. Yesterday the only thing she agreed to eat was less than half a cheese stick and less than 4 ounces of fluid.  She is incontinent of bowel and bladder, has to have help with bathing and dressing and has severe scoliosis.  She has bruising and discoloration of her right great, 2nd and 3rd toes with no idea how this occurred.  Spoke with Dr Nancy Fetter, PCP, who states that she has been working on placement with pt's son for the last several months. At times pt will have choking with taking her daily pills. Pt repeats comments multiple times in a row and asks the same questions. Pt states she eats well and drinks well but drinks are only partially gone sitting around her apartment and son says she often refuses. Son states at times pt does not recognize him as her son.    History obtained from review of EMR, discussion with primary team, and interview with family and/or Ms. Jaquith.   12/12/21 Urine culture + for Citrobacter  09/04/21 CMP remarkable for elevated glucose 148, CR 1.70, CL 110, BUN 37 and low eGFR 28  I reviewed EMR for available labs, medications, imaging, studies and related documents.  There are no new records since last visit/Records reviewed and summarized above.   ROS Limited due to advanced dementia ENMT: endorses some dysphagia with meds Cardiovascular: denies chest pain, denies DOE Pulmonary: denies cough, denies increased SOB Abdomen: endorses poor appetite, endorses incontinence of bowel (per son) GU: endorses incontinence of urine and foul smelling urine (per son) MSK:  no recent falls reported Neurological: denies pain Psych: Endorses hallucinations  Physical Exam: Current and past weights: Last weigh, unknown if actual or stated 147 lbs Constitutional: NAD General: frail appearing CV: S1S2, RRR, no LE edema Pulmonary: CTA on right, diminished but clear on left, no increased  work of breathing, no cough, room air Abdomen: hypo-active BS + 4 quadrants, soft and non tender MSK: no sarcopenia, moves all extremities, minimally ambulatory with assistance.  Noted severe scoliosis with thoracic kyphosis most pronounced on right side Skin: warm and dry, erythematous/ inflamed rash lateral and under right breast (likely candidiasis). Deep purple/black discoloration on bilat sides of gluteal fold which does not reverse/blanch with removal of pressure Neuro:  no generalized weakness,  noted severe cognitive impairment Psych: non-anxious affect, A and O to self Hem/lymph/immuno: no widespread bruising   Thank you for the opportunity to participate in the care of Ms. Starlin.  The palliative care team will continue to follow. Please call our office at [phone] if we can be of additional assistance.   Marijo Conception, FNP -C  COVID-19 PATIENT SCREENING TOOL Asked and negative response unless otherwise noted:   Have you had symptoms of covid, tested positive or been in contact with someone with symptoms/positive test in the past 5-10 days?  No

## 2022-01-11 NOTE — ED Triage Notes (Signed)
Patient here from independent living - [facility] reporting uti ongoing - worse on Cipro.

## 2022-01-11 NOTE — ED Provider Notes (Signed)
[name] Provider Note   CSN: [id number] Arrival date & time: 01/11/22  1218     History PMH: HTN, Hypothyroididsm, CKD stage 3, Alzheimer's, hx of CVA Chief Complaint  Patient presents with   Recurrent UTI    Tiffany [name] is a 88 y.o. female.  Patient is here for recurrent UTI.  Per patient's son, patient had a urinary tract infection a couple weeks ago and was placed on Cipro.  She has been off of this for 2 weeks but he feels like the smell of the urine has come back and her mentation is worsening as well.  He also says that she has not been eating or drinking much this week.  He states that she has been in independent living, but is no longer able to care for herself due to her worsening dementia. She is now having hallucinations.  She has a caregiver come in for couple of hours a day but is mostly by herself.  She is unable to walk without falling.  States that she is not able to stay with him and there are no other family members to do this..  Per chart review, she has had two recurrent UTIs over the last 3 months with two requiring hospitalization for E coli and Citrobacter. She was just seen by palliative care.   HPI     Home Medications Prior to Admission medications   Medication Sig Start Date End Date Taking? Authorizing Provider  acetaminophen (TYLENOL) 500 MG tablet Take 1,000 mg by mouth in the morning and at bedtime.    [provider]  furosemide (LASIX) 40 MG tablet Take 1 tablet (40 mg total) by mouth daily. Patient taking differently: Take 80 mg by mouth as needed for edema. 10/05/20   Sheikh, Omair Latif, DO  irbesartan (AVAPRO) 300 MG tablet Take 300 mg by mouth every morning.    [provider]  levothyroxine (SYNTHROID) 75 MCG tablet Take 1 tablet (75 mcg total) by mouth daily at 6 (six) AM. 11/27/21   Gherghe, Vella Redhead, MD  loperamide (IMODIUM A-D) 2 MG tablet Take 1 mg by mouth daily as needed for  diarrhea or loose stools.    [provider]  metoprolol (LOPRESSOR) 50 MG tablet Take 50 mg by mouth 2 (two) times daily.    [provider]      Allergies    Codeine, Morphine and related, and Sulfa antibiotics    Review of Systems   Review of Systems  Genitourinary:        Worsening urine smell  Musculoskeletal:  Positive for gait problem.  Psychiatric/Behavioral:  Positive for behavioral problems, confusion and hallucinations.   All other systems reviewed and are negative.   Physical Exam Updated Vital Signs BP (!) 204/66   Pulse (!) 56   Temp 98.1 F (36.7 C) (Oral)   Resp 14   SpO2 98%  Physical Exam Vitals and nursing note reviewed.  Constitutional:      General: She is not in acute distress.    Appearance: Normal appearance. She is well-developed. She is not ill-appearing, toxic-appearing or diaphoretic.  HENT:     Head: Normocephalic and atraumatic.     Nose: No nasal deformity.     Mouth/Throat:     Lips: Pink. No lesions.     Mouth: Mucous membranes are moist.     Pharynx: Oropharynx is clear. No oropharyngeal exudate or posterior oropharyngeal erythema.  Eyes:     General:  Gaze aligned appropriately. No scleral icterus.       Right eye: No discharge.        Left eye: No discharge.     Conjunctiva/sclera: Conjunctivae normal.     Right eye: Right conjunctiva is not injected. No exudate or hemorrhage.    Left eye: Left conjunctiva is not injected. No exudate or hemorrhage. Cardiovascular:     Rate and Rhythm: Normal rate and regular rhythm.     Pulses: Normal pulses.     Heart sounds: Normal heart sounds. No murmur heard.    No friction rub. No gallop.  Pulmonary:     Effort: Pulmonary effort is normal. No respiratory distress.     Breath sounds: Normal breath sounds. No stridor. No wheezing, rhonchi or rales.  Abdominal:     General: Abdomen is flat. There is no distension.     Palpations: Abdomen is soft.     Tenderness: There is no  abdominal tenderness. There is no guarding or rebound.  Musculoskeletal:     Right lower leg: No edema.     Left lower leg: No edema.  Skin:    General: Skin is warm and dry.  Neurological:     Mental Status: She is alert. Mental status is at baseline. She is disoriented.  Psychiatric:        Mood and Affect: Mood normal.        Speech: Speech normal.        Behavior: Behavior normal. Behavior is cooperative.     ED Results / Procedures / Treatments   Labs (all labs ordered are listed, but only abnormal results are displayed) Labs Reviewed  CBC WITH DIFFERENTIAL/PLATELET - Abnormal; Notable for the following components:      Result Value   RBC 3.69 (*)    Hemoglobin 11.7 (*)    MCV 102.4 (*)    Platelets 140 (*)    All other components within normal limits  COMPREHENSIVE METABOLIC PANEL - Abnormal; Notable for the following components:   Glucose, Bld 138 (*)    BUN 25 (*)    Creatinine, Ser 1.17 (*)    Total Protein 5.9 (*)    Albumin 3.3 (*)    AST 14 (*)    GFR, Estimated 44 (*)    Anion gap 4 (*)    All other components within normal limits  URINALYSIS, ROUTINE W REFLEX MICROSCOPIC - Abnormal; Notable for the following components:   Leukocytes,Ua SMALL (*)    All other components within normal limits  RESP PANEL BY RT-PCR (FLU A&B, COVID) ARPGX2  URINE CULTURE  LACTIC ACID, PLASMA  LACTIC ACID, PLASMA    EKG None  Radiology No results found.  Procedures Procedures  This patient was on telemetry or cardiac monitoring during their time in the ED.    Medications Ordered in ED Medications - No data to display  ED Course/ Medical Decision Making/ A&P                            Medical Decision Making Amount and/or Complexity of Data Reviewed Labs: ordered.  Risk Decision regarding hospitalization.    MDM  This is a 88 y.o. female who presents to the ED with recurrent UTI and worsening dementia The differential of this patient includes but is not  limited to UTI  Initial Impression  Patient has a history of dementia who is presenting with recurrent UTI symptoms after failing outpatient  measures on Cipro. She also is having worsening dementia symptoms and failure to thrive. She is to the point where she is unable to care for herself. If urine does appear infected or other medical problem is identified, she will need to be admitted to the hospital. If no UTI, she will need to be placed into a new facility  3:27 PM Care of Keasia Dubose transferred to [facility] and Dr. Ernesto Rutherford at the end of my shift as the patient will require reassessment once labs/imaging have resulted. Patient presentation, ED course, and plan of care discussed with review of all pertinent labs and imaging. Please see his/her note for further details regarding further ED course and disposition. Plan at time of handoff is to f/u on labs/urine, see plan above. This may be altered or completely changed at the discretion of the oncoming team pending results of further workup.    Charting Requirements Additional history is obtained from:  Child External Records from outside source obtained and reviewed including:  Palliative Care notes Social Determinants of Health:   Dementia Pertinant PMH that complicates patient's illness: Dementia, recurrent UTIs  Patient Care Problems that were addressed during this visit: - Urinary Symptoms: Acute illness - Failure to Thrive: Acute illness Medications given in ED: n/a Reevaluation of the patient after these medicines showed that the patient stayed the same I have reviewed home medications and made changes accordingly.  Critical Care Interventions: n/a Consultations: n/a Disposition: see oncoming provider note  This is a shared visit with my attending physician, Dr. Zenia Resides.  We have discussed this patient and they have independently evaluated this patient. The plan was altered or changed as needed.  Portions of this note were  generated with [employer]. Dictation errors may occur despite best attempts at proofreading.     Final Clinical Impression(s) / ED Diagnoses Final diagnoses:  Altered mental status, unspecified altered mental status type  Hypertensive urgency  Acute cystitis without hematuria    Rx / DC Orders ED Discharge Orders     None         Adolphus Birchwood, PA-C 01/11/22 1527    Lacretia Leigh, MD 01/14/22 1008

## 2022-01-11 NOTE — Progress Notes (Signed)
[facility]) Hospital Liaison note:  This patient is currently enrolled in [facility] outpatient-based Palliative Care. Will continue to follow for disposition.  Please call with any outpatient palliative questions or concerns.  Thank you, Lorelee Market, LPN [facility] Liaison [phone]

## 2022-01-11 NOTE — ED Provider Notes (Signed)
Patient care taken over at shift change.  Patient plan was admitted for UTI, urine is notable for leukocytes and greater than 20 white blood cells.  Given she has been on antibiotics culture has been obtained and is pending.  Will start on Rocephin.  Patient is also been having hypertension, no signs of new AKI and creatinine is roughly at baseline.  Will give 10 hydralazine as recommended by admitting physician.  I spoke with Dr. Berna Spare with hospital service who graciously agrees with admission.   Sherrill Raring, PA-C 01/11/22 1549    Long, Wonda Olds, MD 01/11/22 [phone]

## 2022-01-11 NOTE — H&P (Signed)
History and Physical    Patient: Tiffany [name] ZSW:[id number] DOB: 27-Dec-1926 DOA: 01/11/2022 DOS: the patient was seen and examined on 01/11/2022 PCP: Donald Prose, MD  Patient coming from: Home  Chief Complaint:  Chief Complaint  Patient presents with   Recurrent UTI   HPI: Tiffany [name] is a 88 y.o. female with medical history significant of AKI, atypical mole, basal cell carcinoma, breast cancer, colon polyps, hypertension, hypothyroidism, osteoporosis, rectal bleeding, rhabdomyolysis, history of altered nonhemorrhagic CVA, history of multiple UTIs, dementia who is brought to the emergency department by her son due to worsening dementia over the last few days.  He stated this happens when she gets a UTI.  She has also been very weak and has had a few falls over the last month and a half.  She is unable to provide further history, but is able to answer simple questions.  She denied headache, abdominal, back, chest, joint or muscle pain at this time.  ED course: Initial vital signs were temperature 98.1 F, pulse 51, respirations 16, BP 209/56 mmHg O2 sat 97% on room air.  The patient received ceftriaxone 1 g IVPB and hydralazine 10 mg IVP.  Lab work: Urinalysis shows small leukocyte esterase with 21-50 WBC with budding yeast.  CBC with a white count of 4.9, hemoglobin 11.7 g/dL with an MCV 102.4 fL and platelets 140.  CMP showed normal electrolytes.  Glucose 138, BUN 25 and creatinine 1.17 mg/dL.  Total protein 5.9 albumin 3.3 g/dL.  The rest of the LFTs were unremarkable.  Lactic acid was normal twice.  Coronavirus influenza PCR were negative.   Review of Systems: As mentioned in the history of present illness. All other systems reviewed and are negative.  Past Medical History:  Diagnosis Date   AKI (acute kidney injury) ([place]) 10/02/2020   Arthritis    Atypical mole 09/23/2002   Right lower cheek (slight) (widershave)   Atypical mole 09/23/2002   Left Lower Cheek (slight to moderate)  (widershave and excision)   Atypical mole 12/19/2008   Left Cheek (atypical nevi) (excision)   BCC (basal cell carcinoma of skin) 03/18/1995   Right Abdomen   BCC (basal cell carcinoma of skin) 06/12/1999   Upper Right Chest (curet and 5FU)   Breast cancer (HCC)    Colon polyps    Hypertension    Hypothyroidism    Osteoporosis    Rectal bleeding    Rhabdomyolysis 12/21/2017   SCCA (squamous cell carcinoma) of skin 03/06/2004   Left Shin (curet and 5FU)   SCCA (squamous cell carcinoma) of skin 10/07/2006   Left Hand (in situ) (curet and 5FU)   Skin cancer    Stroke ([place])    Superficial basal cell carcinoma (BCC) 05/04/2007   Left Shin (curet and 5FU)   UTI (urinary tract infection) 10/02/2020   Past Surgical History:  Procedure Laterality Date   ABDOMINAL HYSTERECTOMY     BILATERAL TOTAL MASTECTOMY WITH AXILLARY LYMPH NODE DISSECTION     CATARACT EXTRACTION     CHOLECYSTECTOMY     COLONOSCOPY N/A 10/07/2012   Procedure: COLONOSCOPY;  Surgeon: Arta Silence, MD;  Location[facility] ENDOSCOPY;  Service: Endoscopy;  Laterality: N/A;   hip replacment     HIP SURGERY     JOINT REPLACEMENT     MASTECTOMY     REPLACEMENT TOTAL KNEE     shin repair     Social History:  reports that she has never smoked. She has never used smokeless tobacco. She reports  that she does not drink alcohol and does not use drugs.  Allergies  Allergen Reactions   Codeine Nausea Only   Morphine And Related Anxiety and Other (See Comments)    Feel very unwell and out there   Sulfa Antibiotics Hives and Itching    Family History  Problem Relation Age of Onset   Glaucoma Mother    Coronary artery disease Father    Diabetes Sister    Diabetes Brother     Prior to Admission medications   Medication Sig Start Date End Date Taking? Authorizing Provider  acetaminophen (TYLENOL) 500 MG tablet Take 1,000 mg by mouth in the morning and at bedtime.    [provider]  furosemide (LASIX) 40 MG tablet  Take 1 tablet (40 mg total) by mouth daily. Patient taking differently: Take 80 mg by mouth as needed for edema. 10/05/20   Sheikh, Omair Latif, DO  irbesartan (AVAPRO) 300 MG tablet Take 300 mg by mouth every morning.    [provider]  levothyroxine (SYNTHROID) 75 MCG tablet Take 1 tablet (75 mcg total) by mouth daily at 6 (six) AM. 11/27/21   Gherghe, Vella Redhead, MD  loperamide (IMODIUM A-D) 2 MG tablet Take 1 mg by mouth daily as needed for diarrhea or loose stools.    [provider]  metoprolol (LOPRESSOR) 50 MG tablet Take 50 mg by mouth 2 (two) times daily.    [provider]    Physical Exam: Vitals:   01/11/22 1445 01/11/22 1631 01/11/22 1655 01/11/22 1728  BP: (!) 204/66  (!) 188/68 (!) 191/52  Pulse: (!) 56   (!) 53  Resp: 14   16  Temp:  97.7 F (36.5 C)  98 F (36.7 C)  TempSrc:  Oral  Oral  SpO2: 98%   98%   Physical Exam Vitals and nursing note reviewed.  Constitutional:      General: She is awake. She is not in acute distress.    Appearance: Normal appearance. She is ill-appearing.  HENT:     Head: Normocephalic.     Mouth/Throat:     Mouth: Mucous membranes are moist.  Eyes:     General: No scleral icterus.    Pupils: Pupils are equal, round, and reactive to light.  Neck:     Vascular: No JVD.  Cardiovascular:     Rate and Rhythm: Normal rate and regular rhythm.     Heart sounds: S1 normal and S2 normal.  Pulmonary:     Effort: Pulmonary effort is normal.     Breath sounds: Normal breath sounds. No wheezing, rhonchi or rales.  Abdominal:     General: Bowel sounds are normal. There is no distension.     Palpations: Abdomen is soft.     Tenderness: There is no right CVA tenderness.  Musculoskeletal:     Cervical back: Neck supple.     Right lower leg: No edema.     Left lower leg: No edema.  Skin:    General: Skin is warm and dry.  Neurological:     General: No focal deficit present.     Mental Status: She is alert. She is  disoriented.  Psychiatric:        Mood and Affect: Mood normal.        Behavior: Behavior is cooperative.   Data Reviewed:  There are no new results to review at this time.  Assessment and Plan: Principal Problem:   Acute metabolic encephalopathy In the setting  of:   Acute UTI (urinary tract infection) Admit to telemetry/inpatient. Continue IV fluids. Continue ceftriaxone 1 g daily. Follow-up urine culture and sensitivity Follow CBC and CMP in a.m.  Active Problems:   Hypertensive urgency Continue Avapro 300 mg p.o. daily. Hold metoprolol due to bradycardia. Begin amlodipine 2.5 mg p.o. daily. Will need lower dose of metoprolol.    Sinus bradycardia Hold metoprolol. Decrease to a lower dose when resuming.    CKD (chronic kidney disease) stage 3b, GFR 30-59 ml/min (HCC) Monitor renal function electrolytes. Hypertension control.    Hypothyroidism Continue levothyroxine 75 mcg p.o. daily.    Pressure injury of skin POA (Stage I) Continue local care.    Frequent falls Consult physical therapy.   Advance Care Planning:   Code Status: DNR   Consults:   Family Communication: Her son was at bedside.  Severity of Illness: The appropriate patient status for this patient is OBSERVATION. Observation status is judged to be reasonable and necessary in order to provide the required intensity of service to ensure the patient's safety. The patient's presenting symptoms, physical exam findings, and initial radiographic and laboratory data in the context of their medical condition is felt to place them at decreased risk for further clinical deterioration. Furthermore, it is anticipated that the patient will be medically stable for discharge from the hospital within 2 midnights of admission.   Author: Reubin Milan, MD 01/11/2022 6:14 PM  For on call review [email or web address].   This document was prepared using Dragon voice recognition software and may contain some unintended  transcription errors.

## 2022-01-11 NOTE — ED Provider Notes (Signed)
I provided a substantive portion of the care of this patient.  I personally performed the entirety of the medical decision making for this encounter.      This is a 88 year old patient with history dementia who presents with inability to care for self in her facility.  Patient also is being treated for a UTI.  Plan will be for patient to have blood work and urinalysis.  If hernia remains infected she will need to be admitted for failed outpatient treatment.  If not then she will need to be placed into a new facility   Lacretia Leigh, MD 01/11/22 1402

## 2022-01-12 DIAGNOSIS — Z20822 Contact with and (suspected) exposure to covid-19: Secondary | ICD-10-CM | POA: Diagnosis not present

## 2022-01-12 DIAGNOSIS — N1832 Chronic kidney disease, stage 3b: Secondary | ICD-10-CM | POA: Diagnosis not present

## 2022-01-12 DIAGNOSIS — N390 Urinary tract infection, site not specified: Secondary | ICD-10-CM | POA: Diagnosis not present

## 2022-01-12 DIAGNOSIS — R531 Weakness: Secondary | ICD-10-CM | POA: Diagnosis not present

## 2022-01-12 DIAGNOSIS — Z882 Allergy status to sulfonamides status: Secondary | ICD-10-CM | POA: Diagnosis not present

## 2022-01-12 DIAGNOSIS — M6281 Muscle weakness (generalized): Secondary | ICD-10-CM | POA: Diagnosis not present

## 2022-01-12 DIAGNOSIS — G309 Alzheimer's disease, unspecified: Secondary | ICD-10-CM | POA: Diagnosis not present

## 2022-01-12 DIAGNOSIS — R488 Other symbolic dysfunctions: Secondary | ICD-10-CM | POA: Diagnosis not present

## 2022-01-12 DIAGNOSIS — I129 Hypertensive chronic kidney disease with stage 1 through stage 4 chronic kidney disease, or unspecified chronic kidney disease: Secondary | ICD-10-CM | POA: Diagnosis not present

## 2022-01-12 DIAGNOSIS — R1312 Dysphagia, oropharyngeal phase: Secondary | ICD-10-CM | POA: Diagnosis not present

## 2022-01-12 DIAGNOSIS — R296 Repeated falls: Secondary | ICD-10-CM | POA: Diagnosis not present

## 2022-01-12 DIAGNOSIS — Z7401 Bed confinement status: Secondary | ICD-10-CM | POA: Diagnosis not present

## 2022-01-12 DIAGNOSIS — Z833 Family history of diabetes mellitus: Secondary | ICD-10-CM | POA: Diagnosis not present

## 2022-01-12 DIAGNOSIS — G301 Alzheimer's disease with late onset: Secondary | ICD-10-CM | POA: Diagnosis not present

## 2022-01-12 DIAGNOSIS — G9341 Metabolic encephalopathy: Secondary | ICD-10-CM | POA: Diagnosis not present

## 2022-01-12 DIAGNOSIS — Z0389 Encounter for observation for other suspected diseases and conditions ruled out: Secondary | ICD-10-CM | POA: Diagnosis not present

## 2022-01-12 DIAGNOSIS — Z515 Encounter for palliative care: Secondary | ICD-10-CM | POA: Diagnosis not present

## 2022-01-12 DIAGNOSIS — Z885 Allergy status to narcotic agent status: Secondary | ICD-10-CM | POA: Diagnosis not present

## 2022-01-12 DIAGNOSIS — R4182 Altered mental status, unspecified: Secondary | ICD-10-CM | POA: Diagnosis not present

## 2022-01-12 DIAGNOSIS — R627 Adult failure to thrive: Secondary | ICD-10-CM | POA: Diagnosis not present

## 2022-01-12 DIAGNOSIS — E43 Unspecified severe protein-calorie malnutrition: Secondary | ICD-10-CM | POA: Diagnosis not present

## 2022-01-12 DIAGNOSIS — I10 Essential (primary) hypertension: Secondary | ICD-10-CM | POA: Diagnosis not present

## 2022-01-12 DIAGNOSIS — Z7189 Other specified counseling: Secondary | ICD-10-CM | POA: Diagnosis not present

## 2022-01-12 DIAGNOSIS — Z66 Do not resuscitate: Secondary | ICD-10-CM | POA: Diagnosis not present

## 2022-01-12 DIAGNOSIS — R0602 Shortness of breath: Secondary | ICD-10-CM | POA: Diagnosis not present

## 2022-01-12 DIAGNOSIS — Z79899 Other long term (current) drug therapy: Secondary | ICD-10-CM | POA: Diagnosis not present

## 2022-01-12 DIAGNOSIS — Z96659 Presence of unspecified artificial knee joint: Secondary | ICD-10-CM | POA: Diagnosis not present

## 2022-01-12 DIAGNOSIS — R001 Bradycardia, unspecified: Secondary | ICD-10-CM | POA: Diagnosis not present

## 2022-01-12 DIAGNOSIS — M810 Age-related osteoporosis without current pathological fracture: Secondary | ICD-10-CM | POA: Diagnosis not present

## 2022-01-12 DIAGNOSIS — R69 Illness, unspecified: Secondary | ICD-10-CM | POA: Diagnosis not present

## 2022-01-12 DIAGNOSIS — R609 Edema, unspecified: Secondary | ICD-10-CM | POA: Diagnosis not present

## 2022-01-12 DIAGNOSIS — Z79890 Hormone replacement therapy (postmenopausal): Secondary | ICD-10-CM | POA: Diagnosis not present

## 2022-01-12 DIAGNOSIS — F0282 Dementia in other diseases classified elsewhere, unspecified severity, with psychotic disturbance: Secondary | ICD-10-CM | POA: Diagnosis present

## 2022-01-12 DIAGNOSIS — Z83511 Family history of glaucoma: Secondary | ICD-10-CM | POA: Diagnosis not present

## 2022-01-12 DIAGNOSIS — R8290 Unspecified abnormal findings in urine: Secondary | ICD-10-CM | POA: Diagnosis not present

## 2022-01-12 DIAGNOSIS — E860 Dehydration: Secondary | ICD-10-CM | POA: Diagnosis not present

## 2022-01-12 DIAGNOSIS — Z87440 Personal history of urinary (tract) infections: Secondary | ICD-10-CM | POA: Diagnosis not present

## 2022-01-12 DIAGNOSIS — L89151 Pressure ulcer of sacral region, stage 1: Secondary | ICD-10-CM | POA: Diagnosis not present

## 2022-01-12 DIAGNOSIS — E039 Hypothyroidism, unspecified: Secondary | ICD-10-CM | POA: Diagnosis not present

## 2022-01-12 DIAGNOSIS — I160 Hypertensive urgency: Secondary | ICD-10-CM | POA: Diagnosis not present

## 2022-01-12 LAB — TSH: TSH: 1.077 u[IU]/mL (ref 0.350–4.500)

## 2022-01-12 MED ORDER — LEVOTHYROXINE SODIUM 50 MCG PO TABS
75.0000 ug | ORAL_TABLET | Freq: Every day | ORAL | Status: DC
  Administered 2022-01-12 – 2022-01-21 (×10): 75 ug via ORAL
  Filled 2022-01-12 (×10): qty 1

## 2022-01-12 MED ORDER — IRBESARTAN 300 MG PO TABS
300.0000 mg | ORAL_TABLET | Freq: Every morning | ORAL | Status: DC
  Administered 2022-01-12 – 2022-01-21 (×9): 300 mg via ORAL
  Filled 2022-01-12 (×10): qty 1

## 2022-01-12 NOTE — Evaluation (Signed)
Physical Therapy Evaluation Patient Details Name: Tiffany [name] MRN: [id number] DOB: 1927-02-19 Today's Date: 01/12/2022  History of Present Illness  88 y.o. female with medical history significant of AKI, atypical mole, basal cell carcinoma, breast cancer, colon polyps, hypertension, hypothyroidism, osteoporosis, rectal bleeding, rhabdomyolysis, history of altered nonhemorrhagic CVA, history of multiple UTIs, dementia who is brought to the emergency department by her son due to worsening dementia over the last few days.  He stated this happens when she gets a UTI.  She has also been very weak and has had a few falls over the last month and a half.  Clinical Impression  Pt admitted with above diagnosis. Mod assist for supine to sit, min assist to stand and take a few pivotal steps to recliner with RW, pt did not feel she could ambulate 2* fatigue. No family present to provide prior level of function information. At present she needs 24/7 assistance.  Pt currently with functional limitations due to the deficits listed below (see PT Problem List). Pt will benefit from skilled PT to increase their independence and safety with mobility to allow discharge to the venue listed below.          Recommendations for follow up therapy are one component of a multi-disciplinary discharge planning process, led by the attending physician.  Recommendations may be updated based on patient status, additional functional criteria and insurance authorization.  Follow Up Recommendations Skilled nursing-short term rehab (<3 hours/day) Can patient physically be transported by private vehicle: Yes    Assistance Recommended at Discharge Intermittent Supervision/Assistance  Patient can return home with the following  A little help with walking and/or transfers;A little help with bathing/dressing/bathroom;Assistance with cooking/housework;Direct supervision/assist for medications management;Assist for transportation;Help  with stairs or ramp for entrance    Equipment Recommendations None recommended by PT  Recommendations for Other Services       Functional Status Assessment Patient has had a recent decline in their functional status and demonstrates the ability to make significant improvements in function in a reasonable and predictable amount of time.     Precautions / Restrictions Precautions Precautions: Fall Restrictions Weight Bearing Restrictions: No      Mobility  Bed Mobility Overal bed mobility: Needs Assistance Bed Mobility: Supine to Sit     Supine to sit: Mod assist     General bed mobility comments: assist to raise trunk and to pivot hips to edge of bed    Transfers Overall transfer level: Needs assistance Equipment used: Rolling walker (2 wheels) Transfers: Sit to/from Stand, Bed to chair/wheelchair/BSC Sit to Stand: Min assist, From elevated surface   Step pivot transfers: Min assist       General transfer comment: assist to rise and steady    Ambulation/Gait               General Gait Details: deferred, pt stated she was fatigued after stand pivot to recliner  Stairs            Wheelchair Mobility    Modified Rankin (Stroke Patients Only)       Balance Overall balance assessment: Needs assistance, History of Falls Sitting-balance support: Feet supported, No upper extremity supported Sitting balance-Leahy Scale: Poor Sitting balance - Comments: posterior lean in sitting requiring min A to correct Postural control: Posterior lean Standing balance support: Reliant on assistive device for balance, Bilateral upper extremity supported Standing balance-Leahy Scale: Poor Standing balance comment: relies on BUE support  Pertinent Vitals/Pain Pain Assessment Pain Assessment: Faces Faces Pain Scale: Hurts little more Pain Location: back Pain Descriptors / Indicators: Aching, Grimacing Pain Intervention(s):  Monitored during session, Limited activity within patient's tolerance, Repositioned, Patient requesting pain meds-RN notified    Home Living Family/patient expects to be discharged to:: Unsure                        Prior Function Prior Level of Function : Patient poor historian/Family not available             Mobility Comments: per PT note 11/22/21, pt was at an ILF, Pt reports she walks with a RW. No family present.       Hand Dominance        Extremity/Trunk Assessment   Upper Extremity Assessment Upper Extremity Assessment: Defer to OT evaluation    Lower Extremity Assessment Lower Extremity Assessment: Overall WFL for tasks assessed    Cervical / Trunk Assessment Cervical / Trunk Assessment: Kyphotic  Communication   Communication: No difficulties  Cognition Arousal/Alertness: Awake/alert Behavior During Therapy: WFL for tasks assessed/performed Overall Cognitive Status: No family/caregiver present to determine baseline cognitive functioning                                 General Comments: pt oriented to self but not to situation/location, able to follow commands, is pleasantly confused        General Comments      Exercises     Assessment/Plan    PT Assessment Patient needs continued PT services  PT Problem List Decreased activity tolerance;Decreased balance;Pain;Decreased cognition       PT Treatment Interventions Gait training;Therapeutic activities;Therapeutic exercise;Functional mobility training;Balance training;Patient/family education    PT Goals (Current goals can be found in the Care Plan section)  Acute Rehab PT Goals PT Goal Formulation: Patient unable to participate in goal setting Time For Goal Achievement: 01/26/22 Potential to Achieve Goals: Fair    Frequency Min 2X/week     Co-evaluation               AM-PAC PT "6 Clicks" Mobility  Outcome Measure Help needed turning from your back to your  side while in a flat bed without using bedrails?: A Little Help needed moving from lying on your back to sitting on the side of a flat bed without using bedrails?: A Lot Help needed moving to and from a bed to a chair (including a wheelchair)?: A Little Help needed standing up from a chair using your arms (e.g., wheelchair or bedside chair)?: A Little Help needed to walk in hospital room?: A Lot Help needed climbing 3-5 steps with a railing? : Total 6 Click Score: 14    End of Session Equipment Utilized During Treatment: Gait belt Activity Tolerance: Patient tolerated treatment well;No increased pain Patient left: in chair;with call bell/phone within reach;with nursing/sitter in room (no chair alarm pads available on floor, RN notified) Nurse Communication: Mobility status;Patient requests pain meds PT Visit Diagnosis: Difficulty in walking, not elsewhere classified (R26.2)    Time: 3532-9924 PT Time Calculation (min) (ACUTE ONLY): 14 min   Charges:   PT Evaluation $PT Eval Moderate Complexity: 1 Mod          Philomena Doheny PT 01/12/2022  Acute Rehabilitation Services  Office [phone]

## 2022-01-12 NOTE — Evaluation (Signed)
Occupational Therapy Evaluation Patient Details Name: Tiffany [name] MRN: [id number] DOB: [birth year] Today's Date: 01/12/2022   History of Present Illness 88 y.o. female with medical history significant of AKI, atypical mole, basal cell carcinoma, breast cancer, colon polyps, hypertension, hypothyroidism, osteoporosis, rectal bleeding, rhabdomyolysis, history of altered nonhemorrhagic CVA, history of multiple UTIs, dementia who is brought to the emergency department by her son due to worsening dementia over the last few days.  He stated this happens when she gets a UTI.  She has also been very weak and has had a few falls over the last month and a half.   Clinical Impression   [name] is a 88 year old woman who presents with generalized weakness, decreased activity tolerance, impaired balance and increased confusion. At baseline she lives in an ILF with PCA for 2-3 hours a day. On evaluation she needed min assist to ambulate to bathroom with walker, max assist for toileting and mod-max assist for LB ADLs. Patient alert to self but not aware of why she is in the hospital. She reports some falls recently. Patient's buttocks a deep red with a sore. Therapist had patient lie on her right side to offload buttocks once back in bed. RN notified. Patient will benefit from skilled OT services while in hospital to improve deficits and learn compensatory strategies as needed in order to return to PLOF.       Recommendations for follow up therapy are one component of a multi-disciplinary discharge planning process, led by the attending physician.  Recommendations may be updated based on patient status, additional functional criteria and insurance authorization.   Follow Up Recommendations  Skilled nursing-short term rehab (<3 hours/day)    Assistance Recommended at Discharge Frequent or constant Supervision/Assistance  Patient can return home with the following A little help with walking and/or  transfers;A lot of help with bathing/dressing/bathroom;Assistance with cooking/housework;Direct supervision/assist for financial management;Help with stairs or ramp for entrance;Direct supervision/assist for medications management;Assist for transportation    Functional Status Assessment  Patient has had a recent decline in their functional status and/or demonstrates limited ability to make significant improvements in function in a reasonable and predictable amount of time  Equipment Recommendations  None recommended by OT    Recommendations for Other Services       Precautions / Restrictions Precautions Precautions: Fall Restrictions Weight Bearing Restrictions: No      Mobility Bed Mobility Overal bed mobility: Needs Assistance Bed Mobility: Sit to Supine     Supine to sit: Supervision     General bed mobility comments: Increased time to transfer back into bed.    Transfers Overall transfer level: Needs assistance Equipment used: Rolling walker (2 wheels) Transfers: Sit to/from Stand Sit to Stand: Min guard           General transfer comment: Min guard to stand with increased time. Min assist to ambulate safely with walker to bathroom and then back to bed.      Balance Overall balance assessment: Needs assistance Sitting-balance support: Feet supported, No upper extremity supported Sitting balance-Leahy Scale: Good     Standing balance support: During functional activity, Reliant on assistive device for balance Standing balance-Leahy Scale: Poor                             ADL either performed or assessed with clinical judgement   ADL Overall ADL's : Needs assistance/impaired Eating/Feeding: Set up;Sitting;Supervision/ safety   Grooming: Set  up;Sitting;Supervision/safety   Upper Body Bathing: Set up;Supervision/ safety;Sitting   Lower Body Bathing: Moderate assistance;Sit to/from stand   Upper Body Dressing : Set up;Sitting;Minimal  assistance   Lower Body Dressing: Maximal assistance;Sit to/from stand   Toilet Transfer: Minimal assistance;Rolling walker (2 wheels);Grab Information systems manager Details (indicate cue type and reason): verbal cues for safety Toileting- Clothing Manipulation and Hygiene: Maximal assistance;Sit to/from stand Toileting - Clothing Manipulation Details (indicate cue type and reason): able to wipe self in seated position but needed assistance for clothing management and perianal care     Functional mobility during ADLs: Minimal assistance;Rolling walker (2 wheels) General ADL Comments: Ambulated to bathroom with walker and min assist. Altered gait. Kyphotic posture and valgus left knee.     Vision   Vision Assessment?: No apparent visual deficits     Perception     Praxis      Pertinent Vitals/Pain Pain Assessment Pain Assessment: No/denies pain     Hand Dominance Right   Extremity/Trunk Assessment Upper Extremity Assessment Upper Extremity Assessment: Overall WFL for tasks assessed   Lower Extremity Assessment Lower Extremity Assessment: Defer to PT evaluation   Cervical / Trunk Assessment Cervical / Trunk Assessment: Kyphotic   Communication Communication Communication: No difficulties   Cognition Arousal/Alertness: Awake/alert Behavior During Therapy: WFL for tasks assessed/performed Overall Cognitive Status: History of cognitive impairments - at baseline                                 General Comments: Oriented to self. knows she is in the hospital. Not oriented to situation. Was able to state that she had fallen recently. Able to follow commands.     General Comments       Exercises     Shoulder Instructions      Home Living Family/patient expects to be discharged to:: Skilled nursing facility Living Arrangements: Alone                               Additional Comments: has someone 3 hours per day, unsure of care  provided      Prior Functioning/Environment Prior Level of Function : Patient poor historian/Family not available             Mobility Comments: uses a walker for ambulation. Per palliative note has been increasingly sedentary but two weeks ago walked to the facility's lobby. ADLs Comments: has a private aide that comes to assist with ADLs/IADLs for 2-3 hours a day.        OT Problem List: Decreased strength;Decreased activity tolerance;Impaired balance (sitting and/or standing);Decreased safety awareness;Decreased cognition;Decreased knowledge of use of DME or AE;Pain      OT Treatment/Interventions: Self-care/ADL training;Therapeutic exercise;DME and/or AE instruction;Therapeutic activities;Balance training;Patient/family education;Cognitive remediation/compensation    OT Goals(Current goals can be found in the care plan section) Acute Rehab OT Goals OT Goal Formulation: Patient unable to participate in goal setting Time For Goal Achievement: 01/26/22 Potential to Achieve Goals: Fair  OT Frequency: Min 2X/week    Co-evaluation              AM-PAC OT "6 Clicks" Daily Activity     Outcome Measure Help from another person eating meals?: A Little Help from another person taking care of personal grooming?: A Little Help from another person toileting, which includes using toliet, bedpan, or urinal?: A Lot Help from another  person bathing (including washing, rinsing, drying)?: A Little Help from another person to put on and taking off regular upper body clothing?: A Little Help from another person to put on and taking off regular lower body clothing?: A Lot 6 Click Score: 16   End of Session Equipment Utilized During Treatment: Rolling walker (2 wheels);Gait belt Nurse Communication:  (need to offload and postiioned on her right side, sore on buttucks)  Activity Tolerance: Patient tolerated treatment well Patient left: in bed;with call bell/phone within reach;with bed  alarm set;with nursing/sitter in room  OT Visit Diagnosis: Muscle weakness (generalized) (M62.81);Other symptoms and signs involving cognitive function                Time: 8022-3361 OT Time Calculation (min): 13 min Charges:  OT General Charges $OT Visit: 1 Visit OT Evaluation $OT Eval Low Complexity: 1 Low  Gustavo Lah, OTR/L [place]  Office [phone]   Lenward Chancellor 01/12/2022, 2:44 PM

## 2022-01-12 NOTE — Progress Notes (Addendum)
PROGRESS NOTE Tiffany [name]  ENI:[id number] DOB: [birth year] DOA: 01/11/2022 PCP: Donald Prose, MD   Brief Narrative/Hospital Course:  88 y.o.f w/medical history significant of AKI, atypical mole, basal cell carcinoma, breast cancer, colon polyps, hypertension, hypothyroidism, osteoporosis, rectal bleeding, rhabdomyolysis, history of altered nonhemorrhagic CVA, history of multiple UTIs, dementia currently enrolled in [facility] outpatient palliative care who is brought to the ED by her son due to worsening dementia over the last few days. He stated this happens when she gets a UTI.  She has also been very weak and has had a few falls over the last month and a half.  ED course: Vitals stable with sinus bradycardia:Urinalysis shows small leukocyte esterase with 21-50 WBC with budding yeast.  CBC with a white count of 4.9, hemoglobin 11.7 g/dL with an MCV 102.4 fL and platelets 140.  CMP showed normal electrolytes.  Glucose 138, BUN 25 and creatinine 1.17 mg/dL.  Total protein 5.9 albumin 3.3 g/dL.  The rest of the LFTs were unremarkable.  Lactic acid was normal twice.  Coronavirus influenza PCR were negative.  Patient placed on IV ceftriaxone and admitted.    Subjective: Seen and examined this morning she is alert awake oriented to self, she thinks she is in the hospital able to name current president.   Assessment and Plan: Principal Problem:   Acute UTI (urinary tract infection) Active Problems:   Acute metabolic encephalopathy   CKD (chronic kidney disease) stage 3, GFR 30-59 ml/min (HCC)   Hypothyroidism   Pressure injury of skin   Frequent falls   Hypertensive urgency   Sinus bradycardia   Acute metabolic encephalopathy Dementia: Mental status worsening from baseline in the setting of UTI with background dementia.  Continue to address UTI supportive care delirium precaution fall precaution.Obtain PT OT evaluation.  + d dimer 1.6: I nED at 1 pm 9/8: no chest pain or leg swelling-not on  oxygen- ??etiology. Check duplex/VQ. Her creat clearance is not great . Discussed w/ son- hold off on CTA since low suspicion.  UTI: Continue ceftriaxone follow-up culture data  Hypertension urgency BP elevated on admission holding beta-blocker due to bradycardia started on amlodipine 2.5, continue Avapro monitor Sinus bradycardia asymptomatic holding beta-blocker-consider resumption at lower dose CKD stage IIIb stable Hypothyroidism continue Synthroid Frequent fall PT OT eval requested Pressure injury POA-off laoding.wound care Goals of care DNR currently enrolled in outpatient palliative care. Consult palliative care. Significant weight loss female weight loss 7.6 kg since July dietitian consulted  DVT prophylaxis: enoxaparin (LOVENOX) injection 30 mg Start: 01/11/22 2200 Code Status:   Code Status: DNR Family Communication: plan of care discussed with patient at bedside. CALLED SON to udpate no answer. Patient status is: observation, but remains hospitalized for ongoing management of confusion and UTI Level of care: Telemetry  Dispo: The patient is from: home            Anticipated disposition: SNF per OTOT  Mobility Assessment (last 72 hours)     Mobility Assessment     Row Name 01/12/22 1010 01/11/22 1700         Does patient have an order for bedrest or is patient medically unstable -- No - Continue assessment      What is the highest level of mobility based on the progressive mobility assessment? Level 3 (Stands with assist) - Balance while standing  and cannot march in place Level 1 (Bedfast) - Unable to balance while sitting on edge of bed      Is the  above level different from baseline mobility prior to current illness? -- Yes - Recommend PT order                Objective: Vitals last 24 hrs: Vitals:   01/11/22 1728 01/11/22 2251 01/12/22 0108 01/12/22 0517  BP: (!) 191/52 (!) 132/59 (!) 117/55 (!) 127/51  Pulse: (!) 53 61 (!) 58 (!) 58  Resp: '16 18 16 18   '$ Temp: 98 F (36.7 C) 98.2 F (36.8 C) 98.2 F (36.8 C) 98.4 F (36.9 C)  TempSrc: Oral Oral Oral Oral  SpO2: 98% 95% 95% 94%   Weight change:   Physical Examination: General exam: alert awake,older than stated age, weak appearing. HEENT:Oral mucosa moist, Ear/Nose WNL grossly, dentition normal. Respiratory system: bilaterally diminished BS, no use of accessory muscle Cardiovascular system: S1 & S2 +, No JVD. Gastrointestinal system: Abdomen soft,NT,ND, BS+ Nervous System:Alert, awake, moving extremities and grossly nonfocal Extremities: LE edema neg,distal peripheral pulses palpable.  Skin: No rashes,no icterus. MSK: Normal muscle bulk,tone, power  Medications reviewed:  Scheduled Meds:  amLODipine  2.5 mg Oral Daily   vitamin C  500 mg Oral Daily   enoxaparin (LOVENOX) injection  30 mg Subcutaneous Q24H   irbesartan  300 mg Oral q morning   levothyroxine  75 mcg Oral Q0600   nystatin   Topical TID   zinc sulfate  220 mg Oral Daily   Continuous Infusions:  cefTRIAXone (ROCEPHIN)  IV      Pressure Injury 12/22/17 Deep Tissue Injury - Purple or maroon localized area of discolored intact skin or blood-filled blister due to damage of underlying soft tissue from pressure and/or shear. (Active)  12/22/17 1850  Location: Buttocks  Location Orientation:   Staging: Deep Tissue Injury - Purple or maroon localized area of discolored intact skin or blood-filled blister due to damage of underlying soft tissue from pressure and/or shear.  Wound Description (Comments):   Present on Admission: Yes   Diet Order             Diet Heart Room service appropriate? Yes; Fluid consistency: Thin  Diet effective now                            Intake/Output Summary (Last 24 hours) at 01/12/2022 1122 Last data filed at 01/12/2022 1106 Gross per 24 hour  Intake 893 ml  Output 650 ml  Net 243 ml   Net IO Since Admission: 243 mL [01/12/22 1122]  Wt Readings from Last 3 Encounters:   11/21/21 66.7 kg  10/02/20 67.1 kg  02/02/19 59 kg    Find Unresulted Labs (From admission, onward)     Start     Ordered   01/18/22 0500  Creatinine, serum  (enoxaparin (LOVENOX)    CrCl >/= 30 ml/min)  Weekly,   R     Comments: while on enoxaparin therapy    01/11/22 1602   01/11/22 1409  Urine Culture  (Urine Culture)  Once,   URGENT       Question:  Indication  Answer:  Dysuria   01/11/22 1408          Data Reviewed: I have personally reviewed following labs and imaging studies CBC: Recent Labs  Lab 01/11/22 1338  WBC 4.9  NEUTROABS 3.3  HGB 11.7*  HCT 37.8  MCV 102.4*  PLT 676*   Basic Metabolic Panel: Recent Labs  Lab 01/11/22 1338  NA 139  K 4.2  CL 110  CO2 25  GLUCOSE 138*  BUN 25*  CREATININE 1.17*  CALCIUM 9.2   GFR: CrCl cannot be calculated (Unknown ideal weight.). Liver Function Tests: Recent Labs  Lab 01/11/22 1338  AST 14*  ALT 9  ALKPHOS 53  BILITOT 0.7  PROT 5.9*  ALBUMIN 3.3*   Thyroid Function Tests: Recent Labs    01/12/22 0803  TSH 1.077   Sepsis Labs: Recent Labs  Lab 01/11/22 1338 01/11/22 1607  LATICACIDVEN 1.0 1.1    Antimicrobials: Anti-infectives (From admission, onward)    Start     Dose/Rate Route Frequency Ordered Stop   01/12/22 1600  cefTRIAXone (ROCEPHIN) 1 g in sodium chloride 0.9 % 100 mL IVPB        1 g 200 mL/hr over 30 Minutes Intravenous Every 24 hours 01/11/22 1815     01/11/22 2200  molnupiravir EUA (LAGEVRIO) capsule 800 mg  Status:  Discontinued        4 capsule Oral 2 times daily 01/11/22 1615 01/11/22 1617   01/11/22 1800  nirmatrelvir/ritonavir EUA (renal dosing) (PAXLOVID) 2 tablet  Status:  Discontinued        2 tablet Oral 2 times daily 01/11/22 1617 01/11/22 1735   01/11/22 1600  cefTRIAXone (ROCEPHIN) 1 g in sodium chloride 0.9 % 100 mL IVPB        1 g 200 mL/hr over 30 Minutes Intravenous  Once 01/11/22 1546 01/11/22 1635      Culture/Microbiology    Component Value  Date/Time   SDES  11/21/2021 1928    URINE, CLEAN CATCH Performed at [facility], [place] [address], [place], [place] [address]    [place]  11/21/2021 1928    NONE Performed at [facility], [place] [address], [place], [place] [address]    CULT >=100,000 COLONIES/mL ESCHERICHIA COLI (A) 11/21/2021 1928   REPTSTATUS 11/25/2021 FINAL 11/21/2021 1928    Other culture-see note  Radiology Studies: No results found.   LOS: 0 days   Antonieta Pert, MD [facility]  01/12/2022, 11:22 AM

## 2022-01-12 NOTE — Hospital Course (Addendum)
88 y.o.f w/medical history significant of AKI, atypical mole, basal cell carcinoma, breast cancer, colon polyps, hypertension, hypothyroidism, osteoporosis, rectal bleeding, rhabdomyolysis, history of altered nonhemorrhagic CVA, history of multiple UTIs, dementia currently enrolled in [facility] outpatient palliative care who is brought to the ED by her son due to worsening dementia over the last few days. He stated this happens when she gets a UTI.  She has also been very weak and has had a few falls over the last month and a half.  ED course: Vitals stable with sinus bradycardia:Urinalysis shows small leukocyte esterase with 21-50 WBC with budding yeast.  CBC with a white count of 4.9, hemoglobin 11.7 g/dL with an MCV 102.4 fL and platelets 140.  CMP showed normal electrolytes.  Glucose 138, BUN 25 and creatinine 1.17 mg/dL.  Total protein 5.9 albumin 3.3 g/dL.  The rest of the LFTs were unremarkable.  Lactic acid was normal twice.  Coronavirus influenza PCR were negative.  Patient placed on IV ceftriaxone and admitted. Patient being managed with IV antibiotics.  Overall alert awake.  Due to elevated D-dimer ordered VQ scan and duplex.  Seen by PT OT and has advised skilled nursing facility and son wants to pursue. PMT has been consulted.  Plan is for skilled nursing facility once bed available.  Urine culture came back negative, seen by palliative care MOST form filled out.  Duplex of the leg negative for DVT.  Once VQ scan negative she can be discharged

## 2022-01-13 ENCOUNTER — Inpatient Hospital Stay (HOSPITAL_COMMUNITY): Payer: Medicare HMO

## 2022-01-13 DIAGNOSIS — N390 Urinary tract infection, site not specified: Secondary | ICD-10-CM | POA: Diagnosis not present

## 2022-01-13 DIAGNOSIS — Z7189 Other specified counseling: Secondary | ICD-10-CM

## 2022-01-13 DIAGNOSIS — R609 Edema, unspecified: Secondary | ICD-10-CM

## 2022-01-13 DIAGNOSIS — Z515 Encounter for palliative care: Secondary | ICD-10-CM

## 2022-01-13 LAB — URINE CULTURE: Culture: NO GROWTH

## 2022-01-13 NOTE — Progress Notes (Signed)
Bilateral lower extremity venous duplex completed. Refer to "CV Proc" under chart review to view preliminary results.  01/13/2022 2:42 PM Kelby Aline., MHA, RVT, RDCS, RDMS

## 2022-01-13 NOTE — Consult Note (Signed)
Consultation Note Date: 01/13/2022   Patient Name: Tiffany [name]  DOB: [birth year]  MRN: [id number]  Age / Sex: 88 y.o., female  PCP: Donald Prose, MD Referring Physician: Antonieta Pert, MD  Reason for Consultation: Establishing goals of care  HPI/Patient Profile: 88 y.o. female   admitted on 01/11/2022    Clinical Assessment and Goals of Care: 88 year old lady from independent living facility, history of basal cell cancer, breast cancer, colon polyps, hypertension osteoporosis nonhemorrhagic CVA multiple urinary tract infections and dementia.  Currently enrolled in outpatient palliative care at her independent living, brought to the emergency department because of confusion, admitted to hospital medicine service for UTI and dementia.  Palliative consult for ongoing goals of care discussions has been requested. Patient is awake alert sitting up in a chair.  She states that she lives alone, she is asking for her son.  She says that she is hospitalized because of bladder infection.  Her oral intake is reasonable.  Medication history reviewed.  Call placed and discussed with son. Palliative medicine is specialized medical care for people living with serious illness. It focuses on providing relief from the symptoms and stress of a serious illness. The goal is to improve quality of life for both the patient and the family. Goals of care: Broad aims of medical therapy in relation to the patient's values and preferences. Our aim is to provide medical care aimed at enabling patients to achieve the goals that matter most to them, given the circumstances of their particular medical situation and their constraints.  Goals wishes and values attempted to be explored, see recommendations below.  HCPOA Son Tiffany [name] [phone]  SUMMARY OF RECOMMENDATIONS   Agree with DO NOT RESUSCITATE Goals of care discussions held with the  patient's son Tiffany [name] [phone].  Patient is already connected with palliative services in the outpatient setting, currently at her independent living.  Plan is for the patient to go to skilled nursing facility for rehabilitation attempt, recommend continuation of palliative services at her SNF rehab as well. Patient has a MOST form outlining a more comfort focused mode of care, advance care planning documents have been reviewed and discussed with son.  Code Status/Advance Care Planning: DNR   Symptom Management:     Palliative Prophylaxis:  Delirium Protocol  Psycho-social/Spiritual:  Desire for further Chaplaincy support:yes Additional Recommendations: Caregiving  Support/Resources  Prognosis:  < 12 months  Discharge Planning: [facility] for rehab with Palliative care service follow-up      Primary Diagnoses: Present on Admission:  Acute UTI (urinary tract infection)  Hypertensive urgency  Hypothyroidism  Sinus bradycardia  CKD (chronic kidney disease) stage 3, GFR 30-59 ml/min (HCC)  Acute metabolic encephalopathy  Pressure injury of skin   I have reviewed the medical record, interviewed the patient and family, and examined the patient. The following aspects are pertinent.  Past Medical History:  Diagnosis Date   AKI (acute kidney injury) ([place]) 10/02/2020   Arthritis    Atypical mole 09/23/2002   Right lower cheek (  slight) Mindi Slicker)   Atypical mole 09/23/2002   Left Lower Cheek (slight to moderate) (widershave and excision)   Atypical mole 12/19/2008   Left Cheek (atypical nevi) (excision)   BCC (basal cell carcinoma of skin) 03/18/1995   Right Abdomen   BCC (basal cell carcinoma of skin) 06/12/1999   Upper Right Chest (curet and 5FU)   Breast cancer (HCC)    Colon polyps    Hypertension    Hypothyroidism    Osteoporosis    Rectal bleeding    Rhabdomyolysis 12/21/2017   SCCA (squamous cell carcinoma) of skin 03/06/2004   Left Shin  (curet and 5FU)   SCCA (squamous cell carcinoma) of skin 10/07/2006   Left Hand (in situ) (curet and 5FU)   Skin cancer    Stroke ([place])    Superficial basal cell carcinoma (BCC) 05/04/2007   Left Shin (curet and 5FU)   UTI (urinary tract infection) 10/02/2020   Social History   Socioeconomic History   Marital status: Widowed    Spouse name: Not on file   Number of children: Not on file   Years of education: Not on file   Highest education level: Not on file  Occupational History   Not on file  Tobacco Use   Smoking status: Never   Smokeless tobacco: Never  Vaping Use   Vaping Use: Not on file  Substance and Sexual Activity   Alcohol use: No   Drug use: No   Sexual activity: Not Currently  Other Topics Concern   Not on file  Social History Narrative   Not on file   Social Determinants of Health   Financial Resource Strain: Not on file  Food Insecurity: Not on file  Transportation Needs: Not on file  Physical Activity: Not on file  Stress: Not on file  Social Connections: Not on file   Family History  Problem Relation Age of Onset   Glaucoma Mother    Coronary artery disease Father    Diabetes Sister    Diabetes Brother    Scheduled Meds:  amLODipine  2.5 mg Oral Daily   vitamin C  500 mg Oral Daily   enoxaparin (LOVENOX) injection  30 mg Subcutaneous Q24H   irbesartan  300 mg Oral q morning   levothyroxine  75 mcg Oral Q0600   nystatin   Topical TID   zinc sulfate  220 mg Oral Daily   Continuous Infusions:  cefTRIAXone (ROCEPHIN)  IV 1 g (01/12/22 1655)   PRN Meds:.acetaminophen **OR** acetaminophen, hydrALAZINE, ondansetron **OR** ondansetron (ZOFRAN) IV Medications Prior to Admission:  Prior to Admission medications   Medication Sig Start Date End Date Taking? Authorizing Provider  acetaminophen (TYLENOL) 500 MG tablet Take 1,000 mg by mouth in the morning and at bedtime.   Yes [provider]  irbesartan (AVAPRO) 300 MG tablet Take 300 mg by  mouth every morning.   Yes [provider]  levothyroxine (SYNTHROID) 75 MCG tablet Take 1 tablet (75 mcg total) by mouth daily at 6 (six) AM. 11/27/21  Yes Gherghe, Vella Redhead, MD  loperamide (IMODIUM A-D) 2 MG tablet Take 2 mg by mouth daily as needed for diarrhea or loose stools.   Yes [provider]  metoprolol (LOPRESSOR) 50 MG tablet Take 50 mg by mouth 2 (two) times daily.   Yes [provider]  furosemide (LASIX) 40 MG tablet Take 1 tablet (40 mg total) by mouth daily. Patient not taking: Reported on 01/11/2022 10/05/20   Kerney Elbe,  DO   Allergies  Allergen Reactions   Tape Other (See Comments)    SKIN TEARS VERY EASILY- B/P CUFF EVEN HURTS   Codeine Nausea Only   Morphine And Related Anxiety and Other (See Comments)    Felt very unwell and "out there"   Sulfa Antibiotics Hives and Itching   Chocolate Diarrhea and Other (See Comments)    Has polyps in the lower intestine = severe diarrhea and bleeding from the rectum   Coffee Bean Extract [Coffea Arabica] Other (See Comments)    Has polyps in the lower intestine = severe diarrhea and bleeding from the rectum   Other Other (See Comments)    NO SEEDS, CORN, and NUTS - Has polyps in the lower intestine = severe diarrhea and bleeding from the rectum   Review of Systems +weakness  Physical Exam Awake alert Sitting in chair Moist oral mucosa Diminished breath sounds towards bases Abdomen is not distended Has mild lower extremity edema Answers a few questions appropriately, has baseline dementia  Vital Signs: BP (!) 148/58 (BP Location: Left Arm)   Pulse 70   Temp 98.2 F (36.8 C) (Oral)   Resp 15   SpO2 95%  Pain Scale: 0-10   Pain Score: 0-No pain   SpO2: SpO2: 95 % O2 Device:SpO2: 95 % O2 Flow Rate: .   IO: Intake/output summary:  Intake/Output Summary (Last 24 hours) at 01/13/2022 1226 Last data filed at 01/13/2022 0900 Gross per 24 hour  Intake 460 ml  Output 250 ml  Net 210 ml     LBM: Last BM Date :  (pta) Baseline Weight:   Most recent weight:       Palliative Assessment/Data:   PPS 40%  Time In:  11.30 Time Out:  12.30 Time Total:  60 Greater than 50%  of this time was spent counseling and coordinating care related to the above assessment and plan.  Signed by: Loistine Chance, MD   Please contact Palliative Medicine Team phone at [phone] for questions and concerns.  For individual provider: See Shea Evans

## 2022-01-13 NOTE — Consult Note (Signed)
[facility] Nurse wound  Wound type:Pressure, Stage 1 to sacrum Pressure Injury POA: Yes. Noted by Dr. Olevia Bowens on admission. Measurement:To be obtained by Bedside RN later today with application of dressing and documented on Nursing Flow Sheet Wound bed:N/A Drainage (amount, consistency, odor) N/A Periwound: intact Dressing procedure/placement/frequency: Pressure injury preventive measures are in place and additional guidance provided for Nursing to include: turning and repositioning to minimize time in the supine position, a pressure redistribution chair pad for OOB use both here and at post discharge, bilateral pressure redistribution heel boots, placement of sacral silicone foam dressing.  [place] nursing team will not follow, but will remain available to this patient, the nursing and medical teams.  Please re-consult if needed.  Thank you for inviting [place] to participate in this patient's Plan of Care.  Maudie Flakes, MSN, RN, CNS, [place], Serita Grammes, [employer], [employer] phone:  [phone]

## 2022-01-13 NOTE — Progress Notes (Signed)
PROGRESS NOTE Lether Tesch  YBO:[id number] DOB: [birth year] DOA: 01/11/2022 PCP: Donald Prose, MD   Brief Narrative/Hospital Course:  88 y.o.f w/medical history significant of AKI, atypical mole, basal cell carcinoma, breast cancer, colon polyps, hypertension, hypothyroidism, osteoporosis, rectal bleeding, rhabdomyolysis, history of altered nonhemorrhagic CVA, history of multiple UTIs, dementia currently enrolled in [place] outpatient palliative care who is brought to the ED by her son due to worsening dementia over the last few days. He stated this happens when she gets a UTI.  She has also been very weak and has had a few falls over the last month and a half.  ED course: Vitals stable with sinus bradycardia:Urinalysis shows small leukocyte esterase with 21-50 WBC with budding yeast.  CBC with a white count of 4.9, hemoglobin 11.7 g/dL with an MCV 102.4 fL and platelets 140.  CMP showed normal electrolytes.  Glucose 138, BUN 25 and creatinine 1.17 mg/dL.  Total protein 5.9 albumin 3.3 g/dL.  The rest of the LFTs were unremarkable.  Lactic acid was normal twice.  Coronavirus influenza PCR were negative.  Patient placed on IV ceftriaxone and admitted. Patient being managed with IV antibiotics.  Overall alert awake.  Due to elevated D-dimer ordered VQ scan and duplex.  Seen by PT OT and has advised skilled nursing facility and son wants to pursue. PMT has been consulted.  Plan is for skilled nursing facility once bed available    Subjective: Seen and examined.  Alert awake pleasant mildly confused with baseline dementia on the bedside chair.   Assessment and Plan: Principal Problem:   Acute UTI (urinary tract infection) Active Problems:   Acute metabolic encephalopathy   CKD (chronic kidney disease) stage 3, GFR 30-59 ml/min (HCC)   Hypothyroidism   Pressure injury of skin   Frequent falls   Hypertensive urgency   Sinus bradycardia   Acute metabolic encephalopathy Dementia: Mental status  worsening from baseline in the setting of UTI with background dementia.  At this time mentation much improved close to baseline continue UTI treatment supportive care fall precaution.  Plan is for skilled nursing facility.  Positive D dimer 1.6: in ED at 1 pm 9/8: no chest pain or leg swelling-not on oxygen- ??etiology. Check duplex/VQ. Her creat clearance is not great . Discussed w/ son- hold off on CTA since low suspicion.  UTI: Continue ceftriaxone follow-up culture data  Hypertension urgency BP is well controlled , was  elevated on admission holding beta-blocker due to bradycardia started on amlodipine 2.5, continue Avapro monitor Sinus bradycardia asymptomatic holding beta-blocker.heart rate is stable.   CKD stage IIIb stable Hypothyroidism continue Synthroid Frequent fall PT OT and skilled nursing facility requested  Pressure injury POA-off loading.wound care Goals of care DNR currently enrolled in outpatient palliative care. Consult palliative care. Significant weight loss female weight loss 7.6 kg since July dietitian consulted Nutrition Status:          DVT prophylaxis: enoxaparin (LOVENOX) injection 30 mg Start: 01/11/22 2200 Code Status:   Code Status: DNR Family Communication: plan of care discussed with patient at bedside.  Son was updated 9/9   Patient status is: inapatient for management of acute metabolic encephalopathy  Level of care: Telemetry  Dispo: The patient is from: home            Anticipated disposition: SNF once bed available  Mobility Assessment (last 72 hours)     Mobility Assessment     Row Name 01/12/22 1442 01/12/22 1010 01/11/22 1700  Does patient have an order for bedrest or is patient medically unstable -- -- No - Continue assessment     What is the highest level of mobility based on the progressive mobility assessment? Level 5 (Walks with assist in room/hall) - Balance while stepping forward/back and can walk in room with assist - Complete  Level 3 (Stands with assist) - Balance while standing  and cannot march in place Level 1 (Bedfast) - Unable to balance while sitting on edge of bed     Is the above level different from baseline mobility prior to current illness? -- -- Yes - Recommend PT order               Objective: Vitals last 24 hrs: Vitals:   01/12/22 0517 01/12/22 1330 01/12/22 2105 01/13/22 0513  BP: (!) 127/51 (!) 172/51 (!) 124/55 (!) 148/58  Pulse: (!) 58 61 67 70  Resp: '18 16 18 15  '$ Temp: 98.4 F (36.9 C) 97.8 F (36.6 C) 98 F (36.7 C) 98.2 F (36.8 C)  TempSrc: Oral Oral Oral Oral  SpO2: 94% 100% 100% 95%   Weight change:   Physical Examination: General exam: AAox2, older than stated age, weak appearing. HEENT:Oral mucosa moist, Ear/Nose WNL grossly, dentition normal. Respiratory system: bilaterally diminished, no use of accessory muscle Cardiovascular system: S1 & S2 +, No JVD,. Gastrointestinal system: Abdomen soft,NT,ND,BS+ Nervous System:Alert, awake, moving extremities and grossly nonfocal Extremities: LE ankle edema neg, distal peripheral pulses palpable.  Skin: No rashes,no icterus. MSK: Normal muscle bulk,tone, power   Medications reviewed:  Scheduled Meds:  amLODipine  2.5 mg Oral Daily   vitamin C  500 mg Oral Daily   enoxaparin (LOVENOX) injection  30 mg Subcutaneous Q24H   irbesartan  300 mg Oral q morning   levothyroxine  75 mcg Oral Q0600   nystatin   Topical TID   zinc sulfate  220 mg Oral Daily   Continuous Infusions:  cefTRIAXone (ROCEPHIN)  IV 1 g (01/12/22 1655)   Diet Order             Diet Heart Room service appropriate? Yes; Fluid consistency: Thin  Diet effective now                  Intake/Output Summary (Last 24 hours) at 01/13/2022 1041 Last data filed at 01/13/2022 0400 Gross per 24 hour  Intake 340 ml  Output 650 ml  Net -310 ml   Net IO Since Admission: 213 mL [01/13/22 1041]  Wt Readings from Last 3 Encounters:  11/21/21 66.7 kg  10/02/20  67.1 kg  02/02/19 59 kg    Find [employer] (From admission, onward)     Start     Ordered   01/18/22 0500  Creatinine, serum  (enoxaparin (LOVENOX)    CrCl >/= 30 ml/min)  Weekly,   R     Comments: while on enoxaparin therapy    01/11/22 1602          Data Reviewed: I have personally reviewed following labs and imaging studies CBC: Recent Labs  Lab 01/11/22 1338  WBC 4.9  NEUTROABS 3.3  HGB 11.7*  HCT 37.8  MCV 102.4*  PLT 419*   Basic Metabolic Panel: Recent Labs  Lab 01/11/22 1338  NA 139  K 4.2  CL 110  CO2 25  GLUCOSE 138*  BUN 25*  CREATININE 1.17*  CALCIUM 9.2   GFR: CrCl cannot be calculated (Unknown ideal weight.). Liver Function Tests: Recent Labs  Lab 01/11/22 1338  AST 14*  ALT 9  ALKPHOS 53  BILITOT 0.7  PROT 5.9*  ALBUMIN 3.3*   Thyroid Function Tests: Recent Labs    01/12/22 0803  TSH 1.077   Sepsis Labs: Recent Labs  Lab 01/11/22 1338 01/11/22 1607  LATICACIDVEN 1.0 1.1    Antimicrobials: Anti-infectives (From admission, onward)    Start     Dose/Rate Route Frequency Ordered Stop   01/12/22 1600  cefTRIAXone (ROCEPHIN) 1 g in sodium chloride 0.9 % 100 mL IVPB        1 g 200 mL/hr over 30 Minutes Intravenous Every 24 hours 01/11/22 1815     01/11/22 2200  molnupiravir EUA (LAGEVRIO) capsule 800 mg  Status:  Discontinued        4 capsule Oral 2 times daily 01/11/22 1615 01/11/22 1617   01/11/22 1800  nirmatrelvir/ritonavir EUA (renal dosing) (PAXLOVID) 2 tablet  Status:  Discontinued        2 tablet Oral 2 times daily 01/11/22 1617 01/11/22 1735   01/11/22 1600  cefTRIAXone (ROCEPHIN) 1 g in sodium chloride 0.9 % 100 mL IVPB        1 g 200 mL/hr over 30 Minutes Intravenous  Once 01/11/22 1546 01/11/22 1635      Culture/Microbiology    Component Value Date/Time   SDES  01/11/2022 1458    IN/OUT CATH URINE Performed at [facility], [place] [address], [place], [place] [address]    SPECREQUEST   01/11/2022 1458    NONE Performed at [facility], [place] [address], [place], [place] [address]    CULT  01/11/2022 1458    NO GROWTH Performed at [facility] [address] [place], [place] [address]    REPTSTATUS 01/13/2022 FINAL 01/11/2022 1458    Other culture-see note  Radiology Studies: No results found.   LOS: 1 day   Antonieta Pert, MD [facility]  01/13/2022, 10:41 AM

## 2022-01-14 ENCOUNTER — Inpatient Hospital Stay (HOSPITAL_COMMUNITY): Payer: Medicare HMO

## 2022-01-14 DIAGNOSIS — N390 Urinary tract infection, site not specified: Secondary | ICD-10-CM | POA: Diagnosis not present

## 2022-01-14 DIAGNOSIS — E43 Unspecified severe protein-calorie malnutrition: Secondary | ICD-10-CM | POA: Insufficient documentation

## 2022-01-14 MED ORDER — TECHNETIUM TO 99M ALBUMIN AGGREGATED
4.4000 | Freq: Once | INTRAVENOUS | Status: AC
  Administered 2022-01-14: 4.4 via INTRAVENOUS

## 2022-01-14 MED ORDER — BOOST PLUS PO LIQD
237.0000 mL | Freq: Two times a day (BID) | ORAL | Status: DC
  Administered 2022-01-14 – 2022-01-21 (×10): 237 mL via ORAL
  Filled 2022-01-14 (×15): qty 237

## 2022-01-14 NOTE — NC FL2 (Signed)
[place] LEVEL OF CARE SCREENING TOOL     IDENTIFICATION  Patient Name: Tiffany [name] Birthdate: April 03, 1927 Sex: female Admission Date (Current Location): 01/11/2022  [facility] and [place] Number:  Herbalist and Address:  [facility],  [place] [address] [place]      Provider Number: [id number]  Attending Physician Name and Address:  Antonieta Pert, MD  Relative Name and Phone Number:  pete bunch son- [phone]    Current Level of Care: Hospital Recommended Level of Care: [place] Prior Approval Number:    Date Approved/Denied:   PASRR Number: [id number]  Discharge Plan: SNF    Current Diagnoses: Patient Active Problem List   Diagnosis Date Noted   Foul smelling urine 01/11/2022   Dehydration 01/11/2022   Acute UTI (urinary tract infection) 01/11/2022   Hypertensive urgency 01/11/2022   Sinus bradycardia 01/11/2022   Late onset Alzheimer dementia ([place]) 11/30/2021   Malnutrition of moderate degree 87/56/4332   Acute metabolic encephalopathy 95/18/8416   Frequent falls 10/02/2020   Cerebral embolism with cerebral infarction 12/23/2017   Pressure injury of skin 12/22/2017   CKD (chronic kidney disease) stage 3, GFR 30-59 ml/min (HCC) 12/21/2017   Hypothyroidism 12/21/2017   Internal bleeding hemorrhoids 10/26/2012   HTN (hypertension) 10/04/2012    Orientation RESPIRATION BLADDER Height & Weight     Self, Time, Situation, Place  Normal Continent Weight:   Height:     BEHAVIORAL SYMPTOMS/MOOD NEUROLOGICAL BOWEL NUTRITION STATUS      Continent Diet (regular)  AMBULATORY STATUS COMMUNICATION OF NEEDS Skin   Extensive Assist Verbally Normal                       Personal Care Assistance Level of Assistance  Bathing, Feeding, Dressing Bathing Assistance: Limited assistance Feeding assistance: Limited assistance Dressing Assistance: Limited assistance     Functional Limitations Info  Sight,  Hearing, Speech Sight Info: Adequate Hearing Info: Adequate Speech Info: Adequate    SPECIAL CARE FACTORS FREQUENCY  PT (By licensed PT), OT (By licensed OT)     PT Frequency: 5 x weekly OT Frequency: 5 x weekly            Contractures Contractures Info: Not present    Additional Factors Info  Code Status Code Status Info: DNR             Current Medications (01/14/2022):  This is the current hospital active medication list Current Facility-Administered Medications  Medication Dose Route Frequency Provider Last Rate Last Admin   acetaminophen (TYLENOL) tablet 650 mg  650 mg Oral Q6H PRN Reubin Milan, MD   650 mg at 01/12/22 1009   Or   acetaminophen (TYLENOL) suppository 650 mg  650 mg Rectal Q6H PRN Reubin Milan, MD       amLODipine (NORVASC) tablet 2.5 mg  2.5 mg Oral Daily Reubin Milan, MD   2.5 mg at 01/13/22 1035   ascorbic acid (VITAMIN C) tablet 500 mg  500 mg Oral Daily Reubin Milan, MD   500 mg at 01/13/22 1034   cefTRIAXone (ROCEPHIN) 1 g in sodium chloride 0.9 % 100 mL IVPB  1 g Intravenous Q24H Reubin Milan, MD 200 mL/hr at 01/13/22 1828 1 g at 01/13/22 1828   enoxaparin (LOVENOX) injection 30 mg  30 mg Subcutaneous Q24H Reubin Milan, MD   30 mg at 01/12/22 2106   hydrALAZINE (APRESOLINE) tablet 25 mg  25  mg Oral Q6H PRN Reubin Milan, MD   25 mg at 01/11/22 1810   irbesartan (AVAPRO) tablet 300 mg  300 mg Oral q morning Reubin Milan, MD   300 mg at 01/13/22 1034   levothyroxine (SYNTHROID) tablet 75 mcg  75 mcg Oral Q0600 Reubin Milan, MD   75 mcg at 01/14/22 7517   nystatin (MYCOSTATIN/NYSTOP) topical powder   Topical TID Reubin Milan, MD   Given at 01/13/22 1732   ondansetron [facility]) tablet 4 mg  4 mg Oral Q6H PRN Reubin Milan, MD       Or   ondansetron [facility]) injection 4 mg  4 mg Intravenous Q6H PRN Reubin Milan, MD       zinc sulfate capsule 220 mg  220 mg Oral Daily  Reubin Milan, MD   220 mg at 01/13/22 1034     Discharge Medications: Please see discharge summary for a list of discharge medications.  Relevant Imaging Results:  Relevant Lab Results:   Additional Information SS# [id number]  Leeroy Cha, RN

## 2022-01-14 NOTE — TOC Progression Note (Addendum)
Transition of Care [facility]) - Progression Note    Patient Details  Name: Tiffany [name] MRN: [id number] Date of Birth: 1927-02-06  [facility] CM/SW Contact  Leeroy Cha, RN Phone Number: 01/14/2022, 9:32 AM  Clinical Narrative:    Fl2 note sent out to area snf for review. Tct-son pete-wants mother to go to [facility] [place] or [employer] [facility].  Auth started by christie at [employer].         Expected Discharge Plan and Services                                                 Social Determinants of Health (SDOH) Interventions    Readmission Risk Interventions   No data to display

## 2022-01-14 NOTE — Progress Notes (Signed)
PROGRESS NOTE Tiffany [name]  OIZ:[id number] DOB: 08-Jul-1926 DOA: 01/11/2022 PCP: Donald Prose, MD   Brief Narrative/Hospital Course:  88 y.o.f w/medical history significant of AKI, atypical mole, basal cell carcinoma, breast cancer, colon polyps, hypertension, hypothyroidism, osteoporosis, rectal bleeding, rhabdomyolysis, history of altered nonhemorrhagic CVA, history of multiple UTIs, dementia currently enrolled in [facility] outpatient palliative care who is brought to the ED by her son due to worsening dementia over the last few days. He stated this happens when she gets a UTI.  She has also been very weak and has had a few falls over the last month and a half.  ED course: Vitals stable with sinus bradycardia:Urinalysis shows small leukocyte esterase with 21-50 WBC with budding yeast.  CBC with a white count of 4.9, hemoglobin 11.7 g/dL with an MCV 102.4 fL and platelets 140.  CMP showed normal electrolytes.  Glucose 138, BUN 25 and creatinine 1.17 mg/dL.  Total protein 5.9 albumin 3.3 g/dL.  The rest of the LFTs were unremarkable.  Lactic acid was normal twice.  Coronavirus influenza PCR were negative.  Patient placed on IV ceftriaxone and admitted. Patient being managed with IV antibiotics.  Overall alert awake.  Due to elevated D-dimer ordered VQ scan and duplex.  Seen by PT OT and has advised skilled nursing facility and son wants to pursue. PMT has been consulted.  Plan is for skilled nursing facility once bed available.  Urine culture came back negative, seen by palliative care MOST form filled out.  Duplex of the leg negative for DVT.  Once VQ scan negative she can be discharged    Subjective: Seen and examined, resting comfortably on bed, pleasantly confused No complaints. Overnight afebrile, doing well on room air at 98%  Assessment and Plan: Principal Problem:   Acute UTI (urinary tract infection) Active Problems:   Acute metabolic encephalopathy   CKD (chronic kidney disease) stage  3, GFR 30-59 ml/min (HCC)   Hypothyroidism   Pressure injury of skin   Frequent falls   Hypertensive urgency   Sinus bradycardia   Acute metabolic encephalopathy Dementia: Mental status worsening from baseline in the setting of UTI with background dementia.  At this time mentation much improved close to baseline continue UTI treatment supportive care fall precaution.  Plan is for skilled nursing facility.  Positive D dimer 1.6: in ED at 1 pm 9/8: no chest pain or leg swelling-not on oxygen- ??etiology. negative duplex.VQ scan pending.Her creat clearance is not great . Discussed w/ son- hold off on CTA since low suspicion.   UTI: Urine culture negative,on ceftriaxone-will be completed x5 days Hypertension urgency BP is well controlled , was  elevated on admission holding beta-blocker due to bradycardia started on amlodipine 2.5, continue Avapro monitor Sinus bradycardia asymptomatic holding beta-blocker.heart rate is stable.   CKD stage IIIb stable Hypothyroidism continue Synthroid. Frequent fall PT OT and skilled nursing facility requested  Goals of care DNR currently enrolled in outpatient palliative care.  Seen by palliative care here. Significant weight loss female weight loss 7.6 kg since July dietitian consulted Nutrition Status:          DVT prophylaxis: enoxaparin (LOVENOX) injection 30 mg Start: 01/11/22 2200 Code Status:   Code Status: DNR Family Communication: plan of care discussed with patient at bedside.  Son was updated 9/9   Patient status is: inapatient for management of acute metabolic encephalopathy  Level of care: Telemetry  Dispo: The patient is from: home  Anticipated disposition: SNF once bed available.  She is medically stable  Mobility Assessment (last 72 hours)     Mobility Assessment     Row Name 01/14/22 1100 01/12/22 1442 01/12/22 1010 01/11/22 1700     Does patient have an order for bedrest or is patient medically unstable No - Continue  assessment -- -- No - Continue assessment    What is the highest level of mobility based on the progressive mobility assessment? Level 5 (Walks with assist in room/hall) - Balance while stepping forward/back and can walk in room with assist - Complete Level 5 (Walks with assist in room/hall) - Balance while stepping forward/back and can walk in room with assist - Complete Level 3 (Stands with assist) - Balance while standing  and cannot march in place Level 1 (Bedfast) - Unable to balance while sitting on edge of bed    Is the above level different from baseline mobility prior to current illness? Yes - Recommend PT order -- -- Yes - Recommend PT order              Objective: Vitals last 24 hrs: Vitals:   01/13/22 2117 01/14/22 0600 01/14/22 0616 01/14/22 1150  BP: (!) 147/71 (!) 176/163 (!) 142/56 (!) 163/50  Pulse: 91 66 71   Resp: 16 19    Temp: 98.4 F (36.9 C) 98.3 F (36.8 C)    TempSrc: Oral Oral    SpO2: 97% 95%     Weight change:   Physical Examination: General exam: AAox2, older than stated age, weak appearing. HEENT:Oral mucosa moist, Ear/Nose WNL grossly, dentition normal. Respiratory system: bilaterally clear, no use of accessory muscle Cardiovascular system: S1 & S2 +, No JVD,. Gastrointestinal system: Abdomen soft,NT,ND,BS+ Nervous System:Alert, awake, moving extremities and grossly nonfocal Extremities: LE ankle edema neg, distal peripheral pulses palpable.  Skin: No rashes,no icterus. MSK: Normal muscle bulk,tone, power    Medications reviewed:  Scheduled Meds:  amLODipine  2.5 mg Oral Daily   vitamin C  500 mg Oral Daily   enoxaparin (LOVENOX) injection  30 mg Subcutaneous Q24H   irbesartan  300 mg Oral q morning   levothyroxine  75 mcg Oral Q0600   nystatin   Topical TID   zinc sulfate  220 mg Oral Daily   Continuous Infusions:  cefTRIAXone (ROCEPHIN)  IV 1 g (01/13/22 1828)   Diet Order             Diet Heart Room service appropriate? Yes; Fluid  consistency: Thin  Diet effective now                  Intake/Output Summary (Last 24 hours) at 01/14/2022 1324 Last data filed at 01/14/2022 0258 Gross per 24 hour  Intake 240 ml  Output 800 ml  Net -560 ml    Net IO Since Admission: 133 mL [01/14/22 1324]  Wt Readings from Last 3 Encounters:  11/21/21 66.7 kg  10/02/20 67.1 kg  02/02/19 59 kg    [employer] (From admission, onward)     Start     Ordered   01/18/22 0500  Creatinine, serum  (enoxaparin (LOVENOX)    CrCl >/= 30 ml/min)  Weekly,   R     Comments: while on enoxaparin therapy    01/11/22 1602          Data Reviewed: I have personally reviewed following labs and imaging studies CBC: Recent Labs  Lab 01/11/22 1338  WBC 4.9  NEUTROABS 3.3  HGB 11.7*  HCT 37.8  MCV 102.4*  PLT 140*    Basic Metabolic Panel: Recent Labs  Lab 01/11/22 1338  NA 139  K 4.2  CL 110  CO2 25  GLUCOSE 138*  BUN 25*  CREATININE 1.17*  CALCIUM 9.2    GFR: CrCl cannot be calculated (Unknown ideal weight.). Liver Function Tests: Recent Labs  Lab 01/11/22 1338  AST 14*  ALT 9  ALKPHOS 53  BILITOT 0.7  PROT 5.9*  ALBUMIN 3.3*    Thyroid Function Tests: Recent Labs    01/12/22 0803  TSH 1.077    Sepsis Labs: Recent Labs  Lab 01/11/22 1338 01/11/22 1607  LATICACIDVEN 1.0 1.1     Antimicrobials: Anti-infectives (From admission, onward)    Start     Dose/Rate Route Frequency Ordered Stop   01/12/22 1600  cefTRIAXone (ROCEPHIN) 1 g in sodium chloride 0.9 % 100 mL IVPB        1 g 200 mL/hr over 30 Minutes Intravenous Every 24 hours 01/11/22 1815     01/11/22 2200  molnupiravir EUA (LAGEVRIO) capsule 800 mg  Status:  Discontinued        4 capsule Oral 2 times daily 01/11/22 1615 01/11/22 1617   01/11/22 1800  nirmatrelvir/ritonavir EUA (renal dosing) (PAXLOVID) 2 tablet  Status:  Discontinued        2 tablet Oral 2 times daily 01/11/22 1617 01/11/22 1735   01/11/22 1600  cefTRIAXone  (ROCEPHIN) 1 g in sodium chloride 0.9 % 100 mL IVPB        1 g 200 mL/hr over 30 Minutes Intravenous  Once 01/11/22 1546 01/11/22 1635      Culture/Microbiology    Component Value Date/Time   SDES  01/11/2022 1458    IN/OUT CATH URINE Performed at [facility], [place] [address] [place], [place] [address]    SPECREQUEST  01/11/2022 1458    NONE Performed at [facility], [place] [address] [place], [place] [address]    CULT  01/11/2022 1458    NO GROWTH Performed at [place] [address], [place], [place] [address]    REPTSTATUS 01/13/2022 FINAL 01/11/2022 1458    Other culture-see note  Radiology Studies: VAS [place] LOWER EXTREMITY VENOUS (DVT)  Result Date: 01/13/2022  Lower Venous DVT Study Patient Name:  TAMYRA FOJTIK  Date of Exam:   01/13/2022 Medical Rec #: [id number]         Accession #:    [id number] Date of Birth: [birth year]         Patient Gender: F Patient Age:   70 years Exam Location:  [facility] Procedure:      VAS [place] LOWER EXTREMITY VENOUS (DVT) Referring Phys: [place] --------------------------------------------------------------------------------  Indications: Edema.  Limitations: Body habitus, poor ultrasound/tissue interface and restricted mobility, dementia. Comparison Study: No prior study Performing Technologist: Maudry Mayhew MHA, RDMS, RVT, RDCS  Examination Guidelines: A complete evaluation includes B-mode imaging, spectral Doppler, color Doppler, and power Doppler as needed of all accessible portions of each vessel. Bilateral testing is considered an integral part of a complete examination. Limited examinations for reoccurring indications may be performed as noted. The reflux portion of the exam is performed with the patient in reverse Trendelenburg.  +---------+---------------+---------+-----------+----------+--------------+ RIGHT    CompressibilityPhasicitySpontaneityPropertiesThrombus Aging  +---------+---------------+---------+-----------+----------+--------------+ CFV      Full           Yes      Yes                                 +---------+---------------+---------+-----------+----------+--------------+  SFJ      Full                                                        +---------+---------------+---------+-----------+----------+--------------+ FV Prox  Full                                                        +---------+---------------+---------+-----------+----------+--------------+ FV Mid   Full                                                        +---------+---------------+---------+-----------+----------+--------------+ FV Distal               Yes      Yes                                 +---------+---------------+---------+-----------+----------+--------------+ PFV      Full                                                        +---------+---------------+---------+-----------+----------+--------------+ POP      Full           Yes      Yes                                 +---------+---------------+---------+-----------+----------+--------------+ PTV      Full                    Yes                                 +---------+---------------+---------+-----------+----------+--------------+   Right Technical Findings: Not visualized segments include peroneal veins.  +---------+---------------+---------+-----------+----------+--------------+ LEFT     CompressibilityPhasicitySpontaneityPropertiesThrombus Aging +---------+---------------+---------+-----------+----------+--------------+ CFV      Full           Yes      Yes                                 +---------+---------------+---------+-----------+----------+--------------+ SFJ      Full                                                        +---------+---------------+---------+-----------+----------+--------------+ FV Prox  Full                                                         +---------+---------------+---------+-----------+----------+--------------+  FV Mid   Full                                                        +---------+---------------+---------+-----------+----------+--------------+ FV DistalFull                                                        +---------+---------------+---------+-----------+----------+--------------+ POP      Full           Yes      Yes                                 +---------+---------------+---------+-----------+----------+--------------+ PTV      Full                                                        +---------+---------------+---------+-----------+----------+--------------+ PERO     Full                                                        +---------+---------------+---------+-----------+----------+--------------+   Left Technical Findings: Not visualized segments include PFV.   Summary: RIGHT: - There is no evidence of deep vein thrombosis in the lower extremity. However, portions of this examination were limited- see technologist comments above.  - No cystic structure found in the popliteal fossa.  LEFT: - There is no evidence of deep vein thrombosis in the lower extremity. However, portions of this examination were limited- see technologist comments above.  - No cystic structure found in the popliteal fossa.  *See table(s) above for measurements and observations.    Preliminary      LOS: 2 days   Antonieta Pert, MD [facility]  01/14/2022, 1:24 PM

## 2022-01-14 NOTE — Progress Notes (Addendum)
Initial Nutrition Assessment  DOCUMENTATION CODES:   Severe malnutrition in context of chronic illness  INTERVENTION:   -Boost Plus BID- Each supplement provides 360kcal and 14g protein.     -Added orange sherbet and icees to meals  -Liberalized diet to regular to maximize menu options  NUTRITION DIAGNOSIS:   Severe Malnutrition related to chronic illness (dementia) as evidenced by severe fat depletion, severe muscle depletion, energy intake < or equal to 75% for > or equal to 1 month.  GOAL:   Patient will meet greater than or equal to 90% of their needs  MONITOR:   PO intake, Supplement acceptance, Labs, Weight trends, I & O's  REASON FOR ASSESSMENT:   Consult Assessment of nutrition requirement/status  ASSESSMENT:   89 y.o.f w/medical history significant of AKI, atypical mole, basal cell carcinoma, breast cancer, colon polyps, hypertension, hypothyroidism, osteoporosis, rectal bleeding, rhabdomyolysis, history of altered nonhemorrhagic CVA, history of multiple UTIs, dementia currently enrolled in [place] outpatient palliative care who is brought to the ED by her son due to worsening dementia over the last few days. He stated this happens when she gets a UTI.  She has also been very weak and has had a few falls over the last month and a half.  Patient in room, son at bedside. Patient's son states she has not been eating at all today. Both breakfast and lunch trays are untouched. Pt ate better yesterday. Pt states she has not been great at drinking fluids. Pt drank chocolate Boost at home for a long time but all of a sudden didn't drink them anymore. Pt states she likes chocolate milk but chocolate is listed in her allergies. Pt's son explains that they avoid chocolate but she is able to tolerate chocolate milk. Will order Boost  as well.  Will liberalize diet to aid in intakes and maximize menu options.   Per weight records, last recorded weight is from July  2023.  Medications: Vitamin C, Zinc sulfate  Labs reviewed.  NUTRITION - FOCUSED PHYSICAL EXAM:  Flowsheet Row Most Recent Value  Orbital Region Severe depletion  Upper Arm Region Severe depletion  Thoracic and Lumbar Region Severe depletion  Buccal Region Severe depletion  Temple Region Severe depletion  Clavicle Bone Region Severe depletion  Clavicle and Acromion Bone Region Severe depletion  Scapular Bone Region Severe depletion  Dorsal Hand Severe depletion  Patellar Region Severe depletion  Anterior Thigh Region Severe depletion  Posterior Calf Region Severe depletion  Edema (RD Assessment) None  Hair Reviewed  Eyes Reviewed  Mouth Reviewed  Skin Reviewed       Diet Order:   Diet Order             Diet regular Room service appropriate? No; Fluid consistency: Thin  Diet effective now                   EDUCATION NEEDS:   No education needs have been identified at this time  Skin:  Skin Assessment: Reviewed RN Assessment  Last BM:  PTA  Height:   Ht Readings from Last 1 Encounters:  11/21/21 5' 6.5" (1.689 m)    Weight:   Wt Readings from Last 1 Encounters:  11/21/21 66.7 kg   BMI:  There is no height or weight on file to calculate BMI.  Estimated Nutritional Needs:   Kcal:  1450-1650  Protein:  70-80g  Fluid:  1.7L/day   Clayton Bibles, MS, RD, LDN Inpatient Clinical Dietitian Contact information available via Amion

## 2022-01-15 DIAGNOSIS — N390 Urinary tract infection, site not specified: Secondary | ICD-10-CM | POA: Diagnosis not present

## 2022-01-15 NOTE — Progress Notes (Signed)
PROGRESS NOTE Tiffany [name]  TSV:[id number] DOB: Apr 22, 1927 DOA: 01/11/2022 PCP: Donald Prose, MD   Brief Narrative/Hospital Course:  88 y.o.f w/medical history significant of AKI, atypical mole, basal cell carcinoma, breast cancer, colon polyps, hypertension, hypothyroidism, osteoporosis, rectal bleeding, rhabdomyolysis, history of altered nonhemorrhagic CVA, history of multiple UTIs, dementia currently enrolled in [place] outpatient palliative care who is brought to the ED by her son due to worsening dementia over the last few days. He stated this happens when she gets a UTI.  She has also been very weak and has had a few falls over the last month and a half.  ED course: Vitals stable with sinus bradycardia:Urinalysis shows small leukocyte esterase with 21-50 WBC with budding yeast.  CBC with a white count of 4.9, hemoglobin 11.7 g/dL with an MCV 102.4 fL and platelets 140.  CMP showed normal electrolytes.  Glucose 138, BUN 25 and creatinine 1.17 mg/dL.  Total protein 5.9 albumin 3.3 g/dL.  The rest of the LFTs were unremarkable.  Lactic acid was normal twice.  Coronavirus influenza PCR were negative.  Patient placed on IV ceftriaxone and admitted. Patient being managed with IV antibiotics.  Overall alert awake.  Due to elevated D-dimer ordered VQ scan and duplex.  Seen by PT OT and has advised skilled nursing facility and son wants to pursue. PMT has been consulted.  Plan is for skilled nursing facility once bed available.  Urine culture came back negative, seen by palliative care MOST form filled out.  Duplex of the leg negative for DVT.  Once VQ scan negative she can be discharged    Subjective:  Seen examined No new complaints  Assessment and Plan: Principal Problem:   Acute UTI (urinary tract infection) Active Problems:   Acute metabolic encephalopathy   CKD (chronic kidney disease) stage 3, GFR 30-59 ml/min (HCC)   Hypothyroidism   Pressure injury of skin   Frequent falls    Hypertensive urgency   Sinus bradycardia   Protein-calorie malnutrition, severe   Acute metabolic encephalopathy Dementia: Mental status worsening from baseline in the setting of UTI with background dementia.At this time mentation much improved close to baseline continue UTI treatment supportive care fall precaution. Plan is for skilled nursing facility.  Positive D dimer 1.6: in ED at 1 pm 9/8: no chest pain or leg swelling-not on oxygen- ??etiology. negative duplex.VQ scan low probability.Discussed w/ son- hold off on CTA since low suspicion.   UTI: Urine culture negative,on ceftriaxone-will be completed x5 days Hypertension urgency BP is soft this am- beta-blocker held du to bradycardia on admission and-was started on amlodipine 2.5 - will D/C amlodipine-continue Avapro monitor> consider resuming metoprolol low-dose tomorrow.  Sinus bradycardia asymptomatic : Metoprolol for tomorrow has been held since admission if heart rate is stable and BP uptrending we will resume at 12.5 mg twice daily tomorrow.    CKD stage IIIb stable Hypothyroidism continue Synthroid.  Frequent fall:PT OT and skilled nursing facility requested for PTOT.  Goals of care DNR currently enrolled in outpatient palliative care.Seen by palliative care here.  Severe Malnutrition: weight loss 7.6 kg since July dietitian FOLLOWING Nutrition Problem: Severe Malnutrition Etiology: chronic illness (dementia) Signs/Symptoms: severe fat depletion, severe muscle depletion, energy intake < or equal to 75% for > or equal to 1 month Interventions: Boost Plus, Liberalize Diet  DVT prophylaxis: enoxaparin (LOVENOX) injection 30 mg Start: 01/11/22 2200 Code Status:   Code Status: DNR Family Communication: plan of care discussed with patient at bedside.  Son was updated  9/9   Patient status is: inapatient for management of acute metabolic encephalopathy  Level of care: Telemetry  Dispo: The patient is from:home.             Anticipated disposition:SNF once bed available.She is medically stable  Mobility Assessment (last 72 hours)     Mobility Assessment     Row Name 01/15/22 1300 01/15/22 0845 01/14/22 1100 01/12/22 1442     Does patient have an order for bedrest or is patient medically unstable -- No - Continue assessment No - Continue assessment --    What is the highest level of mobility based on the progressive mobility assessment? Level 5 (Walks with assist in room/hall) - Balance while stepping forward/back and can walk in room with assist - Complete Level 5 (Walks with assist in room/hall) - Balance while stepping forward/back and can walk in room with assist - Complete Level 5 (Walks with assist in room/hall) - Balance while stepping forward/back and can walk in room with assist - Complete Level 5 (Walks with assist in room/hall) - Balance while stepping forward/back and can walk in room with assist - Complete    Is the above level different from baseline mobility prior to current illness? -- Yes - Recommend PT order Yes - Recommend PT order --              Objective: Vitals last 24 hrs: Vitals:   01/14/22 2128 01/15/22 0510 01/15/22 0956 01/15/22 0959  BP: (!) 160/69 (!) 152/50 (!) 126/43 (!) 119/49  Pulse: 75 81 75   Resp: 17 17    Temp: 98.7 F (37.1 C) 99 F (37.2 C)    TempSrc: Oral Oral    SpO2: 97% 96%     Weight change:   Physical Examination: General exam: Aa0x2-3, older than stated age, weak appearing. HEENT:Oral mucosa moist, Ear/Nose WNL grossly, dentition normal. Respiratory system: bilaterally clear, no use of accessory muscle Cardiovascular system: S1 & S2 +, No JVD,. Gastrointestinal system: Abdomen soft,NT,ND,BS+ Nervous System:Alert, awake, moving extremities and grossly nonfocal Extremities: LE ankle edema neg,distal peripheral pulses palpable.  Skin: No rashes,no icterus. MSK: Normal muscle bulk,tone, power   Medications reviewed:  Scheduled Meds:  amLODipine   2.5 mg Oral Daily   vitamin C  500 mg Oral Daily   enoxaparin (LOVENOX) injection  30 mg Subcutaneous Q24H   irbesartan  300 mg Oral q morning   lactose free nutrition  237 mL Oral BID BM   levothyroxine  75 mcg Oral Q0600   nystatin   Topical TID   zinc sulfate  220 mg Oral Daily   Continuous Infusions:  cefTRIAXone (ROCEPHIN)  IV 1 g (01/14/22 1607)   Diet Order             Diet regular Room service appropriate? No; Fluid consistency: Thin  Diet effective now                  Intake/Output Summary (Last 24 hours) at 01/15/2022 1324 Last data filed at 01/15/2022 0950 Gross per 24 hour  Intake 120 ml  Output 750 ml  Net -630 ml    Net IO Since Admission: -497 mL [01/15/22 1324]  Wt Readings from Last 3 Encounters:  11/21/21 66.7 kg  10/02/20 67.1 kg  02/02/19 59 kg    Find Unresulted Labs (From admission, onward)     Start     Ordered   01/18/22 0500  Creatinine, serum  (enoxaparin (LOVENOX)    CrCl >/=  30 ml/min)  Weekly,   R     Comments: while on enoxaparin therapy    01/11/22 1602          Data Reviewed: I have personally reviewed following labs and imaging studies CBC: Recent Labs  Lab 01/11/22 1338  WBC 4.9  NEUTROABS 3.3  HGB 11.7*  HCT 37.8  MCV 102.4*  PLT 140*    Basic Metabolic Panel: Recent Labs  Lab 01/11/22 1338  NA 139  K 4.2  CL 110  CO2 25  GLUCOSE 138*  BUN 25*  CREATININE 1.17*  CALCIUM 9.2    GFR: CrCl cannot be calculated (Unknown ideal weight.). Liver Function Tests: Recent Labs  Lab 01/11/22 1338  AST 14*  ALT 9  ALKPHOS 53  BILITOT 0.7  PROT 5.9*  ALBUMIN 3.3*    Thyroid Function Tests: No results for input(s): "TSH", "T4TOTAL", "FREET4", "T3FREE", "THYROIDAB" in the last 72 hours.  Sepsis Labs: Recent Labs  Lab 01/11/22 1338 01/11/22 1607  LATICACIDVEN 1.0 1.1     Antimicrobials: Anti-infectives (From admission, onward)    Start     Dose/Rate Route Frequency Ordered Stop   01/12/22 1600   cefTRIAXone (ROCEPHIN) 1 g in sodium chloride 0.9 % 100 mL IVPB        1 g 200 mL/hr over 30 Minutes Intravenous Every 24 hours 01/11/22 1815 01/16/22 1559   01/11/22 2200  molnupiravir EUA (LAGEVRIO) capsule 800 mg  Status:  Discontinued        4 capsule Oral 2 times daily 01/11/22 1615 01/11/22 1617   01/11/22 1800  nirmatrelvir/ritonavir EUA (renal dosing) (PAXLOVID) 2 tablet  Status:  Discontinued        2 tablet Oral 2 times daily 01/11/22 1617 01/11/22 1735   01/11/22 1600  cefTRIAXone (ROCEPHIN) 1 g in sodium chloride 0.9 % 100 mL IVPB        1 g 200 mL/hr over 30 Minutes Intravenous  Once 01/11/22 1546 01/11/22 1635      Culture/Microbiology    Component Value Date/Time   SDES  01/11/2022 1458    IN/OUT CATH URINE Performed at [facility], [place] [address] [place], [place] [address]    SPECREQUEST  01/11/2022 1458    NONE Performed at [facility], [place] [address] [place], [place] [address]    CULT  01/11/2022 1458    NO GROWTH Performed at [facility] [place] [address], [place], [place] [address]    REPTSTATUS 01/13/2022 FINAL 01/11/2022 1458    Other culture-see note  Radiology Studies: NM Pulmonary Perfusion  Result Date: 01/14/2022 CLINICAL DATA:  PE suspected EXAM: NUCLEAR MEDICINE PERFUSION LUNG SCAN TECHNIQUE: Perfusion images were obtained in multiple projections after intravenous injection of radiopharmaceutical. Ventilation scans intentionally deferred if perfusion scan and chest x-ray adequate for interpretation during COVID 19 epidemic. RADIOPHARMACEUTICALS:  4.4 mCi Tc-45mMAA IV COMPARISON:  Same-day chest radiograph FINDINGS: Normal, homogeneous bilateral pulmonary perfusion. No suspicious perfusion defects. Cardiomegaly. IMPRESSION: Very low probability for pulmonary embolism by modified perfusion only PIOPED criteria (PE absent). Electronically Signed   By: ADelanna AhmadiM.D.   On: 01/14/2022 14:42   DG Chest Port  1 View  Result Date: 01/14/2022 CLINICAL DATA:  Shortness of breath EXAM: PORTABLE CHEST 1 VIEW COMPARISON:  11/21/2021 FINDINGS: Cardiomegaly. Mild, diffuse bilateral interstitial pulmonary opacity. Unchanged elevation of the left hemidiaphragm. Possible small bilateral pleural effusions. The visualized skeletal structures are unremarkable. IMPRESSION: 1. Cardiomegaly with mild, diffuse  bilateral interstitial pulmonary opacity, likely edema. No focal airspace opacity. 2.  Possible small bilateral pleural effusions. Electronically Signed   By: Delanna Ahmadi M.D.   On: 01/14/2022 14:41   VAS [place] LOWER EXTREMITY VENOUS (DVT)  Result Date: 01/14/2022  Lower Venous DVT Study Patient Name:  Tiffany [name]  Date of Exam:   01/13/2022 Medical Rec #: [id number]         Accession #:    [id number] Date of Birth: 11/17/1926         Patient Gender: F Patient Age:   29 years Exam Location:  [facility] Procedure:      VAS [place] LOWER EXTREMITY VENOUS (DVT) Referring Phys: [place] --------------------------------------------------------------------------------  Indications: Edema.  Limitations: Body habitus, poor ultrasound/tissue interface and restricted mobility, dementia. Comparison Study: No prior study Performing Technologist: Maudry Mayhew MHA, RDMS, RVT, RDCS  Examination Guidelines: A complete evaluation includes B-mode imaging, spectral Doppler, color Doppler, and power Doppler as needed of all accessible portions of each vessel. Bilateral testing is considered an integral part of a complete examination. Limited examinations for reoccurring indications may be performed as noted. The reflux portion of the exam is performed with the patient in reverse Trendelenburg.  +---------+---------------+---------+-----------+----------+--------------+ RIGHT    CompressibilityPhasicitySpontaneityPropertiesThrombus Aging +---------+---------------+---------+-----------+----------+--------------+ CFV      Full            Yes      Yes                                 +---------+---------------+---------+-----------+----------+--------------+ SFJ      Full                                                        +---------+---------------+---------+-----------+----------+--------------+ FV Prox  Full                                                        +---------+---------------+---------+-----------+----------+--------------+ FV Mid   Full                                                        +---------+---------------+---------+-----------+----------+--------------+ FV Distal               Yes      Yes                                 +---------+---------------+---------+-----------+----------+--------------+ PFV      Full                                                        +---------+---------------+---------+-----------+----------+--------------+ POP      Full           Yes  Yes                                 +---------+---------------+---------+-----------+----------+--------------+ PTV      Full                    Yes                                 +---------+---------------+---------+-----------+----------+--------------+   Right Technical Findings: Not visualized segments include peroneal veins.  +---------+---------------+---------+-----------+----------+--------------+ LEFT     CompressibilityPhasicitySpontaneityPropertiesThrombus Aging +---------+---------------+---------+-----------+----------+--------------+ CFV      Full           Yes      Yes                                 +---------+---------------+---------+-----------+----------+--------------+ SFJ      Full                                                        +---------+---------------+---------+-----------+----------+--------------+ FV Prox  Full                                                         +---------+---------------+---------+-----------+----------+--------------+ FV Mid   Full                                                        +---------+---------------+---------+-----------+----------+--------------+ FV DistalFull                                                        +---------+---------------+---------+-----------+----------+--------------+ POP      Full           Yes      Yes                                 +---------+---------------+---------+-----------+----------+--------------+ PTV      Full                                                        +---------+---------------+---------+-----------+----------+--------------+ PERO     Full                                                        +---------+---------------+---------+-----------+----------+--------------+   Left  Technical Findings: Not visualized segments include PFV.   Summary: RIGHT: - There is no evidence of deep vein thrombosis in the lower extremity. However, portions of this examination were limited- see technologist comments above.  - No cystic structure found in the popliteal fossa.  LEFT: - There is no evidence of deep vein thrombosis in the lower extremity. However, portions of this examination were limited- see technologist comments above.  - No cystic structure found in the popliteal fossa.  *See table(s) above for measurements and observations. Electronically signed by Monica Martinez MD on 01/14/2022 at 1:48:19 PM.    Final      LOS: 3 days   Antonieta Pert, MD [facility]  01/15/2022, 1:24 PM

## 2022-01-15 NOTE — Progress Notes (Signed)
Occupational Therapy Treatment Patient Details Name: Tiffany [name] MRN: [id number] DOB: 1926/10/05 Today's Date: 01/15/2022   History of present illness 88 y.o. female with medical history significant of AKI, atypical mole, basal cell carcinoma, breast cancer, colon polyps, hypertension, hypothyroidism, osteoporosis, rectal bleeding, rhabdomyolysis, history of altered nonhemorrhagic CVA, history of multiple UTIs, dementia who is brought to the emergency department by her son due to worsening dementia over the last few days.  He stated this happens when she gets a UTI.  She has also been very weak and has had a few falls over the last month and a half.   OT comments  Pt making slow progress toward all set OT goals. Pt comes from independent living with a PCA a few hours a day but is too confused and balance is impaired therefore is not safe to return to that environment without rehab.  Rec SNF at this time as pt will not tolerate  3 hours of therapy a day and is not safe to be alone outside of the hours with PCA.  Will continue to see with focus on adls in the bathroom on on pt's feet.    Recommendations for follow up therapy are one component of a multi-disciplinary discharge planning process, led by the attending physician.  Recommendations may be updated based on patient status, additional functional criteria and insurance authorization.    Follow Up Recommendations  Skilled nursing-short term rehab (<3 hours/day)    Assistance Recommended at Discharge Frequent or constant Supervision/Assistance  Patient can return home with the following  A little help with walking and/or transfers;A lot of help with bathing/dressing/bathroom;Assistance with cooking/housework;Direct supervision/assist for financial management;Help with stairs or ramp for entrance;Direct supervision/assist for medications management;Assist for transportation   Equipment Recommendations  None recommended by OT     Recommendations for Other Services      Precautions / Restrictions Precautions Precautions: Fall Restrictions Weight Bearing Restrictions: No       Mobility Bed Mobility Overal bed mobility: Needs Assistance Bed Mobility: Supine to Sit, Sit to Supine     Supine to sit: Min assist Sit to supine: Min assist   General bed mobility comments: Increased time to transfer back into bed.    Transfers Overall transfer level: Needs assistance Equipment used: Rolling walker (2 wheels) Transfers: Sit to/from Stand Sit to Stand: Min assist Stand pivot transfers: Min assist         General transfer comment: Cues for hand placement and safety     Balance Overall balance assessment: Needs assistance Sitting-balance support: Feet supported, No upper extremity supported Sitting balance-Leahy Scale: Fair Sitting balance - Comments: posterior lean in sitting requiring min A to correct Postural control: Posterior lean Standing balance support: During functional activity, Reliant on assistive device for balance Standing balance-Leahy Scale: Poor Standing balance comment: relies on BUE support                           ADL either performed or assessed with clinical judgement   ADL Overall ADL's : Needs assistance/impaired Eating/Feeding: Supervision/ safety;Sitting Eating/Feeding Details (indicate cue type and reason): Pt sat EOB for appx 15 minutes to feed self. pt required assist for balance with a posterior lean.                     Toilet Transfer: Moderate assistance;Stand-pivot;Rolling walker (2 wheels);BSC/3in1 Toilet Transfer Details (indicate cue type and reason): Pt required mod assist on first attempt  to [facility] and min assist to return. Pt states back is sore with all mobility. Toileting- Clothing Manipulation and Hygiene: Maximal assistance;Sit to/from stand Toileting - Clothing Manipulation Details (indicate cue type and reason): able to wipe self in  seated position but needed assistance for clothing management and perianal care     Functional mobility during ADLs: Minimal assistance;Rolling walker (2 wheels) General ADL Comments: Pt fed self at EOB and then transferred to [facility].  Pt fatigued after meal and returned to bed.    Extremity/Trunk Assessment Upper Extremity Assessment Upper Extremity Assessment: Generalized weakness            Vision   Vision Assessment?: No apparent visual deficits   Perception     Praxis      Cognition Arousal/Alertness: Awake/alert Behavior During Therapy: WFL for tasks assessed/performed Overall Cognitive Status: History of cognitive impairments - at baseline                                 General Comments: Oriented to self and thought she was at [facility] but not sure why.  Very upset that her son was not there requiring max redirection to tasks.        Exercises      Shoulder Instructions       General Comments Pt limited by decreased cognition combined with decreased balance which makes pt a safety and fall risk.    Pertinent Vitals/ Pain       Pain Assessment Pain Assessment: Faces Faces Pain Scale: Hurts little more Pain Location: back Pain Descriptors / Indicators: Aching, Grimacing Pain Intervention(s): Limited activity within patient's tolerance, Repositioned, Monitored during session  Home Living                                          Prior Functioning/Environment              Frequency  Min 2X/week        Progress Toward Goals  OT Goals(current goals can now be found in the care plan section)  Progress towards OT goals: Progressing toward goals  Acute Rehab OT Goals OT Goal Formulation: Patient unable to participate in goal setting Time For Goal Achievement: 01/26/22 Potential to Achieve Goals: Fair ADL Goals Pt Will Perform Lower Body Dressing: with min assist;sit to/from stand Pt Will Transfer to Toilet: with  supervision;ambulating;regular height toilet;grab bars Pt Will Perform Toileting - Clothing Manipulation and hygiene: with supervision;sit to/from stand Additional ADL Goal #1: Patient will stand at sink to perform grooming task as evidence of improving activity tolerance  Plan Discharge plan remains appropriate    Co-evaluation                 AM-PAC OT "6 Clicks" Daily Activity     Outcome Measure   Help from another person eating meals?: A Little Help from another person taking care of personal grooming?: A Little Help from another person toileting, which includes using toliet, bedpan, or urinal?: A Lot Help from another person bathing (including washing, rinsing, drying)?: A Little Help from another person to put on and taking off regular upper body clothing?: A Little Help from another person to put on and taking off regular lower body clothing?: A Lot 6 Click Score: 16    End of Session Equipment Utilized  During Treatment: Rolling walker (2 wheels);Gait belt  OT Visit Diagnosis: Muscle weakness (generalized) (M62.81);Other symptoms and signs involving cognitive function   Activity Tolerance Patient tolerated treatment well   Patient Left in bed;with call bell/phone within reach;with bed alarm set;with nursing/sitter in room   Nurse Communication Mobility status        Time: 4765-4650 OT Time Calculation (min): 24 min  Charges: OT General Charges $OT Visit: 1 Visit OT Treatments $Self Care/Home Management : 23-37 mins   Glenford Peers 01/15/2022, 1:19 PM

## 2022-01-15 NOTE — Progress Notes (Signed)
Physical Therapy Treatment Patient Details Name: Tiffany [name] MRN: [id number] DOB: Aug 09, 1926 Today's Date: 01/15/2022   History of Present Illness 88 y.o. female with medical history significant of AKI, atypical mole, basal cell carcinoma, breast cancer, colon polyps, hypertension, hypothyroidism, osteoporosis, rectal bleeding, rhabdomyolysis, history of altered nonhemorrhagic CVA, history of multiple UTIs, dementia who is brought to the emergency department by her son due to worsening dementia over the last few days.  He stated this happens when she gets a UTI.  She has also been very weak and has had a few falls over the last month and a half.    PT Comments    Pt agreeable to ambulate however when attempting to get to EOB, pt reported being cold and did not complete bed mobility.  Attempted this 3 times however pt returned to supine and started covering up with blankets.      Recommendations for follow up therapy are one component of a multi-disciplinary discharge planning process, led by the attending physician.  Recommendations may be updated based on patient status, additional functional criteria and insurance authorization.  Follow Up Recommendations  Skilled nursing-short term rehab (<3 hours/day) Can patient physically be transported by private vehicle: Yes   Assistance Recommended at Discharge Intermittent Supervision/Assistance  Patient can return home with the following A little help with walking and/or transfers;A little help with bathing/dressing/bathroom;Assistance with cooking/housework;Direct supervision/assist for medications management;Assist for transportation;Help with stairs or ramp for entrance   Equipment Recommendations  None recommended by PT    Recommendations for Other Services       Precautions / Restrictions Precautions Precautions: Fall Restrictions Weight Bearing Restrictions: No     Mobility  Bed Mobility Overal bed mobility: Needs  Assistance Bed Mobility: Supine to Sit, Sit to Supine     Supine to sit: Supervision Sit to supine: Supervision   General bed mobility comments: pt sitting trunk upright and bringing LEs partly over EOB however stopped, returned to supine and reported she was too cold, performed this 3 times; pt wished to remain under blankets despite encouragement to [name]    Transfers                        Ambulation/Gait                   Stairs             Wheelchair Mobility    Modified Rankin (Stroke Patients Only)       Balance                                            Cognition Arousal/Alertness: Awake/alert Behavior During Therapy: WFL for tasks assessed/performed Overall Cognitive Status: History of cognitive impairments - at baseline                                 General Comments: asking multiple times for Tiffany [name], also reported being cold which limited session        Exercises      General Comments General comments (skin integrity, edema, etc.): Pt limited by decreased cognition combined with decreased balance which makes pt a safety and fall risk.      Pertinent Vitals/Pain Pain Assessment Pain Assessment: Faces Faces Pain Scale: No hurt Pain Intervention(s): Monitored  during session, Repositioned    Home Living                          Prior Function            PT Goals (current goals can now be found in the care plan section) Progress towards PT goals: Progressing toward goals    Frequency    Min 2X/week      PT Plan Current plan remains appropriate    Co-evaluation              AM-PAC PT "6 Clicks" Mobility   Outcome Measure  Help needed turning from your back to your side while in a flat bed without using bedrails?: A Little Help needed moving from lying on your back to sitting on the side of a flat bed without using bedrails?: A Lot Help needed moving  to and from a bed to a chair (including a wheelchair)?: A Little Help needed standing up from a chair using your arms (e.g., wheelchair or bedside chair)?: A Little Help needed to walk in hospital room?: A Lot Help needed climbing 3-5 steps with a railing? : Total 6 Click Score: 14    End of Session   Activity Tolerance: Patient limited by fatigue Patient left: in bed;with call bell/phone within reach;with bed alarm set   PT Visit Diagnosis: Difficulty in walking, not elsewhere classified (R26.2)     Time: 1696-7893 PT Time Calculation (min) (ACUTE ONLY): 8 min  Charges:  $Therapeutic Activity: 8-22 mins                     Jannette Spanner PT, DPT Physical Therapist Acute Rehabilitation Services Preferred contact method: Secure Chat Weekend Pager Only: [phone] Office: [phone]    Myrtis Hopping [place] 01/15/2022, 4:20 PM

## 2022-01-16 DIAGNOSIS — N390 Urinary tract infection, site not specified: Secondary | ICD-10-CM | POA: Diagnosis not present

## 2022-01-16 MED ORDER — POLYETHYLENE GLYCOL 3350 17 G PO PACK
17.0000 g | PACK | Freq: Every day | ORAL | Status: DC
  Administered 2022-01-16 – 2022-01-21 (×6): 17 g via ORAL
  Filled 2022-01-16 (×6): qty 1

## 2022-01-16 NOTE — Plan of Care (Signed)
Pt c/o pain 6/10 in hands bilaterally.  PRNs administered per MAR.  Problem: Education: Goal: Knowledge of General Education information will improve Description: Including pain rating scale, medication(s)/side effects and non-pharmacologic comfort measures Outcome: Progressing   Problem: Health Behavior/Discharge Planning: Goal: Ability to manage health-related needs will improve Outcome: Progressing   Problem: Clinical Measurements: Goal: Ability to maintain clinical measurements within normal limits will improve Outcome: Progressing Goal: Will remain free from infection Outcome: Progressing Goal: Diagnostic test results will improve Outcome: Progressing Goal: Respiratory complications will improve Outcome: Progressing Goal: Cardiovascular complication will be avoided Outcome: Progressing   Problem: Activity: Goal: Risk for activity intolerance will decrease Outcome: Progressing   Problem: Nutrition: Goal: Adequate nutrition will be maintained Outcome: Progressing   Problem: Coping: Goal: Level of anxiety will decrease Outcome: Progressing   Problem: Elimination: Goal: Will not experience complications related to bowel motility Outcome: Progressing Goal: Will not experience complications related to urinary retention Outcome: Progressing   Problem: Pain Managment: Goal: General experience of comfort will improve Outcome: Progressing   Problem: Safety: Goal: Ability to remain free from injury will improve Outcome: Progressing   Problem: Skin Integrity: Goal: Risk for impaired skin integrity will decrease Outcome: Progressing   Problem: Education: Goal: Knowledge of risk factors and measures for prevention of condition will improve Outcome: Progressing   Problem: Coping: Goal: Psychosocial and spiritual needs will be supported Outcome: Progressing   Problem: Respiratory: Goal: Will maintain a patent airway Outcome: Progressing Goal: Complications related  to the disease process, condition or treatment will be avoided or minimized Outcome: Progressing

## 2022-01-16 NOTE — Progress Notes (Signed)
PROGRESS NOTE Tiffany [name]  ZDG:[id number] DOB: February 28, 1927 DOA: 01/11/2022 PCP: Donald Prose, MD   Brief Narrative/Hospital Course: 88 year old with multiple issues, history of multiple UTIs and dementia brought to the ER by her son with worsening mental status and suspected UTI.  In the emergency room hemodynamically stable, abnormal urinalysis.  Treated symptomatically.   Found significantly debilitated needing a SNF for rehab.    Subjective:  Patient seen and examined.  Pleasant.  No complaints.  Assessment and Plan: Principal Problem:   Acute UTI (urinary tract infection) Active Problems:   Acute metabolic encephalopathy   CKD (chronic kidney disease) stage 3, GFR 30-59 ml/min (HCC)   Hypothyroidism   Pressure injury of skin   Frequent falls   Hypertensive urgency   Sinus bradycardia   Protein-calorie malnutrition, severe   Acute metabolic encephalopathy in a patient with underlying dementia: Mental status worsening from baseline in the setting of UTI with background dementia.At this time mentation much improved close to baseline continue UTI treatment supportive care, fall precaution. Plan is for skilled nursing facility.  Stable for discharge.  Positive D dimer 1.6: Duplex upper extremities negative.  VQ scan low possibility.     UTI: Urine culture negative,on ceftriaxone-completed antibiotic therapy.  Hypertension urgency BP is soft this am- beta-blocker held du to bradycardia on admission and-was started on amlodipine 2.5 - will D/C amlodipine-continue Avapro monitor.  Sinus bradycardia asymptomatic : Discontinue metoprolol.  CKD stage IIIb stable  Hypothyroidism continue Synthroid.  Frequent fall:PT OT and skilled nursing facility requested for PTOT.  Goals of care DNR currently enrolled in outpatient palliative care.Seen by palliative care here.  Severe Malnutrition: weight loss 7.6 kg since July dietitian FOLLOWING Nutrition Problem: Severe  Malnutrition Etiology: chronic illness (dementia) Signs/Symptoms: severe fat depletion, severe muscle depletion, energy intake < or equal to 75% for > or equal to 1 month Interventions: Boost Plus, Liberalize Diet  DVT prophylaxis: enoxaparin (LOVENOX) injection 30 mg Start: 01/11/22 2200 Code Status:   Code Status: DNR Family Communication: None at the bedside.  Patient status is: inapatient for management of acute metabolic encephalopathy  Level of care: Telemetry  Dispo: The patient is from:home.            Anticipated disposition:SNF once bed available.She is medically stable  Mobility Assessment (last 72 hours)     Mobility Assessment     Row Name 01/15/22 1613 01/15/22 1300 01/15/22 0845 01/14/22 1100     Does patient have an order for bedrest or is patient medically unstable -- -- No - Continue assessment No - Continue assessment    What is the highest level of mobility based on the progressive mobility assessment? Level 5 (Walks with assist in room/hall) - Balance while stepping forward/back and can walk in room with assist - Complete Level 5 (Walks with assist in room/hall) - Balance while stepping forward/back and can walk in room with assist - Complete Level 5 (Walks with assist in room/hall) - Balance while stepping forward/back and can walk in room with assist - Complete Level 5 (Walks with assist in room/hall) - Balance while stepping forward/back and can walk in room with assist - Complete    Is the above level different from baseline mobility prior to current illness? -- -- Yes - Recommend PT order Yes - Recommend PT order              Objective: Vitals last 24 hrs: Vitals:   01/15/22 0959 01/15/22 1409 01/15/22 2218 01/16/22 0554  BP: Marland Kitchen)  119/49 (!) 151/60 (!) 158/54 (!) 153/75  Pulse:  72 76 75  Resp:  '16 20 18  '$ Temp:  98.5 F (36.9 C) 98.7 F (37.1 C) 98.7 F (37.1 C)  TempSrc:  Oral Oral Oral  SpO2:  96% 94% 95%   Weight change:   Physical  Examination:  General: Pleasant.  Alert oriented x2-3.  She knows she is in the hospital.  She is aware about her circumstances.  Able to keep up conversation. Cardiovascular: S1 and S2 normal.  Regular rate rhythm. Respiratory: Bilateral clear.  No added sounds. Gastrointestinal: Soft.  Nontender.  Bowel sounds present. Ext: No edema or swelling.  No cyanosis. Neuro: Equal strength in all extremities. Musculoskeletal: No deformities. Skin: Intact.    Medications reviewed:  Scheduled Meds:  vitamin C  500 mg Oral Daily   enoxaparin (LOVENOX) injection  30 mg Subcutaneous Q24H   irbesartan  300 mg Oral q morning   lactose free nutrition  237 mL Oral BID BM   levothyroxine  75 mcg Oral Q0600   nystatin   Topical TID   zinc sulfate  220 mg Oral Daily   Continuous Infusions:   Diet Order             Diet regular Room service appropriate? No; Fluid consistency: Thin  Diet effective now                  Intake/Output Summary (Last 24 hours) at 01/16/2022 1134 Last data filed at 01/16/2022 0600 Gross per 24 hour  Intake 80 ml  Output 150 ml  Net -70 ml    Net IO Since Admission: -567 mL [01/16/22 1134]  Wt Readings from Last 3 Encounters:  11/21/21 66.7 kg  10/02/20 67.1 kg  02/02/19 59 kg    Find [employer] (From admission, onward)     Start     Ordered   01/18/22 0500  Creatinine, serum  (enoxaparin (LOVENOX)    CrCl >/= 30 ml/min)  Weekly,   R     Comments: while on enoxaparin therapy    01/11/22 1602          Data Reviewed: I have personally reviewed following labs and imaging studies CBC: Recent Labs  Lab 01/11/22 1338  WBC 4.9  NEUTROABS 3.3  HGB 11.7*  HCT 37.8  MCV 102.4*  PLT 140*    Basic Metabolic Panel: Recent Labs  Lab 01/11/22 1338  NA 139  K 4.2  CL 110  CO2 25  GLUCOSE 138*  BUN 25*  CREATININE 1.17*  CALCIUM 9.2    GFR: CrCl cannot be calculated (Unknown ideal weight.). Liver Function Tests: Recent Labs  Lab  01/11/22 1338  AST 14*  ALT 9  ALKPHOS 53  BILITOT 0.7  PROT 5.9*  ALBUMIN 3.3*    Thyroid Function Tests: No results for input(s): "TSH", "T4TOTAL", "FREET4", "T3FREE", "THYROIDAB" in the last 72 hours.  Sepsis Labs: Recent Labs  Lab 01/11/22 1338 01/11/22 1607  LATICACIDVEN 1.0 1.1     Antimicrobials: Anti-infectives (From admission, onward)    Start     Dose/Rate Route Frequency Ordered Stop   01/12/22 1600  cefTRIAXone (ROCEPHIN) 1 g in sodium chloride 0.9 % 100 mL IVPB        1 g 200 mL/hr over 30 Minutes Intravenous Every 24 hours 01/11/22 1815 01/15/22 1810   01/11/22 2200  molnupiravir EUA (LAGEVRIO) capsule 800 mg  Status:  Discontinued        4  capsule Oral 2 times daily 01/11/22 1615 01/11/22 1617   01/11/22 1800  nirmatrelvir/ritonavir EUA (renal dosing) (PAXLOVID) 2 tablet  Status:  Discontinued        2 tablet Oral 2 times daily 01/11/22 1617 01/11/22 1735   01/11/22 1600  cefTRIAXone (ROCEPHIN) 1 g in sodium chloride 0.9 % 100 mL IVPB        1 g 200 mL/hr over 30 Minutes Intravenous  Once 01/11/22 1546 01/11/22 1635      Culture/Microbiology    Component Value Date/Time   SDES  01/11/2022 1458    IN/OUT CATH URINE Performed at [facility], [place] [address], [place], [place] [address]    SPECREQUEST  01/11/2022 1458    NONE Performed at [facility], [place] [address], [place], [place] [address]    CULT  01/11/2022 1458    NO GROWTH Performed at [facility] [address] [place], [place] [address]    REPTSTATUS 01/13/2022 FINAL 01/11/2022 1458    Other culture-see note  Radiology Studies: NM Pulmonary Perfusion  Result Date: 01/14/2022 CLINICAL DATA:  PE suspected EXAM: NUCLEAR MEDICINE PERFUSION LUNG SCAN TECHNIQUE: Perfusion images were obtained in multiple projections after intravenous injection of radiopharmaceutical. Ventilation scans intentionally deferred if perfusion scan and chest x-ray  adequate for interpretation during COVID 19 epidemic. RADIOPHARMACEUTICALS:  4.4 mCi Tc-1mMAA IV COMPARISON:  Same-day chest radiograph FINDINGS: Normal, homogeneous bilateral pulmonary perfusion. No suspicious perfusion defects. Cardiomegaly. IMPRESSION: Very low probability for pulmonary embolism by modified perfusion only PIOPED criteria (PE absent). Electronically Signed   By: ADelanna AhmadiM.D.   On: 01/14/2022 14:42   DG Chest Port 1 View  Result Date: 01/14/2022 CLINICAL DATA:  Shortness of breath EXAM: PORTABLE CHEST 1 VIEW COMPARISON:  11/21/2021 FINDINGS: Cardiomegaly. Mild, diffuse bilateral interstitial pulmonary opacity. Unchanged elevation of the left hemidiaphragm. Possible small bilateral pleural effusions. The visualized skeletal structures are unremarkable. IMPRESSION: 1. Cardiomegaly with mild, diffuse bilateral interstitial pulmonary opacity, likely edema. No focal airspace opacity. 2.  Possible small bilateral pleural effusions. Electronically Signed   By: ADelanna AhmadiM.D.   On: 01/14/2022 14:41     LOS: 4 days   KBarb Merino MD [facility]  01/16/2022, 11:34 AM

## 2022-01-16 NOTE — Progress Notes (Signed)
Occupational Therapy Treatment Patient Details Name: Tiffany [name] MRN: [id number] DOB: 05-Apr-1927 Today's Date: 01/16/2022   History of present illness 88 y.o. female with medical history significant of AKI, atypical mole, basal cell carcinoma, breast cancer, colon polyps, hypertension, hypothyroidism, osteoporosis, rectal bleeding, rhabdomyolysis, history of altered nonhemorrhagic CVA, history of multiple UTIs, dementia who is brought to the emergency department by her son due to worsening dementia over the last few days.  He stated this happens when she gets a UTI.  She has also been very weak and has had a few falls over the last month and a half.   OT comments  Pt was found in bed and she agreed to participate in the therapy session, after some light encouragement. She required mod-max assist with increased time and effort for bed mobility, including supine to sit, scooting to EOB, and sit to supine. She presented with poor sitting balance EOB, requiring increased assist & cues to correct consistent posterior leaning. She was able to stand using a RW, requiring mod assist, however her standing tolerance was poor, as she soon requested to return to sitting. She further required mod assist for upper body dressing seated EOB. She reported having back and bilateral hand pain, and she indicated her hand pain was acute in nature. Her overall activity tolerance was compromised. She will benefit from further OT services to facilitate improved ADL performance and to decrease the risk for further weakness & deconditioning.    Recommendations for follow up therapy are one component of a multi-disciplinary discharge planning process, led by the attending physician.  Recommendations may be updated based on patient status, additional functional criteria and insurance authorization.    Follow Up Recommendations  Skilled nursing-short term rehab (<3 hours/day)    Assistance Recommended at Discharge Frequent  or constant Supervision/Assistance  Patient can return home with the following  A lot of help with bathing/dressing/bathroom;Assistance with cooking/housework;Direct supervision/assist for medications management;Assist for transportation            Precautions / Restrictions Precautions Precautions: Fall Restrictions Weight Bearing Restrictions: No       Mobility Bed Mobility Overal bed mobility: Needs Assistance Bed Mobility: Supine to Sit, Sit to Supine     Supine to sit: Mod assist Sit to supine: Mod assist Scooting to head of bed: Max assist    General bed mobility comments: Pt required increased time and effort for all bed mobility, with increased cues for general sequencing/transfer technique(s).    Transfers Overall transfer level: Needs assistance Equipment used: Rolling walker (2 wheels) Transfers: Sit to/from Stand Sit to Stand: Mod assist           General transfer comment: Pt required cues for hand placement, cues to correct posterior leaning, and cues to push with B LE, in order to perform sit to stand.     Balance Overall balance assessment: Needs assistance   Sitting balance-Leahy Scale: Poor       Standing balance-Leahy Scale: Poor           ADL either performed or assessed with clinical judgement   ADL   Eating/Feeding: Set up Eating/Feeding Details (indicate cue type and reason): Patient initiated self-feeding tasks in the near high fowler's position in bed. Grooming: Wash/dry face;Minimal assistance Grooming Details (indicate cue type and reason): She performed face washing seated EOB, requiring min assist for balance/steadying, as she presented with posterior leaning.         Upper Body Dressing : Moderate assistance;Sitting Upper Body  Dressing Details (indicate cue type and reason): Patient required mod assist for donning a hospital gown seated EOB. She required cues for sequencing, as well as assist to correct posterior  leaning. Lower Body Dressing: Maximal assistance                                 Cognition Arousal/Alertness: Awake/alert                                                    General Comments  Patient reported acute bilateral hand pain & sensitivity today.     Pertinent Vitals/ Pain       Pain Assessment Pain Assessment: Faces Faces Pain Scale: Hurts little more Pain Location: Pt reported having pain in her back and hands. Pain Intervention(s): Limited activity within patient's tolerance         Frequency  Min 2X/week          OT Goals(current goals can now be found in the care plan section)     Acute Rehab OT Goals OT Goal Formulation: Patient unable to participate in goal setting Time For Goal Achievement: 01/26/22 Potential to Achieve Goals: [place] Discharge plan remains appropriate       AM-PAC OT "6 Clicks" Daily Activity     Outcome Measure   Help from another person eating meals?: A Little Help from another person taking care of personal grooming?: A Little Help from another person toileting, which includes using toliet, bedpan, or urinal?: A Lot Help from another person bathing (including washing, rinsing, drying)?: A Lot Help from another person to put on and taking off regular upper body clothing?: A Lot Help from another person to put on and taking off regular lower body clothing?: A Lot 6 Click Score: 14    End of Session Equipment Utilized During Treatment: Rolling walker (2 wheels)  OT Visit Diagnosis: Muscle weakness (generalized) (M62.81);Other symptoms and signs involving cognitive function   Activity Tolerance Patient limited by pain   Patient Left in bed;with call bell/phone within reach;with bed alarm set   Nurse Communication  (Patient's nurse cleared her for participation in the session.)        Time: [phone] OT Time Calculation (min): 30 min  Charges: OT General Charges $OT Visit: 1  Visit OT Treatments $Self Care/Home Management : 8-22 mins $Therapeutic Activity: 8-22 mins    Leota Sauers, OTR/L 01/16/2022, 12:11 PM

## 2022-01-17 DIAGNOSIS — N390 Urinary tract infection, site not specified: Secondary | ICD-10-CM | POA: Diagnosis not present

## 2022-01-17 NOTE — Care Management Important Message (Signed)
Important Message  Patient Details IM Letter given to the Patient. Name: Tiffany [name] MRN: [id number] Date of Birth: 04/03/1927   Medicare Important Message Given:  Yes     Kerin Salen 01/17/2022, 9:45 AM

## 2022-01-17 NOTE — TOC Progression Note (Signed)
Transition of Care [facility]) - Progression Note    Patient Details  Name: Christen Wardrop MRN: [id number] Date of Birth: [birth year]  [facility] CM/SW Contact  Leeroy Cha, RN Phone Number: 01/17/2022, 9:55 AM  Clinical Narrative:    Pt ot and progress notes faxed to [facility] at [facility] [place]/per request from [place].        Expected Discharge Plan and Services                                                 Social Determinants of Health (SDOH) Interventions    Readmission Risk Interventions   No data to display

## 2022-01-17 NOTE — Progress Notes (Signed)
PROGRESS NOTE Tiffany [name]  BTD:[id number] DOB: Mar 16, 1927 DOA: 01/11/2022 PCP: Donald Prose, MD   Brief Narrative/Hospital Course: 89 year old with multiple issues, history of multiple UTIs and dementia brought to the ER by her son with worsening mental status and suspected UTI.  In the emergency room hemodynamically stable, abnormal urinalysis.  Treated symptomatically.   Found significantly debilitated needing a SNF for rehab.    Subjective:  Patient seen and examined.  No overnight events.  Patient was wondering about her son.  Assessment and Plan: Principal Problem:   Acute UTI (urinary tract infection) Active Problems:   Acute metabolic encephalopathy   CKD (chronic kidney disease) stage 3, GFR 30-59 ml/min (HCC)   Hypothyroidism   Pressure injury of skin   Frequent falls   Hypertensive urgency   Sinus bradycardia   Protein-calorie malnutrition, severe   Acute metabolic encephalopathy in a patient with underlying dementia: Mental status worsening from baseline in the setting of UTI with background dementia.At this time mentation much improved close to baseline continue UTI treatment supportive care, fall precaution. Plan is for skilled nursing facility.  Stable for discharge.  Positive D dimer 1.6: Duplex upper extremities negative.  VQ scan low possibility.     UTI: Urine culture negative,on ceftriaxone-completed antibiotic therapy.  Hypertension urgency: Stable now.  Sinus bradycardia asymptomatic : Discontinue metoprolol.  CKD stage IIIb stable  Hypothyroidism continue Synthroid.  Frequent fall:PT OT and skilled nursing facility requested for PTOT.  Goals of care DNR currently enrolled in outpatient palliative care.Seen by palliative care here.  Severe Malnutrition: weight loss 7.6 kg since July dietitian FOLLOWING Nutrition Problem: Severe Malnutrition Etiology: chronic illness (dementia) Signs/Symptoms: severe fat depletion, severe muscle depletion, energy  intake < or equal to 75% for > or equal to 1 month Interventions: Boost Plus, Liberalize Diet  DVT prophylaxis: enoxaparin (LOVENOX) injection 30 mg Start: 01/11/22 2200 Code Status:   Code Status: DNR Family Communication: None at the bedside.  Patient status is: inapatient, waiting for SNF bed. Level of care: Telemetry  Dispo: The patient is from:home.            Anticipated disposition:SNF once bed available.She is medically stable  Mobility Assessment (last 72 hours)     Mobility Assessment     Row Name 01/16/22 1955 01/16/22 1000 01/16/22 0905 01/15/22 1613 01/15/22 1300   Does patient have an order for bedrest or is patient medically unstable No - Continue assessment No - Continue assessment -- -- --   What is the highest level of mobility based on the progressive mobility assessment? Level 5 (Walks with assist in room/hall) - Balance while stepping forward/back and can walk in room with assist - Complete Level 5 (Walks with assist in room/hall) - Balance while stepping forward/back and can walk in room with assist - Complete Level 1 (Bedfast) - Unable to balance while sitting on edge of bed Level 5 (Walks with assist in room/hall) - Balance while stepping forward/back and can walk in room with assist - Complete Level 5 (Walks with assist in room/hall) - Balance while stepping forward/back and can walk in room with assist - Complete   Is the above level different from baseline mobility prior to current illness? Yes - Recommend PT order -- -- -- --    [place] Name 01/15/22 0845 01/14/22 1100         Does patient have an order for bedrest or is patient medically unstable No - Continue assessment No - Continue assessment  What is the highest level of mobility based on the progressive mobility assessment? Level 5 (Walks with assist in room/hall) - Balance while stepping forward/back and can walk in room with assist - Complete Level 5 (Walks with assist in room/hall) - Balance while  stepping forward/back and can walk in room with assist - Complete      Is the above level different from baseline mobility prior to current illness? Yes - Recommend PT order Yes - Recommend PT order                Objective: Vitals last 24 hrs: Vitals:   01/16/22 0554 01/16/22 1405 01/16/22 2101 01/17/22 0624  BP: (!) 153/75 (!) 150/52 (!) 149/68 (!) 159/94  Pulse: 75 (!) 106 88 76  Resp: '18 16 18 18  '$ Temp: 98.7 F (37.1 C) 97.9 F (36.6 C) 98.4 F (36.9 C) 98.4 F (36.9 C)  TempSrc: Oral Oral Oral Oral  SpO2: 95% 97% 96% 96%   Weight change:   Physical Examination:  Pleasant and conversant.  No new findings.    Medications reviewed:  Scheduled Meds:  vitamin C  500 mg Oral Daily   enoxaparin (LOVENOX) injection  30 mg Subcutaneous Q24H   irbesartan  300 mg Oral q morning   lactose free nutrition  237 mL Oral BID BM   levothyroxine  75 mcg Oral Q0600   nystatin   Topical TID   polyethylene glycol  17 g Oral Daily   zinc sulfate  220 mg Oral Daily   Continuous Infusions:   Diet Order             Diet regular Room service appropriate? No; Fluid consistency: Thin  Diet effective now                  Intake/Output Summary (Last 24 hours) at 01/17/2022 1056 Last data filed at 01/17/2022 1032 Gross per 24 hour  Intake 240 ml  Output 100 ml  Net 140 ml    Net IO Since Admission: -187 mL [01/17/22 1056]  Wt Readings from Last 3 Encounters:  11/21/21 66.7 kg  10/02/20 67.1 kg  02/02/19 59 kg    Find [employer] (From admission, onward)     Start     Ordered   01/18/22 0500  Creatinine, serum  (enoxaparin (LOVENOX)    CrCl >/= 30 ml/min)  Weekly,   R     Comments: while on enoxaparin therapy    01/11/22 1602          Data Reviewed: I have personally reviewed following labs and imaging studies CBC: Recent Labs  Lab 01/11/22 1338  WBC 4.9  NEUTROABS 3.3  HGB 11.7*  HCT 37.8  MCV 102.4*  PLT 140*    Basic Metabolic Panel: Recent  Labs  Lab 01/11/22 1338  NA 139  K 4.2  CL 110  CO2 25  GLUCOSE 138*  BUN 25*  CREATININE 1.17*  CALCIUM 9.2    GFR: CrCl cannot be calculated (Unknown ideal weight.). Liver Function Tests: Recent Labs  Lab 01/11/22 1338  AST 14*  ALT 9  ALKPHOS 53  BILITOT 0.7  PROT 5.9*  ALBUMIN 3.3*    Thyroid Function Tests: No results for input(s): "TSH", "T4TOTAL", "FREET4", "T3FREE", "THYROIDAB" in the last 72 hours.  Sepsis Labs: Recent Labs  Lab 01/11/22 1338 01/11/22 1607  LATICACIDVEN 1.0 1.1     Antimicrobials: Anti-infectives (From admission, onward)    Start     Dose/Rate  Route Frequency Ordered Stop   01/12/22 1600  cefTRIAXone (ROCEPHIN) 1 g in sodium chloride 0.9 % 100 mL IVPB        1 g 200 mL/hr over 30 Minutes Intravenous Every 24 hours 01/11/22 1815 01/15/22 1810   01/11/22 2200  molnupiravir EUA (LAGEVRIO) capsule 800 mg  Status:  Discontinued        4 capsule Oral 2 times daily 01/11/22 1615 01/11/22 1617   01/11/22 1800  nirmatrelvir/ritonavir EUA (renal dosing) (PAXLOVID) 2 tablet  Status:  Discontinued        2 tablet Oral 2 times daily 01/11/22 1617 01/11/22 1735   01/11/22 1600  cefTRIAXone (ROCEPHIN) 1 g in sodium chloride 0.9 % 100 mL IVPB        1 g 200 mL/hr over 30 Minutes Intravenous  Once 01/11/22 1546 01/11/22 1635      Culture/Microbiology    Component Value Date/Time   SDES  01/11/2022 1458    IN/OUT CATH URINE Performed at [facility], [place] [address] [place], [place] [address]    SPECREQUEST  01/11/2022 1458    NONE Performed at [facility], [place] [address], [place], [place] [address]    CULT  01/11/2022 1458    NO GROWTH Performed at [facility] [address] [place], [place] [address]    REPTSTATUS 01/13/2022 FINAL 01/11/2022 1458    Other culture-see note  Radiology Studies: No results found.   LOS: 5 days   Barb Merino, MD [facility]  01/17/2022, 10:56  AM

## 2022-01-18 ENCOUNTER — Other Ambulatory Visit: Payer: Self-pay

## 2022-01-18 DIAGNOSIS — N390 Urinary tract infection, site not specified: Secondary | ICD-10-CM | POA: Diagnosis not present

## 2022-01-18 LAB — CREATININE, SERUM
Creatinine, Ser: 0.95 mg/dL (ref 0.44–1.00)
GFR, Estimated: 56 mL/min — ABNORMAL LOW (ref >60)

## 2022-01-18 NOTE — Progress Notes (Signed)
Physical Therapy Treatment Patient Details Name: Tiffany [name] MRN: [id number] DOB: [birth year] Today's Date: 01/18/2022   History of Present Illness 88 y.o. female with medical history significant of AKI, atypical mole, basal cell carcinoma, breast cancer, colon polyps, hypertension, hypothyroidism, osteoporosis, rectal bleeding, rhabdomyolysis, history of altered nonhemorrhagic CVA, history of multiple UTIs, dementia who is brought to the emergency department by her son due to worsening dementia over the last few days.  He stated this happens when she gets a UTI.  She has also been very weak and has had a few falls over the last month and a half.    PT Comments    Patient agreeable to mobilize with PT with some encouragement. Pt required Mod assist to move to EOB and maintain seated balance. Patient requires min-mod assist for power up from various surfaces and min assist for gait to steady walker and provide manual cues to improve posture. EOS NT in room and assisting with self care in bathroom. Continue to recommend ST rehab at [facility]. Will progress as able.    Recommendations for follow up therapy are one component of a multi-disciplinary discharge planning process, led by the attending physician.  Recommendations may be updated based on patient status, additional functional criteria and insurance authorization.  Follow Up Recommendations  Skilled nursing-short term rehab (<3 hours/day) Can patient physically be transported by private vehicle: Yes   Assistance Recommended at Discharge Intermittent Supervision/Assistance  Patient can return home with the following A little help with walking and/or transfers;A little help with bathing/dressing/bathroom;Assistance with cooking/housework;Direct supervision/assist for medications management;Assist for transportation;Help with stairs or ramp for entrance   Equipment Recommendations  None recommended by PT    Recommendations for Other Services        Precautions / Restrictions Precautions Precautions: Fall Restrictions Weight Bearing Restrictions: No     Mobility  Bed Mobility Overal bed mobility: Needs Assistance Bed Mobility: Supine to Sit     Supine to sit: Mod assist     General bed mobility comments: mod assist and cues to initiate bring LE's off EOB and to raise trunk upright. pt leaning posteriorly at EOB and Mod assist to scoot forward with intermittent cues for anterior trunk lean.    Transfers Overall transfer level: Needs assistance Equipment used: Rolling walker (2 wheels) Transfers: Sit to/from Stand, Bed to chair/wheelchair/BSC Sit to Stand: Mod assist, Min assist   Step pivot transfers: Min assist, Mod assist       General transfer comment: EOB elevated and min assist to steady with rise from high surface. Mod assist required for power up from low reclier and toilet height with cues for hand placement.    Ambulation/Gait Ambulation/Gait assistance: Min assist Gait Distance (Feet): 15 Feet Assistive device: Rolling walker (2 wheels) Gait Pattern/deviations: Step-to pattern, Decreased stride length, Decreased step length - left, Decreased step length - right, Knee flexed in stance - right, Knee flexed in stance - left, Shuffle, Narrow base of support, Trunk flexed Gait velocity: decr     General Gait Details: VC's to maintain safe position to RW and assist to steady balance. pt's knees flexed in stance and crouched posture secondary to weakness and kyphosis.   Stairs             Wheelchair Mobility    Modified Rankin (Stroke Patients Only)       Balance Overall balance assessment: Needs assistance Sitting-balance support: Bilateral upper extremity supported Sitting balance-Leahy Scale: Poor Sitting balance - Comments: posterior lean in  sitting Postural control: Posterior lean Standing balance support: During functional activity, Reliant on assistive device for balance Standing  balance-Leahy Scale: Poor Standing balance comment: reliant on RW                            Cognition Arousal/Alertness: Awake/alert Behavior During Therapy: WFL for tasks assessed/performed Overall Cognitive Status: History of cognitive impairments - at baseline                                 General Comments: asking multiple times for [name], also reported being cold multiple times, easily redirected and pleasant.        Exercises      General Comments        Pertinent Vitals/Pain Pain Assessment Pain Assessment: No/denies pain Pain Intervention(s): Monitored during session, Repositioned    Home Living                          Prior Function            PT Goals (current goals can now be found in the care plan section) Acute Rehab PT Goals PT Goal Formulation: Patient unable to participate in goal setting Time For Goal Achievement: 01/26/22 Potential to Achieve Goals: Fair Progress towards PT goals: Progressing toward goals    Frequency    Min 2X/week      PT Plan Current plan remains appropriate    Co-evaluation              AM-PAC PT "6 Clicks" Mobility   Outcome Measure  Help needed turning from your back to your side while in a flat bed without using bedrails?: A Little Help needed moving from lying on your back to sitting on the side of a flat bed without using bedrails?: A Little Help needed moving to and from a bed to a chair (including a wheelchair)?: A Lot Help needed standing up from a chair using your arms (e.g., wheelchair or bedside chair)?: A Lot Help needed to walk in hospital room?: A Little Help needed climbing 3-5 steps with a railing? : A Lot 6 Click Score: 15    End of Session Equipment Utilized During Treatment: Gait belt Activity Tolerance: Patient limited by fatigue Patient left: with nursing/sitter in room (in bathroom with NT) Nurse Communication: Mobility status PT Visit Diagnosis:  Difficulty in walking, not elsewhere classified (R26.2)     Time: 4401-0272 PT Time Calculation (min) (ACUTE ONLY): 21 min  Charges:  $Gait Training: 8-22 mins                     Verner Mould, DPT Acute Rehabilitation Services Office [phone] Pager [phone]  01/18/22 12:32 PM

## 2022-01-18 NOTE — Plan of Care (Signed)
  Problem: Clinical Measurements: Goal: Diagnostic test results will improve Outcome: Progressing   Problem: Coping: Goal: Level of anxiety will decrease Outcome: Progressing   

## 2022-01-18 NOTE — TOC Progression Note (Signed)
Transition of Care [facility]) - Progression Note    Patient Details  Name: Tiffany [name] MRN: [id number] Date of Birth: Nov 28, 1926  Transition of Care [facility]) CM/SW Contact  Leeroy Cha, RN Phone Number: 01/18/2022, 1:00 PM  Clinical Narrative:    Updated progress and physical therapy notes faxed to christine at [facility] at 1300 per [facility] request.        Expected Discharge Plan and Services                                                 Social Determinants of Health (SDOH) Interventions    Readmission Risk Interventions   No data to display

## 2022-01-18 NOTE — Progress Notes (Signed)
PROGRESS NOTE    Tiffany [name]  JKK:[id number] DOB: 1926-11-18 DOA: 01/11/2022 PCP: Donald Prose, MD    Brief Narrative:  Admitted from independent living apartment, history of dementia.  Admitted with encephalopathy secondary to UTI.   Assessment & Plan:   Acute UTI, completed treatment Acute metabolic encephalopathy in a patient with underlying dementia: Improving. Deconditioning, and need for rehab.  Medically stable.  Transfer to SNF when bed available.   DVT prophylaxis: enoxaparin (LOVENOX) injection 30 mg Start: 01/11/22 2200   Code Status: DNR Family Communication: Son called, unable to talk Disposition Plan: Status is: Inpatient Remains inpatient appropriate because: Unsafe discharge, stable to transfer to skilled level of care     Consultants:  Palliative care  Procedures:  None  Antimicrobials:  Completed   Subjective: Seen and examined.  Pleasant.  Denies any complaints.  Objective: Vitals:   01/17/22 2051 01/17/22 2350 01/18/22 0558 01/18/22 0749  BP: (!) 175/69 (!) 141/52 (!) 164/58 (!) 132/57  Pulse: 97  71 72  Resp: 18  18   Temp: 99 F (37.2 C)  97.8 F (36.6 C) 98.5 F (36.9 C)  TempSrc: Oral   Oral  SpO2: 96%  94%     Intake/Output Summary (Last 24 hours) at 01/18/2022 1152 Last data filed at 01/18/2022 0900 Gross per 24 hour  Intake 720 ml  Output 350 ml  Net 370 ml   There were no vitals filed for this visit.  Examination:  Looks comfortable.  Trying to eat breakfast.    Data Reviewed: I have personally reviewed following labs and imaging studies  CBC: Recent Labs  Lab 01/11/22 1338  WBC 4.9  NEUTROABS 3.3  HGB 11.7*  HCT 37.8  MCV 102.4*  PLT 716*   Basic Metabolic Panel: Recent Labs  Lab 01/11/22 1338 01/18/22 0523  NA 139  --   K 4.2  --   CL 110  --   CO2 25  --   GLUCOSE 138*  --   BUN 25*  --   CREATININE 1.17* 0.95  CALCIUM 9.2  --    GFR: CrCl cannot be calculated (Unknown ideal  weight.). Liver Function Tests: Recent Labs  Lab 01/11/22 1338  AST 14*  ALT 9  ALKPHOS 53  BILITOT 0.7  PROT 5.9*  ALBUMIN 3.3*   No results for input(s): "LIPASE", "AMYLASE" in the last 168 hours. No results for input(s): "AMMONIA" in the last 168 hours. Coagulation Profile: No results for input(s): "INR", "PROTIME" in the last 168 hours. Cardiac Enzymes: No results for input(s): "CKTOTAL", "CKMB", "CKMBINDEX", "TROPONINI" in the last 168 hours. BNP (last 3 results) No results for input(s): "PROBNP" in the last 8760 hours. HbA1C: No results for input(s): "HGBA1C" in the last 72 hours. CBG: No results for input(s): "GLUCAP" in the last 168 hours. Lipid Profile: No results for input(s): "CHOL", "HDL", "LDLCALC", "TRIG", "CHOLHDL", "LDLDIRECT" in the last 72 hours. Thyroid Function Tests: No results for input(s): "TSH", "T4TOTAL", "FREET4", "T3FREE", "THYROIDAB" in the last 72 hours. Anemia Panel: No results for input(s): "VITAMINB12", "FOLATE", "FERRITIN", "TIBC", "IRON", "RETICCTPCT" in the last 72 hours. Sepsis Labs: Recent Labs  Lab 01/11/22 1338 01/11/22 1607  LATICACIDVEN 1.0 1.1    Recent Results (from the past 240 hour(s))  Resp Panel by RT-PCR (Flu A&B, Covid) Anterior Nasal Swab     Status: None   Collection Time: 01/11/22  1:40 PM   Specimen: Anterior Nasal Swab  Result Value Ref Range Status   SARS Coronavirus  2 by RT PCR NEGATIVE NEGATIVE Final    Comment: (NOTE) SARS-CoV-2 target nucleic acids are NOT DETECTED.  The SARS-CoV-2 RNA is generally detectable in upper respiratory specimens during the acute phase of infection. The lowest concentration of SARS-CoV-2 viral copies this assay can detect is 138 copies/mL. A negative result does not preclude SARS-Cov-2 infection and should not be used as the sole basis for treatment or other patient management decisions. A negative result may occur with  improper specimen collection/handling, submission of  specimen other than nasopharyngeal swab, presence of viral mutation(s) within the areas targeted by this assay, and inadequate number of viral copies(<138 copies/mL). A negative result must be combined with clinical observations, patient history, and epidemiological information. The expected result is Negative.  Fact Sheet for Patients:  EntrepreneurPulse.com.au  Fact Sheet for Healthcare Providers:  IncredibleEmployment.be  This test is no t yet approved or cleared by the [place] FDA and  has been authorized for detection and/or diagnosis of SARS-CoV-2 by FDA under an Emergency Use Authorization (EUA). This EUA will remain  in effect (meaning this test can be used) for the duration of the COVID-19 declaration under Section 564(b)(1) of the Act, 21 U.S.C.section 360bbb-3(b)(1), unless the authorization is terminated  or revoked sooner.       Influenza A by PCR NEGATIVE NEGATIVE Final   Influenza B by PCR NEGATIVE NEGATIVE Final    Comment: (NOTE) The Xpert Xpress SARS-CoV-2/FLU/RSV plus assay is intended as an aid in the diagnosis of influenza from Nasopharyngeal swab specimens and should not be used as a sole basis for treatment. Nasal washings and aspirates are unacceptable for Xpert Xpress SARS-CoV-2/FLU/RSV testing.  Fact Sheet for Patients: EntrepreneurPulse.com.au  Fact Sheet for Healthcare Providers: IncredibleEmployment.be  This test is not yet approved or cleared by the [place] FDA and has been authorized for detection and/or diagnosis of SARS-CoV-2 by FDA under an Emergency Use Authorization (EUA). This EUA will remain in effect (meaning this test can be used) for the duration of the COVID-19 declaration under Section 564(b)(1) of the Act, 21 U.S.C. section 360bbb-3(b)(1), unless the authorization is terminated or revoked.  Performed at [facility], [place]  [address], [place], [place] [address]   Urine Culture     Status: None   Collection Time: 01/11/22  2:58 PM   Specimen: In/Out Cath Urine  Result Value Ref Range Status   Specimen Description   Final    IN/OUT CATH URINE Performed at [facility] [address], [place], [place] [address]    Special Requests   Final    NONE Performed at [facility], [place] [address], [place], [place] [address]    Culture   Final    NO GROWTH Performed at [facility], [place] [address] [place], [place] [address]    Report Status 01/13/2022 FINAL  Final         Radiology Studies: No results found.      Scheduled Meds:  vitamin C  500 mg Oral Daily   enoxaparin (LOVENOX) injection  30 mg Subcutaneous Q24H   irbesartan  300 mg Oral q morning   lactose free nutrition  237 mL Oral BID BM   levothyroxine  75 mcg Oral Q0600   nystatin   Topical TID   polyethylene glycol  17 g Oral Daily   zinc sulfate  220 mg Oral Daily   Continuous Infusions:   LOS: 6 days    Time  spent: 25 minutes    Barb Merino, MD [facility] Pager [phone]

## 2022-01-19 DIAGNOSIS — N390 Urinary tract infection, site not specified: Secondary | ICD-10-CM | POA: Diagnosis not present

## 2022-01-19 NOTE — Progress Notes (Signed)
Pt's son call unit to request that patient gets tested for Covid. Son stated that he was not feeling well and he did a home covid test that was inconclusive but"likely negative". He also stressed that he wore masks within the facility.  Son was educated that patient was asymptomatic of any symptoms and vitals were wnl but would notify hospitalist of his concerns. Hospitalis was notifed, no new orders to obtain covid swab.

## 2022-01-19 NOTE — Progress Notes (Signed)
PROGRESS NOTE    Tiffany [name]  IDP:[id number] DOB: [birth year] DOA: 01/11/2022 PCP: Donald Prose, MD    Brief Narrative:  Admitted from independent living apartment, history of dementia.  Admitted with encephalopathy secondary to UTI.   Assessment & Plan:   Acute UTI, completed treatment Acute metabolic encephalopathy in a patient with underlying dementia: Improving. Deconditioning, and need for rehab.  Medically stable.  Transfer to SNF when bed available.   DVT prophylaxis: enoxaparin (LOVENOX) injection 30 mg Start: 01/11/22 2200   Code Status: DNR Family Communication: None. Disposition Plan: Status is: Inpatient Remains inpatient appropriate because: Unsafe discharge, stable to transfer to skilled level of care     Consultants:  Palliative care  Procedures:  None  Antimicrobials:  Completed   Subjective: Seen and examined.  Denies any complaints.  Objective: Vitals:   01/18/22 0749 01/18/22 1503 01/18/22 2102 01/19/22 0437  BP: (!) 132/57 (!) 134/54 (!) 154/83 (!) 149/76  Pulse: 72 73 75 72  Resp:  '20 14 14  '$ Temp: 98.5 F (36.9 C) (!) 97.5 F (36.4 C) 98.9 F (37.2 C) 98.7 F (37.1 C)  TempSrc: Oral Oral Oral Oral  SpO2:  97% 95% 94%    Intake/Output Summary (Last 24 hours) at 01/19/2022 1403 Last data filed at 01/19/2022 0900 Gross per 24 hour  Intake 240 ml  Output 475 ml  Net -235 ml    There were no vitals filed for this visit.  Examination:  Looks comfortable.  Sitting in chair and trying to eat breakfast. Alert and awake and oriented x1-2.    Data Reviewed: I have personally reviewed following labs and imaging studies  CBC: No results for input(s): "WBC", "NEUTROABS", "HGB", "HCT", "MCV", "PLT" in the last 168 hours.  Basic Metabolic Panel: Recent Labs  Lab 01/18/22 0523  CREATININE 0.95    GFR: CrCl cannot be calculated (Unknown ideal weight.). Liver Function Tests: No results for input(s): "AST", "ALT", "ALKPHOS",  "BILITOT", "PROT", "ALBUMIN" in the last 168 hours.  No results for input(s): "LIPASE", "AMYLASE" in the last 168 hours. No results for input(s): "AMMONIA" in the last 168 hours. Coagulation Profile: No results for input(s): "INR", "PROTIME" in the last 168 hours. Cardiac Enzymes: No results for input(s): "CKTOTAL", "CKMB", "CKMBINDEX", "TROPONINI" in the last 168 hours. BNP (last 3 results) No results for input(s): "PROBNP" in the last 8760 hours. HbA1C: No results for input(s): "HGBA1C" in the last 72 hours. CBG: No results for input(s): "GLUCAP" in the last 168 hours. Lipid Profile: No results for input(s): "CHOL", "HDL", "LDLCALC", "TRIG", "CHOLHDL", "LDLDIRECT" in the last 72 hours. Thyroid Function Tests: No results for input(s): "TSH", "T4TOTAL", "FREET4", "T3FREE", "THYROIDAB" in the last 72 hours. Anemia Panel: No results for input(s): "VITAMINB12", "FOLATE", "FERRITIN", "TIBC", "IRON", "RETICCTPCT" in the last 72 hours. Sepsis Labs: No results for input(s): "PROCALCITON", "LATICACIDVEN" in the last 168 hours.   Recent Results (from the past 240 hour(s))  Resp Panel by RT-PCR (Flu A&B, Covid) Anterior Nasal Swab     Status: None   Collection Time: 01/11/22  1:40 PM   Specimen: Anterior Nasal Swab  Result Value Ref Range Status   SARS Coronavirus 2 by RT PCR NEGATIVE NEGATIVE Final    Comment: (NOTE) SARS-CoV-2 target nucleic acids are NOT DETECTED.  The SARS-CoV-2 RNA is generally detectable in upper respiratory specimens during the acute phase of infection. The lowest concentration of SARS-CoV-2 viral copies this assay can detect is 138 copies/mL. A negative result does not preclude SARS-Cov-2  infection and should not be used as the sole basis for treatment or other patient management decisions. A negative result may occur with  improper specimen collection/handling, submission of specimen other than nasopharyngeal swab, presence of viral mutation(s) within  the areas targeted by this assay, and inadequate number of viral copies(<138 copies/mL). A negative result must be combined with clinical observations, patient history, and epidemiological information. The expected result is Negative.  Fact Sheet for Patients:  EntrepreneurPulse.com.au  Fact Sheet for Healthcare Providers:  IncredibleEmployment.be  This test is no t yet approved or cleared by the [place] FDA and  has been authorized for detection and/or diagnosis of SARS-CoV-2 by FDA under an Emergency Use Authorization (EUA). This EUA will remain  in effect (meaning this test can be used) for the duration of the COVID-19 declaration under Section 564(b)(1) of the Act, 21 U.S.C.section 360bbb-3(b)(1), unless the authorization is terminated  or revoked sooner.       Influenza A by PCR NEGATIVE NEGATIVE Final   Influenza B by PCR NEGATIVE NEGATIVE Final    Comment: (NOTE) The Xpert Xpress SARS-CoV-2/FLU/RSV plus assay is intended as an aid in the diagnosis of influenza from Nasopharyngeal swab specimens and should not be used as a sole basis for treatment. Nasal washings and aspirates are unacceptable for Xpert Xpress SARS-CoV-2/FLU/RSV testing.  Fact Sheet for Patients: EntrepreneurPulse.com.au  Fact Sheet for Healthcare Providers: IncredibleEmployment.be  This test is not yet approved or cleared by the [place] FDA and has been authorized for detection and/or diagnosis of SARS-CoV-2 by FDA under an Emergency Use Authorization (EUA). This EUA will remain in effect (meaning this test can be used) for the duration of the COVID-19 declaration under Section 564(b)(1) of the Act, 21 U.S.C. section 360bbb-3(b)(1), unless the authorization is terminated or revoked.  Performed at [facility], [place] [address], [place], [place] [address]   Urine Culture     Status: None    Collection Time: 01/11/22  2:58 PM   Specimen: In/Out Cath Urine  Result Value Ref Range Status   Specimen Description   Final    IN/OUT CATH URINE Performed at [facility] [address], [place], [place] [address]    Special Requests   Final    NONE Performed at [facility], [place] [address], [place], [place] [address]    Culture   Final    NO GROWTH Performed at [facility], [place] [address], [place], [place] [address]    Report Status 01/13/2022 FINAL  Final         Radiology Studies: No results found.      Scheduled Meds:  vitamin C  500 mg Oral Daily   enoxaparin (LOVENOX) injection  30 mg Subcutaneous Q24H   irbesartan  300 mg Oral q morning   lactose free nutrition  237 mL Oral BID BM   levothyroxine  75 mcg Oral Q0600   nystatin   Topical TID   polyethylene glycol  17 g Oral Daily   zinc sulfate  220 mg Oral Daily   Continuous Infusions:   LOS: 7 days    Time spent: 25 minutes    Barb Merino, MD [facility] Pager [phone]

## 2022-01-20 DIAGNOSIS — N390 Urinary tract infection, site not specified: Secondary | ICD-10-CM | POA: Diagnosis not present

## 2022-01-20 NOTE — Progress Notes (Signed)
Occupational Therapy Treatment Patient Details Name: Tiffany [name] MRN: [id number] DOB: 10-Jul-1926 Today's Date: 01/20/2022   History of present illness Patient is a 88 year old female with medical history significant of AKI, atypical mole, basal cell carcinoma, breast cancer, colon polyps, hypertension, hypothyroidism, osteoporosis, rectal bleeding, rhabdomyolysis, history of altered nonhemorrhagic CVA, history of multiple UTIs, dementia who is brought to the emergency department by her son due to worsening dementia over the last few days.  He stated this happens when she gets a UTI.  She has also been very weak and has had a few falls over the last month and a half.   OT comments  Patient was able to participate in grooming tasks (mod A grooming tasks) sitting EOB with min guard and occasional min A for LOB sitting to R side. Patient was mod A to transfer from edge of bed to recliner in room with RW with increased physical assistance to complete transfer with patient attempting to sit prior to finish transfer. Patient's discharge plan remains appropriate at this time. OT will continue to follow acutely.     Recommendations for follow up therapy are one component of a multi-disciplinary discharge planning process, led by the attending physician.  Recommendations may be updated based on patient status, additional functional criteria and insurance authorization.    Follow Up Recommendations  Skilled nursing-short term rehab (<3 hours/day)    Assistance Recommended at Discharge Frequent or constant Supervision/Assistance  Patient can return home with the following  A lot of help with bathing/dressing/bathroom;Assistance with cooking/housework;Direct supervision/assist for medications management;Assist for transportation   Equipment Recommendations  None recommended by OT    Recommendations for Other Services      Precautions / Restrictions Precautions Precautions:  Fall Restrictions Weight Bearing Restrictions: No       Mobility Bed Mobility Overal bed mobility: Needs Assistance Bed Mobility: Supine to Sit     Supine to sit: Mod assist     General bed mobility comments: with increased time and cues for seqencing of task.    Transfers                         Balance Overall balance assessment: Needs assistance Sitting-balance support: Bilateral upper extremity supported Sitting balance-Leahy Scale: Poor Sitting balance - Comments: leaning to R side with increased desire to get back in bed noted Postural control: Right lateral lean Standing balance support: During functional activity, Reliant on assistive device for balance Standing balance-Leahy Scale: Poor Standing balance comment: reliant on RW                           ADL either performed or assessed with clinical judgement   ADL Overall ADL's : Needs assistance/impaired     Grooming: Wash/dry face;Brushing hair;Moderate assistance Grooming Details (indicate cue type and reason): sitting EOB with increased time. occasional posterior leaning noted sitting EOB min A to correct. noted to be easily distracted with incrased assist needed for grooming on this date.                 Toilet Transfer: Moderate assistance;Ambulation;Rolling walker (2 wheels) Toilet Transfer Details (indicate cue type and reason): to recliner in room to sit up for some time. patient was noted to need inreased assistance when patietn attempted to sit prior to finishing last step to transfer to recliner.  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) during session was able to follow simple commands to participate in session        Exercises      Shoulder Instructions       General Comments      Pertinent Vitals/ Pain       Pain Assessment Pain Assessment: No/denies pain  Home Living                                          Prior Functioning/Environment              Frequency  Min 2X/week        Progress Toward Goals  OT Goals(current goals can now be found in the care plan section)  Progress towards OT goals: Progressing toward goals     Plan Discharge plan remains appropriate    Co-evaluation                 AM-PAC OT "6 Clicks" Daily Activity     Outcome Measure   Help from another person eating meals?: A Little Help from another person taking care of personal grooming?: A Little   Help from another person bathing (including washing, rinsing, drying)?: A Lot Help from another person to put on and taking off regular upper body clothing?: A Lot Help from another person to put on and taking off regular lower body clothing?: A Lot 6 Click Score: 12    End of Session Equipment Utilized During Treatment: Rolling walker (2 wheels);Gait belt  OT Visit Diagnosis: Muscle weakness (generalized) (M62.81);Other symptoms and signs involving cognitive function   Activity Tolerance Patient limited by pain   Patient Left in chair;with call bell/phone within reach;with chair alarm set   Nurse Communication Mobility status        Time: 1440-1453 OT Time Calculation (min): 13 min  Charges: OT General Charges $OT Visit: 1 Visit OT Treatments $Self Care/Home Management : 8-22 mins  Rennie Plowman, MS Acute Rehabilitation Department Office# [phone]   Marcellina Millin 01/20/2022, 5:06 PM

## 2022-01-20 NOTE — TOC Progression Note (Signed)
Transition of Care [facility]) - Progression Note    Patient Details  Name: Tiffany [name] MRN: [id number] Date of Birth: 11/14/1926  Transition of Care [facility]) CM/SW Contact  Ross Ludwig, [place] Phone Number: 01/20/2022, 9:58 AM  Clinical Narrative:     Insurance authorization still pending for [employer].       Expected Discharge Plan and Services                                                 Social Determinants of Health (SDOH) Interventions    Readmission Risk Interventions     No data to display

## 2022-01-20 NOTE — Progress Notes (Signed)
PROGRESS NOTE    Tiffany [name]  GGY:[id number] DOB: 1927-03-07 DOA: 01/11/2022 PCP: Donald Prose, MD    Brief Narrative:  Admitted from independent living apartment, history of dementia.  Admitted with encephalopathy secondary to UTI.   Assessment & Plan:   Acute UTI, completed treatment Acute metabolic encephalopathy in a patient with underlying dementia: Improving. Deconditioning, and need for rehab.  Medically stable.  Transfer to SNF when bed available.   DVT prophylaxis: enoxaparin (LOVENOX) injection 30 mg Start: 01/11/22 2200   Code Status: DNR Family Communication: None.  Multiple attempts to call patient's son unsuccessful. Disposition Plan: Status is: Inpatient Remains inpatient appropriate because: Unsafe discharge, stable to transfer to skilled level of care     Consultants:  Palliative care  Procedures:  None  Antimicrobials:  Completed   Subjective: Seen and examined.  Denies any complaints.  She is missing her son.  She wants to share breakfast with her son.  I tried to call him and he did not pick up phone.  Voicemail left.  Objective: Vitals:   01/19/22 0437 01/19/22 1424 01/19/22 2203 01/20/22 0423  BP: (!) 149/76 (!) 140/66 (!) 159/68 (!) 178/72  Pulse: 72 76 74 74  Resp: '14 19 14 14  '$ Temp: 98.7 F (37.1 C) 97.6 F (36.4 C) 98.4 F (36.9 C) 98.5 F (36.9 C)  TempSrc: Oral  Oral Oral  SpO2: 94% 97% 97% 97%    Intake/Output Summary (Last 24 hours) at 01/20/2022 1054 Last data filed at 01/20/2022 0433 Gross per 24 hour  Intake 240 ml  Output 350 ml  Net -110 ml   There were no vitals filed for this visit.  Examination:  Looks comfortable.   Eating breakfast. Alert and awake and oriented x1-2.  Pleasant and occasionally confused.    Data Reviewed: I have personally reviewed following labs and imaging studies  CBC: No results for input(s): "WBC", "NEUTROABS", "HGB", "HCT", "MCV", "PLT" in the last 168 hours.  Basic Metabolic  Panel: Recent Labs  Lab 01/18/22 0523  CREATININE 0.95   GFR: CrCl cannot be calculated (Unknown ideal weight.). Liver Function Tests: No results for input(s): "AST", "ALT", "ALKPHOS", "BILITOT", "PROT", "ALBUMIN" in the last 168 hours.  No results for input(s): "LIPASE", "AMYLASE" in the last 168 hours. No results for input(s): "AMMONIA" in the last 168 hours. Coagulation Profile: No results for input(s): "INR", "PROTIME" in the last 168 hours. Cardiac Enzymes: No results for input(s): "CKTOTAL", "CKMB", "CKMBINDEX", "TROPONINI" in the last 168 hours. BNP (last 3 results) No results for input(s): "PROBNP" in the last 8760 hours. HbA1C: No results for input(s): "HGBA1C" in the last 72 hours. CBG: No results for input(s): "GLUCAP" in the last 168 hours. Lipid Profile: No results for input(s): "CHOL", "HDL", "LDLCALC", "TRIG", "CHOLHDL", "LDLDIRECT" in the last 72 hours. Thyroid Function Tests: No results for input(s): "TSH", "T4TOTAL", "FREET4", "T3FREE", "THYROIDAB" in the last 72 hours. Anemia Panel: No results for input(s): "VITAMINB12", "FOLATE", "FERRITIN", "TIBC", "IRON", "RETICCTPCT" in the last 72 hours. Sepsis Labs: No results for input(s): "PROCALCITON", "LATICACIDVEN" in the last 168 hours.   Recent Results (from the past 240 hour(s))  Resp Panel by RT-PCR (Flu A&B, Covid) Anterior Nasal Swab     Status: None   Collection Time: 01/11/22  1:40 PM   Specimen: Anterior Nasal Swab  Result Value Ref Range Status   SARS Coronavirus 2 by RT PCR NEGATIVE NEGATIVE Final    Comment: (NOTE) SARS-CoV-2 target nucleic acids are NOT DETECTED.  The  SARS-CoV-2 RNA is generally detectable in upper respiratory specimens during the acute phase of infection. The lowest concentration of SARS-CoV-2 viral copies this assay can detect is 138 copies/mL. A negative result does not preclude SARS-Cov-2 infection and should not be used as the sole basis for treatment or other patient  management decisions. A negative result may occur with  improper specimen collection/handling, submission of specimen other than nasopharyngeal swab, presence of viral mutation(s) within the areas targeted by this assay, and inadequate number of viral copies(<138 copies/mL). A negative result must be combined with clinical observations, patient history, and epidemiological information. The expected result is Negative.  Fact Sheet for Patients:  EntrepreneurPulse.com.au  Fact Sheet for Healthcare Providers:  IncredibleEmployment.be  This test is no t yet approved or cleared by the [place] FDA and  has been authorized for detection and/or diagnosis of SARS-CoV-2 by FDA under an Emergency Use Authorization (EUA). This EUA will remain  in effect (meaning this test can be used) for the duration of the COVID-19 declaration under Section 564(b)(1) of the Act, 21 U.S.C.section 360bbb-3(b)(1), unless the authorization is terminated  or revoked sooner.       Influenza A by PCR NEGATIVE NEGATIVE Final   Influenza B by PCR NEGATIVE NEGATIVE Final    Comment: (NOTE) The Xpert Xpress SARS-CoV-2/FLU/RSV plus assay is intended as an aid in the diagnosis of influenza from Nasopharyngeal swab specimens and should not be used as a sole basis for treatment. Nasal washings and aspirates are unacceptable for Xpert Xpress SARS-CoV-2/FLU/RSV testing.  Fact Sheet for Patients: EntrepreneurPulse.com.au  Fact Sheet for Healthcare Providers: IncredibleEmployment.be  This test is not yet approved or cleared by the [place] FDA and has been authorized for detection and/or diagnosis of SARS-CoV-2 by FDA under an Emergency Use Authorization (EUA). This EUA will remain in effect (meaning this test can be used) for the duration of the COVID-19 declaration under Section 564(b)(1) of the Act, 21 U.S.C. section 360bbb-3(b)(1),  unless the authorization is terminated or revoked.  Performed at [facility], [place] [address], [place], [place] [address]   Urine Culture     Status: None   Collection Time: 01/11/22  2:58 PM   Specimen: In/Out Cath Urine  Result Value Ref Range Status   Specimen Description   Final    IN/OUT CATH URINE Performed at [facility] [address], [place], [place] [address]    Special Requests   Final    NONE Performed at [facility], [place] [address] [place], [place] [address]    Culture   Final    NO GROWTH Performed at [facility], [place] [address] [place], [place] [address]    Report Status 01/13/2022 FINAL  Final         Radiology Studies: No results found.      Scheduled Meds:  vitamin C  500 mg Oral Daily   enoxaparin (LOVENOX) injection  30 mg Subcutaneous Q24H   irbesartan  300 mg Oral q morning   lactose free nutrition  237 mL Oral BID BM   levothyroxine  75 mcg Oral Q0600   nystatin   Topical TID   polyethylene glycol  17 g Oral Daily   zinc sulfate  220 mg Oral Daily   Continuous Infusions:   LOS: 8 days    Time spent: 25 minutes    Barb Merino, MD [facility] Pager [phone]

## 2022-01-21 DIAGNOSIS — R4182 Altered mental status, unspecified: Secondary | ICD-10-CM | POA: Diagnosis not present

## 2022-01-21 DIAGNOSIS — Z79890 Hormone replacement therapy (postmenopausal): Secondary | ICD-10-CM | POA: Diagnosis not present

## 2022-01-21 DIAGNOSIS — R8290 Unspecified abnormal findings in urine: Secondary | ICD-10-CM | POA: Diagnosis not present

## 2022-01-21 DIAGNOSIS — R627 Adult failure to thrive: Secondary | ICD-10-CM | POA: Diagnosis not present

## 2022-01-21 DIAGNOSIS — Z7401 Bed confinement status: Secondary | ICD-10-CM | POA: Diagnosis not present

## 2022-01-21 DIAGNOSIS — Z96659 Presence of unspecified artificial knee joint: Secondary | ICD-10-CM | POA: Diagnosis not present

## 2022-01-21 DIAGNOSIS — G309 Alzheimer's disease, unspecified: Secondary | ICD-10-CM | POA: Diagnosis not present

## 2022-01-21 DIAGNOSIS — E860 Dehydration: Secondary | ICD-10-CM | POA: Diagnosis not present

## 2022-01-21 DIAGNOSIS — Z66 Do not resuscitate: Secondary | ICD-10-CM | POA: Diagnosis not present

## 2022-01-21 DIAGNOSIS — R531 Weakness: Secondary | ICD-10-CM | POA: Diagnosis not present

## 2022-01-21 DIAGNOSIS — F0390 Unspecified dementia without behavioral disturbance: Secondary | ICD-10-CM | POA: Diagnosis not present

## 2022-01-21 DIAGNOSIS — Z882 Allergy status to sulfonamides status: Secondary | ICD-10-CM | POA: Diagnosis not present

## 2022-01-21 DIAGNOSIS — E43 Unspecified severe protein-calorie malnutrition: Secondary | ICD-10-CM | POA: Diagnosis not present

## 2022-01-21 DIAGNOSIS — I10 Essential (primary) hypertension: Secondary | ICD-10-CM | POA: Diagnosis not present

## 2022-01-21 DIAGNOSIS — R5381 Other malaise: Secondary | ICD-10-CM | POA: Diagnosis not present

## 2022-01-21 DIAGNOSIS — Z79899 Other long term (current) drug therapy: Secondary | ICD-10-CM | POA: Diagnosis not present

## 2022-01-21 DIAGNOSIS — M810 Age-related osteoporosis without current pathological fracture: Secondary | ICD-10-CM | POA: Diagnosis not present

## 2022-01-21 DIAGNOSIS — E440 Moderate protein-calorie malnutrition: Secondary | ICD-10-CM | POA: Diagnosis not present

## 2022-01-21 DIAGNOSIS — R001 Bradycardia, unspecified: Secondary | ICD-10-CM | POA: Diagnosis not present

## 2022-01-21 DIAGNOSIS — M6281 Muscle weakness (generalized): Secondary | ICD-10-CM | POA: Diagnosis not present

## 2022-01-21 DIAGNOSIS — N1832 Chronic kidney disease, stage 3b: Secondary | ICD-10-CM | POA: Diagnosis not present

## 2022-01-21 DIAGNOSIS — Z885 Allergy status to narcotic agent status: Secondary | ICD-10-CM | POA: Diagnosis not present

## 2022-01-21 DIAGNOSIS — R296 Repeated falls: Secondary | ICD-10-CM | POA: Diagnosis not present

## 2022-01-21 DIAGNOSIS — R69 Illness, unspecified: Secondary | ICD-10-CM | POA: Diagnosis not present

## 2022-01-21 DIAGNOSIS — N390 Urinary tract infection, site not specified: Secondary | ICD-10-CM | POA: Diagnosis not present

## 2022-01-21 DIAGNOSIS — I129 Hypertensive chronic kidney disease with stage 1 through stage 4 chronic kidney disease, or unspecified chronic kidney disease: Secondary | ICD-10-CM | POA: Diagnosis not present

## 2022-01-21 DIAGNOSIS — Z20822 Contact with and (suspected) exposure to covid-19: Secondary | ICD-10-CM | POA: Diagnosis not present

## 2022-01-21 DIAGNOSIS — R1312 Dysphagia, oropharyngeal phase: Secondary | ICD-10-CM | POA: Diagnosis not present

## 2022-01-21 DIAGNOSIS — Z87440 Personal history of urinary (tract) infections: Secondary | ICD-10-CM | POA: Diagnosis not present

## 2022-01-21 DIAGNOSIS — E039 Hypothyroidism, unspecified: Secondary | ICD-10-CM | POA: Diagnosis not present

## 2022-01-21 DIAGNOSIS — R488 Other symbolic dysfunctions: Secondary | ICD-10-CM | POA: Diagnosis not present

## 2022-01-21 DIAGNOSIS — G301 Alzheimer's disease with late onset: Secondary | ICD-10-CM | POA: Diagnosis not present

## 2022-01-21 DIAGNOSIS — I160 Hypertensive urgency: Secondary | ICD-10-CM | POA: Diagnosis not present

## 2022-01-21 DIAGNOSIS — Z833 Family history of diabetes mellitus: Secondary | ICD-10-CM | POA: Diagnosis not present

## 2022-01-21 DIAGNOSIS — L89151 Pressure ulcer of sacral region, stage 1: Secondary | ICD-10-CM | POA: Diagnosis not present

## 2022-01-21 DIAGNOSIS — Z83511 Family history of glaucoma: Secondary | ICD-10-CM | POA: Diagnosis not present

## 2022-01-21 DIAGNOSIS — G9341 Metabolic encephalopathy: Secondary | ICD-10-CM | POA: Diagnosis not present

## 2022-01-21 NOTE — TOC Progression Note (Addendum)
Transition of Care [facility]) - Progression Note    Patient Details  Name: Tiffany [name] MRN: [id number] Date of Birth: 05/29/1926  Transition of Care [facility]) CM/SW Contact  Leeroy Cha, RN Phone Number: 01/21/2022, 9:27 AM  Clinical Narrative:    [facility] at [employer] [facility] [place] from [place] has been obtained. Md made aware for possible dc. Patient will go to room 210-A, Number to call report is [phone]. Ptar called at [phone] for transport      Expected Discharge Plan and Services                                                 Social Determinants of Health (SDOH) Interventions    Readmission Risk Interventions   No data to display

## 2022-01-21 NOTE — Discharge Summary (Signed)
Physician Discharge Summary  Tiffany [name] OLM:[id number] DOB: 1927/02/26 DOA: 01/11/2022  PCP: Donald Prose, MD  Admit date: 01/11/2022 Discharge date: 01/21/2022  Admitted From: Home Disposition: Skilled nursing facility  Recommendations for Outpatient Follow-up:  Follow up with PCP in 1-2 weeks   Home Health: N/A Equipment/Devices: N/A  Discharge Condition: Fair CODE STATUS: DNR Diet recommendation: Regular diet  Discharge summary: 88 year old with history of dementia, hypertension, hypothyroidism, CKD stage IIIa admitted with altered mental status confusion and found to have acute UTI.  She was treated with IV antibiotics and symptomatically improved.  Completed treatment.   Acute UTI present on admission: Urine cultures negative.  Completed 5 days of Rocephin.  Acute metabolic encephalopathy in a patient with underlying dementia: Did very well.  Back to her normal mentation.  Behavior is very well controlled.  There was no focal neurological deficits.  Hypertension with asymptomatic sinus bradycardia: Metoprolol discontinued.  Blood pressures controlled now on irbesartan.  CKD stage IIIb: Stable.  Hypothyroidism: Stable on Synthroid.  Severe protein calorie malnutrition: Encourage oral intake.  Palliative following as outpatient.  Physical deconditioning: Will benefit with ongoing inpatient therapies before going back home.  Stable to transfer to skilled level of care.   Discharge Diagnoses:  Principal Problem:   Acute UTI (urinary tract infection) Active Problems:   Acute metabolic encephalopathy   CKD (chronic kidney disease) stage 3, GFR 30-59 ml/min (HCC)   Hypothyroidism   Pressure injury of skin   Frequent falls   Hypertensive urgency   Sinus bradycardia   Protein-calorie malnutrition, severe    Discharge Instructions  Discharge Instructions     Diet general   Complete by: As directed    Increase activity slowly   Complete by: As directed        Allergies as of 01/21/2022       Reactions   Tape Other (See Comments)   SKIN TEARS VERY EASILY- B/P CUFF EVEN HURTS   Codeine Nausea Only   Morphine And Related Anxiety, Other (See Comments)   Felt very unwell and "out there"   Sulfa Antibiotics Hives, Itching   Chocolate Diarrhea, Other (See Comments)   Has polyps in the lower intestine = severe diarrhea and bleeding from the rectum   Coffee Bean Extract [coffea Arabica] Other (See Comments)   Has polyps in the lower intestine = severe diarrhea and bleeding from the rectum   Other Other (See Comments)   NO SEEDS, CORN, and NUTS - Has polyps in the lower intestine = severe diarrhea and bleeding from the rectum        Medication List     STOP taking these medications    furosemide 40 MG tablet Commonly known as: LASIX   metoprolol tartrate 50 MG tablet Commonly known as: LOPRESSOR       TAKE these medications    acetaminophen 500 MG tablet Commonly known as: TYLENOL Take 1,000 mg by mouth in the morning and at bedtime.   irbesartan 300 MG tablet Commonly known as: AVAPRO Take 300 mg by mouth every morning.   levothyroxine 75 MCG tablet Commonly known as: SYNTHROID Take 1 tablet (75 mcg total) by mouth daily at 6 (six) AM.   loperamide 2 MG tablet Commonly known as: IMODIUM A-D Take 2 mg by mouth daily as needed for diarrhea or loose stools.        Contact information for after-discharge care     Destination     [facility] .  Service: Skilled Nursing Contact information: [address] [address] [phone]                    Allergies  Allergen Reactions   Tape Other (See Comments)    SKIN TEARS VERY EASILY- B/P CUFF EVEN HURTS   Codeine Nausea Only   Morphine And Related Anxiety and Other (See Comments)    Felt very unwell and "out there"   Sulfa Antibiotics Hives and Itching   Chocolate Diarrhea and Other (See Comments)     Has polyps in the lower intestine = severe diarrhea and bleeding from the rectum   Coffee Bean Extract [Coffea Arabica] Other (See Comments)    Has polyps in the lower intestine = severe diarrhea and bleeding from the rectum   Other Other (See Comments)    NO SEEDS, CORN, and NUTS - Has polyps in the lower intestine = severe diarrhea and bleeding from the rectum    Consultations: None   Procedures/Studies: NM Pulmonary Perfusion  Result Date: 01/14/2022 CLINICAL DATA:  PE suspected EXAM: NUCLEAR MEDICINE PERFUSION LUNG SCAN TECHNIQUE: Perfusion images were obtained in multiple projections after intravenous injection of radiopharmaceutical. Ventilation scans intentionally deferred if perfusion scan and chest x-ray adequate for interpretation during COVID 19 epidemic. RADIOPHARMACEUTICALS:  4.4 mCi Tc-66mMAA IV COMPARISON:  Same-day chest radiograph FINDINGS: Normal, homogeneous bilateral pulmonary perfusion. No suspicious perfusion defects. Cardiomegaly. IMPRESSION: Very low probability for pulmonary embolism by modified perfusion only PIOPED criteria (PE absent). Electronically Signed   By: ADelanna AhmadiM.D.   On: 01/14/2022 14:42   DG Chest Port 1 View  Result Date: 01/14/2022 CLINICAL DATA:  Shortness of breath EXAM: PORTABLE CHEST 1 VIEW COMPARISON:  11/21/2021 FINDINGS: Cardiomegaly. Mild, diffuse bilateral interstitial pulmonary opacity. Unchanged elevation of the left hemidiaphragm. Possible small bilateral pleural effusions. The visualized skeletal structures are unremarkable. IMPRESSION: 1. Cardiomegaly with mild, diffuse bilateral interstitial pulmonary opacity, likely edema. No focal airspace opacity. 2.  Possible small bilateral pleural effusions. Electronically Signed   By: ADelanna AhmadiM.D.   On: 01/14/2022 14:41   VAS [place]OWER EXTREMITY VENOUS (DVT)  Result Date: 01/14/2022  Lower Venous DVT Study Patient Name:  Tiffany [name] Date of Exam:   01/13/2022 Medical Rec #:  [id number]        Accession #:    [id number] of Birth: [birth year]        Patient Gender: F Patient Age:   89years [facility]      VAS [place]OWER EXTREMITY VENOUS (DVT) Referring Phys: RSt. Martin--------------------------------------------------------------------------------  Indications: Edema.  Limitations: Body habitus, poor ultrasound/tissue interface and restricted mobility, dementia. Comparison Study: No prior study Performing Technologist: MMaudry MayhewMHA, RDMS, RVT, RDCS  Examination Guidelines: A complete evaluation includes B-mode imaging, spectral Doppler, color Doppler, and power Doppler as needed of all accessible portions of each vessel. Bilateral testing is considered an integral part of a complete examination. Limited examinations for reoccurring indications may be performed as noted. The reflux portion of the exam is performed with the patient in reverse Trendelenburg.  +---------+---------------+---------+-----------+----------+--------------+ RIGHT    CompressibilityPhasicitySpontaneityPropertiesThrombus Aging +---------+---------------+---------+-----------+----------+--------------+ CFV      Full           Yes      Yes                                 +---------+---------------+---------+-----------+----------+--------------+  SFJ      Full                                                        +---------+---------------+---------+-----------+----------+--------------+ FV Prox  Full                                                        +---------+---------------+---------+-----------+----------+--------------+ FV Mid   Full                                                        +---------+---------------+---------+-----------+----------+--------------+ FV Distal               Yes      Yes                                 +---------+---------------+---------+-----------+----------+--------------+ PFV       Full                                                        +---------+---------------+---------+-----------+----------+--------------+ POP      Full           Yes      Yes                                 +---------+---------------+---------+-----------+----------+--------------+ PTV      Full                    Yes                                 +---------+---------------+---------+-----------+----------+--------------+   Right Technical Findings: Not visualized segments include peroneal veins.  +---------+---------------+---------+-----------+----------+--------------+ LEFT     CompressibilityPhasicitySpontaneityPropertiesThrombus Aging +---------+---------------+---------+-----------+----------+--------------+ CFV      Full           Yes      Yes                                 +---------+---------------+---------+-----------+----------+--------------+ SFJ      Full                                                        +---------+---------------+---------+-----------+----------+--------------+ FV Prox  Full                                                        +---------+---------------+---------+-----------+----------+--------------+  FV Mid   Full                                                        +---------+---------------+---------+-----------+----------+--------------+ FV DistalFull                                                        +---------+---------------+---------+-----------+----------+--------------+ POP      Full           Yes      Yes                                 +---------+---------------+---------+-----------+----------+--------------+ PTV      Full                                                        +---------+---------------+---------+-----------+----------+--------------+ PERO     Full                                                         +---------+---------------+---------+-----------+----------+--------------+   Left Technical Findings: Not visualized segments include PFV.   Summary: RIGHT: - There is no evidence of deep vein thrombosis in the lower extremity. However, portions of this examination were limited- see technologist comments above.  - No cystic structure found in the popliteal fossa.  LEFT: - There is no evidence of deep vein thrombosis in the lower extremity. However, portions of this examination were limited- see technologist comments above.  - No cystic structure found in the popliteal fossa.  *See table(s) above for measurements and observations. Electronically signed by Monica Martinez MD on 01/14/2022 at 1:48:19 PM.    Final    (Echo, Carotid, EGD, Colonoscopy, ERCP)    Subjective: Patient seen and examined.  No overnight events.  Denies any complaints.  Agreeable to go to rehab.  Still missing her son.   Discharge Exam: Vitals:   01/20/22 2106 01/21/22 0622  BP: (!) 175/72 (!) 158/76  Pulse: 69 72  Resp: 14 14  Temp: 98.6 F (37 C) 98.6 F (37 C)  SpO2: 97% 96%   Vitals:   01/20/22 0423 01/20/22 1424 01/20/22 2106 01/21/22 0622  BP: (!) 178/72 (!) 146/70 (!) 175/72 (!) 158/76  Pulse: 74 70 69 72  Resp: '14 16 14 14  '$ Temp: 98.5 F (36.9 C) 98.6 F (37 C) 98.6 F (37 C) 98.6 F (37 C)  TempSrc: Oral Oral Oral Oral  SpO2: 97% 98% 97% 96%    General: Pt is alert, awake, not in acute distress Patient is oriented x1-2.  Well composed.  Pleasant. Cardiovascular: RRR, S1/S2 +, no rubs, no gallops Respiratory: CTA bilaterally, no wheezing, no rhonchi Abdominal: Soft, NT, ND, bowel sounds + Extremities: no edema, no cyanosis    The  results of significant diagnostics from this hospitalization (including imaging, microbiology, ancillary and laboratory) are listed below for reference.     Microbiology: Recent Results (from the past 240 hour(s))  Resp Panel by RT-PCR (Flu A&B, Covid) Anterior  Nasal Swab     Status: None   Collection Time: 01/11/22  1:40 PM   Specimen: Anterior Nasal Swab  Result Value Ref Range Status   SARS Coronavirus 2 by RT PCR NEGATIVE NEGATIVE Final    Comment: (NOTE) SARS-CoV-2 target nucleic acids are NOT DETECTED.  The SARS-CoV-2 RNA is generally detectable in upper respiratory specimens during the acute phase of infection. The lowest concentration of SARS-CoV-2 viral copies this assay can detect is 138 copies/mL. A negative result does not preclude SARS-Cov-2 infection and should not be used as the sole basis for treatment or other patient management decisions. A negative result may occur with  improper specimen collection/handling, submission of specimen other than nasopharyngeal swab, presence of viral mutation(s) within the areas targeted by this assay, and inadequate number of viral copies(<138 copies/mL). A negative result must be combined with clinical observations, patient history, and epidemiological information. The expected result is Negative.  Fact Sheet for Patients:  EntrepreneurPulse.com.au  Fact Sheet for Healthcare Providers:  IncredibleEmployment.be  This test is no t yet approved or cleared by the [place] FDA and  has been authorized for detection and/or diagnosis of SARS-CoV-2 by FDA under an Emergency Use Authorization (EUA). This EUA will remain  in effect (meaning this test can be used) for the duration of the COVID-19 declaration under Section 564(b)(1) of the Act, 21 U.S.C.section 360bbb-3(b)(1), unless the authorization is terminated  or revoked sooner.       Influenza A by PCR NEGATIVE NEGATIVE Final   Influenza B by PCR NEGATIVE NEGATIVE Final    Comment: (NOTE) The Xpert Xpress SARS-CoV-2/FLU/RSV plus assay is intended as an aid in the diagnosis of influenza from Nasopharyngeal swab specimens and should not be used as a sole basis for treatment. Nasal washings  and aspirates are unacceptable for Xpert Xpress SARS-CoV-2/FLU/RSV testing.  Fact Sheet for Patients: EntrepreneurPulse.com.au  Fact Sheet for Healthcare Providers: IncredibleEmployment.be  This test is not yet approved or cleared by the [place] FDA and has been authorized for detection and/or diagnosis of SARS-CoV-2 by FDA under an Emergency Use Authorization (EUA). This EUA will remain in effect (meaning this test can be used) for the duration of the COVID-19 declaration under Section 564(b)(1) of the Act, 21 U.S.C. section 360bbb-3(b)(1), unless the authorization is terminated or revoked.  Performed at [facility], [place] [address] [place], [place] [address]   Urine Culture     Status: None   Collection Time: 01/11/22  2:58 PM   Specimen: In/Out Cath Urine  Result Value Ref Range Status   Specimen Description   Final    IN/OUT CATH URINE Performed at [facility] [address], [place], [place] [address]    Special Requests   Final    NONE Performed at [facility], [place] [address] [place], [place] [address]    Culture   Final    NO GROWTH Performed at [facility], [place] [address] [place], [place] [address]    Report Status 01/13/2022 FINAL  Final     Labs: BNP (last 3 results) Recent Labs    11/21/21 1700  BNP 938.1*   Basic Metabolic Panel: Recent Labs  Lab 01/18/22 0523  CREATININE 0.95  Liver Function Tests: No results for input(s): "AST", "ALT", "ALKPHOS", "BILITOT", "PROT", "ALBUMIN" in the last 168 hours. No results for input(s): "LIPASE", "AMYLASE" in the last 168 hours. No results for input(s): "AMMONIA" in the last 168 hours. CBC: No results for input(s): "WBC", "NEUTROABS", "HGB", "HCT", "MCV", "PLT" in the last 168 hours. Cardiac Enzymes: No results for input(s): "CKTOTAL", "CKMB", "CKMBINDEX", "TROPONINI" in the last 168  hours. BNP: Invalid input(s): "POCBNP" CBG: No results for input(s): "GLUCAP" in the last 168 hours. D-Dimer No results for input(s): "DDIMER" in the last 72 hours. Hgb A1c No results for input(s): "HGBA1C" in the last 72 hours. Lipid Profile No results for input(s): "CHOL", "HDL", "LDLCALC", "TRIG", "CHOLHDL", "LDLDIRECT" in the last 72 hours. Thyroid function studies No results for input(s): "TSH", "T4TOTAL", "T3FREE", "THYROIDAB" in the last 72 hours.  Invalid input(s): "FREET3" Anemia work up No results for input(s): "VITAMINB12", "FOLATE", "FERRITIN", "TIBC", "IRON", "RETICCTPCT" in the last 72 hours. Urinalysis    Component Value Date/Time   COLORURINE YELLOW 01/11/2022 1409   APPEARANCEUR CLEAR 01/11/2022 1409   LABSPEC 1.017 01/11/2022 1409   PHURINE 5.0 01/11/2022 1409   GLUCOSEU NEGATIVE 01/11/2022 1409   HGBUR NEGATIVE 01/11/2022 1409   BILIRUBINUR NEGATIVE 01/11/2022 1409   KETONESUR NEGATIVE 01/11/2022 1409   PROTEINUR NEGATIVE 01/11/2022 1409   UROBILINOGEN 0.2 02/09/2015 0158   NITRITE NEGATIVE 01/11/2022 1409   LEUKOCYTESUR SMALL (A) 01/11/2022 1409   Sepsis Labs No results for input(s): "WBC" in the last 168 hours.  Invalid input(s): "PROCALCITONIN", "LACTICIDVEN" Microbiology Recent Results (from the past 240 hour(s))  Resp Panel by RT-PCR (Flu A&B, Covid) Anterior Nasal Swab     Status: None   Collection Time: 01/11/22  1:40 PM   Specimen: Anterior Nasal Swab  Result Value Ref Range Status   SARS Coronavirus 2 by RT PCR NEGATIVE NEGATIVE Final    Comment: (NOTE) SARS-CoV-2 target nucleic acids are NOT DETECTED.  The SARS-CoV-2 RNA is generally detectable in upper respiratory specimens during the acute phase of infection. The lowest concentration of SARS-CoV-2 viral copies this assay can detect is 138 copies/mL. A negative result does not preclude SARS-Cov-2 infection and should not be used as the sole basis for treatment or other patient  management decisions. A negative result may occur with  improper specimen collection/handling, submission of specimen other than nasopharyngeal swab, presence of viral mutation(s) within the areas targeted by this assay, and inadequate number of viral copies(<138 copies/mL). A negative result must be combined with clinical observations, patient history, and epidemiological information. The expected result is Negative.  Fact Sheet for Patients:  EntrepreneurPulse.com.au  Fact Sheet for Healthcare Providers:  IncredibleEmployment.be  This test is no t yet approved or cleared by the [place] FDA and  has been authorized for detection and/or diagnosis of SARS-CoV-2 by FDA under an Emergency Use Authorization (EUA). This EUA will remain  in effect (meaning this test can be used) for the duration of the COVID-19 declaration under Section 564(b)(1) of the Act, 21 U.S.C.section 360bbb-3(b)(1), unless the authorization is terminated  or revoked sooner.       Influenza A by PCR NEGATIVE NEGATIVE Final   Influenza B by PCR NEGATIVE NEGATIVE Final    Comment: (NOTE) The Xpert Xpress SARS-CoV-2/FLU/RSV plus assay is intended as an aid in the diagnosis of influenza from Nasopharyngeal swab specimens and should not be used as a sole basis for treatment. Nasal washings and aspirates are unacceptable for Xpert Xpress SARS-CoV-2/FLU/RSV testing.  Fact Sheet  for Patients: EntrepreneurPulse.com.au  Fact Sheet for Healthcare Providers: IncredibleEmployment.be  This test is not yet approved or cleared by the [place] FDA and has been authorized for detection and/or diagnosis of SARS-CoV-2 by FDA under an Emergency Use Authorization (EUA). This EUA will remain in effect (meaning this test can be used) for the duration of the COVID-19 declaration under Section 564(b)(1) of the Act, 21 U.S.C. section 360bbb-3(b)(1),  unless the authorization is terminated or revoked.  Performed at [facility], [place] [address], [place], [place] [address]   Urine Culture     Status: None   Collection Time: 01/11/22  2:58 PM   Specimen: In/Out Cath Urine  Result Value Ref Range Status   Specimen Description   Final    IN/OUT CATH URINE Performed at [place] [address], [place], [place] [address]    Special Requests   Final    NONE Performed at [facility], [place] [address], [place],  [address]    Culture   Final    NO GROWTH Performed at [facility], [place] [address] [place],  [address]    Report Status 01/13/2022 FINAL  Final     Time coordinating discharge: 28 minutes  SIGNED:   Barb Merino, MD  [facility] 01/21/2022, 9:40 AM

## 2022-01-21 NOTE — Care Management Important Message (Signed)
Important Message  Patient Details IM Letter placed in Patients room. Name: Tiffany [name] MRN: [id number] Date of Birth: 24-Jan-1927   Medicare Important Message Given:  Yes     Kerin Salen 01/21/2022, 9:06 AM

## 2022-01-24 DIAGNOSIS — I10 Essential (primary) hypertension: Secondary | ICD-10-CM | POA: Diagnosis not present

## 2022-01-24 DIAGNOSIS — N390 Urinary tract infection, site not specified: Secondary | ICD-10-CM | POA: Diagnosis not present

## 2022-01-24 DIAGNOSIS — R5381 Other malaise: Secondary | ICD-10-CM | POA: Diagnosis not present

## 2022-01-24 DIAGNOSIS — E039 Hypothyroidism, unspecified: Secondary | ICD-10-CM | POA: Diagnosis not present

## 2022-01-25 DIAGNOSIS — F0390 Unspecified dementia without behavioral disturbance: Secondary | ICD-10-CM | POA: Diagnosis not present

## 2022-01-25 DIAGNOSIS — R69 Illness, unspecified: Secondary | ICD-10-CM | POA: Diagnosis not present

## 2022-01-25 DIAGNOSIS — E440 Moderate protein-calorie malnutrition: Secondary | ICD-10-CM | POA: Diagnosis not present

## 2022-01-25 DIAGNOSIS — I10 Essential (primary) hypertension: Secondary | ICD-10-CM | POA: Diagnosis not present

## 2022-01-25 DIAGNOSIS — E039 Hypothyroidism, unspecified: Secondary | ICD-10-CM | POA: Diagnosis not present

## 2022-02-07 ENCOUNTER — Emergency Department (HOSPITAL_COMMUNITY): Payer: Medicare HMO

## 2022-02-07 ENCOUNTER — Other Ambulatory Visit: Payer: Self-pay

## 2022-02-07 ENCOUNTER — Emergency Department (HOSPITAL_COMMUNITY)
Admission: EM | Admit: 2022-02-07 | Discharge: 2022-02-07 | Disposition: A | Discharge status: Home / Self Care | Payer: Medicare HMO | Attending: Emergency Medicine | Admitting: Emergency Medicine

## 2022-02-07 DIAGNOSIS — W19XXXA Unspecified fall, initial encounter: Secondary | ICD-10-CM | POA: Diagnosis not present

## 2022-02-07 DIAGNOSIS — W01198A Fall on same level from slipping, tripping and stumbling with subsequent striking against other object, initial encounter: Secondary | ICD-10-CM | POA: Diagnosis not present

## 2022-02-07 DIAGNOSIS — R82998 Other abnormal findings in urine: Secondary | ICD-10-CM | POA: Insufficient documentation

## 2022-02-07 DIAGNOSIS — I959 Hypotension, unspecified: Secondary | ICD-10-CM | POA: Diagnosis not present

## 2022-02-07 DIAGNOSIS — Y92002 Bathroom of unspecified non-institutional (private) residence single-family (private) house as the place of occurrence of the external cause: Secondary | ICD-10-CM | POA: Diagnosis not present

## 2022-02-07 DIAGNOSIS — M546 Pain in thoracic spine: Secondary | ICD-10-CM | POA: Diagnosis not present

## 2022-02-07 DIAGNOSIS — R531 Weakness: Secondary | ICD-10-CM | POA: Diagnosis not present

## 2022-02-07 DIAGNOSIS — S0990XA Unspecified injury of head, initial encounter: Secondary | ICD-10-CM | POA: Insufficient documentation

## 2022-02-07 DIAGNOSIS — S161XXA Strain of muscle, fascia and tendon at neck level, initial encounter: Secondary | ICD-10-CM | POA: Diagnosis not present

## 2022-02-07 DIAGNOSIS — I10 Essential (primary) hypertension: Secondary | ICD-10-CM | POA: Insufficient documentation

## 2022-02-07 DIAGNOSIS — S199XXA Unspecified injury of neck, initial encounter: Secondary | ICD-10-CM | POA: Diagnosis present

## 2022-02-07 DIAGNOSIS — Z79899 Other long term (current) drug therapy: Secondary | ICD-10-CM | POA: Insufficient documentation

## 2022-02-07 DIAGNOSIS — Z7401 Bed confinement status: Secondary | ICD-10-CM | POA: Diagnosis not present

## 2022-02-07 DIAGNOSIS — I672 Cerebral atherosclerosis: Secondary | ICD-10-CM | POA: Diagnosis not present

## 2022-02-07 DIAGNOSIS — Z043 Encounter for examination and observation following other accident: Secondary | ICD-10-CM | POA: Diagnosis not present

## 2022-02-07 DIAGNOSIS — M545 Low back pain, unspecified: Secondary | ICD-10-CM | POA: Diagnosis not present

## 2022-02-07 DIAGNOSIS — Z743 Need for continuous supervision: Secondary | ICD-10-CM | POA: Diagnosis not present

## 2022-02-07 DIAGNOSIS — I6523 Occlusion and stenosis of bilateral carotid arteries: Secondary | ICD-10-CM | POA: Diagnosis not present

## 2022-02-07 LAB — CBC
HCT: 39.2 % (ref 36.0–46.0)
Hemoglobin: 12.2 g/dL (ref 12.0–15.0)
MCH: 30.9 pg (ref 26.0–34.0)
MCHC: 31.1 g/dL (ref 30.0–36.0)
MCV: 99.2 fL (ref 80.0–100.0)
Platelets: 170 10*3/uL (ref 150–400)
RBC: 3.95 MIL/uL (ref 3.87–5.11)
RDW: 12.7 % (ref 11.5–15.5)
WBC: 6.6 10*3/uL (ref 4.0–10.5)
nRBC: 0 % (ref 0.0–0.2)

## 2022-02-07 LAB — URINALYSIS, ROUTINE W REFLEX MICROSCOPIC
Bilirubin Urine: NEGATIVE
Glucose, UA: NEGATIVE mg/dL
Hgb urine dipstick: NEGATIVE
Ketones, ur: NEGATIVE mg/dL
Leukocytes,Ua: NEGATIVE
Nitrite: NEGATIVE
Protein, ur: NEGATIVE mg/dL
Specific Gravity, Urine: 1.006 (ref 1.005–1.030)
pH: 6 (ref 5.0–8.0)

## 2022-02-07 LAB — BASIC METABOLIC PANEL
Anion gap: 5 (ref 5–15)
BUN: 14 mg/dL (ref 8–23)
CO2: 26 mmol/L (ref 22–32)
Calcium: 9.4 mg/dL (ref 8.9–10.3)
Chloride: 108 mmol/L (ref 98–111)
Creatinine, Ser: 1.08 mg/dL — ABNORMAL HIGH (ref 0.44–1.00)
GFR, Estimated: 48 mL/min — ABNORMAL LOW (ref >60)
Glucose, Bld: 111 mg/dL — ABNORMAL HIGH (ref 70–99)
Potassium: 4.4 mmol/L (ref 3.5–5.1)
Sodium: 139 mmol/L (ref 135–145)

## 2022-02-07 MED ORDER — AMLODIPINE BESYLATE 5 MG PO TABS
5.0000 mg | ORAL_TABLET | Freq: Once | ORAL | Status: AC
  Administered 2022-02-07: 5 mg via ORAL
  Filled 2022-02-07: qty 1

## 2022-02-07 NOTE — ED Notes (Signed)
[facility] contacted. PT placed on transport list

## 2022-02-07 NOTE — Discharge Instructions (Addendum)
Take Tylenol as needed for pain and discomfort.

## 2022-02-07 NOTE — ED Triage Notes (Addendum)
Pt BIBA from independent living. Pt suffered mech fall in bathroom, hitting head on armoire. No LOC, not on blood thinners. Placed in [facility].  No obvious deformities, c/o some pain in neck and head.  PT at baseline Aox2, hx dementia.

## 2022-02-07 NOTE — ED Provider Notes (Signed)
[facility] DEPT Provider Note   CSN: [id number] Arrival date & time: 02/07/22  1257     History  Chief Complaint  Patient presents with   Tiffany [name] is a 88 y.o. female.   Fall   Patient lives at independent living facility.  She had a mechanical fall in the bathroom today.  She tripped and fell hitting her head.  Patient denies any loss of consciousness.  She is having some pain in her neck and the back of her head.  She also has some pain in her mid and lower back.  Patient denied any chest pain or abdominal pain.  No weakness.  Family later arrived and mentioned that they had noticed a foul odor to her urine.  They requested checking her urine for possible UTI    Home Medications Prior to Admission medications   Medication Sig Start Date End Date Taking? Authorizing Provider  acetaminophen (TYLENOL) 500 MG tablet Take 1,000 mg by mouth in the morning and at bedtime.    [provider]  irbesartan (AVAPRO) 300 MG tablet Take 300 mg by mouth every morning.    [provider]  levothyroxine (SYNTHROID) 75 MCG tablet Take 1 tablet (75 mcg total) by mouth daily at 6 (six) AM. 11/27/21   Gherghe, Vella Redhead, MD  loperamide (IMODIUM A-D) 2 MG tablet Take 2 mg by mouth daily as needed for diarrhea or loose stools.    [provider]      Allergies    Tape, Codeine, Morphine and related, Sulfa antibiotics, Chocolate, Coffee bean extract [coffea arabica], Other, and Penicillins    Review of Systems   Review of Systems  Physical Exam Updated Vital Signs BP (!) 193/61 (BP Location: Right Arm)   Pulse 61   Temp (!) 97.5 F (36.4 C) (Oral)   Resp 18   SpO2 99%  Physical Exam Vitals and nursing note reviewed.  Constitutional:      General: She is not in acute distress.    Appearance: She is well-developed.  HENT:     Head: Normocephalic and atraumatic.     Right Ear: External ear normal.     Left Ear:  External ear normal.  Eyes:     General: No scleral icterus.       Right eye: No discharge.        Left eye: No discharge.     Conjunctiva/sclera: Conjunctivae normal.  Neck:     Trachea: No tracheal deviation.  Cardiovascular:     Rate and Rhythm: Normal rate and regular rhythm.  Pulmonary:     Effort: Pulmonary effort is normal. No respiratory distress.     Breath sounds: Normal breath sounds. No stridor. No wheezing or rales.  Abdominal:     General: Bowel sounds are normal. There is no distension.     Palpations: Abdomen is soft.     Tenderness: There is no abdominal tenderness. There is no guarding or rebound.  Musculoskeletal:        General: No deformity.     Cervical back: Neck supple. Tenderness present.     Thoracic back: Tenderness present.     Lumbar back: Tenderness present.  Skin:    General: Skin is warm and dry.     Findings: No rash.  Neurological:     General: No focal deficit present.     Mental Status: She is alert.     Cranial Nerves: No cranial nerve  deficit (no facial droop, extraocular movements intact, no slurred speech).     Sensory: No sensory deficit.     Motor: No abnormal muscle tone or seizure activity.     Coordination: Coordination normal.  Psychiatric:        Mood and Affect: Mood normal.     ED Results / Procedures / Treatments   Labs (all labs ordered are listed, but only abnormal results are displayed) Labs Reviewed  BASIC METABOLIC PANEL - Abnormal; Notable for the following components:      Result Value   Glucose, Bld 111 (*)    Creatinine, Ser 1.08 (*)    GFR, Estimated 48 (*)    All other components within normal limits  URINE CULTURE  CBC  URINALYSIS, ROUTINE W REFLEX MICROSCOPIC    EKG EKG Interpretation  Date/Time:  Thursday February 07 2022 14:25:34 EDT Ventricular Rate:  56 PR Interval:  182 QRS Duration: 142 QT Interval:  466 QTC Calculation: 449 R Axis:   83 Text Interpretation: Sinus bradycardia Right bundle  branch block Abnormal ECG When compared with ECG of 21-Nov-2021 17:48, PREVIOUS ECG IS PRESENT Since last tracing rate slower Confirmed by Dorie Rank [phone]) on 02/07/2022 2:43:49 PM  Radiology CT Head Wo Contrast  Result Date: 02/07/2022 CLINICAL DATA:  Fall EXAM: CT HEAD WITHOUT CONTRAST CT CERVICAL SPINE WITHOUT CONTRAST TECHNIQUE: Multidetector CT imaging of the head and cervical spine was performed following the standard protocol without intravenous contrast. Multiplanar CT image reconstructions of the cervical spine were also generated. RADIATION DOSE REDUCTION: This exam was performed according to the departmental dose-optimization program which includes automated exposure control, adjustment of the mA and/or kV according to patient size and/or use of iterative reconstruction technique. COMPARISON:  Head CT 11/21/2021, brain MRI 11/22/2021 FINDINGS: CT HEAD FINDINGS Brain: There is no acute intracranial hemorrhage, extra-axial fluid collection, or acute infarct. Background parenchymal volume loss with prominence of the ventricular system and extra-axial CSF spaces is unchanged. 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 Additional confluent hypodensity throughout the remainder of the supratentorial white matter consistent with background chronic small-vessel ischemic change is also stable. There is no mass lesion.  There is no mass effect or midline shift. Vascular: There is calcification of the bilateral carotid siphons and vertebral arteries. Skull: Normal. Negative for fracture or focal lesion. Sinuses/Orbits: There is a minimal mucosal thickening in the sphenoid sinuses. Bilateral lens implants are in place. The globes and orbits are otherwise unremarkable. Other: None. CT CERVICAL SPINE FINDINGS Alignment: There is exaggerated cervical lordosis, similar to the prior study. There is no jumped or perched facet or other evidence of traumatic  malalignment. Skull base and vertebrae: Skull base alignment is maintained. Vertebral body heights are preserved. There is no evidence of acute fracture. Soft tissues and spinal canal: No prevertebral fluid or swelling. No visible canal hematoma. Disc levels: There is multilevel disc space narrowing, degenerative endplate change, and facet arthropathy throughout the cervical spine, overall similar to the prior study. There is no evidence of high-grade spinal canal stenosis. Upper chest: There is unchanged scarring in the right lung apex. Other: None. IMPRESSION: 1. Stable noncontrast head CT with no acute intracranial pathology. 2. No acute fracture or traumatic malalignment of the cervical spine. Electronically Signed   By: Valetta Mole M.D.   On: 02/07/2022 14:24   CT Cervical Spine Wo Contrast  Result Date: 02/07/2022 CLINICAL DATA:  Fall EXAM: CT HEAD WITHOUT CONTRAST CT  CERVICAL SPINE WITHOUT CONTRAST TECHNIQUE: Multidetector CT imaging of the head and cervical spine was performed following the standard protocol without intravenous contrast. Multiplanar CT image reconstructions of the cervical spine were also generated. RADIATION DOSE REDUCTION: This exam was performed according to the departmental dose-optimization program which includes automated exposure control, adjustment of the mA and/or kV according to patient size and/or use of iterative reconstruction technique. COMPARISON:  Head CT 11/21/2021, brain MRI 11/22/2021 FINDINGS: CT HEAD FINDINGS Brain: There is no acute intracranial hemorrhage, extra-axial fluid collection, or acute infarct. Background parenchymal volume loss with prominence of the ventricular system and extra-axial CSF spaces is unchanged. 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 Additional confluent hypodensity throughout the remainder of the supratentorial white matter consistent with background chronic small-vessel ischemic change  is also stable. There is no mass lesion.  There is no mass effect or midline shift. Vascular: There is calcification of the bilateral carotid siphons and vertebral arteries. Skull: Normal. Negative for fracture or focal lesion. Sinuses/Orbits: There is a minimal mucosal thickening in the sphenoid sinuses. Bilateral lens implants are in place. The globes and orbits are otherwise unremarkable. Other: None. CT CERVICAL SPINE FINDINGS Alignment: There is exaggerated cervical lordosis, similar to the prior study. There is no jumped or perched facet or other evidence of traumatic malalignment. Skull base and vertebrae: Skull base alignment is maintained. Vertebral body heights are preserved. There is no evidence of acute fracture. Soft tissues and spinal canal: No prevertebral fluid or swelling. No visible canal hematoma. Disc levels: There is multilevel disc space narrowing, degenerative endplate change, and facet arthropathy throughout the cervical spine, overall similar to the prior study. There is no evidence of high-grade spinal canal stenosis. Upper chest: There is unchanged scarring in the right lung apex. Other: None. IMPRESSION: 1. Stable noncontrast head CT with no acute intracranial pathology. 2. No acute fracture or traumatic malalignment of the cervical spine. Electronically Signed   By: Valetta Mole M.D.   On: 02/07/2022 14:24   DG Thoracic Spine 2 View  Result Date: 02/07/2022 CLINICAL DATA:  Back pain after fall. EXAM: THORACIC SPINE 2 VIEWS COMPARISON:  None Available. FINDINGS: Moderate levoscoliosis of thoracic spine is noted. No definite fracture or spondylolisthesis is noted. Multilevel degenerative changes are noted in the lower thoracic spine. IMPRESSION: Moderate levoscoliosis of thoracic spine. No definite acute abnormality is noted. Electronically Signed   By: Marijo Conception M.D.   On: 02/07/2022 14:10   DG Lumbar Spine 2-3 Views  Result Date: 02/07/2022 CLINICAL DATA:  Low back pain  after fall today. EXAM: LUMBAR SPINE - 2-3 VIEW COMPARISON:  Oct 01, 2020. FINDINGS: Moderate dextroscoliosis of lower thoracic and upper lumbar spine is noted. Diffuse osteopenia is noted, and therefore fractures cannot be excluded on the basis of this exam. There does appear to be some degree of spondylolisthesis of L3-4. IMPRESSION: Diffuse osteopenia of the lumbar spine is noted and therefore fracture cannot be excluded on the basis of this exam. Moderate dextroscoliosis is noted. If there is clinical concern for fracture, CT scan is recommended for further evaluation. Electronically Signed   By: Marijo Conception M.D.   On: 02/07/2022 14:08    Procedures Procedures    Medications Ordered in ED Medications  amLODipine (NORVASC) tablet 5 mg (has no administration in time range)    ED Course/ Medical Decision Making/ A&P Clinical Course as of 02/07/22 1513  Thu Feb 07, 2022  1500 CBC nl [JK]  4656 Basic metabolic panel(!) nl [JK]  1501 Head CT C-spine CT thoracic and lumbar spine films without acute findings [JK]    Clinical Course User Index [JK] Dorie Rank, MD                           Medical Decision Making Problems Addressed: Fall, initial encounter: acute illness or injury Strain of neck muscle, initial encounter: acute illness or injury  Amount and/or Complexity of Data Reviewed Labs: ordered. Decision-making details documented in ED Course. Radiology: ordered and independent interpretation performed.   Patient presented to the ED for evaluation after a fall.  No signs of serious injury noted on x-rays.  No focal deficits.  No signs of anemia or dehydration.  ED work-up otherwise reassuring.  Family mentioned that she has an abnormal odor to her urine.  We will add on a urinalysis.  Anticipate discharge back to her living facility.  Tylenol can be taken as needed for pain and discomfort.  Patient also noted to be hypertensive.  We will have her follow-up with her primary care  doctor.  No signs of any acute complications associated with her blood pressure.        Final Clinical Impression(s) / ED Diagnoses Final diagnoses:  Fall, initial encounter  Strain of neck muscle, initial encounter  Hypertension, unspecified type    Rx / DC Orders ED Discharge Orders     None         Dorie Rank, MD 02/07/22 1513

## 2022-02-08 LAB — URINE CULTURE: Culture: NO GROWTH

## 2022-02-23 ENCOUNTER — Encounter (HOSPITAL_COMMUNITY): Payer: Self-pay

## 2022-02-23 ENCOUNTER — Other Ambulatory Visit: Payer: Self-pay

## 2022-02-23 ENCOUNTER — Emergency Department (HOSPITAL_COMMUNITY): Payer: Medicare HMO

## 2022-02-23 ENCOUNTER — Emergency Department (HOSPITAL_COMMUNITY)
Admission: EM | Admit: 2022-02-23 | Discharge: 2022-02-24 | Disposition: A | Discharge status: Home / Self Care | Payer: Medicare HMO | Attending: Emergency Medicine | Admitting: Emergency Medicine

## 2022-02-23 DIAGNOSIS — R4182 Altered mental status, unspecified: Secondary | ICD-10-CM | POA: Diagnosis not present

## 2022-02-23 DIAGNOSIS — S0990XA Unspecified injury of head, initial encounter: Secondary | ICD-10-CM | POA: Diagnosis not present

## 2022-02-23 DIAGNOSIS — W19XXXA Unspecified fall, initial encounter: Secondary | ICD-10-CM | POA: Diagnosis not present

## 2022-02-23 DIAGNOSIS — R410 Disorientation, unspecified: Secondary | ICD-10-CM | POA: Diagnosis not present

## 2022-02-23 DIAGNOSIS — R5383 Other fatigue: Secondary | ICD-10-CM | POA: Diagnosis not present

## 2022-02-23 DIAGNOSIS — W01198A Fall on same level from slipping, tripping and stumbling with subsequent striking against other object, initial encounter: Secondary | ICD-10-CM | POA: Insufficient documentation

## 2022-02-23 DIAGNOSIS — R519 Headache, unspecified: Secondary | ICD-10-CM | POA: Diagnosis not present

## 2022-02-23 DIAGNOSIS — Y92009 Unspecified place in unspecified non-institutional (private) residence as the place of occurrence of the external cause: Secondary | ICD-10-CM | POA: Diagnosis not present

## 2022-02-23 DIAGNOSIS — Z743 Need for continuous supervision: Secondary | ICD-10-CM | POA: Diagnosis not present

## 2022-02-23 DIAGNOSIS — S199XXA Unspecified injury of neck, initial encounter: Secondary | ICD-10-CM | POA: Diagnosis not present

## 2022-02-23 MED ORDER — ACETAMINOPHEN 500 MG PO TABS
1000.0000 mg | ORAL_TABLET | Freq: Once | ORAL | Status: AC
  Administered 2022-02-23: 1000 mg via ORAL
  Filled 2022-02-23: qty 2

## 2022-02-23 NOTE — ED Provider Notes (Signed)
[facility] Emergency Department Provider Note MRN:  [id number]  Arrival date & time: 02/24/22     Chief Complaint   Fall   History of Present Illness   Tiffany [name] is a 88 y.o. year-old female presents to the ED with chief complaint of fall at home.  She states that she was standing up from her couch and tipped over forward.  She states that she hit her head.  She denies being anticoagulated.  She denies any pain in her chest, abdomen, or pelvis.  Denies any pain in her extremities.  Patient is DNR with a MOST form.  History provided by patient.   Review of Systems  Pertinent positive and negative review of systems noted in HPI.    Physical Exam   Vitals:   02/23/22 2145 02/23/22 2146  BP:  (!) 190/98  Pulse:  61  Resp:  16  Temp:  97.7 F (36.5 C)  SpO2: 95% 97%    CONSTITUTIONAL:  well-appearing, NAD NEURO:  Alert and oriented x 3, CN 3-12 grossly intact EYES:  eyes equal and reactive ENT/NECK:  Supple, no stridor  CARDIO:  normal rate, regular rhythm, appears well-perfused  PULM:  No respiratory distress, CTAB GI/GU:  non-distended,  MSK/SPINE:  No gross deformities, no edema, moves all extremities  SKIN:  no rash, atraumatic   *Additional and/or pertinent findings included in MDM below  Diagnostic and Interventional Summary     Labs Reviewed - No data to display  CT HEAD WO CONTRAST (5MM)  Final Result    CT Cervical Spine Wo Contrast  Final Result      Medications  acetaminophen (TYLENOL) tablet 1,000 mg (1,000 mg Oral Given 02/23/22 2334)     Procedures  /  Critical Care Procedures  ED Course and Medical Decision Making  I have reviewed the triage vital signs, the nursing notes, and pertinent available records from the EMR.  Social Determinants Affecting Complexity of Care: Patient has no clinically significant social determinants affecting this chief complaint..   ED Course:    Medical Decision  Making Patient here with ground-level fall.  She hit her head.  She is not anticoagulated.  She has a MOST form and DNR form at the bedside.  We will check CT head and cervical spine.  CTs are negative.  I called and discussed the patient's limited evaluation with her son, Mr. Franki Cabot, who agrees with my plan for discharge.  He is not able to come pick up the patient.  We will need to arrange for PTAR.  Amount and/or Complexity of Data Reviewed Radiology: ordered and independent interpretation performed.    Details: No obvious skull fx or bleed  Risk OTC drugs.     Consultants: No consultations were needed in caring for this patient.   Treatment and Plan: I considered admission due to patient's initial presentation, but after considering the examination and diagnostic results, patient will not require admission and can be discharged with outpatient follow-up.    Final Clinical Impressions(s) / ED Diagnoses     ICD-10-CM   1. Fall, initial encounter  W19.XXXA     2. Injury of head, initial encounter  S09.90XA       ED Discharge Orders     None         Discharge Instructions Discussed with and Provided to Patient:   Discharge Instructions   None      Montine Circle, PA-C 02/24/22 0004    Gareth Morgan,  MD 02/24/22 1507

## 2022-02-23 NOTE — ED Triage Notes (Signed)
Pt BIB [facility] from home. Pt family reports unwitnessed fall of pt. Found pt on apartment floor. Pt c/o headache and has small knot on back of head. Hx of dementia.  Upon ems arrival pt vitals stable.

## 2022-02-24 DIAGNOSIS — Z7401 Bed confinement status: Secondary | ICD-10-CM | POA: Diagnosis not present

## 2022-02-24 DIAGNOSIS — R404 Transient alteration of awareness: Secondary | ICD-10-CM | POA: Diagnosis not present

## 2022-02-24 DIAGNOSIS — Z743 Need for continuous supervision: Secondary | ICD-10-CM | POA: Diagnosis not present

## 2022-03-01 ENCOUNTER — Telehealth: Payer: Self-pay

## 2022-03-01 NOTE — Telephone Encounter (Signed)
        Patient  visited [facility] on 10/22     Telephone encounter attempt :  1st  A HIPAA compliant voice message was left requesting a return call.  Instructed patient to call back .    [facility], [facility]  [phone] [address], [place], [place] [address] Phone: [phone] Email: [email or web address]

## 2022-03-05 ENCOUNTER — Telehealth: Payer: Self-pay

## 2022-03-05 NOTE — Telephone Encounter (Signed)
        Patient  visited [facility] on 10/22    Telephone encounter attempt :  2nd  A HIPAA compliant voice message was left requesting a return call.  Instructed patient to call back    [facility], [facility]  [phone] [address], [place], [place] [address] Phone: [phone] Email: [email or web address]

## 2022-03-13 ENCOUNTER — Emergency Department (HOSPITAL_COMMUNITY): Payer: Medicare HMO

## 2022-03-13 ENCOUNTER — Emergency Department (HOSPITAL_COMMUNITY)
Admission: EM | Admit: 2022-03-13 | Discharge: 2022-03-14 | Disposition: A | Discharge status: Home / Self Care | Payer: Medicare HMO | Attending: Emergency Medicine | Admitting: Emergency Medicine

## 2022-03-13 ENCOUNTER — Encounter (HOSPITAL_COMMUNITY): Payer: Self-pay

## 2022-03-13 DIAGNOSIS — Y9289 Other specified places as the place of occurrence of the external cause: Secondary | ICD-10-CM | POA: Diagnosis not present

## 2022-03-13 DIAGNOSIS — M4312 Spondylolisthesis, cervical region: Secondary | ICD-10-CM | POA: Diagnosis not present

## 2022-03-13 DIAGNOSIS — N189 Chronic kidney disease, unspecified: Secondary | ICD-10-CM | POA: Insufficient documentation

## 2022-03-13 DIAGNOSIS — R296 Repeated falls: Secondary | ICD-10-CM | POA: Diagnosis not present

## 2022-03-13 DIAGNOSIS — Z743 Need for continuous supervision: Secondary | ICD-10-CM | POA: Diagnosis not present

## 2022-03-13 DIAGNOSIS — I639 Cerebral infarction, unspecified: Secondary | ICD-10-CM | POA: Diagnosis not present

## 2022-03-13 DIAGNOSIS — I129 Hypertensive chronic kidney disease with stage 1 through stage 4 chronic kidney disease, or unspecified chronic kidney disease: Secondary | ICD-10-CM | POA: Diagnosis not present

## 2022-03-13 DIAGNOSIS — I10 Essential (primary) hypertension: Secondary | ICD-10-CM

## 2022-03-13 DIAGNOSIS — E1122 Type 2 diabetes mellitus with diabetic chronic kidney disease: Secondary | ICD-10-CM | POA: Insufficient documentation

## 2022-03-13 DIAGNOSIS — S0990XA Unspecified injury of head, initial encounter: Secondary | ICD-10-CM | POA: Diagnosis not present

## 2022-03-13 DIAGNOSIS — Z20822 Contact with and (suspected) exposure to covid-19: Secondary | ICD-10-CM | POA: Diagnosis not present

## 2022-03-13 DIAGNOSIS — W19XXXA Unspecified fall, initial encounter: Secondary | ICD-10-CM | POA: Diagnosis not present

## 2022-03-13 DIAGNOSIS — M25552 Pain in left hip: Secondary | ICD-10-CM | POA: Diagnosis not present

## 2022-03-13 DIAGNOSIS — R2681 Unsteadiness on feet: Secondary | ICD-10-CM | POA: Diagnosis not present

## 2022-03-13 DIAGNOSIS — J984 Other disorders of lung: Secondary | ICD-10-CM | POA: Diagnosis not present

## 2022-03-13 DIAGNOSIS — G319 Degenerative disease of nervous system, unspecified: Secondary | ICD-10-CM | POA: Diagnosis not present

## 2022-03-13 DIAGNOSIS — I6782 Cerebral ischemia: Secondary | ICD-10-CM | POA: Diagnosis not present

## 2022-03-13 DIAGNOSIS — S199XXA Unspecified injury of neck, initial encounter: Secondary | ICD-10-CM | POA: Diagnosis not present

## 2022-03-13 MED ORDER — LEVOTHYROXINE SODIUM 50 MCG PO TABS
75.0000 ug | ORAL_TABLET | Freq: Every day | ORAL | Status: DC
  Administered 2022-03-14: 75 ug via ORAL
  Filled 2022-03-13: qty 1

## 2022-03-13 MED ORDER — IRBESARTAN 300 MG PO TABS
300.0000 mg | ORAL_TABLET | Freq: Every morning | ORAL | Status: DC
  Filled 2022-03-13 (×2): qty 1

## 2022-03-13 MED ORDER — ACETAMINOPHEN 325 MG PO TABS: 650.0000 mg | ORAL_TABLET | ORAL | Status: DC | PRN

## 2022-03-13 MED ORDER — FUROSEMIDE 40 MG PO TABS
80.0000 mg | ORAL_TABLET | Freq: Every day | ORAL | Status: DC
  Administered 2022-03-14: 80 mg via ORAL
  Filled 2022-03-13: qty 2

## 2022-03-13 NOTE — ED Triage Notes (Signed)
Pt BIB [facility] comming in from independent living at [facility]. Pt had an unwitnessed fall due to poor mobility No thinners No complaints  No uti symptoms or abnormal behavior   BP- 150/84 Pulse 52 Resp 16 Spo2 98 Cbg 113

## 2022-03-13 NOTE — Discharge Instructions (Addendum)
Ms. Tiffany [name] was seen in the emergency room for fall.  CT scan of her brain, C-spine are reassuring.  X-ray of her hip is reassuring.  She did not receive any medicine while in the emergency room.   She was noted to have elevated blood pressure in the emergency room.  We would request that you check her blood pressure every 4-6 hours, please start her on antihypertensive if the blood pressure remains high.

## 2022-03-13 NOTE — ED Provider Notes (Signed)
Tiffany [name]   CSN: [id number] Arrival date & time: 03/13/22  1950     History  No chief complaint on file.   Tiffany [name] is a 88 y.o. female.  HPI     88 year old female comes in with chief complaint of fall from independent living facility.  Patient has history of CKD, diabetes.  According to EMS, patient had an unwitnessed fall earlier today.  She is not on any thinners.  Patient is alert, oriented to self.  She states that she resides at a nursing home, but in a week is supposed to move with her son.  She fell down earlier today, without any clear idea on what happened.  Pt denies any specific area of pain besides left side of her hip. She denies any abdominal pain, UTI-like symptoms.  Home Medications Prior to Admission medications   Medication Sig Start Date End Date Taking? Authorizing Provider  acetaminophen (TYLENOL) 500 MG tablet Take 1,000 mg by mouth in the morning and at bedtime.    [provider]  furosemide (LASIX) 80 MG tablet Take 80 mg by mouth daily. 12/10/21   [provider]  irbesartan (AVAPRO) 300 MG tablet Take 300 mg by mouth every morning.    [provider]  levothyroxine (SYNTHROID) 75 MCG tablet Take 1 tablet (75 mcg total) by mouth daily at 6 (six) AM. 11/27/21   Gherghe, Vella Redhead, MD  loperamide (IMODIUM A-D) 2 MG tablet Take 2 mg by mouth daily as needed for diarrhea or loose stools.    [provider]      Allergies    Tape, Codeine, Morphine and related, Sulfa antibiotics, Chocolate, Coffee bean extract [coffea arabica], Other, and Penicillins    Review of Systems   Review of Systems  Physical Exam Updated Vital Signs BP (!) 195/64 (BP Location: Left Arm)   Pulse 60   Temp 98.5 F (36.9 C) (Oral)   Resp 16   SpO2 100%  Physical Exam Vitals and nursing [name] reviewed.  Constitutional:      Appearance: She is well-developed.  HENT:     Head:  Atraumatic.  Eyes:     Extraocular Movements: Extraocular movements intact.     Pupils: Pupils are equal, round, and reactive to light.  Neck:     Comments: In a c-collar Cardiovascular:     Rate and Rhythm: Normal rate.  Pulmonary:     Effort: Pulmonary effort is normal.  Musculoskeletal:     Comments: Head to toe evaluation shows no hematoma, bleeding of the scalp, no facial abrasions, no spine step offs, crepitus of the chest or neck, no tenderness to palpation of the bilateral upper and lower extremities, no gross deformities, no chest tenderness, no pelvic pain.   Skin:    General: Skin is warm and dry.  Neurological:     Mental Status: She is alert and oriented to person, place, and time.     ED Results / Procedures / Treatments   Labs (all labs ordered are listed, but only abnormal results are displayed) Labs Reviewed  RESP PANEL BY RT-PCR (FLU A&B, COVID) [id number]    EKG EKG Interpretation  Date/Time:  Wednesday March 13 2022 20:29:19 EST Ventricular Rate:  60 PR Interval:    QRS Duration: 138 QT Interval:  452 QTC Calculation: 452 R Axis:   115 Text Interpretation: Undetermined rhythm Right bundle branch block Left posterior fascicular block T wave abnormality, consider inferior ischemia Abnormal  ECG When compared with ECG of 07-Feb-2022 14:25, PREVIOUS ECG IS PRESENT No acute changes Confirmed by Varney Biles [phone]) on 03/13/2022 10:01:29 PM  Radiology CT Cervical Spine Wo Contrast  Result Date: 03/13/2022 CLINICAL DATA:  Neck trauma. Unwitnessed fall. EXAM: CT CERVICAL SPINE WITHOUT CONTRAST TECHNIQUE: Multidetector CT imaging of the cervical spine was performed without intravenous contrast. Multiplanar CT image reconstructions were also generated. RADIATION DOSE REDUCTION: This exam was performed according to the departmental dose-optimization program which includes automated exposure control, adjustment of the mA and/or kV according to patient size and/or  use of iterative reconstruction technique. COMPARISON:  Multiple priors most recently 02/23/2022 FINDINGS: Alignment: Stable alignment with exaggerated upper thoracic kyphosis. Stepwise grade 1 anterolisthesis of C5 on C6 and C6 on C7. Skull base and vertebrae: No acute fracture. Vertebral body heights are maintained. The dens and skull base are intact. Soft tissues and spinal canal: No prevertebral fluid or swelling. No visible canal hematoma. Disc levels: Stable degree of degenerative disc disease and facet hypertrophy. Upper chest: Biapical pleuroparenchymal scarring. No acute findings. Other: None. IMPRESSION: 1. No acute fracture of the cervical spine. 2. Stable degree of multilevel degenerative disc disease and facet hypertrophy. Electronically Signed   By: Keith Rake M.D.   On: 03/13/2022 21:29   DG Hip Unilat W or Wo Pelvis 2-3 Views Left  Result Date: 03/13/2022 CLINICAL DATA:  Left hip pain after fall EXAM: DG HIP (WITH OR WITHOUT PELVIS) 2-3V LEFT COMPARISON:  Radiographs 10/01/2020 FINDINGS: Right THA. Marked demineralization limits sensitivity for subtle fractures. No definite fracture, dislocation, or radiographic evidence of loosening of the left hip prosthesis. Heterotopic ossification about the left hip. Remote posttraumatic deformity about the left iliac wing. Degenerative changes right hip, pubic symphysis, SI joints and lumbar spine. Vascular calcifications. IMPRESSION: No definite fracture.  Demineralization.  Left THA. Electronically Signed   By: Placido Sou M.D.   On: 03/13/2022 21:24   CT Head Wo Contrast  Result Date: 03/13/2022 CLINICAL DATA:  Head trauma, moderate-severe 88 year old post unwitnessed fall. EXAM: CT HEAD WITHOUT CONTRAST TECHNIQUE: Contiguous axial images were obtained from the base of the skull through the vertex without intravenous contrast. RADIATION DOSE REDUCTION: This exam was performed according to the departmental dose-optimization program which  includes automated exposure control, adjustment of the mA and/or kV according to patient size and/or use of iterative reconstruction technique. COMPARISON:  Multiple priors most recent head CT 02/23/2022 FINDINGS: Brain: No acute intracranial hemorrhage. Stable degree of atrophy and chronic small vessel ischemia. Remote right parietal and left frontal infarcts unchanged. No evidence of acute ischemia. No subdural or extra-axial collection. Senescent bilateral basal gangliar mineralization. Vascular: Atherosclerosis of skullbase vasculature without hyperdense vessel or abnormal calcification. Skull: No fracture or focal lesion. Sinuses/Orbits: Paranasal sinuses and mastoid air cells are clear. The visualized orbits are unremarkable. Other: Probable sebaceous cyst overlies the right temporal region. IMPRESSION: 1. No acute intracranial abnormality. No skull fracture. 2. Stable atrophy, chronic small vessel ischemia, and remote infarcts. Electronically Signed   By: Keith Rake M.D.   On: 03/13/2022 21:23    Procedures Procedures    Medications Ordered in ED Medications  acetaminophen (TYLENOL) tablet 650 mg (has no administration in time range)  furosemide (LASIX) tablet 80 mg (has no administration in time range)  irbesartan (AVAPRO) tablet 300 mg (has no administration in time range)  levothyroxine (SYNTHROID) tablet 75 mcg (has no administration in time range)    ED Course/ Medical Decision Making/ A&P  Medical Decision Making Amount and/or Complexity of Data Reviewed Radiology: ordered.  Risk OTC drugs. Prescription drug management.   88 year old female comes in with chief complaint of mechanical fall.  Patient resides at independent living facility.  She has history of recurrent falls.  It appears that she had an unwitnessed mechanical fall earlier today.  History provided by patient, EMS.  Patient has no complaints on her side besides some left hip  discomfort, however she is able to raise her leg.  CT scan of the brain, C-spine ordered given her age. X-ray of the left hip also ordered.  I have interpreted CT scan of the brain independently.  There is no evidence of bleeding.  X-rays also reassuring.  Patient able to bear weight.  Stable for discharge.  BP noted to be high.  She is not on any antihypertensive.  Advised nursing home to check the vital signs to see if patient has BP that needs management.  11:43 PM I was made aware by the nursing staff that patient's son is not comfortable with patient going back to independent living facility.  She allegedly had 2 falls today.  Patient is supposed to be moving in with him on Tuesday.  I was unable to connect with the patient's son.  We will put in [facility] consult as it does not appear that patient will be going home overnight anyways due to backup from [facility] and transporting patient back.  Final Clinical Impression(s) / ED Diagnoses Final diagnoses:  Fall, initial encounter  Uncontrolled hypertension    Rx / DC Orders ED Discharge Orders     None         Varney Biles, MD 03/13/22 2344

## 2022-03-14 DIAGNOSIS — R404 Transient alteration of awareness: Secondary | ICD-10-CM | POA: Diagnosis not present

## 2022-03-14 DIAGNOSIS — I10 Essential (primary) hypertension: Secondary | ICD-10-CM | POA: Diagnosis not present

## 2022-03-14 DIAGNOSIS — I959 Hypotension, unspecified: Secondary | ICD-10-CM | POA: Diagnosis not present

## 2022-03-14 DIAGNOSIS — Z7401 Bed confinement status: Secondary | ICD-10-CM | POA: Diagnosis not present

## 2022-03-14 DIAGNOSIS — R4182 Altered mental status, unspecified: Secondary | ICD-10-CM | POA: Diagnosis not present

## 2022-03-14 DIAGNOSIS — Z743 Need for continuous supervision: Secondary | ICD-10-CM | POA: Diagnosis not present

## 2022-03-14 LAB — RESP PANEL BY RT-PCR (FLU A&B, COVID) ARPGX2
Influenza A by PCR: NEGATIVE
Influenza B by PCR: NEGATIVE
SARS Coronavirus 2 by RT PCR: NEGATIVE

## 2022-03-14 NOTE — Evaluation (Addendum)
Physical Therapy Evaluation Patient Details Name: Tiffany [name] MRN: [id number] DOB: 1926/11/22 Today's Date: 03/14/2022  History of Present Illness  88-year-old female comes in with chief complaint of fall from independent living facility. PMH: Falls, AKI, basal cell carcinoma, breast Ca, HTN, osteoporosis, stroke, UTI's, dementia. In hospital. in 9/23  Clinical Impression  Pt admitted with above diagnosis.  Pt currently with functional limitations due to the deficits listed below (see PT Problem List). Pt will benefit from skilled PT to increase their independence and safety with mobility to allow discharge to the venue listed below. Patient known from previous admission.  Patient presents with unstable gait, RLE demonstrates increased valgus in stance and knee is flexed/tends to buckle. Patient currently requires support for safe ambulation .  Patient has been residing at ILF  and , per notes, to go live with son next week. Son not present. Patient unable to provide information about any caregivers and her  functional level other than she fell.          Recommendations for follow up therapy are one component of a multi-disciplinary discharge planning process, led by the attending physician.  Recommendations may be updated based on patient status, additional functional criteria and insurance authorization.  Follow Up Recommendations Skilled nursing-short term rehab (<3 hours/day) Can patient physically be transported by private vehicle: Yes    Assistance Recommended at Discharge Frequent or constant Supervision/Assistance  Patient can return home with the following  A little help with walking and/or transfers;A little help with bathing/dressing/bathroom;Assistance with cooking/housework;Direct supervision/assist for medications management;Assist for transportation;Help with stairs or ramp for entrance    Equipment Recommendations None recommended by PT  Recommendations for Other  Services       Functional Status Assessment Patient has had a recent decline in their functional status and demonstrates the ability to make significant improvements in function in a reasonable and predictable amount of time.     Precautions / Restrictions Precautions Precautions: Fall      Mobility  Bed Mobility Overal bed mobility: Needs Assistance Bed Mobility: Supine to Sit, Sit to Supine     Supine to sit: Mod assist Sit to supine: Mod assist   General bed mobility comments: assist with trunk to sit up and assist to place both  legs back onto bed    Transfers Overall transfer level: Needs assistance Equipment used: Rolling walker (2 wheels) Transfers: Sit to/from Stand Sit to Stand: Mod assist           General transfer comment: steady mod assist from the stretcher, Patient able to sit and scoot back onto  the stretcher with min guard    Ambulation/Gait Ambulation/Gait assistance: Mod assist Gait Distance (Feet): 5 Feet Assistive device: Rolling walker (2 wheels) Gait Pattern/deviations: Step-to pattern, Staggering right Gait velocity: decr     General Gait Details: gait unsteady with right knee with increased valgus with WB and knee flexed, Mod support for balance,  Stairs            Wheelchair Mobility    Modified Rankin (Stroke Patients Only)       Balance Overall balance assessment: Needs assistance, History of Falls Sitting-balance support: Bilateral upper extremity supported, Feet supported Sitting balance-Leahy Scale: Fair     Standing balance support: Bilateral upper extremity supported, During functional activity, Reliant on assistive device for balance Standing balance-Leahy Scale: Poor Standing balance comment: able to stand at Rw to have briefs changed.  Pertinent Vitals/Pain Pain Assessment Pain Assessment: Head ache    Home Living Family/patient expects to be discharged to:: Unsure                    Additional Comments: chart indicates that pt was to move in with son next week, curently in Independent lliving    Prior Function Prior Level of Function : Patient poor historian/Family not available             Mobility Comments: uses a walker for ambulation. ADLs Comments: unsure     Hand Dominance        Extremity/Trunk Assessment   Upper Extremity Assessment Upper Extremity Assessment: Generalized weakness    Lower Extremity Assessment Lower Extremity Assessment: RLE deficits/detail;LLE deficits/detail RLE Deficits / Details: strength to stand but noted  knee tends to have valgus deformity with WB, knee flexed, able to lift leg from bed and flex hip and knee in supine LLE Deficits / Details: leg bears weight  and knee is straighter, able to flex hip and knee in supine    Cervical / Trunk Assessment Cervical / Trunk Assessment: Kyphotic  Communication   Communication: HOH  Cognition Arousal/Alertness: Awake/alert Behavior During Therapy: WFL for tasks assessed/performed Overall Cognitive Status: History of cognitive impairments - at baseline                                 General Comments: 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        General Comments      Exercises     Assessment/Plan    PT Assessment Patient needs continued PT services  PT Problem List Decreased strength;Decreased activity tolerance;Decreased mobility;Decreased cognition;Decreased safety awareness;Decreased balance;Decreased knowledge of use of DME;Decreased knowledge of precautions       PT Treatment Interventions DME instruction;Therapeutic activities;Cognitive remediation;Gait training;Functional mobility training;Therapeutic exercise;Patient/family education    PT Goals (Current goals can be found in the Care Plan section)  Acute Rehab PT Goals Patient Stated Goal: ( answers yes to all questions) PT Goal Formulation: Patient unable to  participate in goal setting Time For Goal Achievement: 03/28/22 Potential to Achieve Goals: Fair    Frequency Min 2X/week     Co-evaluation               AM-PAC PT "6 Clicks" Mobility  Outcome Measure Help needed turning from your back to your side while in a flat bed without using bedrails?: A Lot Help needed moving from lying on your back to sitting on the side of a flat bed without using bedrails?: A Lot Help needed moving to and from a bed to a chair (including a wheelchair)?: A Lot Help needed standing up from a chair using your arms (e.g., wheelchair or bedside chair)?: A Lot Help needed to walk in hospital room?: Total Help needed climbing 3-5 steps with a railing? : Total 6 Click Score: 10    End of Session Equipment Utilized During Treatment: Gait belt Activity Tolerance: Patient tolerated treatment well Patient left: in bed;with call bell/phone within reach Nurse Communication: Mobility status PT Visit Diagnosis: Unsteadiness on feet (R26.81);Repeated falls (R29.6);History of falling (Z91.81);Difficulty in walking, not elsewhere classified (R26.2)    Time: 8850-2774 PT Time Calculation (min) (ACUTE ONLY): 26 min   Charges:   PT Evaluation $PT Eval Low Complexity: 1 Low PT Treatments $Gait Training: 8-22 mins  [place] Office [phone] Weekend [phone]   Claretha Cooper 03/14/2022, 11:43 AM

## 2022-03-14 NOTE — Social Work (Signed)
CSW called [facility]. The patient is 2 on the list.

## 2022-03-14 NOTE — ED Provider Notes (Signed)
Emergency Medicine Observation Re-evaluation Note  Tiffany [name] is a 88 y.o. female, seen on rounds today.  Pt initially presented to the ED for complaints of No chief complaint on file. Currently, the patient is cleared for discharge.  Physical Exam  BP (!) 128/55   Pulse (!) 58   Temp 98.5 F (36.9 C) (Oral)   Resp 16   SpO2 96%  Physical Exam   ED Course / MDM  EKG:EKG Interpretation  Date/Time:  Wednesday March 13 2022 20:29:19 EST Ventricular Rate:  60 PR Interval:    QRS Duration: 138 QT Interval:  452 QTC Calculation: 452 R Axis:   115 Text Interpretation: Undetermined rhythm Right bundle branch block Left posterior fascicular block T wave abnormality, consider inferior ischemia Abnormal ECG When compared with ECG of 07-Feb-2022 14:25, PREVIOUS ECG IS PRESENT No acute changes Confirmed by Varney Biles [phone]) on 03/13/2022 10:01:29 PM  I have reviewed the labs performed to date as well as medications administered while in observation.  Recent changes in the last 24 hours include seen by TOC.  Plan  Current plan is for home with assistance.    Lacretia Leigh, MD 03/14/22 [phone]

## 2022-03-14 NOTE — Progress Notes (Signed)
Transition of Care [facility]) - Emergency Department Mini Assessment   Patient Details  Name: Tiffany [name] MRN: [id number] Date of Birth: 1927/03/27  Transition of Care [facility]) CM/SW Contact:    Joaquin Courts, RN Phone Number: 03/14/2022, 12:26 PM   Clinical Narrative: CM spoke with patient's son regarding discharge planning, son shares that patient lives alone in an [place], however he does pay for private duty care giver to come out to assist patient with some ADL's and he visits patient regularly.  Son was hopeful in the past to transition patient into ALF/ memory care, however the medicaid application has been denied and son has made the decision to move patient into his home to better care for her.  He reports he works from home and the private duty aide will continue to help out as well.  CM discussed with son the SNF recommendation post PT eval.  Son reports this would not work, patient has a long history of previous SNF stays, and she has used nearly all her medicare days.  Son does not want SNF placement at this time.  Reports would like for patient to return to ILF today, caretaker expected to be there today as well.  CM discussed HHPT/OT services and son is in agreement with this, has no preference for agency.  Son does report that he would like services to start after patient moves into his home on Tuesday.  CM contacted [facility] rep AMY Hyatt and gave referral, rep accepts referral and will contact son to discuss start of care date.  Plan is for patient to return to ILF today with [facility] services.  MD updated. No further TOC needs identified.    ED Mini Assessment: What brought you to the Emergency Department? : fall  Barriers to Discharge: No Barriers Identified     Means of departure: Ambulance  Interventions which prevented an admission or readmission: [place] or Services    Patient Contact and Communications     Spoke with: son Contact Date: 03/14/22,    Contact time: 1225 Contact Phone Number: [phone] Call outcome: plan to return to ILF today  Patient states their goals for this hospitalization and ongoing recovery are:: to move in with her son on tuesday CMS Medicare.gov Compare Post Acute Care list provided to:: Patient Represenative (must comment) Choice offered to / list presented to : Adult Children  Admission diagnosis:  Fall Patient Active Problem List   Diagnosis Date Noted   Protein-calorie malnutrition, severe 01/14/2022   Foul smelling urine 01/11/2022   Dehydration 01/11/2022   Acute UTI (urinary tract infection) 01/11/2022   Hypertensive urgency 01/11/2022   Sinus bradycardia 01/11/2022   Late onset Alzheimer dementia ([place]) 11/30/2021   Malnutrition of moderate degree 46/28/6381   Acute metabolic encephalopathy 77/03/6578   Frequent falls 10/02/2020   Cerebral embolism with cerebral infarction 12/23/2017   Pressure injury of skin 12/22/2017   CKD (chronic kidney disease) stage 3, GFR 30-59 ml/min ([place]) 12/21/2017   Hypothyroidism 12/21/2017   Internal bleeding hemorrhoids 10/26/2012   HTN (hypertension) 10/04/2012   PCP:  Donald Prose, MD Pharmacy:   [facility] [id number], [place] [address] Phone: [phone] Fax: [phone]

## 2022-03-14 NOTE — Progress Notes (Signed)
TOC noted consult, CM called and left HIPAA compliant VM for patient's son.  Awaiting call back.

## 2022-03-14 NOTE — ED Notes (Signed)
This RN called the son and informed him of d/c. He is worried about her going home to be by herself. He stated that she has become way more confused and that she is getting up and falling more. She is scheduled to go to his house to live on Tuesday but does not want her going back to independent living. Varney Biles, MD aware of this. He states will keep pt overnight and will consult TOC

## 2022-03-18 ENCOUNTER — Emergency Department (HOSPITAL_COMMUNITY): Payer: Medicare HMO

## 2022-03-18 ENCOUNTER — Other Ambulatory Visit: Payer: Self-pay

## 2022-03-18 ENCOUNTER — Emergency Department (HOSPITAL_COMMUNITY)
Admission: EM | Admit: 2022-03-18 | Discharge: 2022-03-20 | Disposition: A | Discharge status: Home / Self Care | Payer: Medicare HMO | Attending: Emergency Medicine | Admitting: Emergency Medicine

## 2022-03-18 DIAGNOSIS — W19XXXA Unspecified fall, initial encounter: Secondary | ICD-10-CM | POA: Diagnosis not present

## 2022-03-18 DIAGNOSIS — N3000 Acute cystitis without hematuria: Secondary | ICD-10-CM | POA: Diagnosis not present

## 2022-03-18 DIAGNOSIS — R000 Tachycardia, unspecified: Secondary | ICD-10-CM | POA: Insufficient documentation

## 2022-03-18 DIAGNOSIS — R1110 Vomiting, unspecified: Secondary | ICD-10-CM | POA: Insufficient documentation

## 2022-03-18 DIAGNOSIS — D61818 Other pancytopenia: Secondary | ICD-10-CM | POA: Insufficient documentation

## 2022-03-18 DIAGNOSIS — M247 Protrusio acetabuli: Secondary | ICD-10-CM | POA: Diagnosis not present

## 2022-03-18 DIAGNOSIS — S0990XA Unspecified injury of head, initial encounter: Secondary | ICD-10-CM | POA: Diagnosis not present

## 2022-03-18 DIAGNOSIS — S61412A Laceration without foreign body of left hand, initial encounter: Secondary | ICD-10-CM | POA: Insufficient documentation

## 2022-03-18 DIAGNOSIS — R29898 Other symptoms and signs involving the musculoskeletal system: Secondary | ICD-10-CM | POA: Diagnosis not present

## 2022-03-18 DIAGNOSIS — Z8673 Personal history of transient ischemic attack (TIA), and cerebral infarction without residual deficits: Secondary | ICD-10-CM | POA: Diagnosis not present

## 2022-03-18 DIAGNOSIS — Z79899 Other long term (current) drug therapy: Secondary | ICD-10-CM | POA: Diagnosis not present

## 2022-03-18 DIAGNOSIS — E039 Hypothyroidism, unspecified: Secondary | ICD-10-CM | POA: Insufficient documentation

## 2022-03-18 DIAGNOSIS — G319 Degenerative disease of nervous system, unspecified: Secondary | ICD-10-CM | POA: Diagnosis not present

## 2022-03-18 DIAGNOSIS — I10 Essential (primary) hypertension: Secondary | ICD-10-CM | POA: Diagnosis not present

## 2022-03-18 DIAGNOSIS — Y92129 Unspecified place in nursing home as the place of occurrence of the external cause: Secondary | ICD-10-CM | POA: Diagnosis not present

## 2022-03-18 DIAGNOSIS — Z743 Need for continuous supervision: Secondary | ICD-10-CM | POA: Diagnosis not present

## 2022-03-18 DIAGNOSIS — S8002XA Contusion of left knee, initial encounter: Secondary | ICD-10-CM | POA: Insufficient documentation

## 2022-03-18 DIAGNOSIS — R1111 Vomiting without nausea: Secondary | ICD-10-CM | POA: Diagnosis not present

## 2022-03-18 DIAGNOSIS — R531 Weakness: Secondary | ICD-10-CM | POA: Diagnosis not present

## 2022-03-18 DIAGNOSIS — R519 Headache, unspecified: Secondary | ICD-10-CM | POA: Diagnosis not present

## 2022-03-18 DIAGNOSIS — Z96642 Presence of left artificial hip joint: Secondary | ICD-10-CM | POA: Diagnosis not present

## 2022-03-18 DIAGNOSIS — S6990XA Unspecified injury of unspecified wrist, hand and finger(s), initial encounter: Secondary | ICD-10-CM | POA: Diagnosis not present

## 2022-03-18 DIAGNOSIS — M47816 Spondylosis without myelopathy or radiculopathy, lumbar region: Secondary | ICD-10-CM | POA: Diagnosis not present

## 2022-03-18 DIAGNOSIS — N3001 Acute cystitis with hematuria: Secondary | ICD-10-CM | POA: Diagnosis not present

## 2022-03-18 DIAGNOSIS — S8001XA Contusion of right knee, initial encounter: Secondary | ICD-10-CM | POA: Insufficient documentation

## 2022-03-18 DIAGNOSIS — G9389 Other specified disorders of brain: Secondary | ICD-10-CM | POA: Diagnosis not present

## 2022-03-18 DIAGNOSIS — Z043 Encounter for examination and observation following other accident: Secondary | ICD-10-CM | POA: Diagnosis not present

## 2022-03-18 LAB — COMPREHENSIVE METABOLIC PANEL
ALT: 9 U/L (ref 0–44)
AST: 26 U/L (ref 15–41)
Albumin: 3.1 g/dL — ABNORMAL LOW (ref 3.5–5.0)
Alkaline Phosphatase: 78 U/L (ref 38–126)
Anion gap: 11 (ref 5–15)
BUN: 23 mg/dL (ref 8–23)
CO2: 23 mmol/L (ref 22–32)
Calcium: 8.2 mg/dL — ABNORMAL LOW (ref 8.9–10.3)
Chloride: 108 mmol/L (ref 98–111)
Creatinine, Ser: 0.85 mg/dL (ref 0.44–1.00)
GFR, Estimated: >60 mL/min (ref >60)
Glucose, Bld: 132 mg/dL — ABNORMAL HIGH (ref 70–99)
Potassium: 4.5 mmol/L (ref 3.5–5.1)
Sodium: 142 mmol/L (ref 135–145)
Total Bilirubin: 1.6 mg/dL — ABNORMAL HIGH (ref 0.3–1.2)
Total Protein: 5.8 g/dL — ABNORMAL LOW (ref 6.5–8.1)

## 2022-03-18 LAB — CBC WITH DIFFERENTIAL/PLATELET
Abs Immature Granulocytes: 0.02 10*3/uL (ref 0.00–0.07)
Abs Immature Granulocytes: 0.02 10*3/uL (ref 0.00–0.07)
Basophils Absolute: 0 10*3/uL (ref 0.0–0.1)
Basophils Absolute: 0 10*3/uL (ref 0.0–0.1)
Basophils Relative: 0 %
Basophils Relative: 0 %
Eosinophils Absolute: 0 10*3/uL (ref 0.0–0.5)
Eosinophils Absolute: 0 10*3/uL (ref 0.0–0.5)
Eosinophils Relative: 0 %
Eosinophils Relative: 0 %
HCT: 14.9 % — ABNORMAL LOW (ref 36.0–46.0)
HCT: 37.7 % (ref 36.0–46.0)
Hemoglobin: 4.2 g/dL — CL (ref 12.0–15.0)
Hemoglobin: 11.9 g/dL — ABNORMAL LOW (ref 12.0–15.0)
Immature Granulocytes: 1 %
Immature Granulocytes: 0 %
Lymphocytes Relative: 9 %
Lymphocytes Relative: 10 %
Lymphs Abs: 0.2 10*3/uL — ABNORMAL LOW (ref 0.7–4.0)
Lymphs Abs: 0.7 10*3/uL (ref 0.7–4.0)
MCH: 31.6 pg (ref 26.0–34.0)
MCH: 30.8 pg (ref 26.0–34.0)
MCHC: 28.2 g/dL — ABNORMAL LOW (ref 30.0–36.0)
MCHC: 31.6 g/dL (ref 30.0–36.0)
MCV: 112 fL — ABNORMAL HIGH (ref 80.0–100.0)
MCV: 97.7 fL (ref 80.0–100.0)
Monocytes Absolute: 0.2 10*3/uL (ref 0.1–1.0)
Monocytes Absolute: 0.5 10*3/uL (ref 0.1–1.0)
Monocytes Relative: 8 %
Monocytes Relative: 7 %
Neutro Abs: 2.2 10*3/uL (ref 1.7–7.7)
Neutro Abs: 6 10*3/uL (ref 1.7–7.7)
Neutrophils Relative %: 82 %
Neutrophils Relative %: 83 %
Platelets: 47 10*3/uL — ABNORMAL LOW (ref 150–400)
Platelets: 176 10*3/uL (ref 150–400)
RBC: 1.33 MIL/uL — ABNORMAL LOW (ref 3.87–5.11)
RBC: 3.86 MIL/uL — ABNORMAL LOW (ref 3.87–5.11)
RDW: 13.2 % (ref 11.5–15.5)
RDW: 13.3 % (ref 11.5–15.5)
WBC: 2.7 10*3/uL — ABNORMAL LOW (ref 4.0–10.5)
WBC: 7.3 10*3/uL (ref 4.0–10.5)
nRBC: 0 % (ref 0.0–0.2)
nRBC: 0 % (ref 0.0–0.2)

## 2022-03-18 LAB — URINALYSIS, ROUTINE W REFLEX MICROSCOPIC
Bilirubin Urine: NEGATIVE
Glucose, UA: NEGATIVE mg/dL
Ketones, ur: NEGATIVE mg/dL
Leukocytes,Ua: NEGATIVE
Nitrite: NEGATIVE
Protein, ur: 30 mg/dL — AB
RBC / HPF: >50 RBC/hpf — ABNORMAL HIGH (ref 0–5)
Specific Gravity, Urine: 1.016 (ref 1.005–1.030)
pH: 5 (ref 5.0–8.0)

## 2022-03-18 LAB — CK: Total CK: 178 U/L (ref 38–234)

## 2022-03-18 MED ORDER — CEPHALEXIN 500 MG PO CAPS: 500.0000 mg | ORAL_CAPSULE | Freq: Four times a day (QID) | ORAL | 0 refills | Status: DC

## 2022-03-18 MED ORDER — CEPHALEXIN 500 MG PO CAPS: 500.0000 mg | ORAL_CAPSULE | Freq: Once | ORAL | Status: DC

## 2022-03-18 MED ORDER — CEFDINIR 300 MG PO CAPS: 300.0000 mg | ORAL_CAPSULE | Freq: Two times a day (BID) | ORAL | 0 refills | Status: AC

## 2022-03-18 MED ORDER — CEFDINIR 300 MG PO CAPS
300.0000 mg | ORAL_CAPSULE | Freq: Two times a day (BID) | ORAL | Status: DC
  Administered 2022-03-18 – 2022-03-20 (×4): 300 mg via ORAL
  Filled 2022-03-18 (×7): qty 1

## 2022-03-18 NOTE — ED Provider Notes (Signed)
[facility] DEPT Provider Note   CSN: [id number] Arrival date & time: 03/18/22  1400     History  Chief Complaint  Patient presents with   Tiffany [name] is a 88 y.o. female.  Patient is a 88 year old female with a history of hypertension, hypothyroidism, stroke, frequent falls who lives currently in independent living being brought in today after being found down.  Patient has been seen this is her fourth time in the last 2 weeks for falls.  She was last seen 4 days ago and at that time had no evidence of injury was able to ambulate but had evaluation by PT and was not suitable for independent living.  Patient's son is involved in her care and she was supposed to be moving in with him tomorrow but was again found on the ground this morning.  She reports she was walking down the stairs and remembers falling.  She thinks she may have hit her head but denies any loss of consciousness.  She did tell EMS that she vomited but is not covered in emesis.  She has no complaints of pain.  She denies any chest pain or shortness of breath.  She does not take any anticoagulation.  The history is provided by the patient and medical records.  Fall       Home Medications Prior to Admission medications   Medication Sig Start Date End Date Taking? Authorizing Provider  acetaminophen (TYLENOL) 500 MG tablet Take 1,000 mg by mouth in the morning and at bedtime.    [provider]  furosemide (LASIX) 80 MG tablet Take 80 mg by mouth daily. 12/10/21   [provider]  irbesartan (AVAPRO) 300 MG tablet Take 300 mg by mouth every morning.    [provider]  levothyroxine (SYNTHROID) 75 MCG tablet Take 1 tablet (75 mcg total) by mouth daily at 6 (six) AM. 11/27/21   Gherghe, Vella Redhead, MD  loperamide (IMODIUM A-D) 2 MG tablet Take 2 mg by mouth daily as needed for diarrhea or loose stools.    [provider]  metoprolol tartrate  (LOPRESSOR) 50 MG tablet Take 50 mg by mouth 2 (two) times daily. 03/01/22   [provider]      Allergies    Tape, Codeine, Morphine and related, Sulfa antibiotics, Chocolate, Coffee bean extract [coffea arabica], Other, and Penicillins    Review of Systems   Review of Systems  Physical Exam Updated Vital Signs BP (!) 156/76   Pulse 61   Temp 98.1 F (36.7 C) (Oral)   Resp 16   Ht '5\' 8"'$  (1.727 m)   Wt 66.7 kg   SpO2 100%   BMI 22.35 kg/m  Physical Exam Vitals and nursing note reviewed.  Constitutional:      General: She is not in acute distress.    Appearance: She is well-developed.  HENT:     Head: Normocephalic and atraumatic.  Eyes:     Conjunctiva/sclera: Conjunctivae normal.     Pupils: Pupils are equal, round, and reactive to light.  Cardiovascular:     Rate and Rhythm: Normal rate and regular rhythm.     Heart sounds: No murmur heard. Pulmonary:     Effort: Pulmonary effort is normal. No respiratory distress.     Breath sounds: Normal breath sounds. No wheezing or rales.  Abdominal:     General: There is no distension.     Palpations: Abdomen is soft.  Tenderness: There is no abdominal tenderness. There is no guarding or rebound.  Musculoskeletal:        General: No tenderness. Normal range of motion.     Cervical back: Normal range of motion and neck supple. No tenderness.     Comments: Small skin tear noted on the left hand.  Ecchymosis noted on bilateral knees but appear to be healing.  No pain with range of motion of bilateral knees, ankles or hips  Skin:    General: Skin is warm and dry.     Findings: No erythema or rash.  Neurological:     Mental Status: She is alert and oriented to person, place, and time.     Comments: 4-5 strength in the left lower extremity with some mild pronator drift.  5 out of 5 strength in the right lower extremity without drift.  Bilateral upper extremities with 5 out of 5 strength without drift.  Speech is clear   Psychiatric:        Mood and Affect: Mood normal.        Behavior: Behavior normal.     ED Results / Procedures / Treatments   Labs (all labs ordered are listed, but only abnormal results are displayed) Labs Reviewed  CBC WITH DIFFERENTIAL/PLATELET - Abnormal; Notable for the following components:      Result Value   WBC 2.7 (*)    RBC 1.33 (*)    Hemoglobin 4.2 (*)    HCT 14.9 (*)    MCV 112.0 (*)    MCHC 28.2 (*)    Platelets 47 (*)    Lymphs Abs 0.2 (*)    All other components within normal limits  COMPREHENSIVE METABOLIC PANEL - Abnormal; Notable for the following components:   Glucose, Bld 132 (*)    Calcium 8.2 (*)    Total Protein 5.8 (*)    Albumin 3.1 (*)    Total Bilirubin 1.6 (*)    All other components within normal limits  CK  CBC WITH DIFFERENTIAL/PLATELET  URINALYSIS, ROUTINE W REFLEX MICROSCOPIC    EKG EKG Interpretation  Date/Time:  Monday March 18 2022 14:24:40 EST Ventricular Rate:  64 PR Interval:  114 QRS Duration: 146 QT Interval:  480 QTC Calculation: 496 R Axis:   90 Text Interpretation: Sinus tachycardia Ventricular bigeminy Borderline short PR interval RBBB and LPFB Artifact in lead(s) I II III aVR aVL aVF V1 V2 V3 V4 V5 V6 Confirmed by Blanchie Dessert [phone]) on 03/18/2022 2:36:31 PM  Radiology CT Head Wo Contrast  Result Date: 03/18/2022 CLINICAL DATA:  Head trauma, moderate-severe EXAM: CT HEAD WITHOUT CONTRAST TECHNIQUE: Contiguous axial images were obtained from the base of the skull through the vertex without intravenous contrast. RADIATION DOSE REDUCTION: This exam was performed according to the departmental dose-optimization program which includes automated exposure control, adjustment of the mA and/or kV according to patient size and/or use of iterative reconstruction technique. COMPARISON:  03/13/2022 FINDINGS: Brain: No evidence of acute infarction, hemorrhage, hydrocephalus, extra-axial collection or mass lesion/mass  effect. Chronic right parietal and left frontal lobe infarcts. Extensive low-density changes within the periventricular and subcortical white matter compatible with chronic microvascular ischemic change. Mild diffuse cerebral volume loss. Vascular: Atherosclerotic calcifications involving the large vessels of the skull base. No unexpected hyperdense vessel. Skull: Normal. Negative for fracture or focal lesion. Sinuses/Orbits: No acute finding. Other: None. IMPRESSION: 1. No acute intracranial findings. 2. Chronic right parietal and left frontal lobe infarcts. 3. Chronic microvascular ischemic change and  cerebral volume loss. Electronically Signed   By: Davina Poke D.O.   On: 03/18/2022 15:38    Procedures Procedures    Medications Ordered in ED Medications - No data to display  ED Course/ Medical Decision Making/ A&P Clinical Course as of 03/18/22 1725  Mon Mar 18, 2022  1707 Assumed care from Dr Maryan Rued. 88 yo F with hx of who presented with multiple falls and was here recently had another fall with prolonged down time. TOC was involved bc family didn't feel comfortable taking her home. No additional complaints today. Son has been planning to house the patient. Hgb low so will repeat.  [RP]    Clinical Course User Index [RP] Fransico Meadow, MD                           Medical Decision Making Amount and/or Complexity of Data Reviewed Independent Historian: EMS External Data Reviewed: notes. Labs: ordered. Decision-making details documented in ED Course. Radiology: ordered and independent interpretation performed. Decision-making details documented in ED Course.   Pt with multiple medical problems and comorbidities and presenting today with a complaint that caries a high risk for morbidity and mortality.  Here today after another fall.  Patient has been seen multiple times in the last few weeks for recurrent falls.  She is currently in independent living and recently had TOC  involvement and is supposed to be moving in with her son as she did not have the insurance available for rehab.  Patient does have some noted weakness in the left lower extremity but otherwise a small skin tear and no other significant findings of injury.  No reproducible pain.  Vital signs are reassuring.  In the past she is noted to be hypertensive but blood pressure today is better than prior.  She does think she hit her head does not take anticoagulation but we will do a CT to ensure no evidence of intracranial hemorrhage.  We will recheck labs to ensure no acute changes. I have independently visualized and interpreted pt's images today.  Head CT is negative for acute hemorrhage.  Radiology reports chronic infarct.  Patient had her left hip scan the last time she was here which was negative and does not have any significant pain with range of motion today.  I independently interpreted patient's labs and CMP, CK are both within normal limits.  However her CBC today showed a hemoglobin of 4, pancytopenia.  This is significantly different than a CBC done just 1 month ago.  We will confirm that this is accurate and repeat a CBC.  Patient has no neck pain do not feel that she needs facial or cervical scans.  We will need to touch base with the patient's son to ensure what her social situation is after her CBC comes back.  Patient checked out to Dr. Sharlett Iles.          Final Clinical Impression(s) / ED Diagnoses Final diagnoses:  None    Rx / DC Orders ED Discharge Orders     None         Blanchie Dessert, MD 03/18/22 1731

## 2022-03-18 NOTE — ED Provider Notes (Signed)
  Physical Exam  BP (!) 172/82   Pulse 63   Temp 98.1 F (36.7 C) (Oral)   Resp 16   Ht '5\' 8"'$  (1.727 m)   Wt 66.7 kg   SpO2 98%   BMI 22.35 kg/m   Physical Exam  Procedures  Procedures  ED Course / MDM   Clinical Course as of 03/18/22 1713  Mon Mar 18, 2022  1707 Assumed care from Dr Maryan Rued. 88 yo F with hx of who presented with multiple falls and was here recently had another fall with prolonged down time. TOC was involved bc family didn't feel comfortable taking her home. No additional complaints today. Son has been planning to house the patient. Hgb low so will repeat.  [RP]    Clinical Course User Index [RP] Fransico Meadow, MD   Medical Decision Making Amount and/or Complexity of Data Reviewed Labs: ordered. Radiology: ordered.   ***

## 2022-03-18 NOTE — ED Notes (Signed)
Patient tolerated PO intake

## 2022-03-18 NOTE — Discharge Instructions (Addendum)
You were seen in the emergency department for your fall.    In the emergency department you had x-rays and CT scans that did not show any injuries.    At home, please take the antibiotics we have prescribed you for your urinary tract infection.    Check your MyChart online for the results of any tests that had not resulted by the time you left the emergency department.   Follow-up with your primary doctor in 2-3 days regarding your visit.    Return immediately to the emergency department if you experience any of the following: Fever, flank pain, or any other concerning symptoms.    Thank you for visiting our Emergency Department. It was a pleasure taking care of you today.

## 2022-03-18 NOTE — ED Triage Notes (Signed)
Patient BIB EMS coming from SNF after falling. Per EMS she last remember vomiting last night and was found this morning on floor in living room. Fall was unwitnessed, last known well was 2000 03/17/22. She has a skin tear to L hand from fall and old bruises from previous injuries. She has a HX of dementia and not on any thinners. BP- 191/84 P70 O2 99% RA and CBG: 189

## 2022-03-19 MED ORDER — METOPROLOL TARTRATE 25 MG PO TABS
50.0000 mg | ORAL_TABLET | Freq: Two times a day (BID) | ORAL | Status: DC
  Administered 2022-03-19 – 2022-03-20 (×4): 50 mg via ORAL
  Filled 2022-03-19 (×4): qty 2

## 2022-03-19 MED ORDER — FUROSEMIDE 40 MG PO TABS
80.0000 mg | ORAL_TABLET | Freq: Every day | ORAL | Status: DC
  Administered 2022-03-19 – 2022-03-20 (×2): 80 mg via ORAL
  Filled 2022-03-19 (×3): qty 2

## 2022-03-19 MED ORDER — IRBESARTAN 300 MG PO TABS
300.0000 mg | ORAL_TABLET | Freq: Every morning | ORAL | Status: DC
  Administered 2022-03-19 – 2022-03-20 (×2): 300 mg via ORAL
  Filled 2022-03-19 (×2): qty 1

## 2022-03-19 MED ORDER — LEVOTHYROXINE SODIUM 50 MCG PO TABS
75.0000 ug | ORAL_TABLET | Freq: Every day | ORAL | Status: DC
  Administered 2022-03-19 – 2022-03-20 (×2): 75 ug via ORAL
  Filled 2022-03-19 (×2): qty 1

## 2022-03-19 NOTE — ED Notes (Signed)
Patient doing well able to feed self. Ate Breakfast lunch and dinner.

## 2022-03-19 NOTE — Evaluation (Signed)
Physical Therapy Evaluation Patient Details Name: Tiffany [name] MRN: [id number] DOB: 1926/12/13 Today's Date: 03/19/2022  History of Present Illness  Patient is a 88 year old female with a history of hypertension, hypothyroidism, stroke, frequent falls who lives currently in independent living being brought in  03/18/22  after being found down.  Patient has been seen this is her fourth time in the last 2 weeks for falls. negative for fractures LLE  Clinical Impression  Patient well known to this PT from previous ED visits.  Patient  presents with significant LLE length deficit compared to right which affects gait and balance. In supine,  both legs have a "windswept" posturing to the left..  Patient assisted to chair with mod assistance, right knee flexed and ADD due to  Leg length differences.Patient  requires 1:1 for any ambulation with RW due to high fall risk( as noted in past).  Per chart patient was to move to son's home today.  Recommend WC and HHPT if that  is the plan.  Pt admitted with above diagnosis.  Pt currently with functional limitations due to the deficits listed below (see PT Problem List). Pt will benefit from skilled PT to increase their independence and safety with mobility to allow discharge to the venue listed below.        Recommendations for follow up therapy are one component of a multi-disciplinary discharge planning process, led by the attending physician.  Recommendations may be updated based on patient status, additional functional criteria and insurance authorization.  Follow Up Recommendations Home health PT (not eligigle for SNF) Can patient physically be transported by private vehicle: No    Assistance Recommended at Discharge Frequent or constant Supervision/Assistance  Patient can return home with the following  A lot of help with walking and/or transfers;A lot of help with bathing/dressing/bathroom;Assistance with cooking/housework;Assist for  transportation;Help with stairs or ramp for entrance;Direct supervision/assist for medications management    Equipment Recommendations Wheelchair cushion (measurements PT);Wheelchair (measurements PT) (ambulation is unsteady)  Recommendations for Other Services       Functional Status Assessment Patient has had a recent decline in their functional status and demonstrates the ability to make significant improvements in function in a reasonable and predictable amount of time.     Precautions / Restrictions Precautions Precautions: Fall Precaution Comments: LLE is ~ 2 inches shorter than right( ?functional vs structural)      Mobility  Bed Mobility   Bed Mobility: Supine to Sit, Sit to Supine     Supine to sit: Mod assist Sit to supine: Mod assist   General bed mobility comments: assist with trunk to sit up and assist to place both  legs back onto bed    Transfers Overall transfer level: Needs assistance Equipment used: Rolling walker (2 wheels) Transfers: Sit to/from Stand, Bed to chair/wheelchair/BSC Sit to Stand: Mod assist           General transfer comment: steady mod assist to stand from the stretcher,, Noted right knee is flexed in stance, legs tend to shift to the left, step to the chair and back to stretcher with mod assistance for balance.    Ambulation/Gait                  Stairs            Wheelchair Mobility    Modified Rankin (Stroke Patients Only)       Balance Overall balance assessment: Needs assistance, History of Falls Sitting-balance support: Bilateral upper  extremity supported, Feet supported Sitting balance-Leahy Scale: Fair     Standing balance support: Bilateral upper extremity supported, During functional activity, Reliant on assistive device for balance Standing balance-Leahy Scale: Poor Standing balance comment: able to stand at Rw to have briefs changed.,                             Pertinent  Vitals/Pain Pain Assessment Pain Assessment: No/denies pain    Home Living                     Additional Comments: chart indicates that pt was to move in with son this day  per notes, curently in Independent lliving with  caregiver but not 24/7    Prior Function Prior Level of Function : Patient poor historian/Family not available             Mobility Comments: uses a walker for ambulation.       Hand Dominance   Dominant Hand: Right    Extremity/Trunk Assessment   Upper Extremity Assessment Upper Extremity Assessment: Generalized weakness    Lower Extremity Assessment RLE Deficits / Details: strength to stand but noted  knee tends to have varus deformity with WB, knee flexed, able to lift leg from bed and flex hip and knee in supine LLE Deficits / Details: leg bears weight  and knee is straighter, able to flex hip and knee in supine, LLE id 2 " shorter when in supine,    Cervical / Trunk Assessment Cervical / Trunk Assessment: Kyphotic  Communication   Communication: HOH  Cognition Arousal/Alertness: Awake/alert Behavior During Therapy: WFL for tasks assessed/performed Overall Cognitive Status: History of cognitive impairments - at baseline                                 General Comments: pt known to this Probation officer from previous several previous ED visits, patient follows directions with increased  time, [facility]        General Comments      Exercises     Assessment/Plan    PT Assessment Patient needs continued PT services  PT Problem List Decreased strength;Decreased activity tolerance;Decreased mobility;Decreased cognition;Decreased safety awareness;Decreased balance;Decreased knowledge of use of DME;Decreased knowledge of precautions       PT Treatment Interventions DME instruction;Therapeutic activities;Cognitive remediation;Gait training;Functional mobility training;Therapeutic exercise;Patient/family education    PT Goals (Current  goals can be found in the Care Plan section)  Acute Rehab PT Goals PT Goal Formulation: Patient unable to participate in goal setting Time For Goal Achievement: 04/02/22 Potential to Achieve Goals: Fair    Frequency Min 3X/week     Co-evaluation               AM-PAC PT "6 Clicks" Mobility  Outcome Measure Help needed turning from your back to your side while in a flat bed without using bedrails?: A Lot Help needed moving from lying on your back to sitting on the side of a flat bed without using bedrails?: A Lot Help needed moving to and from a bed to a chair (including a wheelchair)?: A Lot Help needed standing up from a chair using your arms (e.g., wheelchair or bedside chair)?: A Lot Help needed to walk in hospital room?: Total Help needed climbing 3-5 steps with a railing? : Total 6 Click Score: 10    End of Session Equipment  Utilized During Treatment: Gait belt Activity Tolerance: Patient tolerated treatment well Patient left: in bed;with call bell/phone within reach Nurse Communication: Mobility status PT Visit Diagnosis: Unsteadiness on feet (R26.81);Repeated falls (R29.6);History of falling (Z91.81);Difficulty in walking, not elsewhere classified (R26.2)    Time: 2811-8867 PT Time Calculation (min) (ACUTE ONLY): 25 min   Charges:   PT Evaluation $PT Eval Low Complexity: 1 Low PT Treatments $Therapeutic Activity: 8-22 mins        Tresa Endo PT Acute Rehabilitation Services Office [phone] Weekend [phone]   Claretha Cooper 03/19/2022, 10:50 AM

## 2022-03-20 DIAGNOSIS — W19XXXA Unspecified fall, initial encounter: Secondary | ICD-10-CM | POA: Diagnosis not present

## 2022-03-20 DIAGNOSIS — R404 Transient alteration of awareness: Secondary | ICD-10-CM | POA: Diagnosis not present

## 2022-03-20 DIAGNOSIS — Z743 Need for continuous supervision: Secondary | ICD-10-CM | POA: Diagnosis not present

## 2022-03-20 DIAGNOSIS — Z7401 Bed confinement status: Secondary | ICD-10-CM | POA: Diagnosis not present

## 2022-03-20 LAB — URINE CULTURE: Culture: NO GROWTH

## 2022-03-20 NOTE — ED Provider Notes (Signed)
Emergency Medicine Observation Re-evaluation Note  Tiffany [name] is a 88 y.o. female, seen on rounds today.  Pt initially presented to the ED for complaints of Fall Currently, the patient is asleep, resting.  88 yo F who presented with multiple falls and was here recently had another fall with prolonged down time. TOC was involved bc family didn't feel comfortable taking her home.   Physical Exam  BP (!) 156/61   Pulse (!) 50   Temp 98.2 F (36.8 C)   Resp 20   Ht '5\' 8"'$  (1.727 m)   Wt 66.7 kg   SpO2 98%   BMI 22.35 kg/m  Physical Exam General: asleep, resting Cardiac: well perfused Lungs: Even and unlabored Psych: No agitation  ED Course / MDM  EKG:EKG Interpretation  Date/Time:  Monday March 18 2022 14:24:40 EST Ventricular Rate:  64 PR Interval:  114 QRS Duration: 146 QT Interval:  480 QTC Calculation: 496 R Axis:   90 Text Interpretation: Sinus tachycardia Ventricular bigeminy Borderline short PR interval RBBB and LPFB Artifact in lead(s) I II III aVR aVL aVF V1 V2 V3 V4 V5 V6 Confirmed by Blanchie Dessert [phone]) on 03/18/2022 2:36:31 PM  I have reviewed the labs performed to date as well as medications administered while in observation.  Recent changes in the last 24 hours include evaluated by PT who recommended home health PT. Notes states pt is NOT eligible for SNF. Social work engaged.  Plan  Current plan is for Social work to engage with family for dispo given PT recs.   The patient already has home health PT set up per CSW. Family contacted regarding plan for discharge home, CSW coordinating.    Regan Lemming, MD 03/20/22 1046

## 2022-03-20 NOTE — ED Notes (Signed)
Went in to room and changed bed linen, patient and put warm blanket on patient.

## 2022-03-20 NOTE — Progress Notes (Addendum)
Barrier to d/c is that no one is home to receive the pt. Her son who is POA will call this CSW this afternoon to inform when he is home so she may be d/c there.   Addend @ 3:30 PM This CSW attempted to contact the pt's son, who is POA, to inquire about whether he is home to receive his mother who is medically stable for d/c. Report given to 2nd shift social worker, [place], who will also follow up with son.

## 2022-03-20 NOTE — ED Notes (Signed)
Breakfast tray given. °

## 2022-03-20 NOTE — Progress Notes (Signed)
This CSW attempted to contact the pt's son whom is also POA. Left HIPAA compliant voicemail.

## 2022-03-20 NOTE — Progress Notes (Signed)
Transition of Care [facility]) - Emergency Department Mini Assessment   Patient Details  Name: Tiffany [name] MRN: [id number] Date of Birth: Oct 17, 1926  Transition of Care [facility]) CM/SW Contact:    Kimber Relic, LCSW Phone Number: 03/20/2022, 9:25 AM   Clinical Narrative: Pt brought In from [facility] after fall. Pt is already receiving [facility] PT/OT services with [facility]. This CSW spoke to the pt's son who states he does want her to have PT/OT services if insurance covers it. This CSW has contacted Amy with [facility] to continue [facility] PT/OT services. Amy will reach out to [facility] to coordinate scheduling the pt for a visit.    ED Mini Assessment: What brought you to the Emergency Department? : Fall  Barriers to Discharge: No Barriers Identified     Means of departure: Ambulance       Patient Contact and Communications Key Contact 1: Son (POA)   Spoke with: Son (POA) Contact Date: 03/20/22,   Contact time: 0910 Contact Phone Number: [phone] Call outcome: Pt to return home w/HH         Admission diagnosis:  fall Patient Active Problem List   Diagnosis Date Noted   Protein-calorie malnutrition, severe 01/14/2022   Foul smelling urine 01/11/2022   Dehydration 01/11/2022   Acute UTI (urinary tract infection) 01/11/2022   Hypertensive urgency 01/11/2022   Sinus bradycardia 01/11/2022   Late onset Alzheimer dementia ([place]) 11/30/2021   Malnutrition of moderate degree 38/88/2800   Acute metabolic encephalopathy 34/91/7915   Frequent falls 10/02/2020   Cerebral embolism with cerebral infarction 12/23/2017   Pressure injury of skin 12/22/2017   CKD (chronic kidney disease) stage 3, GFR 30-59 ml/min ([place]) 12/21/2017   Hypothyroidism 12/21/2017   Internal bleeding hemorrhoids 10/26/2012   HTN (hypertension) 10/04/2012   PCP:  Donald Prose, MD Pharmacy:   [facility] [id number] [place]  [address] Phone: [phone] Fax: [phone]

## 2022-03-20 NOTE — ED Notes (Signed)
Pt care taken, no complaints at this time. 

## 2022-03-22 DIAGNOSIS — N390 Urinary tract infection, site not specified: Secondary | ICD-10-CM | POA: Diagnosis not present

## 2022-03-22 DIAGNOSIS — Z23 Encounter for immunization: Secondary | ICD-10-CM | POA: Diagnosis not present

## 2022-03-22 DIAGNOSIS — R69 Illness, unspecified: Secondary | ICD-10-CM | POA: Diagnosis not present

## 2022-03-22 DIAGNOSIS — N184 Chronic kidney disease, stage 4 (severe): Secondary | ICD-10-CM | POA: Diagnosis not present

## 2022-03-22 DIAGNOSIS — R296 Repeated falls: Secondary | ICD-10-CM | POA: Diagnosis not present

## 2022-03-22 DIAGNOSIS — R600 Localized edema: Secondary | ICD-10-CM | POA: Diagnosis not present

## 2022-03-22 DIAGNOSIS — I129 Hypertensive chronic kidney disease with stage 1 through stage 4 chronic kidney disease, or unspecified chronic kidney disease: Secondary | ICD-10-CM | POA: Diagnosis not present

## 2022-03-22 DIAGNOSIS — E039 Hypothyroidism, unspecified: Secondary | ICD-10-CM | POA: Diagnosis not present

## 2022-03-22 DIAGNOSIS — M810 Age-related osteoporosis without current pathological fracture: Secondary | ICD-10-CM | POA: Diagnosis not present

## 2022-03-22 DIAGNOSIS — I700 Atherosclerosis of aorta: Secondary | ICD-10-CM | POA: Diagnosis not present

## 2022-03-22 DIAGNOSIS — Z0000 Encounter for general adult medical examination without abnormal findings: Secondary | ICD-10-CM | POA: Diagnosis not present

## 2022-03-22 DIAGNOSIS — Z8673 Personal history of transient ischemic attack (TIA), and cerebral infarction without residual deficits: Secondary | ICD-10-CM | POA: Diagnosis not present

## 2022-04-08 DIAGNOSIS — R5381 Other malaise: Secondary | ICD-10-CM | POA: Diagnosis not present

## 2022-04-08 DIAGNOSIS — N189 Chronic kidney disease, unspecified: Secondary | ICD-10-CM | POA: Diagnosis not present

## 2022-04-08 DIAGNOSIS — E1122 Type 2 diabetes mellitus with diabetic chronic kidney disease: Secondary | ICD-10-CM | POA: Diagnosis not present

## 2022-04-08 DIAGNOSIS — I10 Essential (primary) hypertension: Secondary | ICD-10-CM | POA: Diagnosis not present

## 2022-04-08 DIAGNOSIS — W19XXXA Unspecified fall, initial encounter: Secondary | ICD-10-CM | POA: Diagnosis not present

## 2022-05-08 DIAGNOSIS — R5381 Other malaise: Secondary | ICD-10-CM | POA: Diagnosis not present

## 2022-05-08 DIAGNOSIS — I10 Essential (primary) hypertension: Secondary | ICD-10-CM | POA: Diagnosis not present

## 2022-05-08 DIAGNOSIS — N189 Chronic kidney disease, unspecified: Secondary | ICD-10-CM | POA: Diagnosis not present

## 2022-05-08 DIAGNOSIS — W19XXXA Unspecified fall, initial encounter: Secondary | ICD-10-CM | POA: Diagnosis not present

## 2022-05-08 DIAGNOSIS — E1122 Type 2 diabetes mellitus with diabetic chronic kidney disease: Secondary | ICD-10-CM | POA: Diagnosis not present

## 2022-07-05 DEATH — deceased
